# Patient Record
Sex: Female | Born: 1940 | Race: White | Hispanic: No | Marital: Married | Smoking: Former smoker
Health system: Southern US, Community
[De-identification: ages and names within clinical notes are randomized; demographics above are authoritative.]

## PROBLEM LIST (undated history)

## (undated) DIAGNOSIS — B059 Measles without complication: Secondary | ICD-10-CM

## (undated) DIAGNOSIS — E663 Overweight: Secondary | ICD-10-CM

## (undated) DIAGNOSIS — M542 Cervicalgia: Secondary | ICD-10-CM

## (undated) DIAGNOSIS — R55 Syncope and collapse: Secondary | ICD-10-CM

## (undated) DIAGNOSIS — N183 Chronic kidney disease, stage 3 (moderate): Secondary | ICD-10-CM

## (undated) DIAGNOSIS — D126 Benign neoplasm of colon, unspecified: Secondary | ICD-10-CM

## (undated) DIAGNOSIS — Z9889 Other specified postprocedural states: Secondary | ICD-10-CM

## (undated) DIAGNOSIS — M5030 Other cervical disc degeneration, unspecified cervical region: Secondary | ICD-10-CM

## (undated) DIAGNOSIS — R112 Nausea with vomiting, unspecified: Secondary | ICD-10-CM

## (undated) DIAGNOSIS — E785 Hyperlipidemia, unspecified: Secondary | ICD-10-CM

## (undated) DIAGNOSIS — Z0000 Encounter for general adult medical examination without abnormal findings: Secondary | ICD-10-CM

## (undated) DIAGNOSIS — N6019 Diffuse cystic mastopathy of unspecified breast: Secondary | ICD-10-CM

## (undated) DIAGNOSIS — I10 Essential (primary) hypertension: Secondary | ICD-10-CM

## (undated) DIAGNOSIS — C801 Malignant (primary) neoplasm, unspecified: Secondary | ICD-10-CM

## (undated) DIAGNOSIS — K219 Gastro-esophageal reflux disease without esophagitis: Secondary | ICD-10-CM

## (undated) DIAGNOSIS — H269 Unspecified cataract: Secondary | ICD-10-CM

## (undated) DIAGNOSIS — B2790 Infectious mononucleosis, unspecified without complication: Secondary | ICD-10-CM

## (undated) DIAGNOSIS — B019 Varicella without complication: Secondary | ICD-10-CM

## (undated) DIAGNOSIS — B269 Mumps without complication: Secondary | ICD-10-CM

## (undated) HISTORY — DX: Hyperlipidemia, unspecified: E78.5

## (undated) HISTORY — DX: Cervicalgia: M54.2

## (undated) HISTORY — DX: Encounter for general adult medical examination without abnormal findings: Z00.00

## (undated) HISTORY — DX: Syncope and collapse: R55

## (undated) HISTORY — DX: Essential (primary) hypertension: I10

## (undated) HISTORY — DX: Varicella without complication: B01.9

## (undated) HISTORY — PX: POLYPECTOMY: SHX149

## (undated) HISTORY — DX: Infectious mononucleosis, unspecified without complication: B27.90

## (undated) HISTORY — DX: Diffuse cystic mastopathy of unspecified breast: N60.19

## (undated) HISTORY — DX: Measles without complication: B05.9

## (undated) HISTORY — DX: Malignant (primary) neoplasm, unspecified: C80.1

## (undated) HISTORY — PX: ABDOMINAL HYSTERECTOMY: SHX81

## (undated) HISTORY — PX: COLONOSCOPY: SHX174

## (undated) HISTORY — PX: VAGINAL HYSTERECTOMY: SHX2639

## (undated) HISTORY — DX: Overweight: E66.3

## (undated) HISTORY — DX: Mumps without complication: B26.9

## (undated) HISTORY — DX: Benign neoplasm of colon, unspecified: D12.6

## (undated) HISTORY — DX: Gastro-esophageal reflux disease without esophagitis: K21.9

## (undated) HISTORY — DX: Unspecified cataract: H26.9

---

## 2010-07-29 HISTORY — PX: CATARACT EXTRACTION, BILATERAL: SHX1313

## 2010-11-27 HISTORY — PX: EYE SURGERY: SHX253

## 2011-07-30 LAB — HM COLONOSCOPY

## 2011-07-30 LAB — HM PAP SMEAR

## 2012-03-29 DIAGNOSIS — D126 Benign neoplasm of colon, unspecified: Secondary | ICD-10-CM

## 2012-03-29 HISTORY — DX: Benign neoplasm of colon, unspecified: D12.6

## 2012-10-27 LAB — HM MAMMOGRAPHY

## 2013-07-07 DIAGNOSIS — G245 Blepharospasm: Secondary | ICD-10-CM | POA: Diagnosis not present

## 2013-07-07 DIAGNOSIS — I10 Essential (primary) hypertension: Secondary | ICD-10-CM | POA: Diagnosis not present

## 2013-10-11 ENCOUNTER — Other Ambulatory Visit: Payer: Self-pay | Admitting: Family Medicine

## 2013-10-11 ENCOUNTER — Ambulatory Visit: Payer: Self-pay | Admitting: Family Medicine

## 2013-10-11 ENCOUNTER — Encounter: Payer: Self-pay | Admitting: Family Medicine

## 2013-10-11 ENCOUNTER — Ambulatory Visit (INDEPENDENT_AMBULATORY_CARE_PROVIDER_SITE_OTHER): Payer: Medicare Other | Admitting: Family Medicine

## 2013-10-11 ENCOUNTER — Ambulatory Visit (HOSPITAL_BASED_OUTPATIENT_CLINIC_OR_DEPARTMENT_OTHER)
Admission: RE | Admit: 2013-10-11 | Discharge: 2013-10-11 | Disposition: A | Discharge status: Home / Self Care | Payer: Medicare Other | Source: Ambulatory Visit | Attending: Family Medicine | Admitting: Family Medicine

## 2013-10-11 ENCOUNTER — Ambulatory Visit: Payer: Self-pay | Admitting: Podiatry

## 2013-10-11 VITALS — BP 140/62 | HR 87 | Temp 98.3°F | Ht 64.0 in

## 2013-10-11 DIAGNOSIS — H269 Unspecified cataract: Secondary | ICD-10-CM | POA: Insufficient documentation

## 2013-10-11 DIAGNOSIS — M25579 Pain in unspecified ankle and joints of unspecified foot: Secondary | ICD-10-CM

## 2013-10-11 DIAGNOSIS — K219 Gastro-esophageal reflux disease without esophagitis: Secondary | ICD-10-CM

## 2013-10-11 DIAGNOSIS — Z85828 Personal history of other malignant neoplasm of skin: Secondary | ICD-10-CM | POA: Insufficient documentation

## 2013-10-11 DIAGNOSIS — R209 Unspecified disturbances of skin sensation: Secondary | ICD-10-CM | POA: Diagnosis not present

## 2013-10-11 DIAGNOSIS — R609 Edema, unspecified: Secondary | ICD-10-CM | POA: Diagnosis not present

## 2013-10-11 DIAGNOSIS — I10 Essential (primary) hypertension: Secondary | ICD-10-CM

## 2013-10-11 DIAGNOSIS — C4491 Basal cell carcinoma of skin, unspecified: Secondary | ICD-10-CM

## 2013-10-11 DIAGNOSIS — E663 Overweight: Secondary | ICD-10-CM | POA: Insufficient documentation

## 2013-10-11 DIAGNOSIS — E559 Vitamin D deficiency, unspecified: Secondary | ICD-10-CM | POA: Diagnosis not present

## 2013-10-11 DIAGNOSIS — E785 Hyperlipidemia, unspecified: Secondary | ICD-10-CM

## 2013-10-11 DIAGNOSIS — Z1231 Encounter for screening mammogram for malignant neoplasm of breast: Secondary | ICD-10-CM

## 2013-10-11 DIAGNOSIS — C801 Malignant (primary) neoplasm, unspecified: Secondary | ICD-10-CM | POA: Insufficient documentation

## 2013-10-11 DIAGNOSIS — B2790 Infectious mononucleosis, unspecified without complication: Secondary | ICD-10-CM

## 2013-10-11 LAB — LIPID PANEL
Cholesterol: 222 mg/dL — ABNORMAL HIGH (ref 0–200)
HDL: 52 mg/dL (ref >39)
LDL Cholesterol: 128 mg/dL — ABNORMAL HIGH (ref 0–99)
Total CHOL/HDL Ratio: 4.3 Ratio
Triglycerides: 209 mg/dL — ABNORMAL HIGH (ref <150)
VLDL: 42 mg/dL — ABNORMAL HIGH (ref 0–40)

## 2013-10-11 LAB — RENAL FUNCTION PANEL
Albumin: 4 g/dL (ref 3.5–5.2)
BUN: 23 mg/dL (ref 6–23)
CO2: 32 mEq/L (ref 19–32)
Calcium: 9.9 mg/dL (ref 8.4–10.5)
Chloride: 104 mEq/L (ref 96–112)
Creat: 0.91 mg/dL (ref 0.50–1.10)
Glucose, Bld: 85 mg/dL (ref 70–99)
Phosphorus: 3.6 mg/dL (ref 2.3–4.6)
Potassium: 4.1 mEq/L (ref 3.5–5.3)
Sodium: 141 mEq/L (ref 135–145)

## 2013-10-11 LAB — CBC
HCT: 39.9 % (ref 36.0–46.0)
Hemoglobin: 13.6 g/dL (ref 12.0–15.0)
MCH: 29 pg (ref 26.0–34.0)
MCHC: 34.1 g/dL (ref 30.0–36.0)
MCV: 85.1 fL (ref 78.0–100.0)
Platelets: 273 10*3/uL (ref 150–400)
RBC: 4.69 MIL/uL (ref 3.87–5.11)
RDW: 13.8 % (ref 11.5–15.5)
WBC: 7.4 10*3/uL (ref 4.0–10.5)

## 2013-10-11 LAB — HEPATIC FUNCTION PANEL
ALT: 16 U/L (ref 0–35)
AST: 18 U/L (ref 0–37)
Albumin: 4 g/dL (ref 3.5–5.2)
Alkaline Phosphatase: 55 U/L (ref 39–117)
Bilirubin, Direct: 0.1 mg/dL (ref 0.0–0.3)
Indirect Bilirubin: 0.2 mg/dL (ref 0.2–1.2)
Total Bilirubin: 0.3 mg/dL (ref 0.2–1.2)
Total Protein: 6.5 g/dL (ref 6.0–8.3)

## 2013-10-11 NOTE — Progress Notes (Signed)
Pre visit review using our clinic review tool, if applicable. No additional management support is needed unless otherwise documented below in the visit note. 

## 2013-10-11 NOTE — Patient Instructions (Signed)
Probiotics such as Digestive Advantage  DASH Diet The DASH diet stands for "Dietary Approaches to Stop Hypertension." It is a healthy eating plan that has been shown to reduce high blood pressure (hypertension) in as little as 14 days, while also possibly providing other significant health benefits. These other health benefits include reducing the risk of breast cancer after menopause and reducing the risk of type 2 diabetes, heart disease, colon cancer, and stroke. Health benefits also include weight loss and slowing kidney failure in patients with chronic kidney disease.  DIET GUIDELINES  Limit salt (sodium). Your diet should contain less than 1500 mg of sodium daily.  Limit refined or processed carbohydrates. Your diet should include mostly whole grains. Desserts and added sugars should be used sparingly.  Include small amounts of heart-healthy fats. These types of fats include nuts, oils, and tub margarine. Limit saturated and trans fats. These fats have been shown to be harmful in the body. CHOOSING FOODS  The following food groups are based on a 2000 calorie diet. See your Registered Dietitian for individual calorie needs. Grains and Grain Products (6 to 8 servings daily)  Eat More Often: Whole-wheat bread, brown rice, whole-grain or wheat pasta, quinoa, popcorn without added fat or salt (air popped).  Eat Less Often: White bread, white pasta, white rice, cornbread. Vegetables (4 to 5 servings daily)  Eat More Often: Fresh, frozen, and canned vegetables. Vegetables may be raw, steamed, roasted, or grilled with a minimal amount of fat.  Eat Less Often/Avoid: Creamed or fried vegetables. Vegetables in a cheese sauce. Fruit (4 to 5 servings daily)  Eat More Often: All fresh, canned (in natural juice), or frozen fruits. Dried fruits without added sugar. One hundred percent fruit juice ( cup [237 mL] daily).  Eat Less Often: Dried fruits with added sugar. Canned fruit in light or heavy  syrup. [employer], Fish, and Poultry (2 servings or less daily. One serving is 3 to 4 oz [85-114 g]).  Eat More Often: Ninety percent or leaner ground beef, tenderloin, sirloin. Round cuts of beef, chicken breast, [place] breast. All fish. Grill, bake, or broil your meat. Nothing should be fried.  Eat Less Often/Avoid: Fatty cuts of meat, [place], or chicken leg, thigh, or wing. Fried cuts of meat or fish. Dairy (2 to 3 servings)  Eat More Often: Low-fat or fat-free milk, low-fat plain or light yogurt, reduced-fat or part-skim cheese.  Eat Less Often/Avoid: Milk (whole, 2%).Whole milk yogurt. Full-fat cheeses. Nuts, Seeds, and Legumes (4 to 5 servings per week)  Eat More Often: All without added salt.  Eat Less Often/Avoid: Salted nuts and seeds, canned beans with added salt. Fats and Sweets (limited)  Eat More Often: Vegetable oils, tub margarines without trans fats, sugar-free gelatin. Mayonnaise and salad dressings.  Eat Less Often/Avoid: Coconut oils, palm oils, butter, stick margarine, cream, half and half, cookies, candy, pie. FOR MORE INFORMATION The Dash Diet Eating Plan: www.dashdiet.org Document Released: 07/04/2011 Document Revised: 10/07/2011 Document Reviewed: 07/04/2011 [facility] Patient Information 2014 [place], [place].

## 2013-10-12 ENCOUNTER — Telehealth: Payer: Self-pay | Admitting: Family Medicine

## 2013-10-12 LAB — TSH: TSH: 3.802 u[IU]/mL (ref 0.350–4.500)

## 2013-10-12 LAB — VITAMIN D 25 HYDROXY (VIT D DEFICIENCY, FRACTURES): Vit D, 25-Hydroxy: 52 ng/mL (ref 30–89)

## 2013-10-12 NOTE — Telephone Encounter (Signed)
Requesting lab work and Naval architect

## 2013-10-12 NOTE — Telephone Encounter (Signed)
Patient informed of xray results and informed that as soon as Dr Charlett Blake looks at her labs we will let her know

## 2013-10-13 ENCOUNTER — Encounter: Payer: Self-pay | Admitting: Family Medicine

## 2013-10-13 DIAGNOSIS — E559 Vitamin D deficiency, unspecified: Secondary | ICD-10-CM | POA: Insufficient documentation

## 2013-10-13 DIAGNOSIS — M25579 Pain in unspecified ankle and joints of unspecified foot: Secondary | ICD-10-CM | POA: Insufficient documentation

## 2013-10-13 DIAGNOSIS — C4491 Basal cell carcinoma of skin, unspecified: Secondary | ICD-10-CM | POA: Insufficient documentation

## 2013-10-13 DIAGNOSIS — E785 Hyperlipidemia, unspecified: Secondary | ICD-10-CM

## 2013-10-13 HISTORY — DX: Hyperlipidemia, unspecified: E78.5

## 2013-10-13 NOTE — Assessment & Plan Note (Signed)
Long history of foot pain s/p injury in her 32s several weeks ago she hit her foot on a chair and has worsening foot pain since then. Xray does not show an acute fracture but with her pain and deformity she is referred to ortho for further consideration. Try ice and topical treatments prn

## 2013-10-13 NOTE — Progress Notes (Signed)
Patient ID: Laura [name], female   DOB: 1940-11-21, 73 y.o.   MRN: [id number] Laura [name] [id number] 1941/05/02 10/13/2013      Progress Note-Follow Up  Subjective  Chief Complaint  Chief Complaint  Patient presents with  . Establish Care    new patient  . Foot Injury    hurt right foot on chair- last night on top of foot    HPI  Patient is a 73 year old female in today for routine medical care. The patient who is here today complaining of increased right foot pain. She has chronic pain in right foot secondary to an injury and she was a young woman but she hit it on a chair recently and is much worse than usual. She had planned to have surgical correction in the past but then moved here to [place]. No other acute complaints. No chest pain, palpitations or shortness of breath. No GI or GU concerns at this time.  Past Medical History  Diagnosis Date  . Chicken pox as a child  . Measles as a child  . Mumps as a child  . Hypertension 20 yrs ago  . 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 Cataract   . 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 Hyperlipidemia   . Overweight   . GERD (gastroesophageal reflux disease)   . Other and unspecified hyperlipidemia 10/13/2013    Past Surgical History  Procedure Laterality Date  . Vaginal hysterectomy  73 yrs old    uterus removed  . Eye surgery  11-27-2010    cataract surgery in both eyes  . 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 Abdominal hysterectomy  29 yrs ago    heavy bleeding, ovaries left in place    Family History  Problem Relation Age of Onset  . Heart disease Father   . Heart attack Father 89  . Hypertension Father   . Dementia Maternal Grandmother   . Heart disease Maternal Grandfather   . Hyperlipidemia Paternal Grandmother   . Cancer Paternal Grandfather     stomach  . Heart attack Paternal Grandfather   . Dementia Mother     History   Social History  . Marital  Status: Married    Spouse Name: N/A    Number of Children: N/A  . Years of Education: N/A   Occupational History  . Not on file.   Social History Main Topics  . Smoking status: Former Smoker -- 1.00 packs/day for 16 years    Types: Cigarettes    Start date: 07/29/1972  . Smokeless tobacco: Never Used  . Alcohol Use: Yes     Comment: special occasions gllass of wine  . Drug Use: No  . Sexual Activity: Yes   Other Topics Concern  . Not on file   Social History Narrative  . No narrative on file    No current outpatient prescriptions on file prior to visit.   No current facility-administered medications on file prior to visit.    Allergies  Allergen Reactions  . Lisinopril     dizzy  . Statins     Muscle soreness   . Tetracyclines & Related     GI upset    Review of Systems  Review of Systems  Constitutional: Negative for fever and malaise/fatigue.  HENT: Negative for congestion.   Eyes: Negative for discharge.  Respiratory: Negative for shortness of breath.  Cardiovascular: Negative for chest pain, palpitations and leg swelling.  Gastrointestinal: Negative for nausea, abdominal pain and diarrhea.  Genitourinary: Negative for dysuria.  Musculoskeletal: Positive for joint pain. Negative for falls.       Right foot pain  Skin: Negative for rash.  Neurological: Negative for loss of consciousness and headaches.  Endo/Heme/Allergies: Negative for polydipsia.  Psychiatric/Behavioral: Negative for depression and suicidal ideas. The patient is not nervous/anxious and does not have insomnia.     Objective  BP 140/62  Pulse 87  Temp(Src) 98.3 F (36.8 C) (Oral)  Ht 5\' 4"  (1.626 m)  SpO2 94%  Physical Exam Physical Exam  Constitutional: She is oriented to person, place, and time and well-developed, well-nourished, and in no distress. No distress.  HENT:  Head: Normocephalic and atraumatic.  Eyes: Conjunctivae are normal.  Neck: Neck supple. No thyromegaly  present.  Cardiovascular: Normal rate, regular rhythm and normal heart sounds.   No murmur heard. Pulmonary/Chest: Effort normal and breath sounds normal. She has no wheezes.  Abdominal: Soft. Bowel sounds are normal. She exhibits no distension and no mass.  Musculoskeletal: She exhibits no edema.  2nd toe hyperflexed with lesion on top, scar at base of 4th toe on right  Lymphadenopathy:    She has no cervical adenopathy.  Neurological: She is alert and oriented to person, place, and time.  Skin: Skin is warm and dry. No rash noted. She is not diaphoretic.  Psychiatric: Memory, affect and judgment normal.    Lab Results  Component Value Date   TSH 3.802 10/11/2013   Lab Results  Component Value Date   WBC 7.4 10/11/2013   HGB 13.6 10/11/2013   HCT 39.9 10/11/2013   MCV 85.1 10/11/2013   PLT 273 10/11/2013   Lab Results  Component Value Date   CREATININE 0.91 10/11/2013   BUN 23 10/11/2013   NA 141 10/11/2013   K 4.1 10/11/2013   CL 104 10/11/2013   CO2 32 10/11/2013   Lab Results  Component Value Date   ALT 16 10/11/2013   AST 18 10/11/2013   ALKPHOS 55 10/11/2013   BILITOT 0.3 10/11/2013   Lab Results  Component Value Date   CHOL 222* 10/11/2013   Lab Results  Component Value Date   HDL 52 10/11/2013   Lab Results  Component Value Date   LDLCALC 128* 10/11/2013   Lab Results  Component Value Date   TRIG 209* 10/11/2013   Lab Results  Component Value Date   CHOLHDL 4.3 10/11/2013     Assessment & Plan  Pain in joint, ankle and foot Long history of foot pain s/p injury in her 20s several weeks ago she hit her foot on a chair and has worsening foot pain since then. Xray does not show an acute fracture but with her pain and deformity she is referred to ortho for further consideration. Try ice and topical treatments prn  Hypertension Well controlled, no changes to meds. Encouraged heart healthy diet such as the DASH diet and exercise as tolerated.   Recurrent BCC (basal  cell carcinoma) New to the area. Referred to dermatology for surveillance  GERD (gastroesophageal reflux disease) Encouraged daily probiotics and PPIs prn  Other and unspecified hyperlipidemia Avoid trans fats, patient intolerant to statins, will request old records and repeat testing.

## 2013-10-13 NOTE — Assessment & Plan Note (Signed)
New to the area. Referred to dermatology for surveillance

## 2013-10-13 NOTE — Assessment & Plan Note (Signed)
Well controlled, no changes to meds. Encouraged heart healthy diet such as the DASH diet and exercise as tolerated.  °

## 2013-10-13 NOTE — Assessment & Plan Note (Signed)
Encouraged daily probiotics and PPIs prn

## 2013-10-13 NOTE — Assessment & Plan Note (Signed)
Avoid trans fats, patient intolerant to statins, will request old records and repeat testing.

## 2013-10-14 ENCOUNTER — Ambulatory Visit: Payer: Self-pay | Admitting: Family Medicine

## 2013-10-26 DIAGNOSIS — G5760 Lesion of plantar nerve, unspecified lower limb: Secondary | ICD-10-CM | POA: Diagnosis not present

## 2013-11-29 ENCOUNTER — Ambulatory Visit (HOSPITAL_BASED_OUTPATIENT_CLINIC_OR_DEPARTMENT_OTHER)
Admission: RE | Admit: 2013-11-29 | Discharge: 2013-11-29 | Disposition: A | Discharge status: Home / Self Care | Payer: Medicare Other | Source: Ambulatory Visit | Attending: Family Medicine | Admitting: Family Medicine

## 2013-11-29 DIAGNOSIS — Z1231 Encounter for screening mammogram for malignant neoplasm of breast: Secondary | ICD-10-CM | POA: Diagnosis not present

## 2014-01-13 ENCOUNTER — Encounter: Payer: Self-pay | Admitting: Family Medicine

## 2014-01-13 ENCOUNTER — Ambulatory Visit (INDEPENDENT_AMBULATORY_CARE_PROVIDER_SITE_OTHER): Payer: Medicare Other | Admitting: Family Medicine

## 2014-01-13 VITALS — BP 132/68 | HR 74 | Temp 98.2°F | Ht 64.0 in

## 2014-01-13 DIAGNOSIS — H547 Unspecified visual loss: Secondary | ICD-10-CM

## 2014-01-13 DIAGNOSIS — E785 Hyperlipidemia, unspecified: Secondary | ICD-10-CM

## 2014-01-13 DIAGNOSIS — N951 Menopausal and female climacteric states: Secondary | ICD-10-CM

## 2014-01-13 DIAGNOSIS — E663 Overweight: Secondary | ICD-10-CM

## 2014-01-13 DIAGNOSIS — I10 Essential (primary) hypertension: Secondary | ICD-10-CM | POA: Diagnosis not present

## 2014-01-13 DIAGNOSIS — G245 Blepharospasm: Secondary | ICD-10-CM

## 2014-01-13 DIAGNOSIS — L578 Other skin changes due to chronic exposure to nonionizing radiation: Secondary | ICD-10-CM

## 2014-01-13 DIAGNOSIS — K219 Gastro-esophageal reflux disease without esophagitis: Secondary | ICD-10-CM

## 2014-01-13 DIAGNOSIS — R232 Flushing: Secondary | ICD-10-CM

## 2014-01-13 LAB — CBC
HCT: 42.6 % (ref 36.0–46.0)
Hemoglobin: 14.6 g/dL (ref 12.0–15.0)
MCH: 29.4 pg (ref 26.0–34.0)
MCHC: 34.3 g/dL (ref 30.0–36.0)
MCV: 85.9 fL (ref 78.0–100.0)
Platelets: 266 10*3/uL (ref 150–400)
RBC: 4.96 MIL/uL (ref 3.87–5.11)
RDW: 14 % (ref 11.5–15.5)
WBC: 6 10*3/uL (ref 4.0–10.5)

## 2014-01-13 NOTE — Progress Notes (Signed)
Pre visit review using our clinic review tool, if applicable. No additional management support is needed unless otherwise documented below in the visit note. 

## 2014-01-13 NOTE — Patient Instructions (Signed)
Seborrheic Keratosis Seborrheic keratosis is a common, noncancerous (benign) skin growth that can occur anywhere on the skin.It looks like "stuck-on," waxy, rough, tan, brown, or black spots on the skin. These skin growths can be flat or raised.They are often called "barnacles" because of their pasted-on appearance.Usually, these skin growths appear in adulthood, around age 72, and increase in number as you age. They may also develop during pregnancy or following estrogen therapy. Many people may only have one growth appear in their lifetime, while some people may develop many growths. CAUSES It is unknown what causes these skin growths, but they appear to run in families. SYMPTOMS Seborrheic keratosis is often located on the face, chest, shoulders, back, or other areas. These growths are:  Usually painless, but may become irritated and itchy.  Yellow, brown, black, or other colors.  Slightly raised or have a flat surface.  Sometimes rough or wart-like in texture.  Often waxy on the surface.  Round or oval-shaped.  Sometimes "stuck-on" in appearance.  Sometimes single, but there are usually many growths. Any growth that bleeds, itches on a regular basis, becomes inflamed, or becomes irritated needs to be evaluated by a skin specialist (dermatologist). DIAGNOSIS Diagnosis is mainly based on the way the growths appear. In some cases, it can be difficult to tell this type of skin growth from skin cancer. A skin growth tissue sample (biopsy) may be used to confirm the diagnosis. TREATMENT Most often, treatment is not needed because the skin growths are benign.If the skin growth is irritated easily by clothing or jewelry, causing it to scab or bleed, treatment may be recommended. Patients may also choose to have the growths removed because they do not like their appearance. Most commonly, these growths are treated with cryosurgery. In cryosurgery, liquid nitrogen is applied to "freeze" the  growth. The growth usually falls off within a matter of days. A blister may form and dry into a scab that will also fall off. After the growth or scab falls off, it may leave a dark or light spot on the skin. This color may fade over time, or it may remain permanent on the skin. HOME CARE INSTRUCTIONS If the skin growths are treated with cryosurgery, the treated area needs to be kept clean with water and soap. SEEK MEDICAL CARE IF:  You have questions about these growths or other skin problems.  You develop new symptoms, including:  A change in the appearance of the skin growth.  New growths.  Any bleeding, itching, or pain in the growths.  A skin growth that looks similar to seborrheic keratosis. Document Released: 08/17/2010 Document Revised: 10/07/2011 Document Reviewed: 08/17/2010 [employer] Patient Information 2015 [employer]. This information is not intended to replace advice given to you by your health care provider. Make sure you discuss any questions you have with your health care provider.  

## 2014-01-14 ENCOUNTER — Telehealth: Payer: Self-pay | Admitting: Family Medicine

## 2014-01-14 LAB — HEPATIC FUNCTION PANEL
ALT: 12 U/L (ref 0–35)
AST: 15 U/L (ref 0–37)
Albumin: 4.3 g/dL (ref 3.5–5.2)
Alkaline Phosphatase: 61 U/L (ref 39–117)
Bilirubin, Direct: 0.1 mg/dL (ref 0.0–0.3)
Indirect Bilirubin: 0.3 mg/dL (ref 0.2–1.2)
Total Bilirubin: 0.4 mg/dL (ref 0.2–1.2)
Total Protein: 6.8 g/dL (ref 6.0–8.3)

## 2014-01-14 LAB — TSH: TSH: 3.038 u[IU]/mL (ref 0.350–4.500)

## 2014-01-14 LAB — LIPID PANEL
Cholesterol: 208 mg/dL — ABNORMAL HIGH (ref 0–200)
HDL: 53 mg/dL (ref >39)
LDL Cholesterol: 128 mg/dL — ABNORMAL HIGH (ref 0–99)
Total CHOL/HDL Ratio: 3.9 Ratio
Triglycerides: 134 mg/dL (ref <150)
VLDL: 27 mg/dL (ref 0–40)

## 2014-01-14 LAB — RENAL FUNCTION PANEL
Albumin: 4.3 g/dL (ref 3.5–5.2)
BUN: 15 mg/dL (ref 6–23)
CO2: 28 mEq/L (ref 19–32)
Calcium: 9.5 mg/dL (ref 8.4–10.5)
Chloride: 102 mEq/L (ref 96–112)
Creat: 0.78 mg/dL (ref 0.50–1.10)
Glucose, Bld: 84 mg/dL (ref 70–99)
Phosphorus: 3.8 mg/dL (ref 2.3–4.6)
Potassium: 4.1 mEq/L (ref 3.5–5.3)
Sodium: 140 mEq/L (ref 135–145)

## 2014-01-14 NOTE — Telephone Encounter (Signed)
Relevant patient education assigned to patient using Emmi. ° °

## 2014-01-17 DIAGNOSIS — H35379 Puckering of macula, unspecified eye: Secondary | ICD-10-CM | POA: Diagnosis not present

## 2014-01-17 LAB — ESTROGENS, TOTAL: Estrogen: 119 pg/mL

## 2014-01-23 ENCOUNTER — Encounter: Payer: Self-pay | Admitting: Family Medicine

## 2014-01-23 DIAGNOSIS — I10 Essential (primary) hypertension: Secondary | ICD-10-CM | POA: Insufficient documentation

## 2014-01-23 DIAGNOSIS — H547 Unspecified visual loss: Secondary | ICD-10-CM | POA: Insufficient documentation

## 2014-01-23 DIAGNOSIS — G245 Blepharospasm: Secondary | ICD-10-CM | POA: Insufficient documentation

## 2014-01-23 DIAGNOSIS — R232 Flushing: Secondary | ICD-10-CM | POA: Insufficient documentation

## 2014-01-23 DIAGNOSIS — L578 Other skin changes due to chronic exposure to nonionizing radiation: Secondary | ICD-10-CM | POA: Insufficient documentation

## 2014-01-23 NOTE — Assessment & Plan Note (Signed)
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

## 2014-01-23 NOTE — Assessment & Plan Note (Signed)
Encouraged heart healthy diet, increase exercise, avoid trans fats, consider a krill oil cap daily 

## 2014-01-23 NOTE — Assessment & Plan Note (Signed)
Avoid offending foods, start probiotics. Do not eat large meals in late evening and consider raising head of bed.  

## 2014-01-23 NOTE — Assessment & Plan Note (Signed)
Encouraged good sleep, heart healthy diet with small, frequent meals, lean proteins. Increase hydraiton, minimize caffeine and alcohol consider icool.

## 2014-01-23 NOTE — Assessment & Plan Note (Signed)
Well controlled, no changes to meds. Encouraged heart healthy diet such as the DASH diet and exercise as tolerated.  °

## 2014-01-23 NOTE — Progress Notes (Signed)
Patient ID: Laura [name], female   DOB: 06-Jul-1941, 73 y.o.   MRN: [id number] Celica Kotowski [id number] 19-Aug-1940 01/23/2014      Progress Note-Follow Up  Subjective  Chief Complaint  Chief Complaint  Patient presents with  . Follow-up    3 month    HPI  Patient is a 73 year old female in today for routine medical care. Patient is in today for followup. Her greatest complaint is hot flashes. She does note she took estrogen and chills she was 71 and they controlled her hot flashes well but now she's having 2-3 day period they tend to happen about the same time each evening usually in the afternoon and then again around 8 PM. She has some nausea. She notes a little more frequent lately as she has been dieting and has lost 14 pounds. No other recent illness. He is frustrated with visual loss and blepharospasm. Denies CP/palp/SOB/HA/congestion/fevers/GI or GU c/o. Taking meds as prescribed  Past Medical History  Diagnosis Date  . Chicken pox as a child  . Measles as a child  . Mumps as a child  . Hypertension 20 yrs ago  . 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 Cataract   . 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 Hyperlipidemia   . Overweight(278.02)   . GERD (gastroesophageal reflux disease)   . Other and unspecified hyperlipidemia 10/13/2013    Past Surgical History  Procedure Laterality Date  . Vaginal hysterectomy  74 yrs old    uterus removed  . Eye surgery  11-27-2010    cataract surgery in both eyes  . 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 Abdominal hysterectomy  29 yrs ago    heavy bleeding, ovaries left in place    Family History  Problem Relation Age of Onset  . Heart disease Father   . Heart attack Father 78  . Hypertension Father   . Dementia Maternal Grandmother   . Heart disease Maternal Grandfather   . Hyperlipidemia Paternal Grandmother   . Cancer Paternal Grandfather     stomach  . Heart attack Paternal  Grandfather   . Dementia Mother     History   Social History  . Marital Status: Married    Spouse Name: N/A    Number of Children: N/A  . Years of Education: N/A   Occupational History  . Not on file.   Social History Main Topics  . Smoking status: Former Smoker -- 1.00 packs/day for 16 years    Types: Cigarettes    Start date: 07/29/1972  . Smokeless tobacco: Never Used  . Alcohol Use: Yes     Comment: special occasions gllass of wine  . Drug Use: No  . Sexual Activity: Yes   Other Topics Concern  . Not on file   Social History Narrative  . No narrative on file    Current Outpatient Prescriptions on File Prior to Visit  Medication Sig Dispense Refill  . cholecalciferol (VITAMIN D) 1000 UNITS tablet Take 2,000 Units by mouth daily.      . Fiber, Guar Gum, CHEW Chew by mouth as needed.      Marland Kitchen losartan-hydrochlorothiazide (HYZAAR) 50-12.5 MG per tablet Take 1 tablet by mouth daily.       No current facility-administered medications on file prior to visit.    Allergies  Allergen Reactions  . Lisinopril     dizzy  .  Statins     Muscle soreness   . Tetracyclines & Related     GI upset    Review of Systems  Review of Systems  Constitutional: Negative for fever and malaise/fatigue.  HENT: Negative for congestion.   Eyes: Negative for discharge.  Respiratory: Negative for shortness of breath.   Cardiovascular: Negative for chest pain, palpitations and leg swelling.  Gastrointestinal: Negative for nausea, abdominal pain and diarrhea.  Genitourinary: Negative for dysuria.  Musculoskeletal: Negative for falls.  Skin: Negative for rash.  Neurological: Negative for loss of consciousness and headaches.  Endo/Heme/Allergies: Negative for polydipsia.  Psychiatric/Behavioral: Negative for depression and suicidal ideas. The patient is not nervous/anxious and does not have insomnia.     Objective  BP 132/68  Pulse 74  Temp(Src) 98.2 F (36.8 C) (Oral)  Ht 5\' 4"   (1.626 m)  SpO2 96%  Physical Exam  Physical Exam  Constitutional: She is oriented to person, place, and time and well-developed, well-nourished, and in no distress. No distress.  HENT:  Head: Normocephalic and atraumatic.  Eyes: Conjunctivae are normal.  Neck: Neck supple. No thyromegaly present.  Cardiovascular: Normal rate, regular rhythm and normal heart sounds.   No murmur heard. Pulmonary/Chest: Effort normal and breath sounds normal. She has no wheezes.  Abdominal: She exhibits no distension and no mass.  Musculoskeletal: She exhibits no edema.  Lymphadenopathy:    She has no cervical adenopathy.  Neurological: She is alert and oriented to person, place, and time.  Skin: Skin is warm and dry. No rash noted. She is not diaphoretic.  Psychiatric: Memory, affect and judgment normal.    Lab Results  Component Value Date   TSH 3.038 01/13/2014   Lab Results  Component Value Date   WBC 6.0 01/13/2014   HGB 14.6 01/13/2014   HCT 42.6 01/13/2014   MCV 85.9 01/13/2014   PLT 266 01/13/2014   Lab Results  Component Value Date   CREATININE 0.78 01/13/2014   BUN 15 01/13/2014   NA 140 01/13/2014   K 4.1 01/13/2014   CL 102 01/13/2014   CO2 28 01/13/2014   Lab Results  Component Value Date   ALT 12 01/13/2014   AST 15 01/13/2014   ALKPHOS 61 01/13/2014   BILITOT 0.4 01/13/2014   Lab Results  Component Value Date   CHOL 208* 01/13/2014   Lab Results  Component Value Date   HDL 53 01/13/2014   Lab Results  Component Value Date   LDLCALC 128* 01/13/2014   Lab Results  Component Value Date   TRIG 134 01/13/2014   Lab Results  Component Value Date   CHOLHDL 3.9 01/13/2014     Assessment & Plan  Hypertension Well controlled, no changes to meds. Encouraged heart healthy diet such as the DASH diet and exercise as tolerated.   Overweight Encouraged DASH diet, decrease po intake and increase exercise as tolerated. Needs 7-8 hours of sleep nightly. Avoid trans fats, eat small,  frequent meals every 4-5 hours with lean proteins, complex carbs and healthy fats. 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  GERD (gastroesophageal reflux disease) Avoid offending foods, start probiotics. Do not eat large meals in late evening and consider raising head of bed.   Hot flashes Encouraged good sleep, heart healthy diet  with small, frequent meals, lean proteins. Increase hydraiton, minimize caffeine and alcohol consider icool.

## 2014-01-23 NOTE — Assessment & Plan Note (Signed)
Encouraged DASH diet, decrease po intake and increase exercise as tolerated. Needs 7-8 hours of sleep nightly. Avoid trans fats, eat small, frequent meals every 4-5 hours with lean proteins, complex carbs and healthy fats. Minimize simple carbs 

## 2014-01-23 NOTE — Assessment & Plan Note (Signed)
Referred to dermatology for further consideration.  

## 2014-02-02 DIAGNOSIS — D235 Other benign neoplasm of skin of trunk: Secondary | ICD-10-CM | POA: Diagnosis not present

## 2014-02-23 ENCOUNTER — Telehealth: Payer: Self-pay | Admitting: *Deleted

## 2014-02-23 NOTE — Telephone Encounter (Signed)
Pt left message requesting estrogen results.  Please advise.

## 2014-02-23 NOTE — Telephone Encounter (Signed)
Estrogen level 119, non diagnostic, could be mid cycle or post menopause.

## 2014-02-25 NOTE — Telephone Encounter (Signed)
Left detailed message and to call if further questions.

## 2014-03-14 DIAGNOSIS — H11439 Conjunctival hyperemia, unspecified eye: Secondary | ICD-10-CM | POA: Diagnosis not present

## 2014-03-14 DIAGNOSIS — G245 Blepharospasm: Secondary | ICD-10-CM | POA: Diagnosis not present

## 2014-04-11 DIAGNOSIS — G245 Blepharospasm: Secondary | ICD-10-CM | POA: Diagnosis not present

## 2014-04-11 DIAGNOSIS — H02429 Myogenic ptosis of unspecified eyelid: Secondary | ICD-10-CM | POA: Diagnosis not present

## 2014-04-14 ENCOUNTER — Ambulatory Visit (INDEPENDENT_AMBULATORY_CARE_PROVIDER_SITE_OTHER): Payer: Medicare Other | Admitting: Family Medicine

## 2014-04-14 ENCOUNTER — Encounter: Payer: Self-pay | Admitting: Family Medicine

## 2014-04-14 VITALS — BP 130/79 | HR 85 | Temp 98.3°F | Ht 64.0 in

## 2014-04-14 DIAGNOSIS — N951 Menopausal and female climacteric states: Secondary | ICD-10-CM

## 2014-04-14 DIAGNOSIS — E785 Hyperlipidemia, unspecified: Secondary | ICD-10-CM

## 2014-04-14 DIAGNOSIS — K219 Gastro-esophageal reflux disease without esophagitis: Secondary | ICD-10-CM | POA: Diagnosis not present

## 2014-04-14 DIAGNOSIS — R232 Flushing: Secondary | ICD-10-CM

## 2014-04-14 DIAGNOSIS — E663 Overweight: Secondary | ICD-10-CM | POA: Diagnosis not present

## 2014-04-14 DIAGNOSIS — I10 Essential (primary) hypertension: Secondary | ICD-10-CM | POA: Diagnosis not present

## 2014-04-14 NOTE — Assessment & Plan Note (Signed)
Occuring at 8:45 am. 9:30 pm and midnight, might have another one or two during the day, try more protein based snacks, she not eaten in quite some time when occurs

## 2014-04-14 NOTE — Progress Notes (Signed)
Pre visit review using our clinic review tool, if applicable. No additional management support is needed unless otherwise documented below in the visit note. 

## 2014-04-14 NOTE — Progress Notes (Signed)
Patient ID: Laura [name], female   DOB: 1940-11-11, 73 y.o.   MRN: [id number] Laura [name] [id number] [birth year] 04/14/2014      Progress Note-Follow Up  Subjective  Chief Complaint  Chief Complaint  Patient presents with  . Follow-up    3 month    HPI  Patient is a 73 year old female in today for routine medical care. Patient is followup. Overall feeling well. Has been trying to lose weight for the last 2 months and says she's lost 18 pounds. She will not way here but she notes she weighed elsewhere discovered she had gained weight. She has been struggling with some right foot pain since April but is being care for at [place]. 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 Denies CP/palp/SOB/HA/congestion/fevers/GI or GU c/o. Taking meds as prescribed  Past Medical History  Diagnosis Date  . Chicken pox as a child  . Measles as a child  . Mumps as a child  . Hypertension 20 yrs ago  . 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 Cataract   . 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 Hyperlipidemia   . Overweight(278.02)   . GERD (gastroesophageal reflux disease)   . Other and unspecified hyperlipidemia 10/13/2013    Past Surgical History  Procedure Laterality Date  . Vaginal hysterectomy  73 yrs old    uterus removed  . Eye surgery  11-27-2010    cataract surgery in both eyes  . 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 Abdominal hysterectomy  29 yrs ago    heavy bleeding, ovaries left in place    Family History  Problem Relation Age of Onset  . Heart disease Father   . Heart attack Father 81  . Hypertension Father   . Dementia Maternal Grandmother   . Heart disease Maternal Grandfather   . Hyperlipidemia Paternal Grandmother   . Cancer Paternal Grandfather     stomach  . Heart attack Paternal Grandfather   . Dementia Mother     History   Social History  . Marital  Status: Married    Spouse Name: N/A    Number of Children: N/A  . Years of Education: N/A   Occupational History  . Not on file.   Social History Main Topics  . Smoking status: Former Smoker -- 1.00 packs/day for 16 years    Types: Cigarettes    Start date: 07/29/1972  . Smokeless tobacco: Never Used  . Alcohol Use: Yes     Comment: special occasions gllass of wine  . Drug Use: No  . Sexual Activity: Yes   Other Topics Concern  . Not on file   Social History Narrative  . No narrative on file    Current Outpatient Prescriptions on File Prior to Visit  Medication Sig Dispense Refill  . cholecalciferol (VITAMIN D) 1000 UNITS tablet Take 2,000 Units by mouth daily.      . Fiber, Guar Gum, CHEW Chew by mouth as needed.      Marland Kitchen losartan-hydrochlorothiazide (HYZAAR) 50-12.5 MG per tablet Take 1 tablet by mouth daily.       No current facility-administered medications on file prior to visit.    Allergies  Allergen Reactions  . Lisinopril     dizzy  . Statins     Muscle soreness   . Tetracyclines & Related  GI upset    Review of Systems  Review of Systems  Constitutional: Negative for fever and malaise/fatigue.  HENT: Negative for congestion.   Eyes: Negative for discharge.  Respiratory: Negative for shortness of breath.   Cardiovascular: Negative for chest pain, palpitations and leg swelling.  Gastrointestinal: Negative for nausea, abdominal pain and diarrhea.  Genitourinary: Negative for dysuria.  Musculoskeletal: Positive for joint pain. Negative for falls.       Right foot pain, responded to steroid injection in the spring but is beginning to hurt a gain. Sees [facility] [facility]  Skin: Negative for rash.  Neurological: Negative for loss of consciousness and headaches.  Endo/Heme/Allergies: Negative for polydipsia.  Psychiatric/Behavioral: Negative for depression and suicidal ideas. The patient is not nervous/anxious and does not have insomnia.     Objective  BP  130/79  Pulse 85  Temp(Src) 98.3 F (36.8 C) (Oral)  Ht 5\' 4"  (1.626 m)  SpO2 99%  Physical Exam  Physical Exam  Constitutional: She is oriented to person, place, and time and well-developed, well-nourished, and in no distress. No distress.  HENT:  Head: Normocephalic and atraumatic.  Eyes: Conjunctivae are normal.  Neck: Neck supple. No thyromegaly present.  Cardiovascular: Normal rate, regular rhythm and normal heart sounds.   No murmur heard. Pulmonary/Chest: Effort normal and breath sounds normal. She has no wheezes.  Abdominal: She exhibits no distension and no mass.  Musculoskeletal: She exhibits no edema.  Lymphadenopathy:    She has no cervical adenopathy.  Neurological: She is alert and oriented to person, place, and time.  Skin: Skin is warm and dry. No rash noted. She is not diaphoretic.  Psychiatric: Memory, affect and judgment normal.    Lab Results  Component Value Date   TSH 3.038 01/13/2014   Lab Results  Component Value Date   WBC 6.0 01/13/2014   HGB 14.6 01/13/2014   HCT 42.6 01/13/2014   MCV 85.9 01/13/2014   PLT 266 01/13/2014   Lab Results  Component Value Date   CREATININE 0.78 01/13/2014   BUN 15 01/13/2014   NA 140 01/13/2014   K 4.1 01/13/2014   CL 102 01/13/2014   CO2 28 01/13/2014   Lab Results  Component Value Date   ALT 12 01/13/2014   AST 15 01/13/2014   ALKPHOS 61 01/13/2014   BILITOT 0.4 01/13/2014   Lab Results  Component Value Date   CHOL 208* 01/13/2014   Lab Results  Component Value Date   HDL 53 01/13/2014   Lab Results  Component Value Date   LDLCALC 128* 01/13/2014   Lab Results  Component Value Date   TRIG 134 01/13/2014   Lab Results  Component Value Date   CHOLHDL 3.9 01/13/2014     Assessment & Plan  Hot flashes Occuring at 8:45 am. 9:30 pm and midnight, might have another one or two during the day, try more protein based snacks, she not eaten in quite some time when occurs  Overweight Encouraged DASH diet,  decrease po intake and increase exercise as tolerated. Needs 7-8 hours of sleep nightly. Avoid trans fats, eat small, frequent meals every 4-5 hours with lean proteins, complex carbs and healthy fats. Minimize simple carbs, GMO foods.  Hypertension Well controlled, no changes to meds. Encouraged heart healthy diet such as the DASH diet and exercise as tolerated.   GERD (gastroesophageal reflux disease) Avoid offending foods, start probiotics. Do not eat large meals in late evening and consider raising head of bed.  Other and unspecified hyperlipidemia Encouraged heart healthy diet, increase exercise, avoid trans fats, consider a krill oil cap daily

## 2014-04-14 NOTE — Patient Instructions (Signed)
ICool daily, [employer], [employer]  Add a protein based snack day and night  Cholesterol Cholesterol is a white, waxy, fat-like substance needed by your body in small amounts. The liver makes all the cholesterol you need. Cholesterol is carried from the liver by the blood through the blood vessels. Deposits of cholesterol (plaque) may build up on blood vessel walls. These make the arteries narrower and stiffer. Cholesterol plaques increase the risk for heart attack and stroke.  You cannot feel your cholesterol level even if it is very high. The only way to know it is high is with a blood test. Once you know your cholesterol levels, you should keep a record of the test results. Work with your health care provider to keep your levels in the desired range.  WHAT DO THE RESULTS MEAN?  Total cholesterol is a rough measure of all the cholesterol in your blood.   LDL is the so-called bad cholesterol. This is the type that deposits cholesterol in the walls of the arteries. You want this level to be low.   HDL is the good cholesterol because it cleans the arteries and carries the LDL away. You want this level to be high.  Triglycerides are fat that the body can either burn for energy or store. High levels are closely linked to heart disease.  WHAT ARE THE DESIRED LEVELS OF CHOLESTEROL?  Total cholesterol below 200.   LDL below 100 for people at risk, below 70 for those at very high risk.   HDL above 50 is good, above 60 is best.   Triglycerides below 150.  HOW CAN I LOWER MY CHOLESTEROL?  Diet. Follow your diet programs as directed by your health care provider.   Choose fish or white meat chicken and [place], roasted or baked. Limit fatty cuts of red meat, fried foods, and processed meats, such as sausage and lunch meats.   Eat lots of fresh fruits and vegetables.  Choose whole grains, beans, pasta, potatoes, and cereals.   Use only small amounts of olive, corn, or canola oils.    Avoid butter, mayonnaise, shortening, or palm kernel oils.  Avoid foods with trans fats.   Drink skim or nonfat milk and eat low-fat or nonfat yogurt and cheeses. Avoid whole milk, cream, ice cream, egg yolks, and full-fat cheeses.   Healthy desserts include angel food cake, ginger snaps, animal crackers, hard candy, popsicles, and low-fat or nonfat frozen yogurt. Avoid pastries, cakes, pies, and cookies.   Exercise. Follow your exercise programs as directed by your health care provider.   A regular program helps decrease LDL and raise HDL.   A regular program helps with weight control.   Do things that increase your activity level like gardening, walking, or taking the stairs. Ask your health care provider about how you can be more active in your daily life.   Medicine. Take medicine only as directed by your health care provider.   Medicine may be prescribed by your health care provider to help lower cholesterol and decrease the risk for heart disease.   If you have several risk factors, you may need medicine even if your levels are normal. Document Released: 04/09/2001 Document Revised: 11/29/2013 Document Reviewed: 04/28/2013 [facility] Patient Information 2015 [place], [place]. This information is not intended to replace advice given to you by your health care provider. Make sure you discuss any questions you have with your health care provider.

## 2014-04-17 NOTE — Assessment & Plan Note (Signed)
Encouraged DASH diet, decrease po intake and increase exercise as tolerated. Needs 7-8 hours of sleep nightly. Avoid trans fats, eat small, frequent meals every 4-5 hours with lean proteins, complex carbs and healthy fats. Minimize simple carbs, GMO foods. 

## 2014-04-17 NOTE — Assessment & Plan Note (Signed)
Avoid offending foods, start probiotics. Do not eat large meals in late evening and consider raising head of bed.  

## 2014-04-17 NOTE — Assessment & Plan Note (Signed)
Encouraged heart healthy diet, increase exercise, avoid trans fats, consider a krill oil cap daily 

## 2014-04-17 NOTE — Assessment & Plan Note (Signed)
Well controlled, no changes to meds. Encouraged heart healthy diet such as the DASH diet and exercise as tolerated.  °

## 2014-04-18 ENCOUNTER — Encounter: Payer: Self-pay | Admitting: Family Medicine

## 2014-04-18 ENCOUNTER — Ambulatory Visit (INDEPENDENT_AMBULATORY_CARE_PROVIDER_SITE_OTHER): Payer: Medicare Other | Admitting: Family Medicine

## 2014-04-18 VITALS — BP 147/81 | HR 71 | Temp 97.6°F

## 2014-04-18 DIAGNOSIS — J069 Acute upper respiratory infection, unspecified: Secondary | ICD-10-CM | POA: Diagnosis not present

## 2014-04-18 NOTE — Patient Instructions (Signed)
Buy OTC generic allegra 60 mg tab twice a day as needed. Also either robitussin DM or mucinex DM can be taken as needed as well. Continue ibuprofen and acetaminophen as needed for headache.

## 2014-04-18 NOTE — Progress Notes (Signed)
Pre visit review using our clinic review tool, if applicable. No additional management support is needed unless otherwise documented below in the visit note. 

## 2014-04-18 NOTE — Progress Notes (Signed)
OFFICE NOTE  04/18/2014  CC: URI sx's.  HPI: Patient is a 73 y.o. Caucasian female who is here for HA, facial/nasal drainage, some PND cough. No fevers.  HA is peri-orbital/symmetric.  Had some pain in upper teeth region this morning. Taking lots of ibuprofen.  Uses no nasal sprays, no OTC meds tried.  Pertinent PMH:  Past medical, surgical, social, and family history reviewed and no changes are noted since last office visit.  MEDS:  Outpatient Prescriptions Prior to Visit  Medication Sig Dispense Refill  . cholecalciferol (VITAMIN D) 1000 UNITS tablet Take 2,000 Units by mouth daily.      . Fiber, Guar Gum, CHEW Chew by mouth as needed.      Marland Kitchen losartan-hydrochlorothiazide (HYZAAR) 50-12.5 MG per tablet Take 1 tablet by mouth daily.       No facility-administered medications prior to visit.    PE: Blood pressure 147/81, pulse 71, temperature 97.6 F (36.4 C), temperature source Oral, weight 0 lb (0 kg), SpO2 97.00%. BP recheck w/manual cuff right arm 132/82 VS: noted--normal. Gen: alert, NAD, NONTOXIC APPEARING. HEENT: eyes without injection, drainage, or swelling.  Ears: EACs clear, TMs with normal light reflex and landmarks.  Nose: Clear rhinorrhea, with some dried, crusty exudate adherent to mildly injected mucosa.  No purulent d/c.  No paranasal sinus TTP.  No facial swelling.  Throat and mouth without focal lesion.  No pharyngial swelling, erythema, or exudate.   Neck: supple, no LAD.   LUNGS: CTA bilat, nonlabored resps.   CV: RRR, no m/r/g. EXT: no c/c/e SKIN: no rash  IMPRESSION AND PLAN:  Viral URI, symptomatic care discussed. Avoid decongestants due to HTN. Buy OTC generic allegra 60 mg tab twice a day as needed. Also either robitussin DM or mucinex DM can be taken as needed as well. Continue ibuprofen and acetaminophen as needed for headache.  An After Visit Summary was printed and given to the patient.  FOLLOW UP: prn

## 2014-04-29 ENCOUNTER — Ambulatory Visit (INDEPENDENT_AMBULATORY_CARE_PROVIDER_SITE_OTHER): Payer: Medicare Other | Admitting: Family Medicine

## 2014-04-29 ENCOUNTER — Encounter: Payer: Self-pay | Admitting: Family Medicine

## 2014-04-29 VITALS — BP 137/66 | HR 71 | Temp 98.1°F | Ht 64.0 in

## 2014-04-29 DIAGNOSIS — N951 Menopausal and female climacteric states: Secondary | ICD-10-CM

## 2014-04-29 DIAGNOSIS — R232 Flushing: Secondary | ICD-10-CM

## 2014-04-29 DIAGNOSIS — Z23 Encounter for immunization: Secondary | ICD-10-CM

## 2014-04-29 DIAGNOSIS — N649 Disorder of breast, unspecified: Secondary | ICD-10-CM

## 2014-04-29 DIAGNOSIS — I10 Essential (primary) hypertension: Secondary | ICD-10-CM | POA: Diagnosis not present

## 2014-04-29 NOTE — Progress Notes (Signed)
Pre visit review using our clinic review tool, if applicable. No additional management support is needed unless otherwise documented below in the visit note. 

## 2014-04-29 NOTE — Patient Instructions (Signed)
Fibrocystic Breast Changes Fibrocystic breast changes occur when breast ducts become blocked, causing painful, fluid-filled lumps (cysts) to form in the breast. This is a common condition that is noncancerous (benign). It occurs when women go through hormonal changes during their menstrual cycle. Fibrocystic breast changes can affect one or both breasts. CAUSES  The exact cause of fibrocystic breast changes is not known, but it may be related to the female hormones estrogen and progesterone. Family traits that get passed from parent to child (genetics) may also be a factor in some cases. SIGNS AND SYMPTOMS   Tenderness, mild discomfort, or pain.   Swelling.   Ropelike feeling when touching the breast.   Lumpy breast, one or both sides.   Changes in breast size, especially before (larger) and after (smaller) the menstrual period.   Green or dark brown nipple discharge (not blood).  Symptoms are usually worse before menstrual periods start and get better toward the end of the menstrual period.  DIAGNOSIS  To make a diagnosis, your health care provider will ask you questions and perform a physical exam of your breasts. The health care provider may recommend other tests that can examine inside your breasts, such as:  A breast X-ray (mammogram).   Ultrasonography.  An MRI.  If something more than fibrocystic breast changes is suspected, your health care provider may take a breast tissue sample (breast biopsy) to examine. TREATMENT  Often, treatment is not needed. Your health care provider may recommend over-the-counter pain relievers to help lessen pain or discomfort caused by the fibrocystic breast changes. You may also be asked to change your diet to limit or stop eating foods or drinking beverages that contain caffeine. Foods and beverages that contain caffeine include chocolate, soda, coffee, and tea. Reducing sugar and fat in your diet may also help. Your health care provider may  also recommend:  Fine needle aspiration to remove fluid from a cyst that is causing pain.   Surgery to remove a large, persistent, and tender cyst. HOME CARE INSTRUCTIONS   Examine your breasts after every menstrual period. If you do not have menstrual periods, check your breasts the first day of every month. Feel for changes, such as more tenderness, a new growth, a change in breast size, or a change in a lump that has always been there.   Only take over-the-counter or prescription medicine as directed by your health care provider.   Wear a well-fitted support or sports bra, especially when exercising.   Decrease or avoid caffeine, fat, and sugar in your diet as directed by your health care provider.  SEEK MEDICAL CARE IF:   You have fluid leaking (discharge) from your nipples, especially bloody discharge.   You have new lumps or bumps in the breast.   Your breast or breasts become enlarged, red, and painful.   You have areas of your breast that pucker in.   Your nipples appear flat or indented.  Document Released: 05/01/2006 Document Revised: 07/20/2013 Document Reviewed: 01/03/2013 [employer] Patient Information 2015 [employer]. This information is not intended to replace advice given to you by your health care provider. Make sure you discuss any questions you have with your health care provider.  

## 2014-05-01 ENCOUNTER — Encounter: Payer: Self-pay | Admitting: Family Medicine

## 2014-05-01 DIAGNOSIS — N6019 Diffuse cystic mastopathy of unspecified breast: Secondary | ICD-10-CM | POA: Insufficient documentation

## 2014-05-01 HISTORY — DX: Diffuse cystic mastopathy of unspecified breast: N60.19

## 2014-05-01 NOTE — Assessment & Plan Note (Signed)
stable °

## 2014-05-01 NOTE — Progress Notes (Signed)
Patient ID: Laura [name], female   DOB: 06/28/1941, 73 y.o.   MRN: [id number] Laura [name] [id number] 17-Mar-1941 05/01/2014      Progress Note-Follow Up  Subjective  Chief Complaint  Chief Complaint  Patient presents with  . Breast Mass    right  . Injections    prevnar and flu    HPI  Patient is a 73 year old female in today for routine medical care. Noting a lesion in her right breast for about a week to 2. She notes it was not warm and there was no trauma. It was not tender. There was no discharge. She reports otherwise she feels well. She denies anorexia, chest pain, palpitations or shortness of breath. She is a long history of hot flashes they are not worse than her baseline and were actually somewhat improved while down in [place].  Past Medical History  Diagnosis Date  . Chicken pox as a child  . Measles as a child  . Mumps as a child  . Hypertension 20 yrs ago  . 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 Cataract   . 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 Hyperlipidemia   . Overweight(278.02)   . GERD (gastroesophageal reflux disease)   . Other and unspecified hyperlipidemia 10/13/2013    Past Surgical History  Procedure Laterality Date  . Vaginal hysterectomy  73 yrs old    uterus removed  . Eye surgery  11-27-2010    cataract surgery in both eyes  . 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 Abdominal hysterectomy  29 yrs ago    heavy bleeding, ovaries left in place    Family History  Problem Relation Age of Onset  . Heart disease Father   . Heart attack Father 80  . Hypertension Father   . Dementia Maternal Grandmother   . Heart disease Maternal Grandfather   . Hyperlipidemia Paternal Grandmother   . Cancer Paternal Grandfather     stomach  . Heart attack Paternal Grandfather   . Dementia Mother     History   Social History  . Marital Status: Married    Spouse Name: N/A    Number of Children: N/A  . Years  of Education: N/A   Occupational History  . Not on file.   Social History Main Topics  . Smoking status: Former Smoker -- 1.00 packs/day for 16 years    Types: Cigarettes    Start date: 07/29/1972  . Smokeless tobacco: Never Used  . Alcohol Use: Yes     Comment: special occasions gllass of wine  . Drug Use: No  . Sexual Activity: Yes   Other Topics Concern  . Not on file   Social History Narrative  . No narrative on file    Current Outpatient Prescriptions on File Prior to Visit  Medication Sig Dispense Refill  . cholecalciferol (VITAMIN D) 1000 UNITS tablet Take 2,000 Units by mouth daily.      . Fiber, Guar Gum, CHEW Chew by mouth as needed.      Marland Kitchen losartan-hydrochlorothiazide (HYZAAR) 50-12.5 MG per tablet Take 1 tablet by mouth daily.       No current facility-administered medications on file prior to visit.    Allergies  Allergen Reactions  . Lisinopril     dizzy  . Statins     Muscle soreness   . Tetracyclines & Related  GI upset    Review of Systems  Review of Systems  Constitutional: Negative for fever and malaise/fatigue.  HENT: Negative for congestion.   Eyes: Negative for discharge.  Respiratory: Negative for shortness of breath.   Cardiovascular: Negative for chest pain, palpitations and leg swelling.  Gastrointestinal: Negative for nausea, abdominal pain and diarrhea.  Genitourinary: Negative for dysuria.  Musculoskeletal: Negative for falls.  Skin: Negative for rash.  Neurological: Negative for loss of consciousness and headaches.  Endo/Heme/Allergies: Negative for polydipsia.  Psychiatric/Behavioral: Negative for depression and suicidal ideas. The patient is not nervous/anxious and does not have insomnia.     Objective  BP 137/66  Pulse 71  Temp(Src) 98.1 F (36.7 C) (Oral)  Ht 5\' 4"  (1.626 m)  SpO2 98%  Physical Exam  Physical Exam  Constitutional: She is oriented to person, place, and time and well-developed, well-nourished,  and in no distress. No distress.  HENT:  Head: Normocephalic and atraumatic.  Eyes: Conjunctivae are normal.  Neck: Neck supple. No thyromegaly present.  Cardiovascular: Normal rate, regular rhythm and normal heart sounds.   No murmur heard. Pulmonary/Chest: Effort normal and breath sounds normal. She has no wheezes.  Abdominal: She exhibits no distension and no mass.  Genitourinary:  right breast 1 x 3 cm lesion, mildly tender at  10 oclock in right breast. Dense tissue noted b/l throughout. No skin changes or discharge.   Musculoskeletal: She exhibits no edema.  Lymphadenopathy:    She has no cervical adenopathy.  Neurological: She is alert and oriented to person, place, and time.  Skin: Skin is warm and dry. No rash noted. She is not diaphoretic.  Psychiatric: Memory, affect and judgment normal.    Lab Results  Component Value Date   TSH 3.038 01/13/2014   Lab Results  Component Value Date   WBC 6.0 01/13/2014   HGB 14.6 01/13/2014   HCT 42.6 01/13/2014   MCV 85.9 01/13/2014   PLT 266 01/13/2014   Lab Results  Component Value Date   CREATININE 0.78 01/13/2014   BUN 15 01/13/2014   NA 140 01/13/2014   K 4.1 01/13/2014   CL 102 01/13/2014   CO2 28 01/13/2014   Lab Results  Component Value Date   ALT 12 01/13/2014   AST 15 01/13/2014   ALKPHOS 61 01/13/2014   BILITOT 0.4 01/13/2014   Lab Results  Component Value Date   CHOL 208* 01/13/2014   Lab Results  Component Value Date   HDL 53 01/13/2014   Lab Results  Component Value Date   LDLCALC 128* 01/13/2014   Lab Results  Component Value Date   TRIG 134 01/13/2014   Lab Results  Component Value Date   CHOLHDL 3.9 01/13/2014     Assessment & Plan  HTN (hypertension) Well controlled, no changes to meds. Encouraged heart healthy diet such as the DASH diet and exercise as tolerated.   Hot flashes stable  Breast lesion Right breast 1x3 cm at 10 oclcock mildly tender, will proceed with imaging

## 2014-05-01 NOTE — Assessment & Plan Note (Signed)
Well controlled, no changes to meds. Encouraged heart healthy diet such as the DASH diet and exercise as tolerated.  °

## 2014-05-01 NOTE — Assessment & Plan Note (Signed)
Right breast 1x3 cm at 10 oclcock mildly tender, will proceed with imaging

## 2014-05-02 ENCOUNTER — Other Ambulatory Visit: Payer: Self-pay | Admitting: Family Medicine

## 2014-05-02 DIAGNOSIS — N649 Disorder of breast, unspecified: Secondary | ICD-10-CM

## 2014-05-03 ENCOUNTER — Ambulatory Visit: Payer: Medicare Other

## 2014-05-05 ENCOUNTER — Ambulatory Visit
Admission: RE | Admit: 2014-05-05 | Discharge: 2014-05-05 | Disposition: A | Discharge status: Home / Self Care | Payer: Medicare Other | Source: Ambulatory Visit | Attending: Family Medicine | Admitting: Family Medicine

## 2014-05-05 DIAGNOSIS — N649 Disorder of breast, unspecified: Secondary | ICD-10-CM

## 2014-05-05 DIAGNOSIS — N6489 Other specified disorders of breast: Secondary | ICD-10-CM | POA: Diagnosis not present

## 2014-05-23 DIAGNOSIS — H53483 Generalized contraction of visual field, bilateral: Secondary | ICD-10-CM | POA: Diagnosis not present

## 2014-06-01 ENCOUNTER — Other Ambulatory Visit: Payer: Self-pay | Admitting: Family Medicine

## 2014-07-07 ENCOUNTER — Other Ambulatory Visit: Payer: Medicare Other

## 2014-07-08 ENCOUNTER — Other Ambulatory Visit (INDEPENDENT_AMBULATORY_CARE_PROVIDER_SITE_OTHER): Payer: Medicare Other

## 2014-07-08 DIAGNOSIS — E785 Hyperlipidemia, unspecified: Secondary | ICD-10-CM | POA: Diagnosis not present

## 2014-07-08 DIAGNOSIS — I10 Essential (primary) hypertension: Secondary | ICD-10-CM | POA: Diagnosis not present

## 2014-07-08 LAB — CBC WITH DIFFERENTIAL/PLATELET
Basophils Absolute: 0 10*3/uL (ref 0.0–0.1)
Basophils Relative: 0.5 % (ref 0.0–3.0)
Eosinophils Absolute: 0.2 10*3/uL (ref 0.0–0.7)
Eosinophils Relative: 2.6 % (ref 0.0–5.0)
HCT: 42.7 % (ref 36.0–46.0)
Hemoglobin: 14.1 g/dL (ref 12.0–15.0)
Lymphocytes Relative: 34.2 % (ref 12.0–46.0)
Lymphs Abs: 2 10*3/uL (ref 0.7–4.0)
MCHC: 32.9 g/dL (ref 30.0–36.0)
MCV: 89.1 fl (ref 78.0–100.0)
Monocytes Absolute: 0.4 10*3/uL (ref 0.1–1.0)
Monocytes Relative: 7.4 % (ref 3.0–12.0)
Neutro Abs: 3.2 10*3/uL (ref 1.4–7.7)
Neutrophils Relative %: 55.3 % (ref 43.0–77.0)
Platelets: 265 10*3/uL (ref 150.0–400.0)
RBC: 4.8 Mil/uL (ref 3.87–5.11)
RDW: 13.6 % (ref 11.5–15.5)
WBC: 5.8 10*3/uL (ref 4.0–10.5)

## 2014-07-08 LAB — RENAL FUNCTION PANEL
Albumin: 3.9 g/dL (ref 3.5–5.2)
BUN: 22 mg/dL (ref 6–23)
CO2: 27 mEq/L (ref 19–32)
Calcium: 9.5 mg/dL (ref 8.4–10.5)
Chloride: 105 mEq/L (ref 96–112)
Creatinine, Ser: 0.9 mg/dL (ref 0.4–1.2)
GFR: 69.85 mL/min (ref >60.00)
Glucose, Bld: 86 mg/dL (ref 70–99)
Phosphorus: 3.8 mg/dL (ref 2.3–4.6)
Potassium: 3.8 mEq/L (ref 3.5–5.1)
Sodium: 137 mEq/L (ref 135–145)

## 2014-07-08 LAB — LIPID PANEL
Cholesterol: 250 mg/dL — ABNORMAL HIGH (ref 0–200)
HDL: 50.4 mg/dL (ref >39.00)
LDL Cholesterol: 170 mg/dL — ABNORMAL HIGH (ref 0–99)
NonHDL: 199.6
Total CHOL/HDL Ratio: 5
Triglycerides: 150 mg/dL — ABNORMAL HIGH (ref 0.0–149.0)
VLDL: 30 mg/dL (ref 0.0–40.0)

## 2014-07-14 ENCOUNTER — Ambulatory Visit (INDEPENDENT_AMBULATORY_CARE_PROVIDER_SITE_OTHER): Payer: Medicare Other | Admitting: Family Medicine

## 2014-07-14 ENCOUNTER — Encounter: Payer: Self-pay | Admitting: Family Medicine

## 2014-07-14 VITALS — BP 133/56 | HR 72 | Temp 98.0°F | Wt 157.0 lb

## 2014-07-14 DIAGNOSIS — E785 Hyperlipidemia, unspecified: Secondary | ICD-10-CM

## 2014-07-14 DIAGNOSIS — I10 Essential (primary) hypertension: Secondary | ICD-10-CM | POA: Diagnosis not present

## 2014-07-14 DIAGNOSIS — K219 Gastro-esophageal reflux disease without esophagitis: Secondary | ICD-10-CM

## 2014-07-14 DIAGNOSIS — E663 Overweight: Secondary | ICD-10-CM | POA: Diagnosis not present

## 2014-07-14 NOTE — Patient Instructions (Addendum)
Check with website on [place] for assistance with donut hole. Check with pharmacist regarding possible 30 day free coupons, and/or is there a similar med that is cheaper with your insurance plan  Also check [employer], [facility], [employer]. Compare prices  Cholesterol Cholesterol is a white, waxy, fat-like substance needed by your body in small amounts. The liver makes all the cholesterol you need. Cholesterol is carried from the liver by the blood through the blood vessels. Deposits of cholesterol (plaque) may build up on blood vessel walls. These make the arteries narrower and stiffer. Cholesterol plaques increase the risk for heart attack and stroke.  You cannot feel your cholesterol level even if it is very high. The only way to know it is high is with a blood test. Once you know your cholesterol levels, you should keep a record of the test results. Work with your health care provider to keep your levels in the desired range.  WHAT DO THE RESULTS MEAN?  Total cholesterol is a rough measure of all the cholesterol in your blood.   LDL is the so-called bad cholesterol. This is the type that deposits cholesterol in the walls of the arteries. You want this level to be low.   HDL is the good cholesterol because it cleans the arteries and carries the LDL away. You want this level to be high.  Triglycerides are fat that the body can either burn for energy or store. High levels are closely linked to heart disease.  WHAT ARE THE DESIRED LEVELS OF CHOLESTEROL?  Total cholesterol below 200.   LDL below 100 for people at risk, below 70 for those at very high risk.   HDL above 50 is good, above 60 is best.   Triglycerides below 150.  HOW CAN I LOWER MY CHOLESTEROL?  Diet. Follow your diet programs as directed by your health care provider.   Choose fish or white meat chicken and [place], roasted or baked. Limit fatty cuts of red meat, fried foods, and processed meats, such as sausage and lunch meats.    Eat lots of fresh fruits and vegetables.  Choose whole grains, beans, pasta, potatoes, and cereals.   Use only small amounts of olive, corn, or canola oils.   Avoid butter, mayonnaise, shortening, or palm kernel oils.  Avoid foods with trans fats.   Drink skim or nonfat milk and eat low-fat or nonfat yogurt and cheeses. Avoid whole milk, cream, ice cream, egg yolks, and full-fat cheeses.   Healthy desserts include angel food cake, ginger snaps, animal crackers, hard candy, popsicles, and low-fat or nonfat frozen yogurt. Avoid pastries, cakes, pies, and cookies.   Exercise. Follow your exercise programs as directed by your health care provider.   A regular program helps decrease LDL and raise HDL.   A regular program helps with weight control.   Do things that increase your activity level like gardening, walking, or taking the stairs. Ask your health care provider about how you can be more active in your daily life.   Medicine. Take medicine only as directed by your health care provider.   Medicine may be prescribed by your health care provider to help lower cholesterol and decrease the risk for heart disease.   If you have several risk factors, you may need medicine even if your levels are normal. Document Released: 04/09/2001 Document Revised: 11/29/2013 Document Reviewed: 04/28/2013 [facility] Patient Information 2015 [place], [place]. This information is not intended to replace advice given to you by your health care provider.  Make sure you discuss any questions you have with your health care provider.

## 2014-07-14 NOTE — Progress Notes (Signed)
Pre visit review using our clinic review tool, if applicable. No additional management support is needed unless otherwise documented below in the visit note. 

## 2014-07-14 NOTE — Progress Notes (Signed)
Laura Laura [name]  [id number] 07/02/1941 07/14/2014      Progress Note-Follow Up  Subjective  Chief Complaint  Chief Complaint  Patient presents with  . Follow-up    3 mos-BP check    HPI  Patient is a 73 y.o. female in today for routine medical care. Patient in today for follow-up. Had right breast mammogram and ultrasound in October which was unremarkable. She has been told to repeat her routine mammograms in 2016. She has Laura [name] breast complaints today. Pap is due next year. She's had Laura [name] recent illness. She denies any acute concerns except for some postnasal drip. Denies CP/palp/SOB/HA/congestion/fevers/GI or GU c/o. Taking meds as prescribed  Past Medical History  Diagnosis Date  . Chicken pox as a child  . Measles as a child  . Mumps as a child  . Hypertension 20 yrs ago  . 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 Cataract   . 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 Hyperlipidemia   . Overweight(278.02)   . GERD (gastroesophageal reflux disease)   . Other and unspecified hyperlipidemia 10/13/2013    Past Surgical History  Procedure Laterality Date  . Vaginal hysterectomy  73 yrs old    uterus removed  . Eye surgery  11-27-2010    cataract surgery in both eyes  . 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 Abdominal hysterectomy  30 yrs ago    heavy bleeding, ovaries left in place    Family History  Problem Relation Age of Onset  . Heart disease Father   . Heart attack Father 65  . Hypertension Father   . Dementia Maternal Grandmother   . Heart disease Maternal Grandfather   . Hyperlipidemia Paternal Grandmother   . Cancer Paternal Grandfather     stomach  . Heart attack Paternal Grandfather   . Dementia Mother     History   Social History  . Marital Status: Married    Spouse Name: N/A    Number of Children: N/A  . Years of Education: N/A   Occupational History  . Not on file.   Social History Main Topics  . Smoking  status: Former Smoker -- 1.00 packs/day for 16 years    Types: Cigarettes    Start date: 07/29/1972  . Smokeless tobacco: Never Used  . Alcohol Use: Yes     Comment: special occasions gllass of wine  . Drug Use: Laura [name]  . Sexual Activity: Yes   Other Topics Concern  . Not on file   Social History Narrative    Current Outpatient Prescriptions on File Prior to Visit  Medication Sig Dispense Refill  . cholecalciferol (VITAMIN D) 1000 UNITS tablet Take 2,000 Units by mouth daily.    . Fiber, Guar Gum, CHEW Chew by mouth as needed.    Marland Kitchen losartan-hydrochlorothiazide (HYZAAR) 50-12.5 MG per tablet TAKE 1 TABLET BY MOUTH DAILY 90 tablet 2   Laura [name] current facility-administered medications on file prior to visit.    Allergies  Allergen Reactions  . Lisinopril     dizzy  . Statins     Muscle soreness   . Tetracyclines & Related     GI upset    Review of Systems  Review of Systems  Constitutional: Negative for fever and malaise/fatigue.  HENT: Negative for congestion.   Eyes: Negative for discharge.  Respiratory: Negative for shortness of breath.   Cardiovascular: Negative for chest  pain, palpitations and leg swelling.  Gastrointestinal: Negative for nausea, abdominal pain and diarrhea.  Genitourinary: Negative for dysuria.  Musculoskeletal: Negative for falls.  Skin: Negative for rash.  Neurological: Negative for loss of consciousness and headaches.  Endo/Heme/Allergies: Negative for polydipsia.  Psychiatric/Behavioral: Negative for depression and suicidal ideas. The patient is not nervous/anxious and does not have insomnia.     Objective  BP 133/56 mmHg  Pulse 72  Temp(Src) 98 F (36.7 C) (Oral)  Wt 157 lb (71.215 kg)  SpO2 98%  Physical Exam  Physical Exam  Constitutional: She is oriented to person, place, and time and well-developed, well-nourished, and in Laura [name] distress. Laura [name] distress.  HENT:  Head: Normocephalic and atraumatic.  Eyes: Conjunctivae are normal.  Neck:  Neck supple. Laura [name] thyromegaly present.  Cardiovascular: Normal rate, regular rhythm and normal heart sounds.   Laura [name] murmur heard. Pulmonary/Chest: Effort normal and breath sounds normal. She has Laura [name] wheezes.  Abdominal: She exhibits Laura [name] distension and Laura [name] mass.  Musculoskeletal: She exhibits Laura [name] edema.  Lymphadenopathy:    She has Laura [name] cervical adenopathy.  Neurological: She is alert and oriented to person, place, and time.  Skin: Skin is warm and dry. Laura [name] rash noted. She is not diaphoretic.  Psychiatric: Memory, affect and judgment normal.    Lab Results  Component Value Date   TSH 3.038 01/13/2014   Lab Results  Component Value Date   WBC 5.8 07/08/2014   HGB 14.1 07/08/2014   HCT 42.7 07/08/2014   MCV 89.1 07/08/2014   PLT 265.0 07/08/2014   Lab Results  Component Value Date   CREATININE 0.9 07/08/2014   BUN 22 07/08/2014   NA 137 07/08/2014   K 3.8 07/08/2014   CL 105 07/08/2014   CO2 27 07/08/2014   Lab Results  Component Value Date   ALT 12 01/13/2014   AST 15 01/13/2014   ALKPHOS 61 01/13/2014   BILITOT 0.4 01/13/2014   Lab Results  Component Value Date   CHOL 250* 07/08/2014   Lab Results  Component Value Date   HDL 50.40 07/08/2014   Lab Results  Component Value Date   LDLCALC 170* 07/08/2014   Lab Results  Component Value Date   TRIG 150.0* 07/08/2014   Lab Results  Component Value Date   CHOLHDL 5 07/08/2014     Assessment & Plan  HTN (hypertension) Well controlled, Laura [name] changes to meds. Encouraged heart healthy diet such as the DASH diet and exercise as tolerated.   GERD (gastroesophageal reflux disease) Avoid offending foods, start probiotics. Do not eat large meals in late evening and consider raising head of bed.   Hyperlipidemia Encouraged heart healthy diet, increase exercise, avoid trans fats, consider a krill oil cap daily  Overweight Encouraged DASH diet, decrease po intake and increase exercise as tolerated. Needs 7-8 hours of sleep  nightly. Avoid trans fats, eat small, frequent meals every 4-5 hours with lean proteins, complex carbs and healthy fats. Minimize simple carbs, GMO foods.

## 2014-07-18 DIAGNOSIS — E785 Hyperlipidemia, unspecified: Secondary | ICD-10-CM | POA: Insufficient documentation

## 2014-07-18 NOTE — Assessment & Plan Note (Signed)
Well controlled, no changes to meds. Encouraged heart healthy diet such as the DASH diet and exercise as tolerated.  °

## 2014-07-18 NOTE — Assessment & Plan Note (Signed)
Encouraged DASH diet, decrease po intake and increase exercise as tolerated. Needs 7-8 hours of sleep nightly. Avoid trans fats, eat small, frequent meals every 4-5 hours with lean proteins, complex carbs and healthy fats. Minimize simple carbs, GMO foods. 

## 2014-07-18 NOTE — Assessment & Plan Note (Signed)
Encouraged heart healthy diet, increase exercise, avoid trans fats, consider a krill oil cap daily 

## 2014-07-18 NOTE — Assessment & Plan Note (Signed)
Avoid offending foods, start probiotics. Do not eat large meals in late evening and consider raising head of bed.  

## 2014-08-10 ENCOUNTER — Ambulatory Visit (INDEPENDENT_AMBULATORY_CARE_PROVIDER_SITE_OTHER): Payer: Medicare Other | Admitting: Physician Assistant

## 2014-08-10 ENCOUNTER — Encounter: Payer: Self-pay | Admitting: Physician Assistant

## 2014-08-10 VITALS — BP 112/68 | HR 64 | Temp 97.9°F | Resp 16 | Ht 64.0 in

## 2014-08-10 DIAGNOSIS — T148 Other injury of unspecified body region: Secondary | ICD-10-CM | POA: Diagnosis not present

## 2014-08-10 DIAGNOSIS — T148XXA Other injury of unspecified body region, initial encounter: Secondary | ICD-10-CM

## 2014-08-10 MED ORDER — MELOXICAM 7.5 MG PO TABS: 7.5000 mg | ORAL_TABLET | Freq: Every day | ORAL | Status: DC

## 2014-08-10 NOTE — Patient Instructions (Signed)
Please take Meloxicam daily as directed.  Use tylenol or Ibuprofen later in the day if needed.  Apply topical Icy Hot or Aspercreme to the affected area.  Avoid heavy lifting or overexertion.  Call or return to clinic if symptoms are not improving.

## 2014-08-10 NOTE — Progress Notes (Signed)
Pre visit review using our clinic review tool, if applicable. No additional management support is needed unless otherwise documented below in the visit note/SLS  

## 2014-08-11 DIAGNOSIS — T148XXA Other injury of unspecified body region, initial encounter: Secondary | ICD-10-CM | POA: Insufficient documentation

## 2014-08-11 NOTE — Progress Notes (Signed)
Patient presents to clinic today c/o intermittent upper back pain that is present when bending or twisting.  Symptom onset was 2 weeks ago.  Denies trauma or injury. Denies radiation of pain. Has not taken anything for symptoms.  Past Medical History  Diagnosis Date  . Chicken pox as a child  . Measles as a child  . Mumps as a child  . Hypertension 20 yrs ago  . 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 Cataract   . 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 Hyperlipidemia   . Overweight(278.02)   . GERD (gastroesophageal reflux disease)   . Other and unspecified hyperlipidemia 10/13/2013    Current Outpatient Prescriptions on File Prior to Visit  Medication Sig Dispense Refill  . cholecalciferol (VITAMIN D) 1000 UNITS tablet Take 2,000 Units by mouth daily.    . Fiber, Guar Gum, CHEW Chew by mouth as needed.    Marland Kitchen losartan-hydrochlorothiazide (HYZAAR) 50-12.5 MG per tablet TAKE 1 TABLET BY MOUTH DAILY 90 tablet 2  . Multiple Vitamins-Minerals (MULTI-VITAMIN GUMMIES) CHEW Chew 1 tablet by mouth daily.     No current facility-administered medications on file prior to visit.    Allergies  Allergen Reactions  . Lisinopril     dizzy  . Statins     Muscle soreness   . Tetracyclines & Related     GI upset    Family History  Problem Relation Age of Onset  . Heart disease Father   . Heart attack Father 88  . Hypertension Father   . Dementia Maternal Grandmother   . Heart disease Maternal Grandfather   . Hyperlipidemia Paternal Grandmother   . Cancer Paternal Grandfather     stomach  . Heart attack Paternal Grandfather   . Dementia Mother     History   Social History  . Marital Status: Married    Spouse Name: N/A    Number of Children: N/A  . Years of Education: N/A   Social History Main Topics  . Smoking status: Former Smoker -- 1.00 packs/day for 16 years    Types: Cigarettes    Start date: 07/29/1972  . Smokeless tobacco: Never Used  . Alcohol Use: Yes   Comment: special occasions gllass of wine  . Drug Use: No  . Sexual Activity: Yes   Other Topics Concern  . None   Social History Narrative    Review of Systems - See HPI.  All other ROS are negative.  BP 112/68 mmHg  Pulse 64  Temp(Src) 97.9 F (36.6 C) (Oral)  Resp 16  Ht 5\' 4"  (1.626 m)  Wt   SpO2 100%  Physical Exam  Constitutional: She is oriented to person, place, and time and well-developed, well-nourished, and in no distress.  Cardiovascular: Normal rate, regular rhythm, normal heart sounds and intact distal pulses.   Pulmonary/Chest: Effort normal and breath sounds normal. No respiratory distress. She has no wheezes. She has no rales. She exhibits no tenderness.  Musculoskeletal:       Cervical back: Normal.       Thoracic back: She exhibits pain and spasm. She exhibits no tenderness and no bony tenderness.  Neurological: She is alert and oriented to person, place, and time.  Skin: Skin is warm and dry. No rash noted.  Psychiatric: Affect normal.  Vitals reviewed.   Recent Results (from the past 2160 hour(s))  Lipid Profile     Status: Abnormal  Collection Time: 07/08/14  9:57 AM  Result Value Ref Range   Cholesterol 250 (H) 0 - 200 mg/dL    Comment: ATP III Classification       Desirable:  < 200 mg/dL               Borderline High:  200 - 239 mg/dL          High:  > = 240 mg/dL   Triglycerides 150.0 (H) 0.0 - 149.0 mg/dL    Comment: Normal:  <150 mg/dLBorderline High:  150 - 199 mg/dL   HDL 50.40 >39.00 mg/dL   VLDL 30.0 0.0 - 40.0 mg/dL   LDL Cholesterol 170 (H) 0 - 99 mg/dL   Total CHOL/HDL Ratio 5     Comment:                Men          Women1/2 Average Risk     3.4          3.3Average Risk          5.0          4.42X Average Risk          9.6          7.13X Average Risk          15.0          11.0                       NonHDL 199.60     Comment: NOTE:  Non-HDL goal should be 30 mg/dL higher than patient's LDL goal (i.e. LDL goal of < 70 mg/dL, would  have non-HDL goal of < 100 mg/dL)  Renal Function Panel     Status: None   Collection Time: 07/08/14  9:57 AM  Result Value Ref Range   Sodium 137 135 - 145 mEq/L   Potassium 3.8 3.5 - 5.1 mEq/L   Chloride 105 96 - 112 mEq/L   CO2 27 19 - 32 mEq/L   Calcium 9.5 8.4 - 10.5 mg/dL   Albumin 3.9 3.5 - 5.2 g/dL   BUN 22 6 - 23 mg/dL   Creatinine, Ser 0.9 0.4 - 1.2 mg/dL   Glucose, Bld 86 70 - 99 mg/dL   Phosphorus 3.8 2.3 - 4.6 mg/dL   GFR 69.85 >60.00 mL/min  CBC w/Diff     Status: None   Collection Time: 07/08/14  9:57 AM  Result Value Ref Range   WBC 5.8 4.0 - 10.5 K/uL   RBC 4.80 3.87 - 5.11 Mil/uL   Hemoglobin 14.1 12.0 - 15.0 g/dL   HCT 42.7 36.0 - 46.0 %   MCV 89.1 78.0 - 100.0 fl   MCHC 32.9 30.0 - 36.0 g/dL   RDW 13.6 11.5 - 15.5 %   Platelets 265.0 150.0 - 400.0 K/uL   Neutrophils Relative % 55.3 43.0 - 77.0 %   Lymphocytes Relative 34.2 12.0 - 46.0 %   Monocytes Relative 7.4 3.0 - 12.0 %   Eosinophils Relative 2.6 0.0 - 5.0 %   Basophils Relative 0.5 0.0 - 3.0 %   Neutro Abs 3.2 1.4 - 7.7 K/uL   Lymphs Abs 2.0 0.7 - 4.0 K/uL   Monocytes Absolute 0.4 0.1 - 1.0 K/uL   Eosinophils Absolute 0.2 0.0 - 0.7 K/uL   Basophils Absolute 0.0 0.0 - 0.1 K/uL    Assessment/Plan: Muscle strain Rx Mobic 7.5 daily. Apply topical Aspercreme. Avoid  heavy lifting or overexertion. Follow-up if no improvement over the next week.

## 2014-08-11 NOTE — Assessment & Plan Note (Signed)
Rx Mobic 7.5 daily. Apply topical Aspercreme. Avoid heavy lifting or overexertion. Follow-up if no improvement over the next week.

## 2014-12-28 ENCOUNTER — Telehealth: Payer: Self-pay | Admitting: Family Medicine

## 2014-12-28 NOTE — Telephone Encounter (Signed)
Pre Visit letter sent  °

## 2015-01-10 ENCOUNTER — Encounter: Payer: Self-pay | Admitting: *Deleted

## 2015-01-10 ENCOUNTER — Telehealth: Payer: Self-pay | Admitting: *Deleted

## 2015-01-10 NOTE — Telephone Encounter (Signed)
Pre-Visit Call completed with patient and chart updated.   Pre-Visit Info documented in Specialty Comments under [facility].    

## 2015-01-16 ENCOUNTER — Telehealth: Payer: Self-pay | Admitting: Behavioral Health

## 2015-01-16 ENCOUNTER — Encounter: Payer: Self-pay | Admitting: Behavioral Health

## 2015-01-16 NOTE — Telephone Encounter (Signed)
Spoke with patient and she informed Probation officer that other RN staff has updated the patient's pre-visit information.   Pre-visit info listed in the Specialty Comments under [facility].

## 2015-01-17 ENCOUNTER — Ambulatory Visit (HOSPITAL_BASED_OUTPATIENT_CLINIC_OR_DEPARTMENT_OTHER)
Admission: RE | Admit: 2015-01-17 | Discharge: 2015-01-17 | Disposition: A | Discharge status: Home / Self Care | Payer: Medicare Other | Source: Ambulatory Visit | Attending: Family Medicine | Admitting: Family Medicine

## 2015-01-17 ENCOUNTER — Encounter: Payer: Self-pay | Admitting: Family Medicine

## 2015-01-17 ENCOUNTER — Ambulatory Visit (INDEPENDENT_AMBULATORY_CARE_PROVIDER_SITE_OTHER): Payer: Medicare Other | Admitting: Family Medicine

## 2015-01-17 VITALS — BP 122/72 | HR 87 | Temp 98.0°F | Ht 64.0 in | Wt 167.0 lb

## 2015-01-17 DIAGNOSIS — Z780 Asymptomatic menopausal state: Secondary | ICD-10-CM

## 2015-01-17 DIAGNOSIS — M25552 Pain in left hip: Secondary | ICD-10-CM | POA: Diagnosis not present

## 2015-01-17 DIAGNOSIS — E663 Overweight: Secondary | ICD-10-CM | POA: Diagnosis not present

## 2015-01-17 DIAGNOSIS — Z1211 Encounter for screening for malignant neoplasm of colon: Secondary | ICD-10-CM

## 2015-01-17 DIAGNOSIS — I10 Essential (primary) hypertension: Secondary | ICD-10-CM

## 2015-01-17 DIAGNOSIS — G8929 Other chronic pain: Secondary | ICD-10-CM | POA: Insufficient documentation

## 2015-01-17 DIAGNOSIS — E785 Hyperlipidemia, unspecified: Secondary | ICD-10-CM | POA: Diagnosis not present

## 2015-01-17 DIAGNOSIS — E559 Vitamin D deficiency, unspecified: Secondary | ICD-10-CM | POA: Diagnosis not present

## 2015-01-17 DIAGNOSIS — Z1239 Encounter for other screening for malignant neoplasm of breast: Secondary | ICD-10-CM

## 2015-01-17 DIAGNOSIS — Z0000 Encounter for general adult medical examination without abnormal findings: Secondary | ICD-10-CM

## 2015-01-17 DIAGNOSIS — C4491 Basal cell carcinoma of skin, unspecified: Secondary | ICD-10-CM

## 2015-01-17 LAB — COMPREHENSIVE METABOLIC PANEL
ALT: 12 U/L (ref 0–35)
AST: 16 U/L (ref 0–37)
Albumin: 4.2 g/dL (ref 3.5–5.2)
Alkaline Phosphatase: 54 U/L (ref 39–117)
BUN: 23 mg/dL (ref 6–23)
CO2: 29 mEq/L (ref 19–32)
Calcium: 10.1 mg/dL (ref 8.4–10.5)
Chloride: 104 mEq/L (ref 96–112)
Creatinine, Ser: 0.9 mg/dL (ref 0.40–1.20)
GFR: 65.29 mL/min (ref >60.00)
Glucose, Bld: 83 mg/dL (ref 70–99)
Potassium: 3.8 mEq/L (ref 3.5–5.1)
Sodium: 140 mEq/L (ref 135–145)
Total Bilirubin: 0.4 mg/dL (ref 0.2–1.2)
Total Protein: 7.2 g/dL (ref 6.0–8.3)

## 2015-01-17 LAB — CBC
HCT: 40.7 % (ref 36.0–46.0)
Hemoglobin: 13.8 g/dL (ref 12.0–15.0)
MCHC: 33.9 g/dL (ref 30.0–36.0)
MCV: 86.5 fl (ref 78.0–100.0)
Platelets: 273 10*3/uL (ref 150.0–400.0)
RBC: 4.71 Mil/uL (ref 3.87–5.11)
RDW: 13.6 % (ref 11.5–15.5)
WBC: 6.2 10*3/uL (ref 4.0–10.5)

## 2015-01-17 LAB — LIPID PANEL
Cholesterol: 227 mg/dL — ABNORMAL HIGH (ref 0–200)
HDL: 51.3 mg/dL (ref >39.00)
LDL Cholesterol: 146 mg/dL — ABNORMAL HIGH (ref 0–99)
NonHDL: 175.7
Total CHOL/HDL Ratio: 4
Triglycerides: 150 mg/dL — ABNORMAL HIGH (ref 0.0–149.0)
VLDL: 30 mg/dL (ref 0.0–40.0)

## 2015-01-17 LAB — TSH: TSH: 2.73 u[IU]/mL (ref 0.35–4.50)

## 2015-01-17 LAB — VITAMIN D 25 HYDROXY (VIT D DEFICIENCY, FRACTURES): VITD: 30.03 ng/mL (ref 30.00–100.00)

## 2015-01-17 MED ORDER — LOSARTAN POTASSIUM-HCTZ 50-12.5 MG PO TABS: 1.0000 | ORAL_TABLET | Freq: Every day | ORAL | Status: DC

## 2015-01-17 NOTE — Patient Instructions (Signed)
Preventive Care for Adults A healthy lifestyle and preventive care can promote health and wellness. Preventive health guidelines for women include the following key practices.  A routine yearly physical is a good way to check with your health care provider about your health and preventive screening. It is a chance to share any concerns and updates on your health and to receive a thorough exam.  Visit your dentist for a routine exam and preventive care every 6 months. Brush your teeth twice a day and floss once a day. Good oral hygiene prevents tooth decay and gum disease.  The frequency of eye exams is based on your age, health, family medical history, use of contact lenses, and other factors. Follow your health care provider's recommendations for frequency of eye exams.  Eat a healthy diet. Foods like vegetables, fruits, whole grains, low-fat dairy products, and lean protein foods contain the nutrients you need without too many calories. Decrease your intake of foods high in solid fats, added sugars, and salt. Eat the right amount of calories for you.Get information about a proper diet from your health care provider, if necessary.  Regular physical exercise is one of the most important things you can do for your health. Most adults should get at least 150 minutes of moderate-intensity exercise (any activity that increases your heart rate and causes you to sweat) each week. In addition, most adults need muscle-strengthening exercises on 2 or more days a week.  Maintain a healthy weight. The body mass index (BMI) is a screening tool to identify possible weight problems. It provides an estimate of body fat based on height and weight. Your health care provider can find your BMI and can help you achieve or maintain a healthy weight.For adults 20 years and older:  A BMI below 18.5 is considered underweight.  A BMI of 18.5 to 24.9 is normal.  A BMI of 25 to 29.9 is considered overweight.  A BMI of  30 and above is considered obese.  Maintain normal blood lipids and cholesterol levels by exercising and minimizing your intake of saturated fat. Eat a balanced diet with plenty of fruit and vegetables. Blood tests for lipids and cholesterol should begin at age 76 and be repeated every 5 years. If your lipid or cholesterol levels are high, you are over 50, or you are at high risk for heart disease, you may need your cholesterol levels checked more frequently.Ongoing high lipid and cholesterol levels should be treated with medicines if diet and exercise are not working.  If you smoke, find out from your health care provider how to quit. If you do not use tobacco, do not start.  Lung cancer screening is recommended for adults aged 22-80 years who are at high risk for developing lung cancer because of a history of smoking. A yearly low-dose CT scan of the lungs is recommended for people who have at least a 30-pack-year history of smoking and are a current smoker or have quit within the past 15 years. A pack year of smoking is smoking an average of 1 pack of cigarettes a day for 1 year (for example: 1 pack a day for 30 years or 2 packs a day for 15 years). Yearly screening should continue until the smoker has stopped smoking for at least 15 years. Yearly screening should be stopped for people who develop a health problem that would prevent them from having lung cancer treatment.  If you are pregnant, do not drink alcohol. If you are breastfeeding,  be very cautious about drinking alcohol. If you are not pregnant and choose to drink alcohol, do not have more than 1 drink per day. One drink is considered to be 12 ounces (355 mL) of beer, 5 ounces (148 mL) of wine, or 1.5 ounces (44 mL) of liquor.  Avoid use of street drugs. Do not share needles with anyone. Ask for help if you need support or instructions about stopping the use of drugs.  High blood pressure causes heart disease and increases the risk of  stroke. Your blood pressure should be checked at least every 1 to 2 years. Ongoing high blood pressure should be treated with medicines if weight loss and exercise do not work.  If you are 75-52 years old, ask your health care provider if you should take aspirin to prevent strokes.  Diabetes screening involves taking a blood sample to check your fasting blood sugar level. This should be done once every 3 years, after age 15, if you are within normal weight and without risk factors for diabetes. Testing should be considered at a younger age or be carried out more frequently if you are overweight and have at least 1 risk factor for diabetes.  Breast cancer screening is essential preventive care for women. You should practice "breast self-awareness." This means understanding the normal appearance and feel of your breasts and may include breast self-examination. Any changes detected, no matter how small, should be reported to a health care provider. Women in their 58s and 30s should have a clinical breast exam (CBE) by a health care provider as part of a regular health exam every 1 to 3 years. After age 16, women should have a CBE every year. Starting at age 53, women should consider having a mammogram (breast X-ray test) every year. Women who have a family history of breast cancer should talk to their health care provider about genetic screening. Women at a high risk of breast cancer should talk to their health care providers about having an MRI and a mammogram every year.  Breast cancer gene (BRCA)-related cancer risk assessment is recommended for women who have family members with BRCA-related cancers. BRCA-related cancers include breast, ovarian, tubal, and peritoneal cancers. Having family members with these cancers may be associated with an increased risk for harmful changes (mutations) in the breast cancer genes BRCA1 and BRCA2. Results of the assessment will determine the need for genetic counseling and  BRCA1 and BRCA2 testing.  Routine pelvic exams to screen for cancer are no longer recommended for nonpregnant women who are considered low risk for cancer of the pelvic organs (ovaries, uterus, and vagina) and who do not have symptoms. Ask your health care provider if a screening pelvic exam is right for you.  If you have had past treatment for cervical cancer or a condition that could lead to cancer, you need Pap tests and screening for cancer for at least 20 years after your treatment. If Pap tests have been discontinued, your risk factors (such as having a new sexual partner) need to be reassessed to determine if screening should be resumed. Some women have medical problems that increase the chance of getting cervical cancer. In these cases, your health care provider may recommend more frequent screening and Pap tests.  The HPV test is an additional test that may be used for cervical cancer screening. The HPV test looks for the virus that can cause the cell changes on the cervix. The cells collected during the Pap test can be  tested for HPV. The HPV test could be used to screen women aged 30 years and older, and should be used in women of any age who have unclear Pap test results. After the age of 30, women should have HPV testing at the same frequency as a Pap test.  Colorectal cancer can be detected and often prevented. Most routine colorectal cancer screening begins at the age of 50 years and continues through age 75 years. However, your health care provider may recommend screening at an earlier age if you have risk factors for colon cancer. On a yearly basis, your health care provider may provide home test kits to check for hidden blood in the stool. Use of a small camera at the end of a tube, to directly examine the colon (sigmoidoscopy or colonoscopy), can detect the earliest forms of colorectal cancer. Talk to your health care provider about this at age 50, when routine screening begins. Direct  exam of the colon should be repeated every 5-10 years through age 75 years, unless early forms of pre-cancerous polyps or small growths are found.  People who are at an increased risk for hepatitis B should be screened for this virus. You are considered at high risk for hepatitis B if:  You were born in a country where hepatitis B occurs often. Talk with your health care provider about which countries are considered high risk.  Your parents were born in a high-risk country and you have not received a shot to protect against hepatitis B (hepatitis B vaccine).  You have HIV or AIDS.  You use needles to inject street drugs.  You live with, or have sex with, someone who has hepatitis B.  You get hemodialysis treatment.  You take certain medicines for conditions like cancer, organ transplantation, and autoimmune conditions.  Hepatitis C blood testing is recommended for all people born from 1945 through 1965 and any individual with known risks for hepatitis C.  Practice safe sex. Use condoms and avoid high-risk sexual practices to reduce the spread of sexually transmitted infections (STIs). STIs include gonorrhea, chlamydia, syphilis, trichomonas, herpes, HPV, and human immunodeficiency virus (HIV). Herpes, HIV, and HPV are viral illnesses that have no cure. They can result in disability, cancer, and death.  You should be screened for sexually transmitted illnesses (STIs) including gonorrhea and chlamydia if:  You are sexually active and are younger than 24 years.  You are older than 24 years and your health care provider tells you that you are at risk for this type of infection.  Your sexual activity has changed since you were last screened and you are at an increased risk for chlamydia or gonorrhea. Ask your health care provider if you are at risk.  If you are at risk of being infected with HIV, it is recommended that you take a prescription medicine daily to prevent HIV infection. This is  called preexposure prophylaxis (PrEP). You are considered at risk if:  You are a heterosexual woman, are sexually active, and are at increased risk for HIV infection.  You take drugs by injection.  You are sexually active with a partner who has HIV.  Talk with your health care provider about whether you are at high risk of being infected with HIV. If you choose to begin PrEP, you should first be tested for HIV. You should then be tested every 3 months for as long as you are taking PrEP.  Osteoporosis is a disease in which the bones lose minerals and strength   with aging. This can result in serious bone fractures or breaks. The risk of osteoporosis can be identified using a bone density scan. Women ages 65 years and over and women at risk for fractures or osteoporosis should discuss screening with their health care providers. Ask your health care provider whether you should take a calcium supplement or vitamin D to reduce the rate of osteoporosis.  Menopause can be associated with physical symptoms and risks. Hormone replacement therapy is available to decrease symptoms and risks. You should talk to your health care provider about whether hormone replacement therapy is right for you.  Use sunscreen. Apply sunscreen liberally and repeatedly throughout the day. You should seek shade when your shadow is shorter than you. Protect yourself by wearing long sleeves, pants, a wide-brimmed hat, and sunglasses year round, whenever you are outdoors.  Once a month, do a whole body skin exam, using a mirror to look at the skin on your back. Tell your health care provider of new moles, moles that have irregular borders, moles that are larger than a pencil eraser, or moles that have changed in shape or color.  Stay current with required vaccines (immunizations).  Influenza vaccine. All adults should be immunized every year.  Tetanus, diphtheria, and acellular pertussis (Td, Tdap) vaccine. Pregnant women should  receive 1 dose of Tdap vaccine during each pregnancy. The dose should be obtained regardless of the length of time since the last dose. Immunization is preferred during the 27th-36th week of gestation. An adult who has not previously received Tdap or who does not know her vaccine status should receive 1 dose of Tdap. This initial dose should be followed by tetanus and diphtheria toxoids (Td) booster doses every 10 years. Adults with an unknown or incomplete history of completing a 3-dose immunization series with Td-containing vaccines should begin or complete a primary immunization series including a Tdap dose. Adults should receive a Td booster every 10 years.  Varicella vaccine. An adult without evidence of immunity to varicella should receive 2 doses or a second dose if she has previously received 1 dose. Pregnant females who do not have evidence of immunity should receive the first dose after pregnancy. This first dose should be obtained before leaving the health care facility. The second dose should be obtained 4-8 weeks after the first dose.  Human papillomavirus (HPV) vaccine. Females aged 13-26 years who have not received the vaccine previously should obtain the 3-dose series. The vaccine is not recommended for use in pregnant females. However, pregnancy testing is not needed before receiving a dose. If a female is found to be pregnant after receiving a dose, no treatment is needed. In that case, the remaining doses should be delayed until after the pregnancy. Immunization is recommended for any person with an immunocompromised condition through the age of 26 years if she did not get any or all doses earlier. During the 3-dose series, the second dose should be obtained 4-8 weeks after the first dose. The third dose should be obtained 24 weeks after the first dose and 16 weeks after the second dose.  Zoster vaccine. One dose is recommended for adults aged 60 years or older unless certain conditions are  present.  Measles, mumps, and rubella (MMR) vaccine. Adults born before 1957 generally are considered immune to measles and mumps. Adults born in 1957 or later should have 1 or more doses of MMR vaccine unless there is a contraindication to the vaccine or there is laboratory evidence of immunity to   each of the three diseases. A routine second dose of MMR vaccine should be obtained at least 28 days after the first dose for students attending postsecondary schools, health care workers, or international travelers. People who received inactivated measles vaccine or an unknown type of measles vaccine during 1963-1967 should receive 2 doses of MMR vaccine. People who received inactivated mumps vaccine or an unknown type of mumps vaccine before 1979 and are at high risk for mumps infection should consider immunization with 2 doses of MMR vaccine. For females of childbearing age, rubella immunity should be determined. If there is no evidence of immunity, females who are not pregnant should be vaccinated. If there is no evidence of immunity, females who are pregnant should delay immunization until after pregnancy. Unvaccinated health care workers born before 1957 who lack laboratory evidence of measles, mumps, or rubella immunity or laboratory confirmation of disease should consider measles and mumps immunization with 2 doses of MMR vaccine or rubella immunization with 1 dose of MMR vaccine.  Pneumococcal 13-valent conjugate (PCV13) vaccine. When indicated, a person who is uncertain of her immunization history and has no record of immunization should receive the PCV13 vaccine. An adult aged 19 years or older who has certain medical conditions and has not been previously immunized should receive 1 dose of PCV13 vaccine. This PCV13 should be followed with a dose of pneumococcal polysaccharide (PPSV23) vaccine. The PPSV23 vaccine dose should be obtained at least 8 weeks after the dose of PCV13 vaccine. An adult aged 19  years or older who has certain medical conditions and previously received 1 or more doses of PPSV23 vaccine should receive 1 dose of PCV13. The PCV13 vaccine dose should be obtained 1 or more years after the last PPSV23 vaccine dose.  Pneumococcal polysaccharide (PPSV23) vaccine. When PCV13 is also indicated, PCV13 should be obtained first. All adults aged 65 years and older should be immunized. An adult younger than age 65 years who has certain medical conditions should be immunized. Any person who resides in a nursing home or long-term care facility should be immunized. An adult smoker should be immunized. People with an immunocompromised condition and certain other conditions should receive both PCV13 and PPSV23 vaccines. People with human immunodeficiency virus (HIV) infection should be immunized as soon as possible after diagnosis. Immunization during chemotherapy or radiation therapy should be avoided. Routine use of PPSV23 vaccine is not recommended for American Indians, [place] Natives, or people younger than 65 years unless there are medical conditions that require PPSV23 vaccine. When indicated, people who have unknown immunization and have no record of immunization should receive PPSV23 vaccine. One-time revaccination 5 years after the first dose of PPSV23 is recommended for people aged 19-64 years who have chronic kidney failure, nephrotic syndrome, asplenia, or immunocompromised conditions. People who received 1-2 doses of PPSV23 before age 65 years should receive another dose of PPSV23 vaccine at age 65 years or later if at least 5 years have passed since the previous dose. Doses of PPSV23 are not needed for people immunized with PPSV23 at or after age 65 years.  Meningococcal vaccine. Adults with asplenia or persistent complement component deficiencies should receive 2 doses of quadrivalent meningococcal conjugate (MenACWY-D) vaccine. The doses should be obtained at least 2 months apart.  Microbiologists working with certain meningococcal bacteria, military recruits, people at risk during an outbreak, and people who travel to or live in countries with a high rate of meningitis should be immunized. A first-year college student up through age   21 years who is living in a residence hall should receive a dose if she did not receive a dose on or after her 16th birthday. Adults who have certain high-risk conditions should receive one or more doses of vaccine.  Hepatitis A vaccine. Adults who wish to be protected from this disease, have certain high-risk conditions, work with hepatitis A-infected animals, work in hepatitis A research labs, or travel to or work in countries with a high rate of hepatitis A should be immunized. Adults who were previously unvaccinated and who anticipate close contact with an international adoptee during the first 60 days after arrival in the [place] from a country with a high rate of hepatitis A should be immunized.  Hepatitis B vaccine. Adults who wish to be protected from this disease, have certain high-risk conditions, may be exposed to blood or other infectious body fluids, are household contacts or sex partners of hepatitis B positive people, are clients or workers in certain care facilities, or travel to or work in countries with a high rate of hepatitis B should be immunized.  Haemophilus influenzae type b (Hib) vaccine. A previously unvaccinated person with asplenia or sickle cell disease or having a scheduled splenectomy should receive 1 dose of Hib vaccine. Regardless of previous immunization, a recipient of a hematopoietic stem cell transplant should receive a 3-dose series 6-12 months after her successful transplant. Hib vaccine is not recommended for adults with HIV infection. Preventive Services / Frequency Ages 64 to 68 years  Blood pressure check.** / Every 1 to 2 years.  Lipid and cholesterol check.** / Every 5 years beginning at age  22.  Clinical breast exam.** / Every 3 years for women in their 88s and 53s.  BRCA-related cancer risk assessment.** / For women who have family members with a BRCA-related cancer (breast, ovarian, tubal, or peritoneal cancers).  Pap test.** / Every 2 years from ages 90 through 51. Every 3 years starting at age 21 through age 56 or 3 with a history of 3 consecutive normal Pap tests.  HPV screening.** / Every 3 years from ages 24 through ages 1 to 46 with a history of 3 consecutive normal Pap tests.  Hepatitis C blood test.** / For any individual with known risks for hepatitis C.  Skin self-exam. / Monthly.  Influenza vaccine. / Every year.  Tetanus, diphtheria, and acellular pertussis (Tdap, Td) vaccine.** / Consult your health care provider. Pregnant women should receive 1 dose of Tdap vaccine during each pregnancy. 1 dose of Td every 10 years.  Varicella vaccine.** / Consult your health care provider. Pregnant females who do not have evidence of immunity should receive the first dose after pregnancy.  HPV vaccine. / 3 doses over 6 months, if 72 and younger. The vaccine is not recommended for use in pregnant females. However, pregnancy testing is not needed before receiving a dose.  Measles, mumps, rubella (MMR) vaccine.** / You need at least 1 dose of MMR if you were born in 1957 or later. You may also need a 2nd dose. For females of childbearing age, rubella immunity should be determined. If there is no evidence of immunity, females who are not pregnant should be vaccinated. If there is no evidence of immunity, females who are pregnant should delay immunization until after pregnancy.  Pneumococcal 13-valent conjugate (PCV13) vaccine.** / Consult your health care provider.  Pneumococcal polysaccharide (PPSV23) vaccine.** / 1 to 2 doses if you smoke cigarettes or if you have certain conditions.  Meningococcal vaccine.** /  1 dose if you are age 19 to 21 years and a first-year college  student living in a residence hall, or have one of several medical conditions, you need to get vaccinated against meningococcal disease. You may also need additional booster doses.  Hepatitis A vaccine.** / Consult your health care provider.  Hepatitis B vaccine.** / Consult your health care provider.  Haemophilus influenzae type b (Hib) vaccine.** / Consult your health care provider. Ages 40 to 64 years  Blood pressure check.** / Every 1 to 2 years.  Lipid and cholesterol check.** / Every 5 years beginning at age 20 years.  Lung cancer screening. / Every year if you are aged 55-80 years and have a 30-pack-year history of smoking and currently smoke or have quit within the past 15 years. Yearly screening is stopped once you have quit smoking for at least 15 years or develop a health problem that would prevent you from having lung cancer treatment.  Clinical breast exam.** / Every year after age 40 years.  BRCA-related cancer risk assessment.** / For women who have family members with a BRCA-related cancer (breast, ovarian, tubal, or peritoneal cancers).  Mammogram.** / Every year beginning at age 40 years and continuing for as long as you are in good health. Consult with your health care provider.  Pap test.** / Every 3 years starting at age 30 years through age 65 or 70 years with a history of 3 consecutive normal Pap tests.  HPV screening.** / Every 3 years from ages 30 years through ages 65 to 70 years with a history of 3 consecutive normal Pap tests.  Fecal occult blood test (FOBT) of stool. / Every year beginning at age 50 years and continuing until age 75 years. You may not need to do this test if you get a colonoscopy every 10 years.  Flexible sigmoidoscopy or colonoscopy.** / Every 5 years for a flexible sigmoidoscopy or every 10 years for a colonoscopy beginning at age 50 years and continuing until age 75 years.  Hepatitis C blood test.** / For all people born from 1945 through  1965 and any individual with known risks for hepatitis C.  Skin self-exam. / Monthly.  Influenza vaccine. / Every year.  Tetanus, diphtheria, and acellular pertussis (Tdap/Td) vaccine.** / Consult your health care provider. Pregnant women should receive 1 dose of Tdap vaccine during each pregnancy. 1 dose of Td every 10 years.  Varicella vaccine.** / Consult your health care provider. Pregnant females who do not have evidence of immunity should receive the first dose after pregnancy.  Zoster vaccine.** / 1 dose for adults aged 60 years or older.  Measles, mumps, rubella (MMR) vaccine.** / You need at least 1 dose of MMR if you were born in 1957 or later. You may also need a 2nd dose. For females of childbearing age, rubella immunity should be determined. If there is no evidence of immunity, females who are not pregnant should be vaccinated. If there is no evidence of immunity, females who are pregnant should delay immunization until after pregnancy.  Pneumococcal 13-valent conjugate (PCV13) vaccine.** / Consult your health care provider.  Pneumococcal polysaccharide (PPSV23) vaccine.** / 1 to 2 doses if you smoke cigarettes or if you have certain conditions.  Meningococcal vaccine.** / Consult your health care provider.  Hepatitis A vaccine.** / Consult your health care provider.  Hepatitis B vaccine.** / Consult your health care provider.  Haemophilus influenzae type b (Hib) vaccine.** / Consult your health care provider. Ages 65   years and over  Blood pressure check.** / Every 1 to 2 years.  Lipid and cholesterol check.** / Every 5 years beginning at age 22 years.  Lung cancer screening. / Every year if you are aged 73-80 years and have a 30-pack-year history of smoking and currently smoke or have quit within the past 15 years. Yearly screening is stopped once you have quit smoking for at least 15 years or develop a health problem that would prevent you from having lung cancer  treatment.  Clinical breast exam.** / Every year after age 4 years.  BRCA-related cancer risk assessment.** / For women who have family members with a BRCA-related cancer (breast, ovarian, tubal, or peritoneal cancers).  Mammogram.** / Every year beginning at age 40 years and continuing for as long as you are in good health. Consult with your health care provider.  Pap test.** / Every 3 years starting at age 9 years through age 34 or 91 years with 3 consecutive normal Pap tests. Testing can be stopped between 65 and 70 years with 3 consecutive normal Pap tests and no abnormal Pap or HPV tests in the past 10 years.  HPV screening.** / Every 3 years from ages 57 years through ages 64 or 45 years with a history of 3 consecutive normal Pap tests. Testing can be stopped between 65 and 70 years with 3 consecutive normal Pap tests and no abnormal Pap or HPV tests in the past 10 years.  Fecal occult blood test (FOBT) of stool. / Every year beginning at age 15 years and continuing until age 17 years. You may not need to do this test if you get a colonoscopy every 10 years.  Flexible sigmoidoscopy or colonoscopy.** / Every 5 years for a flexible sigmoidoscopy or every 10 years for a colonoscopy beginning at age 86 years and continuing until age 71 years.  Hepatitis C blood test.** / For all people born from [birth year] through 1964 and any individual with known risks for hepatitis C.  Osteoporosis screening.** / A one-time screening for women ages 83 years and over and women at risk for fractures or osteoporosis.  Skin self-exam. / Monthly.  Influenza vaccine. / Every year.  Tetanus, diphtheria, and acellular pertussis (Tdap/Td) vaccine.** / 1 dose of Td every 10 years.  Varicella vaccine.** / Consult your health care provider.  Zoster vaccine.** / 1 dose for adults aged 61 years or older.  Pneumococcal 13-valent conjugate (PCV13) vaccine.** / Consult your health care provider.  Pneumococcal  polysaccharide (PPSV23) vaccine.** / 1 dose for all adults aged 28 years and older.  Meningococcal vaccine.** / Consult your health care provider.  Hepatitis A vaccine.** / Consult your health care provider.  Hepatitis B vaccine.** / Consult your health care provider.  Haemophilus influenzae type b (Hib) vaccine.** / Consult your health care provider. ** Family history and personal history of risk and conditions may change your health care provider's recommendations. Document Released: 09/10/2001 Document Revised: 11/29/2013 Document Reviewed: 12/10/2010 [facility] Patient Information 2015 [place], [place]. This information is not intended to replace advice given to you by your health care provider. Make sure you discuss any questions you have with your health care provider.

## 2015-01-17 NOTE — Assessment & Plan Note (Signed)
Encouraged heart healthy diet, increase exercise, avoid trans fats, consider a krill oil cap daily 

## 2015-01-17 NOTE — Progress Notes (Signed)
Pre visit review using our clinic review tool, if applicable. No additional management support is needed unless otherwise documented below in the visit note. 

## 2015-01-17 NOTE — Progress Notes (Signed)
Laura [name]  [id number] April 08, 1941 01/17/2015      Progress Note-Follow Up  Subjective  Chief Complaint  Chief Complaint  Patient presents with  . Annual Exam    HPI  Patient is a 74 y.o. female in today for routine medical care. Patient is in today for annual exam. She is complaining of some left hip pain. Tennis Must Denies CP/palp/SOB/HA/congestion/fevers/GI or GU c/o. Taking meds as prescribednies any falls or injury but the left hip pain is worsening. She is following closely with her eye doctor and has appointment with [facility] in July. She has been noticing roughly and nightly hot flash she knows it is it is usually many hours after she was eating anything. No recent illness. No acute concerns.  Past Medical History  Diagnosis Date  . Chicken pox as a child  . Measles as a child  . Mumps as a child  . Hypertension 20 yrs ago  . 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 Cataract   . 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 Hyperlipidemia   . Overweight(278.02)   . GERD (gastroesophageal reflux disease)   . Other and unspecified hyperlipidemia 10/13/2013    Past Surgical History  Procedure Laterality Date  . Vaginal hysterectomy  74 yrs old    uterus removed  . Eye surgery  11-27-2010    cataract surgery in both eyes  . 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 Abdominal hysterectomy  29 yrs ago    heavy bleeding, ovaries left in place    Family History  Problem Relation Age of Onset  . Heart disease Father   . Heart attack Father 67  . Hypertension Father   . Dementia Maternal Grandmother   . Heart disease Maternal Grandfather   . Hyperlipidemia Paternal Grandmother   . Cancer Paternal Grandfather     stomach  . Heart attack Paternal Grandfather   . Dementia Mother   . Pulmonary embolism Son     History   Social History  . Marital Status: Married    Spouse Name: N/A  . Number of Children: N/A  . Years of Education: N/A     Occupational History  . Not on file.   Social History Main Topics  . Smoking status: Former Smoker -- 1.00 packs/day for 16 years    Types: Cigarettes    Start date: 07/29/1972  . Smokeless tobacco: Never Used  . Alcohol Use: Yes     Comment: special occasions gllass of wine  . Drug Use: No  . Sexual Activity: Yes   Other Topics Concern  . Not on file   Social History Narrative    Current Outpatient Prescriptions on File Prior to Visit  Medication Sig Dispense Refill  . Fiber, Guar Gum, CHEW Chew by mouth as needed.    . Multiple Vitamins-Minerals (MULTI-VITAMIN GUMMIES) CHEW Chew 1 tablet by mouth daily.     No current facility-administered medications on file prior to visit.    Allergies  Allergen Reactions  . Lisinopril     dizzy  . Statins     Muscle soreness   . Sulfa Antibiotics Other (See Comments)    "feels feverish"  . Tetracyclines & Related     GI upset   Immunization Status: Flu vaccine-- 04/29/14 Tdap-- 2012 patient reported  PNA-- 04/28/13 (23) , 04/29/14 (13)  Shingles-- 04/28/13  A/P:  Changes to FH,  [place] or Personal Hx: son had PE this past year  Pap-- 2013 per patient normal  MMG-- 05/05/14 with Everlean Alstrom, MD at [place] [facility]: Neg Bone Density--  CCS-- 04/15/12 at [facility] - polyp removed (no f/u stated)   Care Teams Updated: No other providers per patient  ED/Hospital/Urgent Care Visits: None recent  Review of Systems  Review of Systems  Constitutional: Negative for fever, chills and malaise/fatigue.  HENT: Negative for congestion, hearing loss and nosebleeds.   Eyes: Negative for discharge.  Respiratory: Negative for cough, sputum production, shortness of breath and wheezing.   Cardiovascular: Negative for chest pain, palpitations and leg swelling.  Gastrointestinal: Negative for heartburn, nausea, vomiting, abdominal pain, diarrhea, constipation and blood in stool.  Genitourinary: Negative for dysuria,  urgency, frequency and hematuria.  Musculoskeletal: Positive for back pain and joint pain. Negative for myalgias and falls.  Skin: Negative for rash.  Neurological: Negative for dizziness, tremors, sensory change, focal weakness, loss of consciousness, weakness and headaches.  Endo/Heme/Allergies: Negative for polydipsia. Does not bruise/bleed easily.  Psychiatric/Behavioral: Positive for suicidal ideas. Negative for depression and memory loss. The patient is not nervous/anxious and does not have insomnia.     Objective  BP 122/72 mmHg  Pulse 87  Temp(Src) 98 F (36.7 C) (Oral)  Ht 5\' 4"  (1.626 m)  Wt 167 lb (75.751 kg)  BMI 28.65 kg/m2  SpO2 97%  Physical Exam  Physical Exam  Constitutional: She is oriented to person, place, and time and well-developed, well-nourished, and in no distress. No distress.  HENT:  Head: Normocephalic and atraumatic.  Right Ear: External ear normal.  Left Ear: External ear normal.  Nose: Nose normal.  Mouth/Throat: Oropharynx is clear and moist. No oropharyngeal exudate.  Eyes: Conjunctivae are normal. Pupils are equal, round, and reactive to light. Right eye exhibits no discharge. Left eye exhibits no discharge. No scleral icterus.  Neck: Normal range of motion. Neck supple. No thyromegaly present.  Cardiovascular: Normal rate, regular rhythm, normal heart sounds and intact distal pulses.   No murmur heard. Pulmonary/Chest: Effort normal and breath sounds normal. No respiratory distress. She has no wheezes. She has no rales.  Abdominal: Soft. Bowel sounds are normal. She exhibits no distension and no mass. There is no tenderness.  Musculoskeletal: Normal range of motion. She exhibits no edema or tenderness.  Lymphadenopathy:    She has no cervical adenopathy.  Neurological: She is alert and oriented to person, place, and time. She has normal reflexes. No cranial nerve deficit. Coordination normal.  Skin: Skin is warm and dry. No rash noted. She is  not diaphoretic.  Psychiatric: Mood, memory and affect normal.    Lab Results  Component Value Date   TSH 3.038 01/13/2014   Lab Results  Component Value Date   WBC 5.8 07/08/2014   HGB 14.1 07/08/2014   HCT 42.7 07/08/2014   MCV 89.1 07/08/2014   PLT 265.0 07/08/2014   Lab Results  Component Value Date   CREATININE 0.9 07/08/2014   BUN 22 07/08/2014   NA 137 07/08/2014   K 3.8 07/08/2014   CL 105 07/08/2014   CO2 27 07/08/2014   Lab Results  Component Value Date   ALT 12 01/13/2014   AST 15 01/13/2014   ALKPHOS 61 01/13/2014   BILITOT 0.4 01/13/2014   Lab Results  Component Value Date   CHOL 250* 07/08/2014   Lab Results  Component Value Date   HDL 50.40 07/08/2014   Lab  Results  Component Value Date   LDLCALC 170* 07/08/2014   Lab Results  Component Value Date   TRIG 150.0* 07/08/2014   Lab Results  Component Value Date   CHOLHDL 5 07/08/2014     Assessment & Plan  HTN (hypertension) Well controlled, no changes to meds. Encouraged heart healthy diet such as the DASH diet and exercise as tolerated.   Hyperlipidemia Encouraged heart healthy diet, increase exercise, avoid trans fats, consider a krill oil cap daily  Overweight Encouraged DASH diet, decrease po intake and increase exercise as tolerated. Needs 7-8 hours of sleep nightly. Avoid trans fats, eat small, frequent meals every 4-5 hours with lean proteins, complex carbs and healthy fats. Minimize simple carbs  Vitamin D deficiency Normal but low normal.encouraged daily OTC Vitamin D  Recurrent BCC (basal cell carcinoma) Follows with dermatology  Medicare annual wellness visit, subsequent Patient denies any difficulties at home. No trouble with ADLs, depression or falls. No recent changes to vision or hearing. Is UTD with immunizations. Is UTD with screening. Discussed Advanced Directives, patient agrees to bring [place] copies of documents if can. Encouraged heart healthy diet, exercise as  tolerated and adequate sleep.  See problem list for risk factors See AVS for preventative care recommendations. Allergies verified: UTD   Immunization Status: Flu vaccine-- 04/29/14 Tdap-- 2012 patient reported  PNA-- 04/28/13 (23) , 04/29/14 (13)  Shingles-- 04/28/13  A/P:  Changes to [place], [place] or Personal Hx: son had PE this past year  Pap-- 2013 per patient normal  MMG-- 05/05/14 with Everlean Alstrom, MD at [place] 1: Neg Bone Density--  CCS-- 04/15/12 at [facility] - polyp removed (no f/u stated)   Care Teams Updated: No other providers per patient  ED/Hospital/Urgent Care Visits: None recent

## 2015-01-17 NOTE — Assessment & Plan Note (Signed)
Well controlled, no changes to meds. Encouraged heart healthy diet such as the DASH diet and exercise as tolerated.  °

## 2015-01-20 ENCOUNTER — Telehealth: Payer: Self-pay | Admitting: Family Medicine

## 2015-01-20 NOTE — Telephone Encounter (Signed)
Relation to pt: self  Call back number: [phone]   Reason for call:  Pt inquiring about last results 01/17/2015

## 2015-01-20 NOTE — Telephone Encounter (Signed)
Patient informed of results and did mail a copy to her home.

## 2015-01-23 ENCOUNTER — Other Ambulatory Visit: Payer: Self-pay | Admitting: Family Medicine

## 2015-01-23 DIAGNOSIS — Z1231 Encounter for screening mammogram for malignant neoplasm of breast: Secondary | ICD-10-CM

## 2015-02-01 ENCOUNTER — Encounter: Payer: Self-pay | Admitting: Family Medicine

## 2015-02-01 DIAGNOSIS — Z0000 Encounter for general adult medical examination without abnormal findings: Secondary | ICD-10-CM

## 2015-02-01 DIAGNOSIS — H524 Presbyopia: Secondary | ICD-10-CM | POA: Diagnosis not present

## 2015-02-01 DIAGNOSIS — H35372 Puckering of macula, left eye: Secondary | ICD-10-CM | POA: Diagnosis not present

## 2015-02-01 HISTORY — DX: Encounter for general adult medical examination without abnormal findings: Z00.00

## 2015-02-01 NOTE — Assessment & Plan Note (Signed)
Encouraged DASH diet, decrease po intake and increase exercise as tolerated. Needs 7-8 hours of sleep nightly. Avoid trans fats, eat small, frequent meals every 4-5 hours with lean proteins, complex carbs and healthy fats. Minimize simple carbs 

## 2015-02-01 NOTE — Assessment & Plan Note (Signed)
Patient denies any difficulties at home. No trouble with ADLs, depression or falls. No recent changes to vision or hearing. Is UTD with immunizations. Is UTD with screening. Discussed Advanced Directives, patient agrees to bring [place] copies of documents if can. Encouraged heart healthy diet, exercise as tolerated and adequate sleep.  See problem list for risk factors See AVS for preventative care recommendations. Allergies verified: UTD   Immunization Status: Flu vaccine-- 04/29/14 Tdap-- 2012 patient reported  PNA-- 04/28/13 (23) , 04/29/14 (13)  Shingles-- 04/28/13  A/P:  Changes to [place], [place] or Personal Hx: son had PE this past year  Pap-- 2013 per patient normal  MMG-- 05/05/14 with Everlean Alstrom, MD at [place] 1: Neg Bone Density--  CCS-- 04/15/12 at [facility] - polyp removed (no f/u stated)   Care Teams Updated: No other providers per patient  ED/Hospital/Urgent Care Visits: None recent

## 2015-02-01 NOTE — Assessment & Plan Note (Signed)
Follows with dermatology 

## 2015-02-01 NOTE — Assessment & Plan Note (Signed)
Normal but low normal.encouraged daily OTC Vitamin D

## 2015-02-13 DIAGNOSIS — Z08 Encounter for follow-up examination after completed treatment for malignant neoplasm: Secondary | ICD-10-CM | POA: Diagnosis not present

## 2015-02-13 DIAGNOSIS — L820 Inflamed seborrheic keratosis: Secondary | ICD-10-CM | POA: Diagnosis not present

## 2015-02-13 DIAGNOSIS — Z85828 Personal history of other malignant neoplasm of skin: Secondary | ICD-10-CM | POA: Diagnosis not present

## 2015-02-13 DIAGNOSIS — L720 Epidermal cyst: Secondary | ICD-10-CM | POA: Diagnosis not present

## 2015-02-22 ENCOUNTER — Telehealth: Payer: Self-pay | Admitting: Family Medicine

## 2015-02-22 NOTE — Telephone Encounter (Signed)
Pt called stating left leg is not getting better and previously discussed referral to sports med. She would like referral to Dr. Concha Norway at [facility] where her husband is going.

## 2015-02-23 ENCOUNTER — Other Ambulatory Visit: Payer: Self-pay | Admitting: Family Medicine

## 2015-02-23 DIAGNOSIS — M25552 Pain in left hip: Principal | ICD-10-CM

## 2015-02-23 DIAGNOSIS — G8929 Other chronic pain: Secondary | ICD-10-CM

## 2015-03-08 ENCOUNTER — Ambulatory Visit: Payer: Medicare Other | Attending: Orthopedic Surgery | Admitting: Physical Therapy

## 2015-03-08 DIAGNOSIS — R29898 Other symptoms and signs involving the musculoskeletal system: Secondary | ICD-10-CM | POA: Diagnosis not present

## 2015-03-08 DIAGNOSIS — M25652 Stiffness of left hip, not elsewhere classified: Secondary | ICD-10-CM | POA: Diagnosis not present

## 2015-03-08 DIAGNOSIS — M25552 Pain in left hip: Secondary | ICD-10-CM | POA: Insufficient documentation

## 2015-03-08 NOTE — Therapy (Signed)
[facility] [address]  [place], [place], [address] Phone: [phone]   Fax:  [phone]  Physical Therapy Evaluation  Patient Details  Name: Laura [name] MRN: [id number] Date of Birth: Sep 01, 1940 Referring Provider:  Marchia Bond, MD  Encounter Date: 03/08/2015      PT End of Session - 03/08/15 1028    Visit Number 1   Number of Visits 16   Date for PT Re-Evaluation 05/03/15   PT Start Time 0930   PT Stop Time 1017   PT Time Calculation (min) 47 min   Activity Tolerance Patient tolerated treatment well   Behavior During Therapy [facility] for tasks assessed/performed      Past Medical History  Diagnosis Date  . Chicken pox as a child  . Measles as a child  . Mumps as a child  . Hypertension 20 yrs ago  . 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 Cataract   . 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 Hyperlipidemia   . Overweight(278.02)   . GERD (gastroesophageal reflux disease)   . Other and unspecified hyperlipidemia 10/13/2013  . Fibrocystic breast 05/01/2014  . Medicare annual wellness visit, subsequent 02/01/2015    Past Surgical History  Procedure Laterality Date  . Vaginal hysterectomy  74 yrs old    uterus removed  . Eye surgery  11-27-2010    cataract surgery in both eyes  . 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 Abdominal hysterectomy  29 yrs ago    heavy bleeding, ovaries left in place    There were no vitals filed for this visit.  Visit Diagnosis:  Lateral pain of left hip  Stiffness of left hip joint  Weakness of left hip      Subjective Assessment - 03/08/15 0934    Subjective Patient reports ~25-30 yrs ago was treated for trochanteric bursitis with mutiple injections without significant improvement. MD at the time opted to do surgery to remove bursa. Patient started having pain and stiffness again within the last year.   Pertinent History L  Trochanteric bursitis ~25-30 yrs ago treated with repeated injections and surgical removal of the bursa (no therapy)   Limitations Sitting   How long can you sit comfortably? varies   Patient Stated Goals To get rid of the pain and stiffness   Currently in Pain? Yes   Pain Score 2   Least 2/10, Avg 7/10, Worst 10/10   Pain Location Hip   Pain Orientation Left   Pain Descriptors / Indicators Other (Comment);Aching  "pain in the ass"   Pain Type Chronic pain   Pain Radiating Towards down lateral thigh to side of knee   Pain Onset More than a month ago  uncertain, but within last year   Pain Frequency Constant   Aggravating Factors  sitting, climbing stairs   Pain Relieving Factors Ibuprofen   Effect of Pain on Daily Activities "keep going through the pain"            [facility] PT Assessment - 03/08/15 0945    Assessment   Medical Diagnosis Left ITB syndrome   Next MD Visit 03/15/15   Prior Therapy none   Balance Screen   Has the patient fallen in the past 6 months Yes   How many times? 1  tripped over hose while watering plants   Has the patient had a  decrease in activity level because of a fear of falling?  No   Is the patient reluctant to leave their home because of a fear of falling?  No   Home Environment   Living Environment Private residence   Living Arrangements Spouse/significant other   Type of [place] Access Level entry   Home Layout Two level   Alternate Level Stairs-Number of Steps 13   Alternate Level Stairs-Rails Right   Prior Function   Level of Independence Independent   Vocation Retired   Leisure Curator, shopping   Observation/Other Assessments   Focus on Therapeutic Outcomes (FOTO)  49% (51% limitation); predicted 57% (43% limitation)   ROM / Strength   AROM / PROM / Strength AROM;Strength   AROM   Overall AROM Comments LE ROM WNL   Strength   Strength Assessment Site Hip;Knee   Right/Left Hip Right;Left   Right Hip Flexion 4/5    Right Hip Extension 4/5   Right Hip ABduction 4/5   Right Hip ADduction 4/5   Left Hip Flexion 4-/5   Left Hip Extension 3+/5   Left Hip ABduction 3+/5   Left Hip ADduction 4-/5   Right/Left Knee Right;Left   Right Knee Flexion 5/5   Right Knee Extension 5/5   Left Knee Flexion 4-/5   Left Knee Extension 4/5   Flexibility   Soft Tissue Assessment /Muscle Length yes   Hamstrings mildly tight on left   ITB bilateral tighness L > R   Piriformis moderate tightness of left   Special Tests    Special Tests Hip Special Tests   Hip Special Tests  Ober's Test;Patrick (FABER) Test;Piriformis Test;Other   Saralyn Pilar (FABER) Test   Findings Positive   Side Left   Ober's Test   Findings Positive   Side Left;Right   Comments L > R   Piriformis Test   Findings Positive   Side  Left   other   Findings Positive   Side Left   Comments Noble Compression test                   [facility] Adult PT Treatment/Exercise - 03/08/15 0945    Exercises   Exercises Knee/Hip   Lumbar Exercises: Stretches   Single Knee to Chest Stretch 30 seconds;2 reps   Knee/Hip Exercises: Stretches   ITB Stretch Left;20 seconds;2 reps   ITB Stretch Limitations supine & standing   Piriformis Stretch Left;20 seconds;2 reps   Piriformis Stretch Limitations supine                PT Education - 03/08/15 1335    Education provided Yes   Education Details Initial stretching HEP - ITB, piriformis, SKTC   Person(s) Educated Patient   Methods Explanation;Demonstration;Handout   Comprehension Verbalized understanding;Returned demonstration;Need further instruction          PT Short Term Goals - 03/08/15 1351    PT SHORT TERM GOAL #1   Title Independent with initial HEP (04/05/15)   Time 4   Period Weeks   Status New           PT Long Term Goals - 03/08/15 1352    PT LONG TERM GOAL #1   Title Independent with advanced HEP (05/03/15)   Time 8   Period Weeks   Status New   PT LONG TERM GOAL  #2   Title Patient will report hip/lateral thigh pain no greater than 4/10 (05/03/15)   Time 8  Period Weeks   Status New   PT LONG TERM GOAL #3   Title Patient will be able to climb stairs reciprocally without increased pain (05/02/14)   Time 8   Period Weeks   Status New   PT LONG TERM GOAL #4   Title Bilateral hip and knee strength >/= 4+/5 without pain (05/03/15)   Time 8   Period Weeks   Status New               Plan - 13-Mar-2015 1336    Clinical Impression Statement 74 y/o female presents to OP PT with c/o pain and stiffness in left hip and lateral thigh. Patient reports similar episode ~25-30 yrs ago which was identified at the time as trochanteric bursitis and treated with repeated injections followed by surgical removal of the bursa. No therapy prescribed at that time. Patient reports recurrence of similar symptoms within the past year but deferred seeing MD as she was helping husband with recovery after surgery. Patient states pain most noticeable upon first rising from sitting and when climbing stairs and can vary in intnsity from 2 to 10/10. Assessment revealed tightness in hip muscluature left much greater than right, most pronounced in ITB and piriformis. Patient with functional ROM but limited strength in bilateral hips, left > right, and left knee.   Pt will benefit from skilled therapeutic intervention in order to improve on the following deficits Pain;Impaired flexibility;Decreased mobility;Difficulty walking;Decreased activity tolerance;Decreased strength   Rehab Potential Good   PT Frequency 2x / week   PT Duration 8 weeks   PT Treatment/Interventions Manual techniques;Therapeutic exercise;Therapeutic activities;Taping;Dry needling;Electrical Stimulation;Ultrasound;Iontophoresis 4mg /ml Dexamethasone;Cryotherapy;Gait training;Stair training;Balance training;Patient/family education   PT Next Visit Plan Review of HEP stretches, Hip/knee/core strengthening, modalities PRN    PT Home Exercise Plan Stretching HEP initiatied, with progression to include strengthening exercises   Consulted and Agree with Plan of Care Patient          G-Codes - 03-13-2015 1400    Functional Assessment Tool Used FOTO = 49% (51% limitation)   Functional Limitation Mobility: Walking and moving around   Mobility: Walking and Moving Around Current Status (D1497) At least 40 percent but less than 60 percent impaired, limited or restricted   Mobility: Walking and Moving Around Goal Status (W2637) At least 40 percent but less than 60 percent impaired, limited or restricted  Predicted FOTO = 57% (43% limited)       Problem List Patient Active Problem List   Diagnosis Date Noted  . Medicare annual wellness visit, subsequent 02/01/2015  . Muscle strain 08/11/2014  . Hyperlipidemia 07/18/2014  . Fibrocystic breast 05/01/2014  . 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 Decreased visual acuity 01/23/2014  . HTN (hypertension) 01/23/2014  . Sun-damaged skin 01/23/2014  . Hot flashes 01/23/2014  . Recurrent BCC (basal cell carcinoma) 10/13/2013  . Vitamin D deficiency 10/13/2013  . Pain in joint, ankle and foot 10/13/2013  . Cancer   . Cataract   . EBV infection   . Overweight     Percival Spanish, PT, MPT 2015-03-13, 2:02 PM  [facility] [address]  [place], [place], [address] Phone: [phone]   Fax:  [phone]

## 2015-03-10 ENCOUNTER — Ambulatory Visit: Payer: Medicare Other | Admitting: Physical Therapy

## 2015-03-10 DIAGNOSIS — M25652 Stiffness of left hip, not elsewhere classified: Secondary | ICD-10-CM | POA: Diagnosis not present

## 2015-03-10 DIAGNOSIS — M25552 Pain in left hip: Secondary | ICD-10-CM

## 2015-03-10 DIAGNOSIS — R29898 Other symptoms and signs involving the musculoskeletal system: Secondary | ICD-10-CM

## 2015-03-10 NOTE — Therapy (Signed)
[facility] [address]  [place], [place], [address] Phone: [phone]   Fax:  [phone]  Physical Therapy Treatment  Patient Details  Name: Laura [name] MRN: [id number] Date of Birth: 1941-03-28 Referring Provider:  Marchia Bond, MD  Encounter Date: 03/10/2015      PT End of Session - 03/10/15 1154    Visit Number 2   Number of Visits 16   Date for PT Re-Evaluation 05/03/15   PT Start Time 1101   PT Stop Time 1142   PT Time Calculation (min) 41 min   Activity Tolerance Patient tolerated treatment well   Behavior During Therapy [facility] for tasks assessed/performed      Past Medical History  Diagnosis Date  . Chicken pox as a child  . Measles as a child  . Mumps as a child  . Hypertension 20 yrs ago  . 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 Cataract   . 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 Hyperlipidemia   . Overweight(278.02)   . GERD (gastroesophageal reflux disease)   . Other and unspecified hyperlipidemia 10/13/2013  . Fibrocystic breast 05/01/2014  . Medicare annual wellness visit, subsequent 02/01/2015    Past Surgical History  Procedure Laterality Date  . Vaginal hysterectomy  74 yrs old    uterus removed  . Eye surgery  11-27-2010    cataract surgery in both eyes  . 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 Abdominal hysterectomy  29 yrs ago    heavy bleeding, ovaries left in place    There were no vitals filed for this visit.  Visit Diagnosis:  Lateral pain of left hip  Stiffness of left hip joint  Weakness of left hip      Subjective Assessment - 03/10/15 1110    Subjective Reports limited attempts to perform HEP and has questions about the "supine exercise" (supine ITB stretch).   Currently in Pain? No/denies                 [facility] Adult PT Treatment/Exercise - 03/10/15 1101    Exercises   Exercises Knee/Hip   Lumbar  Exercises: Stretches   Single Knee to Chest Stretch 30 seconds;2 reps   Knee/Hip Exercises: Stretches   Hip Flexor Stretch 30 seconds;Left   Hip Flexor Stretch Limitations Modified thomas position   ITB Stretch Left;20 seconds;2 reps   ITB Stretch Limitations supine & standing   Piriformis Stretch Left;20 seconds;2 reps   Piriformis Stretch Limitations supine   Other Knee/Hip Stretches Manual ITB stretch in Rt sidelying 3x30"   Knee/Hip Exercises: Aerobic   Nustep lvl 3 x 3' (LE only)   Knee/Hip Exercises: Supine   Short Arc [employer] Left;10 reps   Short Arc Quad Sets Limitations small ball btw knees, 3" hold   Hip Adduction Isometric Strengthening;10 reps   Hip Adduction Isometric Limitations Hooklying with ball btw knees, 3" hold   Straight Leg Raises Left  only 6 reps secondary to fatigue/"ache"   Straight Leg Raises Limitations 3" hold   Patellar Mobs medial and inferior glides   Other Supine Knee/Hip Exercises Hooklying hip Abduction clams with Green TB 10x3"   Knee/Hip Exercises: Sidelying   Clams Green TB x10 with 3" hold   Manual Therapy   Manual Therapy Soft tissue mobilization   Soft tissue mobilization Rt sidelying STM and strumming to  Lt ITB                  PT Short Term Goals - 03/08/15 1351    PT SHORT TERM GOAL #1   Title Independent with initial HEP (04/05/15)   Time 4   Period Weeks   Status New           PT Long Term Goals - 03/08/15 1352    PT LONG TERM GOAL #1   Title Independent with advanced HEP (05/03/15)   Time 8   Period Weeks   Status New   PT LONG TERM GOAL #2   Title Patient will report hip/lateral thigh pain no greater than 4/10 (05/03/15)   Time 8   Period Weeks   Status New   PT LONG TERM GOAL #3   Title Patient will be able to climb stairs reciprocally without increased pain (05/02/14)   Time 8   Period Weeks   Status New   PT LONG TERM GOAL #4   Title Bilateral hip and knee strength >/= 4+/5 without pain (05/03/15)    Time 8   Period Weeks   Status New               Plan - 03/10/15 1155    Clinical Impression Statement Patient requiring signifcant cueing/repeated instruction for proper performance of supine stretches, especially for gradual release of stretch rather than just dropping the leg. Tolerance for exercises limited mostly by fatigue, altough some c/o "ache" (unable to qualify if pain or muscle fatigue).   PT Next Visit Plan Review of HEP stretches, Hip/knee/core strengthening, modalities PRN   Consulted and Agree with Plan of Care Patient        Problem List Patient Active Problem List   Diagnosis Date Noted  . Medicare annual wellness visit, subsequent 02/01/2015  . Muscle strain 08/11/2014  . Hyperlipidemia 07/18/2014  . Fibrocystic breast 05/01/2014  . 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 Decreased visual acuity 01/23/2014  . HTN (hypertension) 01/23/2014  . Sun-damaged skin 01/23/2014  . Hot flashes 01/23/2014  . Recurrent BCC (basal cell carcinoma) 10/13/2013  . Vitamin D deficiency 10/13/2013  . Pain in joint, ankle and foot 10/13/2013  . Cancer   . Cataract   . EBV infection   . Overweight     Percival Spanish, PT, MPT 03/10/2015, 12:04 PM  [facility] [address]  [place], [place], [address] Phone: [phone]   Fax:  [phone]

## 2015-03-14 ENCOUNTER — Ambulatory Visit: Payer: Medicare Other | Admitting: Rehabilitation

## 2015-03-14 DIAGNOSIS — M25552 Pain in left hip: Secondary | ICD-10-CM | POA: Diagnosis not present

## 2015-03-14 DIAGNOSIS — M25652 Stiffness of left hip, not elsewhere classified: Secondary | ICD-10-CM

## 2015-03-14 DIAGNOSIS — R29898 Other symptoms and signs involving the musculoskeletal system: Secondary | ICD-10-CM | POA: Diagnosis not present

## 2015-03-14 NOTE — Therapy (Signed)
[facility] [address]  [place], [place], [address] Phone: [phone]   Fax:  [phone]  Physical Therapy Treatment  Patient Details  Name: Derotha Fishbaugh MRN: [id number] Date of Birth: Mar 03, 1941 Referring Provider:  Marchia Bond, MD  Encounter Date: 03/14/2015      PT End of Session - 03/14/15 1012    Visit Number 3   Number of Visits 16   Date for PT Re-Evaluation 05/03/15   PT Start Time 0936   PT Stop Time 1015   PT Time Calculation (min) 39 min   Activity Tolerance Patient tolerated treatment well   Behavior During Therapy [facility] for tasks assessed/performed      Past Medical History  Diagnosis Date  . Chicken pox as a child  . Measles as a child  . Mumps as a child  . Hypertension 20 yrs ago  . 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 Cataract   . 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 Hyperlipidemia   . Overweight(278.02)   . GERD (gastroesophageal reflux disease)   . Other and unspecified hyperlipidemia 10/13/2013  . Fibrocystic breast 05/01/2014  . Medicare annual wellness visit, subsequent 02/01/2015    Past Surgical History  Procedure Laterality Date  . Vaginal hysterectomy  74 yrs old    uterus removed  . Eye surgery  11-27-2010    cataract surgery in both eyes  . 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 Abdominal hysterectomy  74 yrs ago    heavy bleeding, ovaries left in place    There were no vitals filed for this visit.  Visit Diagnosis:  Lateral pain of left hip  Stiffness of left hip joint  Weakness of left hip      Subjective Assessment - 03/14/15 0938    Subjective Notes most pain with driving. Does feel like she is improving.    Currently in Pain? No/denies                         [facility] Adult PT Treatment/Exercise - 03/14/15 0945    Lumbar Exercises: Stretches   Single Knee to Chest Stretch 30 seconds;2 reps    Single Knee to Chest Stretch Limitations manually   Knee/Hip Exercises: Stretches   ITB Stretch 20 seconds;2 reps;Both   ITB Stretch Limitations supine manually   Piriformis Stretch 20 seconds;2 reps;Both   Piriformis Stretch Limitations supine manually   Knee/Hip Exercises: Supine   Terminal Knee Extension Both  QS+ hip extension iso into tale   Hip Adduction Isometric Strengthening;10 reps   Hip Adduction Isometric Limitations Hooklying with ball btw knees, 5" hold   Straight Leg Raises Both;10 reps   Straight Leg Raises Limitations 3" hold   Other Supine Knee/Hip Exercises Hooklying hip Abduction clams with Green TB 10x3"   Other Supine Knee/Hip Exercises Hooklying march 10x3"   Knee/Hip Exercises: Sidelying   Clams Green TB x10 with 3" hold   Manual Therapy   Manual Therapy Soft tissue mobilization   Soft tissue mobilization Rt sidelying STM and strumming to Lt ITB                  PT Short Term Goals - 03/08/15 1351    PT SHORT TERM GOAL #1   Title Independent with initial HEP (04/05/15)   Time 4   Period  Weeks   Status New           PT Long Term Goals - 03/08/15 1352    PT LONG TERM GOAL #1   Title Independent with advanced HEP (05/03/15)   Time 8   Period Weeks   Status New   PT LONG TERM GOAL #2   Title Patient will report hip/lateral thigh pain no greater than 4/10 (05/03/15)   Time 8   Period Weeks   Status New   PT LONG TERM GOAL #3   Title Patient will be able to climb stairs reciprocally without increased pain (05/02/14)   Time 8   Period Weeks   Status New   PT LONG TERM GOAL #4   Title Bilateral hip and knee strength >/= 4+/5 without pain (05/03/15)   Time 8   Period Weeks   Status New               Plan - 03/14/15 1013    Clinical Impression Statement Good tolerance to treatment today and performed manual stretches today. Pt reports pain/stiffness along her ITB after manual work and instructed her to put heat on it later if it  is still tender.    PT Next Visit Plan Review of HEP stretches, Hip/knee/core strengthening, modalities PRN   Consulted and Agree with Plan of Care Patient        Problem List Patient Active Problem List   Diagnosis Date Noted  . Medicare annual wellness visit, subsequent 02/01/2015  . Muscle strain 08/11/2014  . Hyperlipidemia 07/18/2014  . Fibrocystic breast 05/01/2014  . 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 Decreased visual acuity 01/23/2014  . HTN (hypertension) 01/23/2014  . Sun-damaged skin 01/23/2014  . Hot flashes 01/23/2014  . Recurrent BCC (basal cell carcinoma) 10/13/2013  . Vitamin D deficiency 10/13/2013  . Pain in joint, ankle and foot 10/13/2013  . Cancer   . Cataract   . EBV infection   . Overweight     Barbette Hair, [place] 03/14/2015, 10:15 AM  [facility] [address]  [place], [place], [address] Phone: [phone]   Fax:  [phone]

## 2015-03-15 DIAGNOSIS — M1712 Unilateral primary osteoarthritis, left knee: Secondary | ICD-10-CM | POA: Diagnosis not present

## 2015-03-17 ENCOUNTER — Ambulatory Visit: Payer: Medicare Other | Admitting: Physical Therapy

## 2015-03-17 DIAGNOSIS — R29898 Other symptoms and signs involving the musculoskeletal system: Secondary | ICD-10-CM | POA: Diagnosis not present

## 2015-03-17 DIAGNOSIS — M25552 Pain in left hip: Secondary | ICD-10-CM | POA: Diagnosis not present

## 2015-03-17 DIAGNOSIS — M25652 Stiffness of left hip, not elsewhere classified: Secondary | ICD-10-CM

## 2015-03-17 NOTE — Therapy (Signed)
[facility] [address]  [place], [place], [address] Phone: [phone]   Fax:  [phone]  Physical Therapy Treatment  Patient Details  Name: Lorelle Macaluso MRN: [id number] Date of Birth: 1940-12-18 Referring Provider:  Marchia Bond, MD  Encounter Date: 03/17/2015      PT End of Session - 03/17/15 1046    Visit Number 4   Number of Visits 16   Date for PT Re-Evaluation 05/03/15   PT Start Time 1017   PT Stop Time 1108   PT Time Calculation (min) 51 min   Activity Tolerance Patient tolerated treatment well   Behavior During Therapy [facility] for tasks assessed/performed      Past Medical History  Diagnosis Date  . Chicken pox as a child  . Measles as a child  . Mumps as a child  . Hypertension 20 yrs ago  . 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 Cataract   . 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 Hyperlipidemia   . Overweight(278.02)   . GERD (gastroesophageal reflux disease)   . Other and unspecified hyperlipidemia 10/13/2013  . Fibrocystic breast 05/01/2014  . Medicare annual wellness visit, subsequent 02/01/2015    Past Surgical History  Procedure Laterality Date  . Vaginal hysterectomy  74 yrs old    uterus removed  . Eye surgery  11-27-2010    cataract surgery in both eyes  . 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 Abdominal hysterectomy  29 yrs ago    heavy bleeding, ovaries left in place    There were no vitals filed for this visit.  Visit Diagnosis:  Lateral pain of left hip  Stiffness of left hip joint  Weakness of left hip      Subjective Assessment - 03/17/15 1021    Subjective Patient reports Shelby (PTA) "killed me last time" - reports soreness from the massage that last through the evening and the next day, but better now. Reports soreness after being on feet for long periods of time.   Currently in Pain? No/denies                 [facility] Adult PT Treatment/Exercise - 03/17/15 0001    Exercises   Exercises Knee/Hip   Lumbar Exercises: Stretches   Single Knee to Chest Stretch 30 seconds;2 reps   Single Knee to Chest Stretch Limitations manually   Knee/Hip Exercises: Stretches   ITB Stretch 20 seconds;2 reps;Both   ITB Stretch Limitations supine manually   Piriformis Stretch 20 seconds;2 reps;Both   Piriformis Stretch Limitations supine manually   Other Knee/Hip Stretches Manual ITB stretch in Rt sidelying 3x30"   Knee/Hip Exercises: Aerobic   Recumbent Bike lvl 1 x 4'   Knee/Hip Exercises: Supine   Terminal Knee Extension Both;10 reps  QS+ hip extension iso into with 4" foam roll under ankles   Hip Adduction Isometric Strengthening;10 reps   Hip Adduction Isometric Limitations Hooklying with ball btw knees, 5" hold   Bridges with [employer];Both  6 reps with 3" hold   Straight Leg Raises Both;10 reps   Straight Leg Raises Limitations 3" hold   Other Supine Knee/Hip Exercises Hooklying DL & SL hip Abduction clams with Green TB 10x3"   Other Supine Knee/Hip Exercises Hooklying march 10x3"   Knee/Hip Exercises: Sidelying   Clams Green TB x10 with 3" hold  Modalities   Modalities Cryotherapy   Cryotherapy   Number Minutes Cryotherapy 8 Minutes   Cryotherapy Location Hip  Left lateral thigh in Rt sidelying   Type of Cryotherapy Ice pack   Manual Therapy   Manual Therapy Soft tissue mobilization   Soft tissue mobilization Rt sidelying STM and strumming to Lt ITB                  PT Short Term Goals - 03/17/15 1208    PT SHORT TERM GOAL #1   Title Independent with initial HEP (04/05/15)   Status On-going           PT Long Term Goals - 03/17/15 1208    PT LONG TERM GOAL #1   Title Independent with advanced HEP (05/03/15)   Status On-going   PT LONG TERM GOAL #2   Title Patient will report hip/lateral thigh pain no greater than 4/10 (05/03/15)   Status On-going   PT LONG  TERM GOAL #3   Title Patient will be able to climb stairs reciprocally without increased pain (05/02/14)   Status On-going   PT LONG TERM GOAL #4   Title Bilateral hip and knee strength >/= 4+/5 without pain (05/03/15)   Status On-going               Plan - 03/17/15 1210    Clinical Impression Statement Improving flexibility with patient reporting decreased pain, especially upon rising and initiating movement after sitting for prolonged period (patient stating no longer limping for first several steps). Decreased intensity of STM/manual therapy after c/o increased pain after last visit and continued tenderness in left ITB. Applied ice pack after STM to reduce pain/inflammation and reminded patient to use heat later if still tender.   PT Next Visit Plan Hip/ITB stretches, Hip/knee/core strengthening, STM/manual therapy, modalities PRN   Consulted and Agree with Plan of Care Patient        Problem List Patient Active Problem List   Diagnosis Date Noted  . Medicare annual wellness visit, subsequent 02/01/2015  . Muscle strain 08/11/2014  . Hyperlipidemia 07/18/2014  . Fibrocystic breast 05/01/2014  . 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 Decreased visual acuity 01/23/2014  . HTN (hypertension) 01/23/2014  . Sun-damaged skin 01/23/2014  . Hot flashes 01/23/2014  . Recurrent BCC (basal cell carcinoma) 10/13/2013  . Vitamin D deficiency 10/13/2013  . Pain in joint, ankle and foot 10/13/2013  . Cancer   . Cataract   . EBV infection   . Overweight     Percival Spanish, PT, MPT 03/17/2015, 12:17 PM  [facility] [address]  [place], [place], [address] Phone: [phone]   Fax:  [phone]

## 2015-03-21 ENCOUNTER — Ambulatory Visit: Payer: Medicare Other | Admitting: Rehabilitation

## 2015-03-21 DIAGNOSIS — M25652 Stiffness of left hip, not elsewhere classified: Secondary | ICD-10-CM

## 2015-03-21 DIAGNOSIS — R29898 Other symptoms and signs involving the musculoskeletal system: Secondary | ICD-10-CM | POA: Diagnosis not present

## 2015-03-21 DIAGNOSIS — M25552 Pain in left hip: Secondary | ICD-10-CM

## 2015-03-21 NOTE — Therapy (Signed)
[facility] [address]  [place], [place], [address] Phone: [phone]   Fax:  [phone]  Physical Therapy Treatment  Patient Details  Name: Laura [name] MRN: [id number] Date of Birth: 1940/12/12 Referring Provider:  Marchia Bond, MD  Encounter Date: 73/23/2016      PT End of Session - 03/21/15 0928    Visit Number 5   Number of Visits 16   Date for PT Re-Evaluation 05/03/15   PT Start Time 0925   PT Stop Time 1015   PT Time Calculation (min) 50 min      Past Medical History  Diagnosis Date  . Chicken pox as a child  . Measles as a child  . Mumps as a child  . Hypertension 20 yrs ago  . 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 Cataract   . 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 Hyperlipidemia   . Overweight(278.02)   . GERD (gastroesophageal reflux disease)   . Other and unspecified hyperlipidemia 10/13/2013  . Fibrocystic breast 05/01/2014  . Medicare annual wellness visit, subsequent 02/01/2015    Past Surgical History  Procedure Laterality Date  . Vaginal hysterectomy  74 yrs old    uterus removed  . Eye surgery  11-27-2010    cataract surgery in both eyes  . 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 Abdominal hysterectomy  74 yrs ago    heavy bleeding, ovaries left in place    There were no vitals filed for this visit.  Visit Diagnosis:  Lateral pain of left hip  Stiffness of left hip joint  Weakness of left hip      Subjective Assessment - 03/21/15 0926    Subjective Felt fine after last time, especially with the ice. Reports her leg is feeling 100% better. States she can now get out of bed or out of a chair without tightness or pulling.    Currently in Pain? No/denies  Reports pain over the weekend on average a 4/10                         [facility] Adult PT Treatment/Exercise - 03/21/15 0928    Exercises   Exercises Knee/Hip    Lumbar Exercises: Stretches   Single Knee to Chest Stretch 30 seconds;2 reps   Single Knee to Chest Stretch Limitations manually   Knee/Hip Exercises: Stretches   ITB Stretch 20 seconds;2 reps;Both   ITB Stretch Limitations supine manually   Piriformis Stretch 20 seconds;2 reps;Both   Piriformis Stretch Limitations supine manually   Other Knee/Hip Stretches Manual ITB stretch in Rt sidelying 3x30"   Knee/Hip Exercises: Aerobic   Recumbent Bike lvl 1 x 4'   Knee/Hip Exercises: Supine   Terminal Knee Extension Both;10 reps  QS+ Hip ext iso into 6" FR under ankles   Hip Adduction Isometric Strengthening;10 reps   Hip Adduction Isometric Limitations Hooklying with Green TB   Bridges with [employer];Both  5 x3"   Straight Leg Raises Both;10 reps   Straight Leg Raises Limitations 3" hold   Other Supine Knee/Hip Exercises Hooklying SL hip Abduction clams with Green TB 10x3"   Other Supine Knee/Hip Exercises Hooklying march 10x3"   Knee/Hip Exercises: Sidelying   Hip ADduction AROM;10 reps   Clams Green TB x10 with 3" hold   Cryotherapy   Number  Minutes Cryotherapy 8 Minutes   Cryotherapy Location Hip  Lt lateral thigh in Rt side-lying   Type of Cryotherapy Ice pack   Manual Therapy   Manual Therapy Soft tissue mobilization   Soft tissue mobilization Rt sidelying STM and strumming to Lt ITB                  PT Short Term Goals - 03/17/15 1208    PT SHORT TERM GOAL #1   Title Independent with initial HEP (04/05/15)   Status On-going           PT Long Term Goals - 03/17/15 1208    PT LONG TERM GOAL #1   Title Independent with advanced HEP (05/03/15)   Status On-going   PT LONG TERM GOAL #2   Title Patient will report hip/lateral thigh pain no greater than 4/10 (05/03/15)   Status On-going   PT LONG TERM GOAL #3   Title Patient will be able to climb stairs reciprocally without increased pain (05/02/14)   Status On-going   PT LONG TERM GOAL #4    Title Bilateral hip and knee strength >/= 4+/5 without pain (05/03/15)   Status On-going               Plan - 03/21/15 1009    Clinical Impression Statement Pt was questioning how much therapy she needs before she is done and explained to her it depends on her progress and if she wants to contine. Less tenderness noted today with ITB STM and pt reports all round improvement.    PT Next Visit Plan Hip/ITB stretches, Hip/knee/core strengthening, STM/manual therapy, modalities PRN   Consulted and Agree with Plan of Care Patient        Problem List Patient Active Problem List   Diagnosis Date Noted  . Medicare annual wellness visit, subsequent 02/01/2015  . Muscle strain 08/11/2014  . Hyperlipidemia 07/18/2014  . Fibrocystic breast 05/01/2014  . 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 Decreased visual acuity 01/23/2014  . HTN (hypertension) 01/23/2014  . Sun-damaged skin 01/23/2014  . Hot flashes 01/23/2014  . Recurrent BCC (basal cell carcinoma) 10/13/2013  . Vitamin D deficiency 10/13/2013  . Pain in joint, ankle and foot 10/13/2013  . Cancer   . Cataract   . EBV infection   . Overweight     Barbette Hair, [place] 03/21/2015, 10:12 AM  [facility] [address]  [place], [place], [address] Phone: [phone]   Fax:  [phone]

## 2015-03-24 ENCOUNTER — Ambulatory Visit: Payer: Medicare Other | Admitting: Physical Therapy

## 2015-03-24 DIAGNOSIS — R29898 Other symptoms and signs involving the musculoskeletal system: Secondary | ICD-10-CM

## 2015-03-24 DIAGNOSIS — M25652 Stiffness of left hip, not elsewhere classified: Secondary | ICD-10-CM

## 2015-03-24 DIAGNOSIS — M25552 Pain in left hip: Secondary | ICD-10-CM

## 2015-03-24 NOTE — Therapy (Addendum)
[facility] [address]  [place], [place], [address] Phone: [phone]   Fax:  [phone]  Physical Therapy Treatment  Patient Details  Name: Laura [name] MRN: [id number] Date of Birth: [birth year] Referring Provider:  Marchia Bond, MD  Encounter Date: 03/24/2015      PT End of Session - 03/24/15 1045    Visit Number 6   Number of Visits 16   Date for PT Re-Evaluation 05/03/15   PT Start Time 4401   PT Stop Time 1052   PT Time Calculation (min) 38 min   Activity Tolerance Patient tolerated treatment well   Behavior During Therapy [facility] for tasks assessed/performed      Past Medical History  Diagnosis Date  . Chicken pox as a child  . Measles as a child  . Mumps as a child  . Hypertension 20 yrs ago  . 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 Cataract   . 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 Hyperlipidemia   . Overweight(278.02)   . GERD (gastroesophageal reflux disease)   . Other and unspecified hyperlipidemia 10/13/2013  . Fibrocystic breast 05/01/2014  . Medicare annual wellness visit, subsequent 02/01/2015    Past Surgical History  Procedure Laterality Date  . Vaginal hysterectomy  74 yrs old    uterus removed  . Eye surgery  11-27-2010    cataract surgery in both eyes  . 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 Abdominal hysterectomy  29 yrs ago    heavy bleeding, ovaries left in place    There were no vitals filed for this visit.  Visit Diagnosis:  Lateral pain of left hip  Stiffness of left hip joint  Weakness of left hip      Subjective Assessment - 03/24/15 1018    Subjective Reports able to get up from sitting and start walking without pain. States overall she feels like she is 85% better.   Currently in Pain? No/denies            [facility] PT Assessment - 03/24/15 0001    Observation/Other Assessments   Observations 73% (27% limitation)    ROM / Strength   AROM / PROM / Strength Strength   Strength   Strength Assessment Site Hip;Knee   Right/Left Hip Left   Left Hip Flexion 4/5   Left Hip Extension 4-/5   Left Hip ABduction 4/5   Left Hip ADduction 4/5   Right/Left Knee Left   Left Knee Flexion 4/5   Left Knee Extension 4/5                 [facility] Adult PT Treatment/Exercise - 03/24/15 1015    Exercises   Exercises Knee/Hip   Knee/Hip Exercises: Aerobic   Recumbent Bike lvl 1 x 5'   Knee/Hip Exercises: Supine   Hip Adduction Isometric Strengthening;10 reps   Hip Adduction Isometric Limitations Hooklying with Green TB   Bridges with [employer] Strengthening;Both;10 reps  3" hold   Straight Leg Raises Both;10 reps   Straight Leg Raises Limitations 3" hold   Other Supine Knee/Hip Exercises Hooklying DL & SL hip Abduction clams with Green TB 10x3"                PT Education - 03/24/15 1047    Education Details Strengthening HEP (refer to Pt Instructions)   Person(s) Educated Patient  Methods Explanation;Demonstration   Comprehension Verbalized understanding;Returned demonstration          PT Short Term Goals - 04-04-15 1048    PT SHORT TERM GOAL #1   Title Independent with initial HEP (04/05/15)   Status Achieved           PT Long Term Goals - 04-04-2015 1048    PT LONG TERM GOAL #1   Title Independent with advanced HEP (05/03/15)   Status On-going   PT LONG TERM GOAL #2   Title Patient will report hip/lateral thigh pain no greater than 4/10 (05/03/15)   Status Achieved   PT LONG TERM GOAL #3   Title Patient will be able to climb stairs reciprocally without increased pain (05/02/14)   Status Achieved   PT LONG TERM GOAL #4   Title Bilateral hip and knee strength >/= 4+/5 without pain (05/03/15)   Status On-going               Plan - Apr 04, 2015 1153    Clinical Impression Statement Patient reporting 85% improved with pain only on rare occasions at present and not  significantly impacting her daily functioning. Able to stand and walk from sitting and climb stairs without pain interference. Patient continues to express desire to be finished with therapy. Assessed progress toward goals and discussed status with patient. Goals met related to compliance with initial HEP, pain reports, and impact of pain on functional mobility. Strength improved but goal not yet met. Educated patient on importance of strengthening to prevent reccurance of pain and provided updated HEP focused on strengthening exercises. Patient encouraged to continue to complete stretching as needed to prevent muscles from becoming tight again. Patient and PT agreed to have patient to continue on own with HEP and will keep chart open for 30 days, with patient to call for an appt to return if problems or questions arise. If no further therapy need after 30 days, will proceed with discharge.   PT Next Visit Plan Continue with HEP; Patient to call for appt within 30 days if needs to return   PT Home Exercise Plan HEP updated to include home strengthening exercises   Consulted and Agree with Plan of Care Patient          G-Codes - 04-04-2015 10-08-1205    Functional Assessment Tool Used FOTO = 73% (27% limitation)   Functional Limitation Mobility: Walking and moving around   Mobility: Walking and Moving Around Current Status [phone]) At least 20 percent but less than 40 percent impaired, limited or restricted   Mobility: Walking and Moving Around Goal Status [phone]) At least 40 percent but less than 60 percent impaired, limited or restricted   Mobility: Walking and Moving Around Discharge Status [phone]) At least 20 percent but less than 40 percent impaired, limited or restricted      Problem List Patient Active Problem List   Diagnosis Date Noted  . Medicare annual wellness visit, subsequent 02/01/2015  . Muscle strain 08/11/2014  . Hyperlipidemia 07/18/2014  . Fibrocystic breast 05/01/2014  .  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 Decreased visual acuity 01/23/2014  . HTN (hypertension) 01/23/2014  . Sun-damaged skin 01/23/2014  . Hot flashes 01/23/2014  . Recurrent BCC (basal cell carcinoma) 10/13/2013  . Vitamin D deficiency 10/13/2013  . Pain in joint, ankle and foot 10/13/2013  . Cancer   . Cataract   . EBV infection   . Overweight     Percival Spanish, PT, MPT April 04, 2015, 12:12 PM  [facility] [address]  [place], [place], [address] Phone: [phone]   Fax:  [phone]     PHYSICAL THERAPY DISCHARGE SUMMARY  Visits from Start of Care: 6  Current functional level related to goals / functional outcomes:  As of last PT visit on 03/24/15, patient reporting 85% improved with pain only on rare occasions at present and not significantly impacting her daily functioning. Able to stand and walk from sitting and climb stairs without pain interference. Goals met related to compliance with initial HEP, pain reports, and impact of pain on functional mobility. Strength improved but goal not yet met. At the time, patient expressing desire to be finished with therapy, therefore patient and PT agreed to have patient to continue on own with HEP and kept chart open for 30 days, with patient to call for an appt to return if problems or questions arise. Educated patient on importance of of continuing strengthening to prevent reccurance of pain and provided updated HEP focused on strengthening exercises. Patient encouraged to continue to complete stretching as needed to prevent muscles from becoming tight again. Patient now >30 days out without need to return to therapy, therefore will proceed with D/C.   Remaining deficits:  None known   Education / Equipment:  HEP Plan: Patient agrees to discharge.  Patient goals were partially met. Patient is being discharged due to being pleased with the current functional level.  ?????        Percival Spanish, PT, MPT 04/26/2015, 10:52 AM  [facility] [place], [place], [address] Phone: [phone]   Fax:  [phone]

## 2015-03-27 DIAGNOSIS — L820 Inflamed seborrheic keratosis: Secondary | ICD-10-CM | POA: Diagnosis not present

## 2015-03-27 DIAGNOSIS — L239 Allergic contact dermatitis, unspecified cause: Secondary | ICD-10-CM | POA: Diagnosis not present

## 2015-03-28 ENCOUNTER — Ambulatory Visit: Payer: Medicare Other | Admitting: Physical Therapy

## 2015-03-30 ENCOUNTER — Ambulatory Visit: Payer: Medicare Other | Admitting: Physical Therapy

## 2015-03-31 ENCOUNTER — Ambulatory Visit: Payer: Medicare Other | Admitting: Rehabilitation

## 2015-04-04 ENCOUNTER — Ambulatory Visit: Payer: Medicare Other | Admitting: Rehabilitation

## 2015-04-07 ENCOUNTER — Ambulatory Visit: Payer: Medicare Other | Admitting: Physical Therapy

## 2015-04-11 ENCOUNTER — Ambulatory Visit: Payer: Medicare Other | Admitting: Physical Therapy

## 2015-04-12 DIAGNOSIS — M1712 Unilateral primary osteoarthritis, left knee: Secondary | ICD-10-CM | POA: Diagnosis not present

## 2015-04-14 ENCOUNTER — Ambulatory Visit: Payer: Medicare Other | Admitting: Rehabilitation

## 2015-05-01 ENCOUNTER — Other Ambulatory Visit: Payer: Medicare Other

## 2015-05-01 ENCOUNTER — Ambulatory Visit: Payer: Medicare Other

## 2015-05-15 ENCOUNTER — Ambulatory Visit
Admission: RE | Admit: 2015-05-15 | Discharge: 2015-05-15 | Disposition: A | Discharge status: Home / Self Care | Payer: Medicare Other | Source: Ambulatory Visit | Attending: Family Medicine | Admitting: Family Medicine

## 2015-05-15 DIAGNOSIS — M8588 Other specified disorders of bone density and structure, other site: Secondary | ICD-10-CM | POA: Diagnosis not present

## 2015-05-15 DIAGNOSIS — Z780 Asymptomatic menopausal state: Secondary | ICD-10-CM

## 2015-05-15 DIAGNOSIS — Z1231 Encounter for screening mammogram for malignant neoplasm of breast: Secondary | ICD-10-CM

## 2015-05-15 DIAGNOSIS — M8580 Other specified disorders of bone density and structure, unspecified site: Secondary | ICD-10-CM | POA: Diagnosis not present

## 2015-07-10 ENCOUNTER — Telehealth: Payer: Self-pay | Admitting: Gastroenterology

## 2015-07-10 ENCOUNTER — Encounter: Payer: Self-pay | Admitting: Family Medicine

## 2015-07-10 ENCOUNTER — Encounter: Payer: Self-pay | Admitting: Gastroenterology

## 2015-07-10 ENCOUNTER — Ambulatory Visit (INDEPENDENT_AMBULATORY_CARE_PROVIDER_SITE_OTHER): Payer: Medicare Other | Admitting: Family Medicine

## 2015-07-10 VITALS — BP 138/78 | HR 88 | Temp 98.3°F | Ht 64.0 in | Wt 168.0 lb

## 2015-07-10 DIAGNOSIS — I10 Essential (primary) hypertension: Secondary | ICD-10-CM | POA: Diagnosis not present

## 2015-07-10 DIAGNOSIS — E663 Overweight: Secondary | ICD-10-CM

## 2015-07-10 DIAGNOSIS — J029 Acute pharyngitis, unspecified: Secondary | ICD-10-CM

## 2015-07-10 DIAGNOSIS — E559 Vitamin D deficiency, unspecified: Secondary | ICD-10-CM

## 2015-07-10 DIAGNOSIS — Z86010 Personal history of colonic polyps: Secondary | ICD-10-CM

## 2015-07-10 DIAGNOSIS — Z23 Encounter for immunization: Secondary | ICD-10-CM

## 2015-07-10 DIAGNOSIS — R55 Syncope and collapse: Secondary | ICD-10-CM

## 2015-07-10 MED ORDER — AMOXICILLIN 500 MG PO CAPS: 500.0000 mg | ORAL_CAPSULE | Freq: Three times a day (TID) | ORAL | Status: DC

## 2015-07-10 NOTE — Progress Notes (Signed)
Pre visit review using our clinic review tool, if applicable. No additional management support is needed unless otherwise documented below in the visit note. 

## 2015-07-10 NOTE — Telephone Encounter (Signed)
Dr. Fuller Plan reviewed records and has accepted patient. Ok to schedule direct colon. Colonoscopy scheduled with Dr. Fuller Plan.

## 2015-07-10 NOTE — Progress Notes (Signed)
Laura [name] [id number] June 29, 1941 07/10/2015      Patient Progress Note   Subjective  Chief Complaint  Chief Complaint  Patient presents with  . Follow-up    HPI  Patient is a 74 y.o. female in today for routine medical care. Patient is in today for annual exam. Has had a sore throat for the last week. Right ear is also bothering her. It was painful one night. Denies rhinorrhea, congestion, fevers, chills, productive cough. Hot flashes have subsided. 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 Chicken pox as a child  . Measles as a child  . Mumps as a child  . Hypertension 20 yrs ago  . Cancer ([place])     basal on back and nose  . Cataract   . 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 Hyperlipidemia   . Overweight(278.02)   . GERD (gastroesophageal reflux disease)   . Other and unspecified hyperlipidemia 10/13/2013  . Fibrocystic breast 05/01/2014  . Medicare annual wellness visit, subsequent 02/01/2015    Past Surgical History  Procedure Laterality Date  . Vaginal hysterectomy  74 yrs old    uterus removed  . Eye surgery  11-27-2010    cataract surgery in both eyes  . 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 Abdominal hysterectomy  29 yrs ago    heavy bleeding, ovaries left in place    Family History  Problem Relation Age of Onset  . Heart disease Father   . Heart attack Father 41  . Hypertension Father   . Dementia Maternal Grandmother   . Heart disease Maternal Grandfather   . Hyperlipidemia Paternal Grandmother   . Cancer Paternal Grandfather     stomach  . Heart attack Paternal Grandfather   . Dementia Mother   . Pulmonary embolism Son     Social History   Social History  . Marital Status: Married    Spouse Name: N/A  .  Number of Children: N/A  . Years of Education: N/A   Occupational History  . Not on file.   Social History Main Topics  . Smoking status: Former Smoker -- 1.00 packs/day for 16 years    Types: Cigarettes    Start date: 07/29/1972  . Smokeless tobacco: Never Used  . Alcohol Use: Yes     Comment: special occasions gllass of wine  . Drug Use: No  . Sexual Activity: Yes     Comment: lives with husband no major dietary restrictions   Other Topics Concern  . Not on file   Social History Narrative    Current Outpatient Prescriptions on File Prior to Visit  Medication Sig Dispense Refill  . Fiber, Guar Gum, CHEW Chew by mouth as needed.    Marland Kitchen losartan-hydrochlorothiazide (HYZAAR) 50-12.5 MG per tablet Take 1 tablet by mouth daily. 90 tablet 2  . Multiple Vitamins-Minerals (MULTI-VITAMIN GUMMIES) CHEW Chew 1 tablet by mouth daily.     No current facility-administered medications on file prior to visit.    Allergies  Allergen Reactions  . Lisinopril     dizzy  . Statins     Muscle soreness   . Sulfa Antibiotics Other (See Comments)    "feels feverish"  . Tetracyclines & Related     GI  upset    Review of Systems   Constitutional: Negative for fever and malaise/fatigue.  HENT: Negative for congestion. Positive for right ear and throat pain Eyes: Negative for discharge.  Respiratory: Negative for shortness of breath.  Cardiovascular: Negative for chest pain, palpitations and leg swelling.  Gastrointestinal: Negative for nausea, abdominal pain and diarrhea.  Genitourinary: Negative for dysuria and urgency, hematuria and flank pain.  Musculoskeletal: Negative for myalgias and falls.  Skin: Negative for rash.  Neurological: Positivie for loss of consciousness and headaches.  Endo/Heme/Allergies: Negative for polydipsia.  Psychiatric/Behavioral: Negative for depression and suicidal ideas. The patient is not nervous/anxious and does not have insomnia.   Objective  BP  142/82 mmHg  Pulse 88  Temp(Src) 98.3 F (36.8 C) (Oral)  Ht 5\' 4"  (1.626 m)  Wt 168 lb (76.204 kg)  BMI 28.82 kg/m2  SpO2 96%  Physical Exam   Constitutional: Oriented to person, place, and time. Appears well-nourished. No distress.  Eyes: EOM are normal. Pupils are equal, round, and reactive to light.  Cardiovascular: Normal rate and regular rhythm.  Pulmonary/Chest: Breath sounds normal.  Abdominal: Soft. Bowel sounds are normal.  Lymphadenopathy:   No cervical adenopathy.  Neurological: Alert and oriented to person, place, and time. Normal reflexes. No cranial nerve deficit.    Assessment & Plan  Physical Exam -Patient encouraged to maintain heart healthy diet, regular exercise, adequate sleep. -Consider daily probiotics. Take medications as prescribed. Labs ordered and reviewed  Essential hypertension -Well controlled, no changes to meds. -Encouraged heart healthy diet and exercise as tolerated.   Hyperlipidemia -Tolerating statin -Encouraged heart healthy diet, avoid trans fats, minimize simple carbs and saturated fats.  -Increase exercise as tolerated  GERD -Avoid offending foods, start probiotics.  -Do not eat large meals in late evening and consider raising head of bed.  -Continue current medications  Preventative -Flu Vaccine  Ear Pain -Normal physical exam -Recommend mucinex and return if symptoms worsen  Near Syncope -Most likely vasovagal in nature -Monitor and change positions slowly   Patient seen with and examined with student.  Agree with documentation See separate note for further documentation

## 2015-07-10 NOTE — Patient Instructions (Signed)
Encouraged increased rest and hydration, add probiotics, zinc such as Coldeze or Xicam. Treat fevers as needed, Vitamin C 500 to 1000 mg, Mucinex twice daily x 7 days. Elderberry liquid  Syncope, commonly known as fainting, is a temporary loss of consciousness. It occurs when the blood flow to the brain is reduced. Vasovagal syncope (also called neurocardiogenic syncope) is a fainting spell in which the blood flow to the brain is reduced because of a sudden drop in heart rate and blood pressure. Vasovagal syncope occurs when the brain and the cardiovascular system (blood vessels) do not adequately communicate and respond to each other. This is the most common cause of fainting. It often occurs in response to fear or some other type of emotional or physical stress. The body has a reaction in which the heart starts beating too slowly or the blood vessels expand, reducing blood pressure. This type of fainting spell is generally considered harmless. However, injuries can occur if a person takes a sudden fall during a fainting spell.  CAUSES  Vasovagal syncope occurs when a person's blood pressure and heart rate decrease suddenly, usually in response to a trigger. Many things and situations can trigger an episode. Some of these include:   Pain.   Fear.   The sight of blood or medical procedures, such as blood being drawn from a vein.   Common activities, such as coughing, swallowing, stretching, or going to the bathroom.   Emotional stress.   Prolonged standing, especially in a warm environment.   Lack of sleep or rest.   Prolonged lack of food.   Prolonged lack of fluids.   Recent illness.  The use of certain drugs that affect blood pressure, such as cocaine, alcohol, marijuana, inhalants, and opiates.  SYMPTOMS  Before the fainting episode, you may:   Feel dizzy or light headed.   Become pale.  Sense that you are going to faint.   Feel like the room is spinning.    Have tunnel vision, only seeing directly in front of you.   Feel sick to your stomach (nauseous).   See spots or slowly lose vision.   Hear ringing in your ears.   Have a headache.   Feel warm and sweaty.   Feel a sensation of pins and needles. During the fainting spell, you will generally be unconscious for no longer than a couple minutes before waking up and returning to normal. If you get up too quickly before your body can recover, you may faint again. Some twitching or jerky movements may occur during the fainting spell.  DIAGNOSIS  Your health care provider will ask about your symptoms, take a medical history, and perform a physical exam. Various tests may be done to rule out other causes of fainting. These may include blood tests and tests to check the heart, such as electrocardiography, echocardiography, and possibly an electrophysiology study. When other causes have been ruled out, a test may be done to check the body's response to changes in position (tilt table test). TREATMENT  Most cases of vasovagal syncope do not require treatment. Your health care provider may recommend ways to avoid fainting triggers and may provide home strategies for preventing fainting. If you must be exposed to a possible trigger, you can drink additional fluids to help reduce your chances of having an episode of vasovagal syncope. If you have warning signs of an oncoming episode, you can respond by positioning yourself favorably (lying down). If your fainting spells continue, you may be  given medicines to prevent fainting. Some medicines may help make you more resistant to repeated episodes of vasovagal syncope. Special exercises or compression stockings may be recommended. In rare cases, the surgical placement of a pacemaker is considered. HOME CARE INSTRUCTIONS   Learn to identify the warning signs of vasovagal syncope.   Sit or lie down at the first warning sign of a fainting spell. If  sitting, put your head down between your legs. If you lie down, swing your legs up in the air to increase blood flow to the brain.   Avoid hot tubs and saunas.  Avoid prolonged standing.  Drink enough fluids to keep your urine clear or pale yellow. Avoid caffeine.  Increase salt in your diet as directed by your health care provider.   If you have to stand for a long time, perform movements such as:   Crossing your legs.   Flexing and stretching your leg muscles.   Squatting.   Moving your legs.   Bending over.   Only take over-the-counter or prescription medicines as directed by your health care provider. Do not suddenly stop any medicines without asking your health care provider first. [place] IF:   Your fainting spells continue or happen more frequently in spite of treatment.   You lose consciousness for more than a couple minutes.  You have fainting spells during or after exercising or after being startled.   You have new symptoms that occur with the fainting spells, such as:   Shortness of breath.  Chest pain.   Irregular heartbeat.   You have episodes of twitching or jerky movements that last longer than a few seconds.  You have episodes of twitching or jerky movements without obvious fainting. SEEK IMMEDIATE MEDICAL CARE IF:   You have injuries or bleeding after a fainting spell.   You have episodes of twitching or jerky movements that last longer than 5 minutes.   You have more than one spell of twitching or jerky movements before returning to consciousness after fainting.   This information is not intended to replace advice given to you by your health care provider. Make sure you discuss any questions you have with your health care provider.   Document Released: 07/01/2012 Document Revised: 11/29/2014 Document Reviewed: 07/01/2012 Elsevier Interactive Patient Education [employer].

## 2015-07-15 ENCOUNTER — Encounter: Payer: Self-pay | Admitting: Family Medicine

## 2015-07-15 DIAGNOSIS — J029 Acute pharyngitis, unspecified: Secondary | ICD-10-CM | POA: Insufficient documentation

## 2015-07-15 DIAGNOSIS — R55 Syncope and collapse: Secondary | ICD-10-CM

## 2015-07-15 DIAGNOSIS — Z86010 Personal history of colonic polyps: Secondary | ICD-10-CM | POA: Insufficient documentation

## 2015-07-15 HISTORY — DX: Syncope and collapse: R55

## 2015-07-15 NOTE — Assessment & Plan Note (Signed)
Encouraged DASH diet, decrease po intake and increase exercise as tolerated. Needs 7-8 hours of sleep nightly. Avoid trans fats, eat small, frequent meals every 4-5 hours with lean proteins, complex carbs and healthy fats. Minimize simple carbs 

## 2015-07-15 NOTE — Assessment & Plan Note (Addendum)
Well controlled, no changes to meds. Encouraged heart healthy diet such as the DASH diet and exercise as tolerated. Given flu shot 

## 2015-07-15 NOTE — Progress Notes (Signed)
Subjective:    Patient ID: Laura [name], female    DOB: [birth year], 74 y.o.   MRN: [id number]  Chief Complaint  Patient presents with  . Follow-up    HPI Patient is in today for follow-up and unfortunately not feeling great today. She has not been feeling well for about a week. She has noted a sore throat most notably on the right-hand side. She is also noted right ear pain. Denies tinnitus or hearing loss. Denies fevers or chills. Has a chronic runny nose but it is not changed or increased. She does note some postnasal drip productive of cough. She's had some low-grade constipation which is responded nicely to stool softeners twice a day. No other recent illness or acute concerns. Denies CP/palp/SOB/fevers/GI or GU c/o. Taking meds as prescribed  Past Medical History  Diagnosis Date  . Chicken pox as a child  . Measles as a child  . Mumps as a child  . Hypertension 20 yrs ago  . 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 Cataract   . 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 Hyperlipidemia   . Overweight(278.02)   . GERD (gastroesophageal reflux disease)   . Other and unspecified hyperlipidemia 10/13/2013  . Fibrocystic breast 05/01/2014  . Medicare annual wellness visit, subsequent 02/01/2015  . Vasovagal reaction 07/15/2015    Past Surgical History  Procedure Laterality Date  . Vaginal hysterectomy  74 yrs old    uterus removed  . Eye surgery  11-27-2010    cataract surgery in both eyes  . 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 Abdominal hysterectomy  30 yrs ago    heavy bleeding, ovaries left in place    Family History  Problem Relation Age of Onset  . Heart disease Father   . Heart attack Father 31  . Hypertension Father   . Dementia Maternal Grandmother   . Heart disease Maternal Grandfather   . Hyperlipidemia Paternal Grandmother   . Cancer Paternal Grandfather     stomach  . Heart attack Paternal Grandfather   .  Dementia Mother   . Pulmonary embolism Son     Social History   Social History  . Marital Status: Married    Spouse Name: N/A  . Number of Children: N/A  . Years of Education: N/A   Occupational History  . Not on file.   Social History Main Topics  . Smoking status: Former Smoker -- 1.00 packs/day for 16 years    Types: Cigarettes    Start date: 07/29/1972  . Smokeless tobacco: Never Used  . Alcohol Use: Yes     Comment: special occasions gllass of wine  . Drug Use: No  . Sexual Activity: Yes     Comment: lives with husband no major dietary restrictions   Other Topics Concern  . Not on file   Social History Narrative    Outpatient Prescriptions Prior to Visit  Medication Sig Dispense Refill  . Fiber, Guar Gum, CHEW Chew by mouth as needed.    Marland Kitchen losartan-hydrochlorothiazide (HYZAAR) 50-12.5 MG per tablet Take 1 tablet by mouth daily. 90 tablet 2  . Multiple Vitamins-Minerals (MULTI-VITAMIN GUMMIES) CHEW Chew 1 tablet by mouth daily.     No facility-administered medications prior to visit.    Allergies  Allergen Reactions  . Lisinopril     dizzy  . Statins     Muscle soreness   .  Sulfa Antibiotics Other (See Comments)    "feels feverish"  . Tetracyclines & Related     GI upset    Review of Systems  Constitutional: Positive for malaise/fatigue. Negative for fever.  HENT: Positive for congestion, ear pain and sore throat. Negative for ear discharge and tinnitus.   Eyes: Negative for discharge.  Respiratory: Negative for shortness of breath.   Cardiovascular: Negative for chest pain, palpitations and leg swelling.  Gastrointestinal: Negative for nausea and abdominal pain.  Genitourinary: Negative for dysuria.  Musculoskeletal: Negative for falls.  Skin: Negative for rash.  Neurological: Positive for headaches. Negative for loss of consciousness.  Endo/Heme/Allergies: Negative for environmental allergies.  Psychiatric/Behavioral: Negative for depression.  The patient is not nervous/anxious.        Objective:    Physical Exam  Constitutional: She is oriented to person, place, and time. She appears well-developed and well-nourished. No distress.  HENT:  Head: Normocephalic and atraumatic.  Nose: Nose normal.  Throat and TMs midly erythematous  Eyes: Right eye exhibits no discharge. Left eye exhibits no discharge.  Neck: Normal range of motion. Neck supple.  Cardiovascular: Normal rate and regular rhythm.   No murmur heard. Pulmonary/Chest: Effort normal and breath sounds normal.  Abdominal: Soft. Bowel sounds are normal. There is no tenderness.  Musculoskeletal: She exhibits no edema.  Neurological: She is alert and oriented to person, place, and time.  Skin: Skin is warm and dry.  Psychiatric: She has a normal mood and affect.  Nursing note and vitals reviewed.   BP 138/78 mmHg  Pulse 88  Temp(Src) 98.3 F (36.8 C) (Oral)  Ht 5\' 4"  (1.626 m)  Wt 168 lb (76.204 kg)  BMI 28.82 kg/m2  SpO2 96% Wt Readings from Last 3 Encounters:  07/10/15 168 lb (76.204 kg)  01/17/15 167 lb (75.751 kg)  07/14/14 157 lb (71.215 kg)     Lab Results  Component Value Date   WBC 6.2 01/17/2015   HGB 13.8 01/17/2015   HCT 40.7 01/17/2015   PLT 273.0 01/17/2015   GLUCOSE 83 01/17/2015   CHOL 227* 01/17/2015   TRIG 150.0* 01/17/2015   HDL 51.30 01/17/2015   LDLCALC 146* 01/17/2015   ALT 12 01/17/2015   AST 16 01/17/2015   NA 140 01/17/2015   K 3.8 01/17/2015   CL 104 01/17/2015   CREATININE 0.90 01/17/2015   BUN 23 01/17/2015   CO2 29 01/17/2015   TSH 2.73 01/17/2015    Lab Results  Component Value Date   TSH 2.73 01/17/2015   Lab Results  Component Value Date   WBC 6.2 01/17/2015   HGB 13.8 01/17/2015   HCT 40.7 01/17/2015   MCV 86.5 01/17/2015   PLT 273.0 01/17/2015   Lab Results  Component Value Date   NA 140 01/17/2015   K 3.8 01/17/2015   CO2 29 01/17/2015   GLUCOSE 83 01/17/2015   BUN 23 01/17/2015    CREATININE 0.90 01/17/2015   BILITOT 0.4 01/17/2015   ALKPHOS 54 01/17/2015   AST 16 01/17/2015   ALT 12 01/17/2015   PROT 7.2 01/17/2015   ALBUMIN 4.2 01/17/2015   CALCIUM 10.1 01/17/2015   GFR 65.29 01/17/2015   Lab Results  Component Value Date   CHOL 227* 01/17/2015   Lab Results  Component Value Date   HDL 51.30 01/17/2015   Lab Results  Component Value Date   LDLCALC 146* 01/17/2015   Lab Results  Component Value Date   TRIG 150.0* 01/17/2015  Lab Results  Component Value Date   CHOLHDL 4 01/17/2015   No results found for: HGBA1C     Assessment & Plan:   Problem List Items Addressed This Visit    Acute pharyngitis - Primary   Relevant Medications   amoxicillin (AMOXIL) 500 MG capsule   History of colonic polyps   Relevant Orders   Ambulatory referral to Gastroenterology   HTN (hypertension)    Well controlled, no changes to meds. Encouraged heart healthy diet such as the DASH diet and exercise as tolerated. Given flu shot.      Overweight    Encouraged DASH diet, decrease po intake and increase exercise as tolerated. Needs 7-8 hours of sleep nightly. Avoid trans fats, eat small, frequent meals every 4-5 hours with lean proteins, complex carbs and healthy fats. Minimize simple carbs      Vasovagal reaction    No syncope but very lightheaded after a bowel movement. Has not recurred. Encouraged to report if symptoms worsen and to maintain adequate hydration      Vitamin D deficiency    Encouraged daily supplements.        Other Visit Diagnoses    Encounter for immunization        Given flu shot today       I am having Ms. Frentz start on amoxicillin. I am also having her maintain her Fiber (Guar Gum), MULTI-VITAMIN GUMMIES, losartan-hydrochlorothiazide, and clindamycin.  Meds ordered this encounter  Medications  . clindamycin (CLEOCIN T) 1 % lotion    Sig: Apply 30 application topically as needed.    Refill:  0  . amoxicillin (AMOXIL) 500  MG capsule    Sig: Take 1 capsule (500 mg total) by mouth 3 (three) times daily.    Dispense:  30 capsule    Refill:  0     Penni Homans, MD

## 2015-07-15 NOTE — Assessment & Plan Note (Signed)
No syncope but very lightheaded after a bowel movement. Has not recurred. Encouraged to report if symptoms worsen and to maintain adequate hydration

## 2015-07-15 NOTE — Assessment & Plan Note (Signed)
Encouraged daily supplements 

## 2015-09-05 ENCOUNTER — Ambulatory Visit (AMBULATORY_SURGERY_CENTER): Payer: Self-pay | Admitting: *Deleted

## 2015-09-05 VITALS — Ht 64.0 in | Wt 173.0 lb

## 2015-09-05 DIAGNOSIS — Z86010 Personal history of colonic polyps: Secondary | ICD-10-CM

## 2015-09-05 MED ORDER — NA SULFATE-K SULFATE-MG SULF 17.5-3.13-1.6 GM/177ML PO SOLN: 1.0000 | Freq: Once | ORAL | Status: DC

## 2015-09-05 NOTE — Progress Notes (Signed)
No egg or soy allergy known to patient  No issues with past sedation with any surgeries  or procedures, no intubation problems  No diet pills per patient No home 02 use per patient  No blood thinners per patient    

## 2015-09-19 ENCOUNTER — Encounter: Payer: Self-pay | Admitting: Gastroenterology

## 2015-09-19 ENCOUNTER — Ambulatory Visit (AMBULATORY_SURGERY_CENTER): Payer: Medicare Other | Admitting: Gastroenterology

## 2015-09-19 VITALS — BP 137/74 | HR 59 | Temp 97.1°F | Resp 14 | Ht 64.0 in | Wt 173.0 lb

## 2015-09-19 DIAGNOSIS — Z86010 Personal history of colonic polyps: Secondary | ICD-10-CM | POA: Diagnosis not present

## 2015-09-19 DIAGNOSIS — E663 Overweight: Secondary | ICD-10-CM | POA: Diagnosis not present

## 2015-09-19 DIAGNOSIS — I10 Essential (primary) hypertension: Secondary | ICD-10-CM | POA: Diagnosis not present

## 2015-09-19 MED ORDER — SODIUM CHLORIDE 0.9 % IV SOLN: 500.0000 mL | INTRAVENOUS | Status: DC

## 2015-09-19 NOTE — Op Note (Signed)
[facility] [place] [facility]  [address]. [place], [id number]   COLONOSCOPY PROCEDURE REPORT  PATIENT: Laura [name], Laura [name]  MR#: [id number] BIRTHDATE: 1941/05/29 , 74  yrs. old GENDER: female ENDOSCOPIST: Ladene Artist, MD, Mar[facility] Regal REFERRED BY:  Penni Homans, M.D. PROCEDURE DATE:  09/19/2015 PROCEDURE:   Colonoscopy, surveillance First Screening Colonoscopy - Avg.  risk and is 50 yrs.  old or older - No.  Prior Negative Screening - Now for repeat screening. N/A  History of Adenoma - Now for follow-up colonoscopy & has been > or = to 3 yrs.  Yes hx of adenoma.  Has been 3 or more years since last colonoscopy.  Polyps removed today? No Recommend repeat exam, <10 yrs? 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 MEDICATIONS: Monitored anesthesia care, Propofol 250 mg IV, and lidocaine 40 mg IV  DESCRIPTION OF PROCEDURE:   After the risks benefits and alternatives of the procedure were thoroughly explained, informed consent was obtained.  The digital rectal exam revealed no abnormalities of the rectum.   The LB [id number]  endoscope was introduced through the anus and advanced to the cecum, which was identified by both the appendix and ileocecal valve. No adverse events experienced.   The quality of the prep was excellent. (Suprep was used)  The instrument was then slowly withdrawn as the colon was fully examined. Estimated blood loss is zero unless otherwise noted in this procedure report.   COLON FINDINGS: A normal appearing cecum, ileocecal valve, and appendiceal orifice were identified.  The ascending, transverse, descending, sigmoid colon, and rectum appeared unremarkable. Retroflexed views revealed internal Grade I hemorrhoids. The time to cecum = 4.1 Withdrawal time = 8.3   The scope was withdrawn and the procedure completed. COMPLICATIONS: There were  no immediate complications.  ENDOSCOPIC IMPRESSION: 1.  Normal colonoscopy 2.  Grade I internal hemorrhoids  RECOMMENDATIONS: 1.  Repeat Colonoscopy in 5 years.  eSigned:  Ladene Artist, MD, [facility] 09/19/2015 9:35 AM

## 2015-09-19 NOTE — Progress Notes (Addendum)
No egg or soy allergy known to patient  No issues with past sedation with any surgeries  or procedures, no intubation problems  No diet pills per patient No home 02 use per patient  No blood thinners per patient  Pt states she took suprep yesterday 1st bottle at 3 pm and second  bottle about 2030. She decided to take the second bottle yesterday instead of this am because she was going  so much she thought she would complete and be able to sleep, although she has not slept from going so much   Pt states her stool is clear yellow like urine, denies cloudy brown or solid stool

## 2015-09-19 NOTE — Progress Notes (Signed)
Pt extremely nervous pre- procedure  To RR stable

## 2015-09-19 NOTE — Patient Instructions (Addendum)
YOU HAD AN ENDOSCOPIC PROCEDURE TODAY AT [facility]:   Refer to the procedure report that was given to you for any specific questions about what was found during the examination.  If the procedure report does not answer your questions, please call your gastroenterologist to clarify.  If you requested that your care partner not be given the details of your procedure findings, then the procedure report has been included in a sealed envelope for you to review at your convenience later.  YOU SHOULD EXPECT: Some feelings of bloating in the abdomen. Passage of more gas than usual.  Walking can help get rid of the air that was put into your GI tract during the procedure and reduce the bloating. If you had a lower endoscopy (such as a colonoscopy or flexible sigmoidoscopy) you may notice spotting of blood in your stool or on the toilet paper. If you underwent a bowel prep for your procedure, you may not have a normal bowel movement for a few days.  Please Note:  You might notice some irritation and congestion in your nose or some drainage.  This is from the oxygen used during your procedure.  There is no need for concern and it should clear up in a day or so.  SYMPTOMS TO REPORT IMMEDIATELY:   Following lower endoscopy (colonoscopy or flexible sigmoidoscopy):  Excessive amounts of blood in the stool  Significant tenderness or worsening of abdominal pains  Swelling of the abdomen that is new, acute  Fever of 100F or higher   For urgent or emergent issues, a gastroenterologist can be reached at any hour by calling [phone].   DIET: Your first meal following the procedure should be a small meal and then it is ok to progress to your normal diet. Heavy or fried foods are harder to digest and may make you feel nauseous or bloated.  Likewise, meals heavy in dairy and vegetables can increase bloating.  Drink plenty of fluids but you should avoid alcoholic beverages for 24  hours.  ACTIVITY:  You should plan to take it easy for the rest of today and you should NOT DRIVE or use heavy machinery until tomorrow (because of the sedation medicines used during the test).    FOLLOW UP: Our staff will call the number listed on your records the next business day following your procedure to check on you and address any questions or concerns that you may have regarding the information given to you following your procedure. If we do not reach you, we will leave a message.  However, if you are feeling well and you are not experiencing any problems, there is no need to return our call.  We will assume that you have returned to your regular daily activities without incident.  If any biopsies were taken you will be contacted by phone or by letter within the next 1-3 weeks.  Please call us at [phone] if you have not heard about the biopsies in 3 weeks.    SIGNATURES/CONFIDENTIALITY: You and/or your care partner have signed paperwork which will be entered into your electronic medical record.  These signatures attest to the fact that that the information above on your After Visit Summary has been reviewed and is understood.  Full responsibility of the confidentiality of this discharge information lies with you and/or your care-partner.  Normal screening Repeat Colonoscopy in 5 years

## 2015-09-20 ENCOUNTER — Telehealth: Payer: Self-pay | Admitting: *Deleted

## 2015-09-20 NOTE — Telephone Encounter (Signed)
  Follow up Call-  Call back number 09/19/2015  Post procedure Call Back phone  # [phone]  Permission to leave phone message Yes     Patient questions:  Do you have a fever, pain , or abdominal swelling? No. Pain Score  0 *  Have you tolerated food without any problems? Yes.    Have you been able to return to your normal activities? Yes.    Do you have any questions about your discharge instructions: Diet   No. Medications  No. Follow up visit  No.  Do you have questions or concerns about your Care? No.  Actions: * If pain score is 4 or above: No action needed, pain <4.  Stated staff did excellent care.

## 2015-09-29 ENCOUNTER — Encounter: Payer: Self-pay | Admitting: Family Medicine

## 2015-09-29 ENCOUNTER — Ambulatory Visit (INDEPENDENT_AMBULATORY_CARE_PROVIDER_SITE_OTHER): Payer: Medicare Other | Admitting: Family Medicine

## 2015-09-29 ENCOUNTER — Ambulatory Visit (HOSPITAL_BASED_OUTPATIENT_CLINIC_OR_DEPARTMENT_OTHER)
Admission: RE | Admit: 2015-09-29 | Discharge: 2015-09-29 | Disposition: A | Discharge status: Home / Self Care | Payer: Medicare Other | Source: Ambulatory Visit | Attending: Family Medicine | Admitting: Family Medicine

## 2015-09-29 VITALS — BP 122/78 | HR 76 | Temp 97.7°F

## 2015-09-29 DIAGNOSIS — M4312 Spondylolisthesis, cervical region: Secondary | ICD-10-CM | POA: Diagnosis not present

## 2015-09-29 DIAGNOSIS — I10 Essential (primary) hypertension: Secondary | ICD-10-CM

## 2015-09-29 DIAGNOSIS — M4802 Spinal stenosis, cervical region: Secondary | ICD-10-CM | POA: Insufficient documentation

## 2015-09-29 DIAGNOSIS — M542 Cervicalgia: Secondary | ICD-10-CM | POA: Insufficient documentation

## 2015-09-29 DIAGNOSIS — T148 Other injury of unspecified body region: Secondary | ICD-10-CM

## 2015-09-29 DIAGNOSIS — M47892 Other spondylosis, cervical region: Secondary | ICD-10-CM | POA: Diagnosis not present

## 2015-09-29 DIAGNOSIS — T148XXA Other injury of unspecified body region, initial encounter: Secondary | ICD-10-CM

## 2015-09-29 DIAGNOSIS — M47812 Spondylosis without myelopathy or radiculopathy, cervical region: Secondary | ICD-10-CM | POA: Diagnosis not present

## 2015-09-29 MED ORDER — MELOXICAM 7.5 MG PO TABS: 7.5000 mg | ORAL_TABLET | Freq: Every day | ORAL | Status: DC | PRN

## 2015-09-29 MED ORDER — METHOCARBAMOL 500 MG PO TABS: 500.0000 mg | ORAL_TABLET | Freq: Every evening | ORAL | Status: DC | PRN

## 2015-09-29 NOTE — Patient Instructions (Signed)
Neck pain  Moist and gentle stretching Aspercreme or Salon Pas Lidocaine patches    Back pain is very common in adults.The cause of back pain is rarely dangerous and the pain often gets better over time.The cause of your back pain may not be known. Some common causes of back pain include:  Strain of the muscles or ligaments supporting the spine.  Wear and tear (degeneration) of the spinal disks.  Arthritis.  Direct injury to the back. For many people, back pain may return. Since back pain is rarely dangerous, most people can learn to manage this condition on their own. HOME CARE INSTRUCTIONS Watch your back pain for any changes. The following actions may help to lessen any discomfort you are feeling:  Remain active. It is stressful on your back to sit or stand in one place for long periods of time. Do not sit, drive, or stand in one place for more than 30 minutes at a time. Take short walks on even surfaces as soon as you are able.Try to increase the length of time you walk each day.  Exercise regularly as directed by your health care provider. Exercise helps your back heal faster. It also helps avoid future injury by keeping your muscles strong and flexible.  Do not stay in bed.Resting more than 1-2 days can delay your recovery.  Pay attention to your body when you bend and lift. The most comfortable positions are those that put less stress on your recovering back. Always use proper lifting techniques, including:  Bending your knees.  Keeping the load close to your body.  Avoiding twisting.  Find a comfortable position to sleep. Use a firm mattress and lie on your side with your knees slightly bent. If you lie on your back, put a pillow under your knees.  Avoid feeling anxious or stressed.Stress increases muscle tension and can worsen back pain.It is important to recognize when you are anxious or stressed and learn ways to manage it, such as with exercise.  Take medicines  only as directed by your health care provider. Over-the-counter medicines to reduce pain and inflammation are often the most helpful.Your health care provider may prescribe muscle relaxant drugs.These medicines help dull your pain so you can more quickly return to your normal activities and healthy exercise.  Apply ice to the injured area:  Put ice in a plastic bag.  Place a towel between your skin and the bag.  Leave the ice on for 20 minutes, 2-3 times a day for the first 2-3 days. After that, ice and heat may be alternated to reduce pain and spasms.  Maintain a healthy weight. Excess weight puts extra stress on your back and makes it difficult to maintain good posture. SEEK MEDICAL CARE IF:  You have pain that is not relieved with rest or medicine.  You have increasing pain going down into the legs or buttocks.  You have pain that does not improve in one week.  You have night pain.  You lose weight.  You have a fever or chills. SEEK IMMEDIATE MEDICAL CARE IF:   You develop new bowel or bladder control problems.  You have unusual weakness or numbness in your arms or legs.  You develop nausea or vomiting.  You develop abdominal pain.  You feel faint.   This information is not intended to replace advice given to you by your health care provider. Make sure you discuss any questions you have with your health care provider.   Document Released:  07/15/2005 Document Revised: 08/05/2014 Document Reviewed: 11/16/2013 Elsevier Interactive Patient Education [employer].

## 2015-09-29 NOTE — Progress Notes (Signed)
Pre visit review using our clinic review tool, if applicable. No additional management support is needed unless otherwise documented below in the visit note. 

## 2015-10-01 ENCOUNTER — Other Ambulatory Visit: Payer: Self-pay | Admitting: Family Medicine

## 2015-10-01 DIAGNOSIS — M542 Cervicalgia: Secondary | ICD-10-CM

## 2015-10-03 ENCOUNTER — Telehealth: Payer: Self-pay | Admitting: Family Medicine

## 2015-10-03 MED ORDER — DIAZEPAM 10 MG PO TABS: ORAL_TABLET | ORAL | Status: DC

## 2015-10-03 NOTE — Telephone Encounter (Signed)
Patient is having an MRI on Saturday and needs something sent in for being anxious. Advise please.

## 2015-10-03 NOTE — Telephone Encounter (Signed)
Faxed to [facility] on [address]. Patient aware.

## 2015-10-03 NOTE — Telephone Encounter (Signed)
Ok to give Diazepam 10mg  tab, 1/2 to 1 tab daily prn anxiety, disp #5

## 2015-10-03 NOTE — Telephone Encounter (Signed)
Pt called in. Spoke with CMA. Informed pt of the below.

## 2015-10-07 ENCOUNTER — Ambulatory Visit (HOSPITAL_BASED_OUTPATIENT_CLINIC_OR_DEPARTMENT_OTHER)
Admission: RE | Admit: 2015-10-07 | Discharge: 2015-10-07 | Disposition: A | Discharge status: Home / Self Care | Payer: Medicare Other | Source: Ambulatory Visit | Attending: Family Medicine | Admitting: Family Medicine

## 2015-10-07 DIAGNOSIS — M542 Cervicalgia: Secondary | ICD-10-CM

## 2015-10-07 DIAGNOSIS — M5030 Other cervical disc degeneration, unspecified cervical region: Secondary | ICD-10-CM | POA: Diagnosis not present

## 2015-10-07 DIAGNOSIS — M5021 Other cervical disc displacement,  high cervical region: Secondary | ICD-10-CM | POA: Diagnosis not present

## 2015-10-07 DIAGNOSIS — M5023 Other cervical disc displacement, cervicothoracic region: Secondary | ICD-10-CM | POA: Diagnosis not present

## 2015-10-07 DIAGNOSIS — M50221 Other cervical disc displacement at C4-C5 level: Secondary | ICD-10-CM | POA: Diagnosis not present

## 2015-10-07 DIAGNOSIS — M4802 Spinal stenosis, cervical region: Secondary | ICD-10-CM | POA: Insufficient documentation

## 2015-10-07 DIAGNOSIS — M47892 Other spondylosis, cervical region: Secondary | ICD-10-CM | POA: Insufficient documentation

## 2015-10-08 NOTE — Assessment & Plan Note (Signed)
Neck pain, no injury, Encouraged moist heat and gentle stretching as tolerated. May try NSAIDs and prescription meds as directed and report if symptoms worsen or seek immediate care. Methocarbimol

## 2015-10-08 NOTE — Progress Notes (Signed)
Subjective:    Patient ID: Laura [name], female    DOB: 26-Mar-1941, 75 y.o.   MRN: [id number]  Chief Complaint  Patient presents with  . Neck Pain    HPI Patient is in today for evaluation of neck pain, no injury but her neck has been hurting for last 3 weeks. No radicular symptoms. No fevers, chills, malaise or other concerns. Denies CP/palp/SOB/HA/congestion/fevers/GI or GU c/o. Taking meds as prescribed  Past Medical History  Diagnosis Date  . Chicken pox as a child  . Measles as a child  . Mumps as a child  . Hypertension 20 yrs ago  . Cancer ([place])     basal on back and nose  . Cataract   . 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 Hyperlipidemia   . Overweight(278.02)   . Other and unspecified hyperlipidemia 10/13/2013  . Fibrocystic breast 05/01/2014  . Medicare annual wellness visit, subsequent 02/01/2015  . Vasovagal reaction 07/15/2015    no syncope but close   . GERD (gastroesophageal reflux disease)     pt denies    Past Surgical History  Procedure Laterality Date  . Vaginal hysterectomy  75 yrs old    uterus removed  . Eye surgery  11-27-2010    cataract surgery in both eyes  . 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 Abdominal hysterectomy  29 yrs ago    heavy bleeding, ovaries left in place  . Colonoscopy    . Polypectomy      Family History  Problem Relation Age of Onset  . Heart disease Father   . Heart attack Father 11  . Hypertension Father   . Dementia Maternal Grandmother   . Heart disease Maternal Grandfather   . Hyperlipidemia Paternal Grandmother   . Cancer Paternal Grandfather     stomach  . Heart attack Paternal Grandfather   . Stomach cancer Paternal Grandfather   . Dementia Mother   . Pulmonary embolism Son   . Colon cancer Neg Hx   . Colon polyps Neg Hx   . Rectal cancer Neg Hx     Social History   Social History  . Marital Status: Married    Spouse Name: N/A  . Number of Children: N/A   . Years of Education: N/A   Occupational History  . Not on file.   Social History Main Topics  . Smoking status: Former Smoker -- 1.00 packs/day for 16 years    Types: Cigarettes    Start date: 07/29/1972  . Smokeless tobacco: Never Used  . Alcohol Use: 0.0 oz/week    0 Standard drinks or equivalent per week     Comment: special occasions gllass of wine  . Drug Use: No  . Sexual Activity: Yes     Comment: lives with husband no major dietary restrictions   Other Topics Concern  . Not on file   Social History Narrative    Outpatient Prescriptions Prior to Visit  Medication Sig Dispense Refill  . cholecalciferol (VITAMIN D) 1000 units tablet Take 1,000 Units by mouth daily.    . clindamycin (CLEOCIN T) 1 % lotion Apply 30 application topically as needed. Reported on 09/05/2015  0  . Fiber, Guar Gum, CHEW Chew by mouth as needed.    Marland Kitchen losartan-hydrochlorothiazide (HYZAAR) 50-12.5 MG per tablet Take 1 tablet by mouth daily. 90 tablet 2  . Multiple Vitamins-Minerals (MULTI-VITAMIN GUMMIES) CHEW Chew 1 tablet by mouth  daily.     No facility-administered medications prior to visit.    Allergies  Allergen Reactions  . Lisinopril     dizzy  . Statins     Muscle soreness   . Sulfa Antibiotics Other (See Comments)    "feels feverish"  . Tetracyclines & Related     GI upset    Review of Systems  Constitutional: Negative for fever and malaise/fatigue.  HENT: Negative for congestion.   Eyes: Negative for blurred vision.  Respiratory: Negative for shortness of breath.   Cardiovascular: Negative for chest pain, palpitations and leg swelling.  Gastrointestinal: Negative for nausea, abdominal pain and blood in stool.  Genitourinary: Negative for dysuria and frequency.  Musculoskeletal: Positive for back pain. Negative for falls.  Skin: Negative for rash.  Neurological: Negative for dizziness, loss of consciousness and headaches.  Endo/Heme/Allergies: Negative for environmental  allergies.  Psychiatric/Behavioral: Negative for depression. The patient is not nervous/anxious.        Objective:    Physical Exam  Constitutional: She is oriented to person, place, and time. She appears well-developed and well-nourished. No distress.  HENT:  Head: Normocephalic and atraumatic.  Nose: Nose normal.  Eyes: Right eye exhibits no discharge. Left eye exhibits no discharge.  Neck: Normal range of motion. Neck supple.  Cardiovascular: Normal rate and regular rhythm.   No murmur heard. Pulmonary/Chest: Effort normal and breath sounds normal.  Abdominal: Soft. Bowel sounds are normal. There is no tenderness.  Musculoskeletal: She exhibits no edema.  Neurological: She is alert and oriented to person, place, and time.  Skin: Skin is warm and dry.  Psychiatric: She has a normal mood and affect.  Nursing note and vitals reviewed.   BP 122/78 mmHg  Pulse 76  Temp(Src) 97.7 F (36.5 C) (Oral)  SpO2 98% Wt Readings from Last 3 Encounters:  09/19/15 173 lb (78.472 kg)  09/05/15 173 lb (78.472 kg)  07/10/15 168 lb (76.204 kg)     Lab Results  Component Value Date   WBC 6.2 01/17/2015   HGB 13.8 01/17/2015   HCT 40.7 01/17/2015   PLT 273.0 01/17/2015   GLUCOSE 83 01/17/2015   CHOL 227* 01/17/2015   TRIG 150.0* 01/17/2015   HDL 51.30 01/17/2015   LDLCALC 146* 01/17/2015   ALT 12 01/17/2015   AST 16 01/17/2015   NA 140 01/17/2015   K 3.8 01/17/2015   CL 104 01/17/2015   CREATININE 0.90 01/17/2015   BUN 23 01/17/2015   CO2 29 01/17/2015   TSH 2.73 01/17/2015    Lab Results  Component Value Date   TSH 2.73 01/17/2015   Lab Results  Component Value Date   WBC 6.2 01/17/2015   HGB 13.8 01/17/2015   HCT 40.7 01/17/2015   MCV 86.5 01/17/2015   PLT 273.0 01/17/2015   Lab Results  Component Value Date   NA 140 01/17/2015   K 3.8 01/17/2015   CO2 29 01/17/2015   GLUCOSE 83 01/17/2015   BUN 23 01/17/2015   CREATININE 0.90 01/17/2015   BILITOT 0.4  01/17/2015   ALKPHOS 54 01/17/2015   AST 16 01/17/2015   ALT 12 01/17/2015   PROT 7.2 01/17/2015   ALBUMIN 4.2 01/17/2015   CALCIUM 10.1 01/17/2015   GFR 65.29 01/17/2015   Lab Results  Component Value Date   CHOL 227* 01/17/2015   Lab Results  Component Value Date   HDL 51.30 01/17/2015   Lab Results  Component Value Date   LDLCALC 146* 01/17/2015  Lab Results  Component Value Date   TRIG 150.0* 01/17/2015   Lab Results  Component Value Date   CHOLHDL 4 01/17/2015   No results found for: HGBA1C     Assessment & Plan:   Problem List Items Addressed This Visit    HTN (hypertension)    Well controlled, no changes to meds. Encouraged heart healthy diet such as the DASH diet and exercise as tolerated.       Muscle strain    Neck pain, no injury, Encouraged moist heat and gentle stretching as tolerated. May try NSAIDs and prescription meds as directed and report if symptoms worsen or seek immediate care. Methocarbimol       Other Visit Diagnoses    Bilateral neck pain    -  Primary    Relevant Orders    DG Cervical Spine Complete (Completed)       I am having Ms. Gaw start on meloxicam and methocarbamol. I am also having her maintain her Fiber (Guar Gum), MULTI-VITAMIN GUMMIES, losartan-hydrochlorothiazide, clindamycin, and cholecalciferol.  Meds ordered this encounter  Medications  . meloxicam (MOBIC) 7.5 MG tablet    Sig: Take 1 tablet (7.5 mg total) by mouth daily as needed for pain (with food).    Dispense:  30 tablet    Refill:  0  . methocarbamol (ROBAXIN) 500 MG tablet    Sig: Take 1 tablet (500 mg total) by mouth at bedtime as needed for muscle spasms.    Dispense:  30 tablet    Refill:  0     Penni Homans, MD

## 2015-10-08 NOTE — Assessment & Plan Note (Signed)
Well controlled, no changes to meds. Encouraged heart healthy diet such as the DASH diet and exercise as tolerated.  °

## 2015-10-09 ENCOUNTER — Other Ambulatory Visit: Payer: Self-pay | Admitting: Family Medicine

## 2015-10-09 DIAGNOSIS — M4802 Spinal stenosis, cervical region: Secondary | ICD-10-CM

## 2015-10-10 DIAGNOSIS — Z6827 Body mass index (BMI) 27.0-27.9, adult: Secondary | ICD-10-CM | POA: Diagnosis not present

## 2015-10-10 DIAGNOSIS — M542 Cervicalgia: Secondary | ICD-10-CM | POA: Diagnosis not present

## 2015-10-28 ENCOUNTER — Other Ambulatory Visit: Payer: Self-pay | Admitting: Family Medicine

## 2015-11-01 ENCOUNTER — Encounter: Payer: Self-pay | Admitting: Physical Therapy

## 2015-11-01 ENCOUNTER — Ambulatory Visit: Payer: Medicare Other | Attending: Neurosurgery | Admitting: Physical Therapy

## 2015-11-01 DIAGNOSIS — M436 Torticollis: Secondary | ICD-10-CM | POA: Insufficient documentation

## 2015-11-01 DIAGNOSIS — M542 Cervicalgia: Secondary | ICD-10-CM | POA: Insufficient documentation

## 2015-11-01 NOTE — Therapy (Signed)
[facility] [address]  [place], [place], [address] Phone: [phone]   Fax:  [phone]  Physical Therapy Evaluation  Patient Details  Name: Laura [name] MRN: [id number] Date of Birth: 25-Feb-1941 Referring Provider: Ashok Pall, MD  Encounter Date: 11/01/2015      PT End of Session - 11/01/15 0945    Visit Number 1   Number of Visits 12   Date for PT Re-Evaluation 12/13/15   PT Start Time 0932   PT Stop Time [id number]   PT Time Calculation (min) 57 min      Past Medical History  Diagnosis Date  . Chicken pox as a child  . Measles as a child  . Mumps as a child  . Hypertension 20 yrs ago  . Cancer ([place])     basal on back and nose  . Cataract   . 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 Hyperlipidemia   . Overweight(278.02)   . Other and unspecified hyperlipidemia 10/13/2013  . Fibrocystic breast 05/01/2014  . Medicare annual wellness visit, subsequent 02/01/2015  . Vasovagal reaction 07/15/2015    no syncope but close   . GERD (gastroesophageal reflux disease)     pt denies    Past Surgical History  Procedure Laterality Date  . Vaginal hysterectomy  75 yrs old    uterus removed  . Eye surgery  11-27-2010    cataract surgery in both eyes  . 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 Abdominal hysterectomy  75 yrs ago    heavy bleeding, ovaries left in place  . Colonoscopy    . Polypectomy      There were no vitals filed for this visit.  Visit Diagnosis:  Neck pain  Neck stiffness      Subjective Assessment - 11/01/15 0943    Subjective pt with c/o neck pain over the psat 2 months.  States pain onset was noted as she attempted to get out of back seat of car.  Currently, difficulty with neck rotation and with return from flexed posture. denies n/t.   Currently in Pain? Yes   Pain Score 6    Pain Location Neck   Pain Orientation Left   Pain Radiating  Towards L upper scapular muscles, L neck, and into L head   Aggravating Factors  neck AROM; increases throughout the day; weather changes   Pain Relieving Factors rest            [facility] PT Assessment - 11/01/15 0001    Assessment   Medical Diagnosis neck pain   Referring Provider Ashok Pall, MD   Onset Date/Surgical Date 09/13/15   Next MD Visit 11/08/15?   Balance Screen   Has the patient fallen in the past 6 months No   Has the patient had a decrease in activity level because of a fear of falling?  No   Is the patient reluctant to leave their home because of a fear of falling?  No   Prior Function   Leisure studies for bible education; denies regular exercise   Observation/Other Assessments   Focus on Therapeutic Outcomes (FOTO)  30% limitation   ROM / Strength   AROM / PROM / Strength AROM   AROM   AROM Assessment Site Cervical   Cervical Extension 45  L neck pain   Cervical - Right Side Bend 14   Cervical - Left  Side Bend 10  L neck pain   Cervical - Right Rotation 47  L neck pain   Cervical - Left Rotation 60         TODAY'S TREATMENT HEP instruct Mechanical Traction - static, c-sping, 20dg pull, 8#, 8' with IFC (80-150Hz ) x 10' to B C-spine                  PT Education - 11/11/2015 1022    Education provided Yes   Education Details UT stretch   Person(s) Educated Patient   Methods Explanation;Demonstration;Handout   Comprehension Verbalized understanding             PT Long Term Goals - 11/11/2015 1040    PT LONG TERM GOAL #1   Title Independent with advanced HEP sa necessary by 12/13/15   Status New   PT LONG TERM GOAL #2   Title Neck AROM symmetric and WFL by 12/13/15   Status New   PT LONG TERM GOAL #3   Title pt able to perform ADLs, chores, and drive without restriction by neck pain or LOM by 12/13/15   Status New               Plan - 11-11-15 0953    Clinical Impression Statement Pt with 2 month history of neck pain.   She states pain is in L neck extending from upper scapular muscles to top of head. Very TTP L upper t-spine paraspinals and 1st rib area and she notes pain in this area with c-spine AROM.  Mild restriction noted with c-spine extension, moderate to severe restrictions with B side-bending, and moderate restriction with R rotation (L rotation fairly good at 60 degrees).  B Shoulder AROM and MMT is WNL without c/o pain.  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  Pt seems to have triggered a form of pain-spasm-pain cycle in left neck and upper scapular muscles.  Likely due to inflammatory response related to degenerative issues in c-spine which were exacerbated by difficult car transfer 2 months ago.  She should respond well with PT and hopefully return to baseline within 4-6 wks.   Pt will benefit from skilled therapeutic intervention in order to improve on the following deficits Pain;Postural dysfunction;Decreased range of motion   Rehab Potential Good   PT Frequency 2x / week   PT Duration 6 weeks  4-6wks   PT Treatment/Interventions Manual techniques;Therapeutic exercise;Therapeutic activities;Moist Heat;Electrical Stimulation;Cryotherapy;Traction;Ultrasound;Taping;Dry needling   PT Next Visit Plan STM and TPR as tolerated to L neck and scpapular muscles; modalities to this area PRN (100% [place], e-stim, combo, dry needling, taping, etc); progress traction slowly as pt is apprehensive about this.  Try static 10# next treatment for 10' then maybe move to intermittent   Consulted and Agree with Plan of Care Patient          G-Codes - 11-11-15 0945    Functional Assessment Tool Used foto 30% limitation   Functional Limitation Carrying, moving and handling objects   Carrying, Moving and Handling Objects Current Status [phone]) At least 20 percent but less than 40 percent impaired, limited or restricted   Carrying, Moving and Handling Objects Goal Status [id number]) At least 20 percent but  less than 40 percent impaired, limited or restricted       Problem List Patient Active Problem List   Diagnosis Date Noted  . Vasovagal reaction 07/15/2015  . Acute pharyngitis 07/15/2015  . History of colonic polyps 07/15/2015  . Medicare  annual wellness visit, subsequent 02/01/2015  . Muscle strain 08/11/2014  . Hyperlipidemia 07/18/2014  . Fibrocystic breast 05/01/2014  . 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 Decreased visual acuity 01/23/2014  . HTN (hypertension) 01/23/2014  . Sun-damaged skin 01/23/2014  . Hot flashes 01/23/2014  . Recurrent BCC (basal cell carcinoma) 10/13/2013  . Vitamin D deficiency 10/13/2013  . Pain in joint, ankle and foot 10/13/2013  . Cancer ([place])   . Cataract   . EBV infection   . Overweight     Nijee Heatwole PT, OCS 11/01/2015, 10:43 AM  [facility] [address]  [place], [place], [address] Phone: [phone]   Fax:  [phone]  Name: Raelei Bousman MRN: [id number] Date of Birth: [birth year]

## 2015-11-03 ENCOUNTER — Ambulatory Visit: Payer: Medicare Other | Admitting: Physical Therapy

## 2015-11-03 DIAGNOSIS — M542 Cervicalgia: Secondary | ICD-10-CM | POA: Diagnosis not present

## 2015-11-03 DIAGNOSIS — M436 Torticollis: Secondary | ICD-10-CM | POA: Diagnosis not present

## 2015-11-03 NOTE — Therapy (Signed)
[facility] [address]  [place], [place], [address] Phone: [phone]   Fax:  [phone]  Physical Therapy Treatment  Patient Details  Name: Laura [name] MRN: [id number] Date of Birth: 17-Jan-1941 Referring Provider: Ashok Pall, MD  Encounter Date: 11/03/2015      PT End of Session - 11/03/15 1051    Visit Number 2   Number of Visits 12   Date for PT Re-Evaluation 12/13/15   PT Start Time 1020   PT Stop Time 1111   PT Time Calculation (min) 51 min   Activity Tolerance Patient tolerated treatment well   Behavior During Therapy [facility] for tasks assessed/performed      Past Medical History  Diagnosis Date  . Chicken pox as a child  . Measles as a child  . Mumps as a child  . Hypertension 20 yrs ago  . Cancer ([place])     basal on back and nose  . Cataract   . 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 Hyperlipidemia   . Overweight(278.02)   . Other and unspecified hyperlipidemia 10/13/2013  . Fibrocystic breast 05/01/2014  . Medicare annual wellness visit, subsequent 02/01/2015  . Vasovagal reaction 07/15/2015    no syncope but close   . GERD (gastroesophageal reflux disease)     pt denies    Past Surgical History  Procedure Laterality Date  . Vaginal hysterectomy  75 yrs old    uterus removed  . Eye surgery  11-27-2010    cataract surgery in both eyes  . 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 Abdominal hysterectomy  29 yrs ago    heavy bleeding, ovaries left in place  . Colonoscopy    . Polypectomy      There were no vitals filed for this visit.      Subjective Assessment - 11/03/15 1025    Subjective Pt noting continued pain in certain positions especially forward head and UT stretch positions but denies pain when head held in neutral position. Pt denies pain upon waking from sleep.   Currently in Pain? No/denies   Pain Score --  up to 3-4/10 when present    Pain Location Neck           TODAY'S TREATMENT  Manual STM to B UT & levator Multiple TPR to L UT & levator Grade 1-2 posterior L 1st & 2nd rib mobs Gentle PROM to cervical spine in all planes  Mechanical Traction - static, c-spine, 20dg pull, 10#, 10' with IFC (80-150Hz ) x 10' to B C-spine           PT Long Term Goals - 11/03/15 1225    PT LONG TERM GOAL #1   Title Independent with advanced HEP sa necessary by 12/13/15   Status On-going   PT LONG TERM GOAL #2   Title Neck AROM symmetric and WFL by 12/13/15   Status On-going   PT LONG TERM GOAL #3   Title pt able to perform ADLs, chores, and drive without restriction by neck pain or LOM by 12/13/15   Status On-going               Plan - 11/03/15 1225    Clinical Impression Statement Pt reporting decreased pain since initial eval, currently [id number] and only in certain positions such as UT stretch position and forward head movements. L UT and levator responded positively to  STM and TPR with decreased muscle tension and improved flexibility noted by end of session, although pt still reporting crepitus in left side of neck with movement.   Rehab Potential Good   PT Treatment/Interventions Manual techniques;Therapeutic exercise;Therapeutic activities;Moist Heat;Electrical Stimulation;Cryotherapy;Traction;Ultrasound;Taping;Dry needling   PT Next Visit Plan STM and TPR as tolerated to L neck and scpapular muscles; modalities to this area PRN (100% [place], e-stim, combo, dry needling, taping, etc); progress traction slowly as pt is apprehensive about this.  Try static 10# next treatment for 10' then maybe move to intermittent      Patient will benefit from skilled therapeutic intervention in order to improve the following deficits and impairments:  Pain, Postural dysfunction, Decreased range of motion  Visit Diagnosis: Neck pain  Neck stiffness     Problem List Patient Active Problem List   Diagnosis Date Noted  .  Vasovagal reaction 07/15/2015  . Acute pharyngitis 07/15/2015  . History of colonic polyps 07/15/2015  . Medicare annual wellness visit, subsequent 02/01/2015  . Muscle strain 08/11/2014  . Hyperlipidemia 07/18/2014  . Fibrocystic breast 05/01/2014  . 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 Decreased visual acuity 01/23/2014  . HTN (hypertension) 01/23/2014  . Sun-damaged skin 01/23/2014  . Hot flashes 01/23/2014  . Recurrent BCC (basal cell carcinoma) 10/13/2013  . Vitamin D deficiency 10/13/2013  . Pain in joint, ankle and foot 10/13/2013  . Cancer ([place])   . Cataract   . EBV infection   . Overweight     Percival Spanish, PT, MPT 11/03/2015, 12:30 PM  [facility] [address]  [place], [place], [address] Phone: [phone]   Fax:  [phone]  Name: Laura [name] MRN: [id number] Date of Birth: 06-Jul-1941

## 2015-11-07 DIAGNOSIS — Z6828 Body mass index (BMI) 28.0-28.9, adult: Secondary | ICD-10-CM | POA: Diagnosis not present

## 2015-11-07 DIAGNOSIS — M47892 Other spondylosis, cervical region: Secondary | ICD-10-CM | POA: Diagnosis not present

## 2015-11-08 ENCOUNTER — Ambulatory Visit: Payer: Medicare Other | Admitting: Physical Therapy

## 2015-11-08 ENCOUNTER — Telehealth: Payer: Self-pay | Admitting: Family Medicine

## 2015-11-08 ENCOUNTER — Telehealth: Payer: Self-pay | Admitting: Medical

## 2015-11-08 DIAGNOSIS — M542 Cervicalgia: Secondary | ICD-10-CM | POA: Diagnosis not present

## 2015-11-08 DIAGNOSIS — M436 Torticollis: Secondary | ICD-10-CM | POA: Diagnosis not present

## 2015-11-08 NOTE — Telephone Encounter (Signed)
Clarify with pt. Take celebrex day before and day of offiice visit. Then check bp. Also day before she starts celebrex check bp if she has cuff at home. That way we can get idea of how it effects her bp.

## 2015-11-08 NOTE — Telephone Encounter (Signed)
Spoke with pt and advised her to take the medication on Sunday and Monday prior to the visit and to come in for a BP check on Wednesday. Pt voices understanding//hm

## 2015-11-08 NOTE — Telephone Encounter (Signed)
°  Relation to [id number] Call back number:[phone] Pharmacy:  Reason for call: pt wants to make Dr. Charlett Blake aware that her neurologist put her on celebrex on yesterday 100 mg 1 pill a day, pt states she has high blood pressure and want to know if it is ok for her to take the meds.

## 2015-11-08 NOTE — Telephone Encounter (Signed)
[place] for BP check on Tuesday? Will put patient on Nurse visit schedule.

## 2015-11-08 NOTE — Telephone Encounter (Signed)
Spoke with pt and she states that she does not have a BP cuff at home and was advised to the medication before the visit and the day of the visit.

## 2015-11-08 NOTE — Therapy (Signed)
[facility] [address]  [place], [place], [address] Phone: [phone]   Fax:  [phone]  Physical Therapy Treatment  Patient Details  Name: Laura [name] MRN: [id number] Date of Birth: [birth year] Referring Provider: Ashok Pall, MD  Encounter Date: 11/08/2015      PT End of Session - 11/08/15 0941    Visit Number 3   Number of Visits 12   Date for PT Re-Evaluation 12/13/15   PT Start Time 0939   PT Stop Time 1015   PT Time Calculation (min) 36 min      Past Medical History  Diagnosis Date  . Chicken pox as a child  . Measles as a child  . Mumps as a child  . Hypertension 20 yrs ago  . Cancer ([place])     basal on back and nose  . Cataract   . 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 Hyperlipidemia   . Overweight(278.02)   . Other and unspecified hyperlipidemia 10/13/2013  . Fibrocystic breast 05/01/2014  . Medicare annual wellness visit, subsequent 02/01/2015  . Vasovagal reaction 07/15/2015    no syncope but close   . GERD (gastroesophageal reflux disease)     pt denies    Past Surgical History  Procedure Laterality Date  . Vaginal hysterectomy  75 yrs old    uterus removed  . Eye surgery  11-27-2010    cataract surgery in both eyes  . 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 Abdominal hysterectomy  29 yrs ago    heavy bleeding, ovaries left in place  . Colonoscopy    . Polypectomy      There were no vitals filed for this visit.      Subjective Assessment - 11/08/15 0941    Subjective States noted increased pain for 2 days following last treatment. She states she met with MD yesterday and she states she was advised that she's unlikely to improve.  She states if her symptoms worsen or if she begins to note radicular symptoms in UE's she may need a c-spine fusion. Pt states she therefore no longer wants to particiapte in PT. However, at end of treatment  today she decides she'd rather continue with PT after all and is scheduled to return next week.   Currently in Pain? Yes   Pain Score --  2-3/10   Pain Location Neck   Pain Orientation Left          TODAY'S TREATMENT Manual - gentle STM throughout L UT and upper scap mms with pt seated and grade 1 caudal glides L 1st rib  IFC (80-_0 ) with MHP to c-spine with pt seated 18'             PT Long Term Goals - 11/03/15 1225    PT LONG TERM GOAL #1   Title Independent with advanced HEP sa necessary by 12/13/15   Status On-going   PT LONG TERM GOAL #2   Title Neck AROM symmetric and WFL by 12/13/15   Status On-going   PT LONG TERM GOAL #3   Title pt able to perform ADLs, chores, and drive without restriction by neck pain or LOM by 12/13/15   Status On-going               Plan - 11/08/15 1153    Clinical Impression Statement upon arrival today, pt initially states she no  longer wants to participate in PT due to feeling she will not benefit.  She then asked for manual work to neck and e-stim so this was performed.  Following treatment, she decided to continue with PT and is scheduled to return next week.  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   PT Next Visit Plan STM and TPR as tolerated to L neck and scpapular muscles; modalities to this area PRN (100% [place], e-stim, combo, dry needling, taping, etc); progress traction if desired to try again   Consulted and Agree with Plan of Care Patient      Patient will benefit from skilled therapeutic intervention in order to improve the following deficits and impairments:  Pain, Postural dysfunction, Decreased range of motion  Visit Diagnosis: Cervicalgia     Problem List Patient Active Problem List   Diagnosis Date Noted  .  Vasovagal reaction 07/15/2015  . Acute pharyngitis 07/15/2015  . History of colonic polyps 07/15/2015  . Medicare annual wellness visit, subsequent 02/01/2015  . Muscle strain 08/11/2014  . Hyperlipidemia 07/18/2014  . Fibrocystic breast 05/01/2014  . 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 Decreased visual acuity 01/23/2014  . HTN (hypertension) 01/23/2014  . Sun-damaged skin 01/23/2014  . Hot flashes 01/23/2014  . Recurrent BCC (basal cell carcinoma) 10/13/2013  . Vitamin D deficiency 10/13/2013  . Pain in joint, ankle and foot 10/13/2013  . Cancer ([place])   . Cataract   . EBV infection   . Overweight     Annaliese Saez PT, OCS 11/08/2015, 11:56 AM  [facility] [address]  [place], [place], [address] Phone: [phone]   Fax:  [phone]  Name: Laura [name] MRN: [id number] Date of Birth: 12-06-1940

## 2015-11-08 NOTE — Telephone Encounter (Signed)
Please advise 

## 2015-11-08 NOTE — Telephone Encounter (Signed)
nsaids can increase blood pressure to some degree. She might wait to start this on Sunday. Then come in on Monday for bp check. So can assess her bp and effect on her.

## 2015-11-15 ENCOUNTER — Ambulatory Visit: Payer: Medicare Other | Admitting: Physical Therapy

## 2015-11-15 DIAGNOSIS — M542 Cervicalgia: Secondary | ICD-10-CM

## 2015-11-15 DIAGNOSIS — M436 Torticollis: Secondary | ICD-10-CM | POA: Diagnosis not present

## 2015-11-15 NOTE — Therapy (Signed)
[facility] [address]  [place], [place], [address] Phone: [phone]   Fax:  [phone]  Physical Therapy Treatment  Patient Details  Name: Laura [name] MRN: [id number] Date of Birth: 27-Sep-1940 Referring Provider: Ashok Pall, MD  Encounter Date: 11/15/2015      PT End of Session - 11/15/15 1015    Visit Number 4   Number of Visits 12   Date for PT Re-Evaluation 12/13/15   PT Start Time 1013   PT Stop Time 1110   PT Time Calculation (min) 57 min      Past Medical History  Diagnosis Date  . Chicken pox as a child  . Measles as a child  . Mumps as a child  . Hypertension 20 yrs ago  . Cancer ([place])     basal on back and nose  . Cataract   . 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 Hyperlipidemia   . Overweight(278.02)   . Other and unspecified hyperlipidemia 10/13/2013  . Fibrocystic breast 05/01/2014  . Medicare annual wellness visit, subsequent 02/01/2015  . Vasovagal reaction 07/15/2015    no syncope but close   . GERD (gastroesophageal reflux disease)     pt denies    Past Surgical History  Procedure Laterality Date  . Vaginal hysterectomy  75 yrs old    uterus removed  . Eye surgery  11-27-2010    cataract surgery in both eyes  . 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 Abdominal hysterectomy  29 yrs ago    heavy bleeding, ovaries left in place  . Colonoscopy    . Polypectomy      There were no vitals filed for this visit.      Subjective Assessment - 11/15/15 1015    Subjective states had 3 good days since last treatment but states neck pain increased once weather changed a couple days ago.  States neck pain has been 6-7/10 past couple days.   Currently in Pain? Yes   Pain Score --  6-7/10 past couple days   Pain Location Neck   Pain Orientation Left   Pain Radiating Towards extends into L upper scap mms, neck and L scalp       TODAY'S  TREATMENT Manual -  Seated gentle STM throughout L UT and upper scap mms d and grade 2 caudal glides L 1st rib Supine- grade 2 and 3 MFR B UT, grade 2 c-spine B side-bending mobs  IFC (80-150Hz ) with MHP to c-spine with pt seated 16'             PT Long Term Goals - 11/03/15 1225    PT LONG TERM GOAL #1   Title Independent with advanced HEP sa necessary by 12/13/15   Status On-going   PT LONG TERM GOAL #2   Title Neck AROM symmetric and WFL by 12/13/15   Status On-going   PT LONG TERM GOAL #3   Title pt able to perform ADLs, chores, and drive without restriction by neck pain or LOM by 12/13/15   Status On-going               Plan - 11/15/15 1056    Clinical Impression Statement pt noted 3 days of benefit following last treatmen so similar treatment performed today with more time spent with manual to include supine treatment.  Pt reported feeling "relaxed" after manual.  PT Next Visit Plan STM and TPR as tolerated to L neck and scpapular muscles; modalities to this area PRN (100% [place], e-stim, combo, dry needling, taping, etc); progress traction if desired to try again   Consulted and Agree with Plan of Care Patient      Patient will benefit from skilled therapeutic intervention in order to improve the following deficits and impairments:  Pain, Postural dysfunction, Decreased range of motion  Visit Diagnosis: Cervicalgia     Problem List Patient Active Problem List   Diagnosis Date Noted  . Vasovagal reaction 07/15/2015  . Acute pharyngitis 07/15/2015  . History of colonic polyps 07/15/2015  . Medicare annual wellness visit, subsequent 02/01/2015  . Muscle strain 08/11/2014  . Hyperlipidemia 07/18/2014  . Fibrocystic breast 05/01/2014  . 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 Decreased visual acuity 01/23/2014  . HTN (hypertension) 01/23/2014  . Sun-damaged skin 01/23/2014  . Hot flashes 01/23/2014  . Recurrent BCC (basal cell carcinoma) 10/13/2013  . Vitamin D  deficiency 10/13/2013  . Pain in joint, ankle and foot 10/13/2013  . Cancer ([place])   . Cataract   . EBV infection   . Overweight     Laura [name] PT, OCS 11/15/2015, 11:37 AM  [facility] [address]  [place], [place], [address] Phone: [phone]   Fax:  [phone]  Name: Laura [name] MRN: [id number] Date of Birth: 08/31/1940

## 2015-11-17 ENCOUNTER — Ambulatory Visit: Payer: Medicare Other | Admitting: Physical Therapy

## 2015-11-17 DIAGNOSIS — M436 Torticollis: Secondary | ICD-10-CM | POA: Diagnosis not present

## 2015-11-17 DIAGNOSIS — M542 Cervicalgia: Secondary | ICD-10-CM

## 2015-11-17 NOTE — Therapy (Signed)
[facility] [address]  [place], [place], [address] Phone: [phone]   Fax:  [phone]  Physical Therapy Treatment  Patient Details  Name: Laura [name] MRN: [id number] Date of Birth: Oct 13, 1940 Referring Provider: Ashok Pall, MD  Encounter Date: 11/17/2015      PT End of Session - 11/17/15 0844    Visit Number 5   Number of Visits 12   Date for PT Re-Evaluation 12/13/15   PT Start Time [id number]   PT Stop Time 0937   PT Time Calculation (min) 53 min      Past Medical History  Diagnosis Date  . Chicken pox as a child  . Measles as a child  . Mumps as a child  . Hypertension 20 yrs ago  . Cancer ([place])     basal on back and nose  . Cataract   . 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 Hyperlipidemia   . Overweight(278.02)   . Other and unspecified hyperlipidemia 10/13/2013  . Fibrocystic breast 05/01/2014  . Medicare annual wellness visit, subsequent 02/01/2015  . Vasovagal reaction 07/15/2015    no syncope but close   . GERD (gastroesophageal reflux disease)     pt denies    Past Surgical History  Procedure Laterality Date  . Vaginal hysterectomy  75 yrs old    uterus removed  . Eye surgery  11-27-2010    cataract surgery in both eyes  . 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 Abdominal hysterectomy  29 yrs ago    heavy bleeding, ovaries left in place  . Colonoscopy    . Polypectomy      There were no vitals filed for this visit.      Subjective Assessment - 11/17/15 0845    Subjective States felt felt "okay" yesterday but more sore today with ache pain in neck and head today rating 3-4/10.   Currently in Pain? Yes   Pain Score --  3-4/10   Pain Location Neck   Pain Orientation Left           TODAY'S TREATMENT IFC (80-150Hz ) with MHP to c-spine with pt seated 15'  Manual - Supine - grade 3 MFR B UT, grade 2 L 1st rib caudal glides; grade  2 c-spine B side-bending and lower c-spine side-glide mobs; STM and stretch B UT and scalenes         PT Long Term Goals - 11/03/15 1225    PT LONG TERM GOAL #1   Title Independent with advanced HEP sa necessary by 12/13/15   Status On-going   PT LONG TERM GOAL #2   Title Neck AROM symmetric and WFL by 12/13/15   Status On-going   PT LONG TERM GOAL #3   Title pt able to perform ADLs, chores, and drive without restriction by neck pain or LOM by 12/13/15   Status On-going               Plan - 11/17/15 [id number]    Clinical Impression Statement Pt seen here 2 days ago at which time c/o 6-7/10 pain.  States she felt "okay" yesterday but that pain returned this AM.  Rates pain in L neck and head 3-4/10 and describes as an ache this AM so overall is improving with regard to pain intensity.  Today's treatment well tolerated - performed MHP and e-stim first to see if helped improve  mobility with manual.  No sig difference noted vs last treatment with this.  Pt out of town next week and we will progress to some scapular exercises/HEP upon return.   PT Next Visit Plan scapular retraction exercises; STM and TPR as tolerated to L neck and scpapular muscles; modalities to this area PRN (100% [place], e-stim, combo, dry needling, taping, etc); progress traction if desired to try again   Consulted and Agree with Plan of Care Patient      Patient will benefit from skilled therapeutic intervention in order to improve the following deficits and impairments:  Pain, Postural dysfunction, Decreased range of motion  Visit Diagnosis: Cervicalgia     Problem List Patient Active Problem List   Diagnosis Date Noted  . Vasovagal reaction 07/15/2015  . Acute pharyngitis 07/15/2015  . History of colonic polyps 07/15/2015  . Medicare annual wellness visit, subsequent 02/01/2015  . Muscle strain 08/11/2014  . Hyperlipidemia 07/18/2014  . Fibrocystic breast 05/01/2014  . 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 Decreased  visual acuity 01/23/2014  . HTN (hypertension) 01/23/2014  . Sun-damaged skin 01/23/2014  . Hot flashes 01/23/2014  . Recurrent BCC (basal cell carcinoma) 10/13/2013  . Vitamin D deficiency 10/13/2013  . Pain in joint, ankle and foot 10/13/2013  . Cancer ([place])   . Cataract   . EBV infection   . Overweight     Laura [name] PT, OCS 11/17/2015, 12:05 PM  [facility] [address]  [name], [place], [address] Phone: [phone]   Fax:  [phone]  Name: Laura [name] MRN: [id number] Date of Birth: Aug 16, 1940

## 2015-11-29 ENCOUNTER — Ambulatory Visit: Payer: Medicare Other | Attending: Neurosurgery | Admitting: Physical Therapy

## 2015-11-29 DIAGNOSIS — M542 Cervicalgia: Secondary | ICD-10-CM | POA: Diagnosis not present

## 2015-11-29 NOTE — Therapy (Signed)
[facility] [address]  [place], [place], [address] Phone: [phone]   Fax:  [phone]  Physical Therapy Treatment  Patient Details  Name: Laura [name] MRN: [id number] Date of Birth: 1941-06-26 Referring Provider: Ashok Pall, MD  Encounter Date: 11/29/2015      PT End of Session - 11/29/15 0926    Visit Number 6   Number of Visits 12   Date for PT Re-Evaluation 12/13/15   PT Start Time 0925   PT Stop Time 1010   PT Time Calculation (min) 45 min      Past Medical History  Diagnosis Date  . Chicken pox as a child  . Measles as a child  . Mumps as a child  . Hypertension 20 yrs ago  . Cancer ([place])     basal on back and nose  . Cataract   . 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 Hyperlipidemia   . Overweight(278.02)   . Other and unspecified hyperlipidemia 10/13/2013  . Fibrocystic breast 05/01/2014  . Medicare annual wellness visit, subsequent 02/01/2015  . Vasovagal reaction 07/15/2015    no syncope but close   . GERD (gastroesophageal reflux disease)     pt denies    Past Surgical History  Procedure Laterality Date  . Vaginal hysterectomy  75 yrs old    uterus removed  . Eye surgery  11-27-2010    cataract surgery in both eyes  . 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 Abdominal hysterectomy  75 yrs ago    heavy bleeding, ovaries left in place  . Colonoscopy    . Polypectomy      There were no vitals filed for this visit.      Subjective Assessment - 11/29/15 0926    Subjective States was out of town for one week since last treatment. States weather was rainy most of the time and her neck seemed to hurt more on the rainy days. States pain was 6-7/10 with the rain. States pain has improved since returning home and today is "very slight"   Currently in Pain? Yes   Pain Score --  1-2/10 currently, was 6-7/10 while out of town.   Pain Location Neck    Pain Orientation Left          TODAY'S TREATMENT Manual - Supine - grade 3 MFR B UT, grade 2 L 1st rib caudal glides; grade 2 c-spine B side-bending and lower c-spine side-glide mobs; STM and stretch B UT; B Rotation mobs grade 2 and 3  TherEx - Hooklying B Horiz ABD Red TB 10x Hooklying B ER Red TB 10x Seated 45 dg Low Row Red TB 10x             PT Education - 11/29/15 1012    Education provided Yes   Education Details HEP - scapular retraction   Person(s) Educated Patient   Methods Explanation;Demonstration;Handout   Comprehension Verbalized understanding;Returned demonstration             PT Long Term Goals - 11/03/15 1225    PT LONG TERM GOAL #1   Title Independent with advanced HEP sa necessary by 12/13/15   Status On-going   PT LONG TERM GOAL #2   Title Neck AROM symmetric and WFL by 12/13/15   Status On-going   PT LONG TERM GOAL #3   Title pt able to perform ADLs, chores,  and drive without restriction by neck pain or LOM by 12/13/15   Status On-going               Plan - 11/29/15 1013    Clinical Impression Statement pt out of town since last treatment which involved 2 12 hour car rides. She reports noting increased neck pain while away on rainy days which she rated 6-7/10 but states felt good on sunny days. Today she reports feeling good stating only slight pain. Manual tolerance improved today and able to move through greater ROM.  Instructed and performed HEP for scapular retraction to assist with improved c-spine alignment and pain.   PT Next Visit Plan progress scapular retraction exercises as able; STM and TPR as tolerated to L neck and scpapular muscles; modalities to this area PRN (100% [place], e-stim, combo, dry needling, taping, etc)   Consulted and Agree with Plan of Care Patient      Patient will benefit from skilled therapeutic intervention in order to improve the following deficits and impairments:  Pain, Postural dysfunction,  Decreased range of motion  Visit Diagnosis: Cervicalgia     Problem List Patient Active Problem List   Diagnosis Date Noted  . Vasovagal reaction 07/15/2015  . Acute pharyngitis 07/15/2015  . History of colonic polyps 07/15/2015  . Medicare annual wellness visit, subsequent 02/01/2015  . Muscle strain 08/11/2014  . Hyperlipidemia 07/18/2014  . Fibrocystic breast 05/01/2014  . 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 Decreased visual acuity 01/23/2014  . HTN (hypertension) 01/23/2014  . Sun-damaged skin 01/23/2014  . Hot flashes 01/23/2014  . Recurrent BCC (basal cell carcinoma) 10/13/2013  . Vitamin D deficiency 10/13/2013  . Pain in joint, ankle and foot 10/13/2013  . Cancer ([place])   . Cataract   . EBV infection   . Overweight     Kaylib Furness PT, OCS 11/29/2015, 10:17 AM  [facility] [address]  [place], [place], [address] Phone: [phone]   Fax:  [phone]  Name: Laura [name] MRN: [id number] Date of Birth: [birth year]

## 2015-12-06 ENCOUNTER — Ambulatory Visit: Payer: Medicare Other | Admitting: Physical Therapy

## 2015-12-06 DIAGNOSIS — M542 Cervicalgia: Secondary | ICD-10-CM | POA: Diagnosis not present

## 2015-12-06 NOTE — Therapy (Signed)
[facility] [address]  [place], [place], [address] Phone: [phone]   Fax:  [phone]  Physical Therapy Treatment  Patient Details  Name: Laura [name] MRN: [id number] Date of Birth: 1941/06/01 Referring Provider: Ashok Pall, MD  Encounter Date: 12/06/2015      PT End of Session - 12/06/15 1106    Visit Number 7   Number of Visits 12   Date for PT Re-Evaluation 12/13/15   PT Start Time 1105   PT Stop Time 1144   PT Time Calculation (min) 39 min      Past Medical History  Diagnosis Date  . Chicken pox as a child  . Measles as a child  . Mumps as a child  . Hypertension 20 yrs ago  . Cancer ([place])     basal on back and nose  . Cataract   . 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 Hyperlipidemia   . Overweight(278.02)   . Other and unspecified hyperlipidemia 10/13/2013  . Fibrocystic breast 05/01/2014  . Medicare annual wellness visit, subsequent 02/01/2015  . Vasovagal reaction 07/15/2015    no syncope but close   . GERD (gastroesophageal reflux disease)     pt denies    Past Surgical History  Procedure Laterality Date  . Vaginal hysterectomy  75 yrs old    uterus removed  . Eye surgery  11-27-2010    cataract surgery in both eyes  . 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 Abdominal hysterectomy  29 yrs ago    heavy bleeding, ovaries left in place  . Colonoscopy    . Polypectomy      There were no vitals filed for this visit.      Subjective Assessment - 12/06/15 1154    Subjective pt states feels tight today due to stress (related to husband's heart issue)   Currently in Pain? Yes   Pain Score 2    Pain Location Neck   Pain Orientation Left             TODAY'S TREATMENT TherEx - UBE lvl 1.0 60"/60" Low Row 20# 10x Hooklying B ER Red TB 15x Hooklying B Horiz ABD Red TB 15x Hooklying UE Flexion with Contra Ext Green TB 10x  each  Manual - Supine - grade 3 MFR B UT, grade 2 L 1st rib caudal glides; TPR L medial UT; grade 2 c-spine B side-bending and lower c-spine side-glide mobs; B Rotation mobs grade 2 and 3                        PT Long Term Goals - 11/03/15 1225    PT LONG TERM GOAL #1   Title Independent with advanced HEP sa necessary by 12/13/15   Status On-going   PT LONG TERM GOAL #2   Title Neck AROM symmetric and WFL by 12/13/15   Status On-going   PT LONG TERM GOAL #3   Title pt able to perform ADLs, chores, and drive without restriction by neck pain or LOM by 12/13/15   Status On-going               Plan - 12/06/15 1155    Clinical Impression Statement pt with c/o increased stress and tightness related to this due to health issues with her husband.  Some progression with scapular exercises well tolerated today.  PT Next Visit Plan progress scapular retraction exercises as able; STM and TPR as tolerated to L neck and scpapular muscles; modalities to this area PRN (100% [place], e-stim, combo, dry needling, taping, etc)   Consulted and Agree with Plan of Care Patient      Patient will benefit from skilled therapeutic intervention in order to improve the following deficits and impairments:     Visit Diagnosis: Cervicalgia     Problem List Patient Active Problem List   Diagnosis Date Noted  . Vasovagal reaction 07/15/2015  . Acute pharyngitis 07/15/2015  . History of colonic polyps 07/15/2015  . Medicare annual wellness visit, subsequent 02/01/2015  . Muscle strain 08/11/2014  . Hyperlipidemia 07/18/2014  . Fibrocystic breast 05/01/2014  . 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 Decreased visual acuity 01/23/2014  . HTN (hypertension) 01/23/2014  . Sun-damaged skin 01/23/2014  . Hot flashes 01/23/2014  . Recurrent BCC (basal cell carcinoma) 10/13/2013  . Vitamin D deficiency 10/13/2013  . Pain in joint, ankle and foot 10/13/2013  . Cancer ([place])   . Cataract   . EBV  infection   . Overweight     Tauni Sanks PT, OCS 12/06/2015, 11:56 AM  [facility] [address]  [place], [place], [address] Phone: [phone]   Fax:  [phone]  Name: Laura [name] MRN: [id number] Date of Birth: 08-Aug-1940

## 2015-12-08 ENCOUNTER — Ambulatory Visit: Payer: Medicare Other | Admitting: Physical Therapy

## 2015-12-08 DIAGNOSIS — M542 Cervicalgia: Secondary | ICD-10-CM

## 2015-12-08 NOTE — Therapy (Signed)
[facility] [address]  [place], [place], [address] Phone: [phone]   Fax:  [phone]  Physical Therapy Treatment  Patient Details  Name: Laura [name] MRN: [id number] Date of Birth: 1941-06-28 Referring Provider: Ashok Pall, MD  Encounter Date: 12/08/2015      PT End of Session - 12/08/15 0803    Visit Number 8   Number of Visits 12   PT Start Time 0802      Past Medical History  Diagnosis Date  . Chicken pox as a child  . Measles as a child  . Mumps as a child  . Hypertension 20 yrs ago  . Cancer ([place])     basal on back and nose  . Cataract   . 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 Hyperlipidemia   . Overweight(278.02)   . Other and unspecified hyperlipidemia 10/13/2013  . Fibrocystic breast 05/01/2014  . Medicare annual wellness visit, subsequent 02/01/2015  . Vasovagal reaction 07/15/2015    no syncope but close   . GERD (gastroesophageal reflux disease)     pt denies    Past Surgical History  Procedure Laterality Date  . Vaginal hysterectomy  75 yrs old    uterus removed  . Eye surgery  11-27-2010    cataract surgery in both eyes  . 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 Abdominal hysterectomy  29 yrs ago    heavy bleeding, ovaries left in place  . Colonoscopy    . Polypectomy      There were no vitals filed for this visit.      Subjective Assessment - 12/08/15 0803    Subjective States noted increased pain beginning yesterday due to changing weather.     Currently in Pain? Yes   Pain Score 5    Pain Location Neck   Pain Orientation Left           TODAY'S TREATMENT IFC (80-150Hz ) with MHP to c-spine with pt seated 15'  Manual - Supine - grade 3 MFR B UT, grade 2 L 1st rib caudal glides, TPR and STM to L UT (very tender with referred pain present)               PT Long Term Goals - 11/03/15 1225    PT LONG TERM  GOAL #1   Title Independent with advanced HEP sa necessary by 12/13/15   Status On-going   PT LONG TERM GOAL #2   Title Neck AROM symmetric and WFL by 12/13/15   Status On-going   PT LONG TERM GOAL #3   Title pt able to perform ADLs, chores, and drive without restriction by neck pain or LOM by 12/13/15   Status On-going               Plan - 12/08/15 0818    Clinical Impression Statement Increased soreness in neck past couple days which pt believes is due to rainy weather.  Today's treatment therefore focused on reduced pain and improved mobility for upcomming weekend.  Large and very tender TP noted in L UT which produced pain extending into neck and scalp with palpation.  Some benefit noted with manual to this area, will address more going forward.   PT Next Visit Plan progress scapular retraction exercises as able; STM and TPR as tolerated to L neck and scpapular muscles; modalities to this area PRN (100% [place],  e-stim, combo, dry needling, taping, etc)   Consulted and Agree with Plan of Care Patient      Patient will benefit from skilled therapeutic intervention in order to improve the following deficits and impairments:  Pain, Postural dysfunction, Decreased range of motion  Visit Diagnosis: Cervicalgia     Problem List Patient Active Problem List   Diagnosis Date Noted  . Vasovagal reaction 07/15/2015  . Acute pharyngitis 07/15/2015  . History of colonic polyps 07/15/2015  . Medicare annual wellness visit, subsequent 02/01/2015  . Muscle strain 08/11/2014  . Hyperlipidemia 07/18/2014  . Fibrocystic breast 05/01/2014  . 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 Decreased visual acuity 01/23/2014  . HTN (hypertension) 01/23/2014  . Sun-damaged skin 01/23/2014  . Hot flashes 01/23/2014  . Recurrent BCC (basal cell carcinoma) 10/13/2013  . Vitamin D deficiency 10/13/2013  . Pain in joint, ankle and foot 10/13/2013  . Cancer ([place])   . Cataract   . EBV infection   . Overweight      Laura [name] PT, OCS 12/08/2015, 9:08 AM  [facility] [address]  [place], [place], [address] Phone: [phone]   Fax:  [phone]  Name: Laura [name] MRN: [id number] Date of Birth: 07-22-1941

## 2015-12-12 ENCOUNTER — Ambulatory Visit: Payer: Medicare Other | Admitting: Physical Therapy

## 2015-12-14 ENCOUNTER — Ambulatory Visit: Payer: Medicare Other | Admitting: Physical Therapy

## 2015-12-14 DIAGNOSIS — M542 Cervicalgia: Secondary | ICD-10-CM | POA: Diagnosis not present

## 2015-12-14 NOTE — Therapy (Signed)
[facility] [address]  [place], [place], [address] Phone: [phone]   Fax:  [phone]  Physical Therapy Treatment  Patient Details  Name: Laura [name] MRN: [id number] Date of Birth: 03/17/1941 Referring Provider: Ashok Pall, MD  Encounter Date: 12/14/2015      PT End of Session - 12/14/15 1322    Visit Number 9   Number of Visits 12   Date for PT Re-Evaluation 12/28/15   PT Start Time [id number]   PT Stop Time 1418   PT Time Calculation (min) 57 min      Past Medical History  Diagnosis Date  . Chicken pox as a child  . Measles as a child  . Mumps as a child  . Hypertension 20 yrs ago  . Cancer ([place])     basal on back and nose  . Cataract   . 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 Hyperlipidemia   . Overweight(278.02)   . Other and unspecified hyperlipidemia 10/13/2013  . Fibrocystic breast 05/01/2014  . Medicare annual wellness visit, subsequent 02/01/2015  . Vasovagal reaction 07/15/2015    no syncope but close   . GERD (gastroesophageal reflux disease)     pt denies    Past Surgical History  Procedure Laterality Date  . Vaginal hysterectomy  75 yrs old    uterus removed  . Eye surgery  11-27-2010    cataract surgery in both eyes  . 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 Abdominal hysterectomy  29 yrs ago    heavy bleeding, ovaries left in place  . Colonoscopy    . Polypectomy      There were no vitals filed for this visit.      Subjective Assessment - 12/14/15 1323    Subjective States under a great deal of stress due to finding out husband is to undergo quadruple by-pass tomorrow and due to this she is noting more neck pain and stiffness and having difficulty sleeping.   Currently in Pain? Yes   Pain Score --  5-6/10   Pain Location Neck   Pain Orientation Left            [facility] PT Assessment - 12/14/15 0001    AROM   Cervical  Extension 57   Cervical - Right Side Bend 20   Cervical - Left Side Bend 10  L neck pain   Cervical - Right Rotation 53   Cervical - Left Rotation 60         TODAY'S TREATMENT Manual - Supine - grade 3 MFR B UT, grade 2 and 3 L 1st rib caudal glides, TPR and STM to L UT (very tender with referred pain present)  IFC (80-150Hz ) with MHP to c-spine with pt supine 15'          PT Long Term Goals - 12/14/15 1412    PT LONG TERM GOAL #1   Title Independent with advanced HEP as necessary by 12/28/15  instructed and is performing current HEP, may progress further   Status On-going   PT LONG TERM GOAL #2   Title Neck AROM symmetric and WFL by 12/28/15  is improving into R rotation and R side-bending; L rotation and extension are WFL; L side-bending is still restricted to 10dg.   Status On-going   PT LONG TERM GOAL #3   Title pt able to perform ADLs, chores, and  drive without restriction by neck pain or LOM by 12/28/15   Status On-going               Plan - 12/14/15 1408    Clinical Impression Statement Increased stress with husband finding out requires CABG has contributed to increased neck pain and tightness lately.  Rates pain 5-6/10 (was down to 1-2/10 last week).  Treatment therefore focused on manual and modalities today for pain and relaxation.  Neck AROM re-assessment finds improvement in all planes other than her most restricted motion of Left side-bending which is still limited to 10 degrees with c/o L neck pain. She has 3 visits remaining on initial POC which was for 12 visits in a 6 week period.  The 6 weeks have elapsed and she wasn't able to attend all 12 visits.  I would therefore like to extend her POC up to 2 more weeks to allow completion of the 12 visits.   PT Frequency --  1-2x/wk   PT Duration 2 weeks   PT Treatment/Interventions Manual techniques;Therapeutic exercise;Moist Heat;Electrical Stimulation;Ultrasound   PT Next Visit Plan progress scapular  retraction exercises as able; STM and TPR as tolerated to L neck and scpapular muscles; continue with IFC PRN   Consulted and Agree with Plan of Care Patient      Patient will benefit from skilled therapeutic intervention in order to improve the following deficits and impairments:  Pain, Postural dysfunction, Decreased range of motion  Visit Diagnosis: Cervicalgia     Problem List Patient Active Problem List   Diagnosis Date Noted  . Vasovagal reaction 07/15/2015  . Acute pharyngitis 07/15/2015  . History of colonic polyps 07/15/2015  . Medicare annual wellness visit, subsequent 02/01/2015  . Muscle strain 08/11/2014  . Hyperlipidemia 07/18/2014  . Fibrocystic breast 05/01/2014  . 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 Decreased visual acuity 01/23/2014  . HTN (hypertension) 01/23/2014  . Sun-damaged skin 01/23/2014  . Hot flashes 01/23/2014  . Recurrent BCC (basal cell carcinoma) 10/13/2013  . Vitamin D deficiency 10/13/2013  . Pain in joint, ankle and foot 10/13/2013  . Cancer ([place])   . Cataract   . EBV infection   . Overweight     Laura [name] PT, OCS 12/14/2015, 6:30 PM  [facility] [address]  [place], [place], [address] Phone: [phone]   Fax:  [phone]  Name: Laura [name] MRN: [id number] Date of Birth: 1940-07-30

## 2015-12-19 ENCOUNTER — Ambulatory Visit: Payer: Medicare Other | Admitting: Physical Therapy

## 2015-12-22 ENCOUNTER — Ambulatory Visit: Payer: Medicare Other | Admitting: Physical Therapy

## 2015-12-22 DIAGNOSIS — M542 Cervicalgia: Secondary | ICD-10-CM | POA: Diagnosis not present

## 2015-12-22 NOTE — Therapy (Signed)
[facility] [address]  [place], [place], [address] Phone: [phone]   Fax:  [phone]  Physical Therapy Treatment  Patient Details  Name: Narely Nobles MRN: [id number] Date of Birth: 06/24/1941 Referring Provider: Ashok Pall, MD  Encounter Date: 12/22/2015      PT End of Session - 12/22/15 0845    Visit Number 10   Number of Visits 12   Date for PT Re-Evaluation 12/28/15   PT Start Time 0845   PT Stop Time 0928   PT Time Calculation (min) 43 min      Past Medical History  Diagnosis Date  . Chicken pox as a child  . Measles as a child  . Mumps as a child  . Hypertension 20 yrs ago  . Cancer ([place])     basal on back and nose  . Cataract   . 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 Hyperlipidemia   . Overweight(278.02)   . Other and unspecified hyperlipidemia 10/13/2013  . Fibrocystic breast 05/01/2014  . Medicare annual wellness visit, subsequent 02/01/2015  . Vasovagal reaction 07/15/2015    no syncope but close   . GERD (gastroesophageal reflux disease)     pt denies    Past Surgical History  Procedure Laterality Date  . Vaginal hysterectomy  75 yrs old    uterus removed  . Eye surgery  11-27-2010    cataract surgery in both eyes  . 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 Abdominal hysterectomy  29 yrs ago    heavy bleeding, ovaries left in place  . Colonoscopy    . Polypectomy      There were no vitals filed for this visit.      Subjective Assessment - 12/22/15 0851    Subjective States neck is doing okay, states understands it's something she'll have to live with.  Her husband underwent CABG x6 last week and she has been under great deal of stress and is tired / fatigued feeling lately.   Currently in Pain? Yes   Pain Score --  only noted with movement which she rates 4-5/10 lately   Pain Location Neck   Pain Orientation Left   Aggravating  Factors  Neck AROM            [facility] PT Assessment - 12/22/15 0001    AROM   Cervical Extension 57   Cervical - Right Side Bend 20   Cervical - Left Side Bend 10  L neck pain   Cervical - Right Rotation 53   Cervical - Left Rotation 60          TODAY'S TREATMENT Manual - Supine - grade 3 MFR B UT, grade 2 and 3 L 1st rib caudal glides, TPR and STM to L UT  TherEx - Low Row 20# 10x Standing FR along spine B ER Red 12x Standing FR along spine B Hooklying B Horiz ABD Red TB 12x Standing FR along spine Shoulder Flex with Contra Ext Red TB 5x each              PT Long Term Goals - 12/22/15 1147    PT LONG TERM GOAL #1   Title Independent with advanced HEP as necessary by 12/28/15   Status Achieved   PT LONG TERM GOAL #2   Title Neck AROM symmetric and WFL by 12/28/15  WFL most planes but still  restricted L side-bending and R rotation   Status Partially Met   PT LONG TERM GOAL #3   Title pt able to perform ADLs, chores, and drive without restriction by neck pain or LOM by 12/28/15  intermittent limitations   Status Partially Met               Plan - 01/19/16 1141    Clinical Impression Statement Mrs. Shadd reports noting benefit with regard to neck pain with OPPT intervention but also acknowledges that some pain is going to persist.  She states she has been feeling rather good lately despite being under great deal of stress stating really only notes pain with neck rotation AROM which she rates up to 4-5/10.  Her Neck AROM has improved and she is independent with HEP which will hopefully help limit symptoms from worsening.  She is being dishcarged from PT today due to being pleased with current level of function.    PT Next Visit Plan discharged from [facility] today   Consulted and Agree with Plan of Care Patient      Visit Diagnosis: Cervicalgia       G-Codes - 2016/01/19 0931    Functional Assessment Tool Used foto 19% limitation (improved from 30% at  initial eval)   Functional Limitation Carrying, moving and handling objects   Carrying, Moving and Handling Objects Current Status (I3254) At least 1 percent but less than 20 percent impaired, limited or restricted   Carrying, Moving and Handling Objects Goal Status (D8264) At least 20 percent but less than 40 percent impaired, limited or restricted   Carrying, Moving and Handling Objects Discharge Status [id number]) At least 1 percent but less than 20 percent impaired, limited or restricted       [facility] PT, OCS 01/19/2016, 11:50 AM  [facility] [place], [place], [address] Phone: [phone]   Fax:  [phone]  Name: Tyreisha Ungar MRN: [id number] Date of Birth: [birth year]   [place]  Visits from Start of Care: 10  Current functional level related to goals / functional outcomes: Good progress overall but still with some restrictions into Left Side-bending and Right Rotation.  She is pleased with progress and requests discharge at this time.   Remaining deficits: Intermittent neck pain and mild stiffness/LOM   Education / Equipment: HEP, posture education Plan: Patient agrees to discharge.  Patient goals were partially met. Patient is being discharged due to being pleased with the current functional level.  ?????        Leonette Most PT, OCS 2016/01/19 11:52 AM

## 2015-12-28 ENCOUNTER — Telehealth: Payer: Self-pay | Admitting: *Deleted

## 2015-12-28 ENCOUNTER — Other Ambulatory Visit: Payer: Self-pay | Admitting: Family Medicine

## 2015-12-28 DIAGNOSIS — E559 Vitamin D deficiency, unspecified: Secondary | ICD-10-CM

## 2015-12-28 DIAGNOSIS — I10 Essential (primary) hypertension: Secondary | ICD-10-CM

## 2015-12-28 DIAGNOSIS — E785 Hyperlipidemia, unspecified: Secondary | ICD-10-CM

## 2015-12-28 NOTE — Telephone Encounter (Signed)
Lab ordered/appt. Scheduled for 6/14.  Patient aware

## 2015-12-28 NOTE — Telephone Encounter (Signed)
Sure order labs lipid, cmp, cbc, tsh, vit d def for HTN, hyperlipidemia and vitamin d def.

## 2015-12-28 NOTE — Telephone Encounter (Signed)
Spoke w/ pt about her husband. During call, she mentioned that she has CPE 01/19/16 and thought she was supposed to come in ahead of time for labs. She has no lab appt scheduled. Please advise pt if she needs to schedule lab appt prior to CPE.

## 2016-01-09 ENCOUNTER — Ambulatory Visit: Payer: Medicare Other | Admitting: Family Medicine

## 2016-01-10 ENCOUNTER — Other Ambulatory Visit (INDEPENDENT_AMBULATORY_CARE_PROVIDER_SITE_OTHER): Payer: Medicare Other

## 2016-01-10 DIAGNOSIS — E785 Hyperlipidemia, unspecified: Secondary | ICD-10-CM

## 2016-01-10 DIAGNOSIS — E559 Vitamin D deficiency, unspecified: Secondary | ICD-10-CM

## 2016-01-10 DIAGNOSIS — I10 Essential (primary) hypertension: Secondary | ICD-10-CM

## 2016-01-10 LAB — COMPREHENSIVE METABOLIC PANEL
ALT: 12 U/L (ref 0–35)
AST: 15 U/L (ref 0–37)
Albumin: 4 g/dL (ref 3.5–5.2)
Alkaline Phosphatase: 45 U/L (ref 39–117)
BUN: 24 mg/dL — ABNORMAL HIGH (ref 6–23)
CO2: 28 mEq/L (ref 19–32)
Calcium: 9.4 mg/dL (ref 8.4–10.5)
Chloride: 104 mEq/L (ref 96–112)
Creatinine, Ser: 0.9 mg/dL (ref 0.40–1.20)
GFR: 65.12 mL/min (ref >60.00)
Glucose, Bld: 88 mg/dL (ref 70–99)
Potassium: 4 mEq/L (ref 3.5–5.1)
Sodium: 140 mEq/L (ref 135–145)
Total Bilirubin: 0.4 mg/dL (ref 0.2–1.2)
Total Protein: 6.5 g/dL (ref 6.0–8.3)

## 2016-01-10 LAB — CBC WITH DIFFERENTIAL/PLATELET
Basophils Absolute: 0 10*3/uL (ref 0.0–0.1)
Basophils Relative: 0.7 % (ref 0.0–3.0)
Eosinophils Absolute: 0.2 10*3/uL (ref 0.0–0.7)
Eosinophils Relative: 3.4 % (ref 0.0–5.0)
HCT: 39.3 % (ref 36.0–46.0)
Hemoglobin: 13.4 g/dL (ref 12.0–15.0)
Lymphocytes Relative: 31.1 % (ref 12.0–46.0)
Lymphs Abs: 1.8 10*3/uL (ref 0.7–4.0)
MCHC: 34 g/dL (ref 30.0–36.0)
MCV: 85.9 fl (ref 78.0–100.0)
Monocytes Absolute: 0.5 10*3/uL (ref 0.1–1.0)
Monocytes Relative: 8 % (ref 3.0–12.0)
Neutro Abs: 3.3 10*3/uL (ref 1.4–7.7)
Neutrophils Relative %: 56.8 % (ref 43.0–77.0)
Platelets: 262 10*3/uL (ref 150.0–400.0)
RBC: 4.58 Mil/uL (ref 3.87–5.11)
RDW: 13.9 % (ref 11.5–15.5)
WBC: 5.8 10*3/uL (ref 4.0–10.5)

## 2016-01-10 LAB — LIPID PANEL
Cholesterol: 235 mg/dL — ABNORMAL HIGH (ref 0–200)
HDL: 50.6 mg/dL (ref >39.00)
LDL Cholesterol: 146 mg/dL — ABNORMAL HIGH (ref 0–99)
NonHDL: 184.62
Total CHOL/HDL Ratio: 5
Triglycerides: 194 mg/dL — ABNORMAL HIGH (ref 0.0–149.0)
VLDL: 38.8 mg/dL (ref 0.0–40.0)

## 2016-01-10 LAB — VITAMIN D 25 HYDROXY (VIT D DEFICIENCY, FRACTURES): VITD: 29.55 ng/mL — ABNORMAL LOW (ref 30.00–100.00)

## 2016-01-10 LAB — TSH: TSH: 2.87 u[IU]/mL (ref 0.35–4.50)

## 2016-01-22 ENCOUNTER — Ambulatory Visit (INDEPENDENT_AMBULATORY_CARE_PROVIDER_SITE_OTHER): Payer: Medicare Other | Admitting: Family Medicine

## 2016-01-22 ENCOUNTER — Encounter: Payer: Self-pay | Admitting: Family Medicine

## 2016-01-22 VITALS — BP 124/88 | HR 83 | Temp 98.3°F | Ht 64.0 in

## 2016-01-22 DIAGNOSIS — E663 Overweight: Secondary | ICD-10-CM

## 2016-01-22 DIAGNOSIS — I10 Essential (primary) hypertension: Secondary | ICD-10-CM

## 2016-01-22 DIAGNOSIS — E559 Vitamin D deficiency, unspecified: Secondary | ICD-10-CM | POA: Diagnosis not present

## 2016-01-22 DIAGNOSIS — M542 Cervicalgia: Secondary | ICD-10-CM

## 2016-01-22 DIAGNOSIS — E785 Hyperlipidemia, unspecified: Secondary | ICD-10-CM | POA: Diagnosis not present

## 2016-01-22 HISTORY — DX: Cervicalgia: M54.2

## 2016-01-22 MED ORDER — LOSARTAN POTASSIUM-HCTZ 50-12.5 MG PO TABS: 1.0000 | ORAL_TABLET | Freq: Every day | ORAL | Status: DC

## 2016-01-22 NOTE — Assessment & Plan Note (Signed)
Well controlled, no changes to meds. Encouraged heart healthy diet such as the DASH diet and exercise as tolerated.  °

## 2016-01-22 NOTE — Progress Notes (Signed)
Pre visit review using our clinic review tool, if applicable. No additional management support is needed unless otherwise documented below in the visit note. 

## 2016-01-22 NOTE — Assessment & Plan Note (Signed)
Continue supplements and monitor 

## 2016-01-22 NOTE — Assessment & Plan Note (Addendum)
Does not tolerate statins. Consider Fenofibrates in the future

## 2016-01-22 NOTE — Assessment & Plan Note (Signed)
Encouraged DASH diet, decrease po intake and increase exercise as tolerated. Needs 7-8 hours of sleep nightly. Avoid trans fats, eat small, frequent meals every 4-5 hours with lean proteins, complex carbs and healthy fats. Minimize simple carbs 

## 2016-01-22 NOTE — Patient Instructions (Addendum)
Salon pas gel as needed. Patches antiinflammatory or lidocaine patches  DASH Eating Plan DASH stands for "Dietary Approaches to Stop Hypertension." The DASH eating plan is a healthy eating plan that has been shown to reduce high blood pressure (hypertension). Additional health benefits may include reducing the risk of type 2 diabetes mellitus, heart disease, and stroke. The DASH eating plan may also help with weight loss. WHAT DO I NEED TO KNOW ABOUT THE DASH EATING PLAN? For the DASH eating plan, you will follow these general guidelines:  Choose foods with a percent daily value for sodium of less than 5% (as listed on the food label).  Use salt-free seasonings or herbs instead of table salt or sea salt.  Check with your health care provider or pharmacist before using salt substitutes.  Eat lower-sodium products, often labeled as "lower sodium" or "no salt added."  Eat fresh foods.  Eat more vegetables, fruits, and low-fat dairy products.  Choose whole grains. Look for the word "whole" as the first word in the ingredient list.  Choose fish and skinless chicken or [place] more often than red meat. Limit fish, poultry, and meat to 6 oz (170 g) each day.  Limit sweets, desserts, sugars, and sugary drinks.  Choose heart-healthy fats.  Limit cheese to 1 oz (28 g) per day.  Eat more home-cooked food and less restaurant, buffet, and fast food.  Limit fried foods.  Cook foods using methods other than frying.  Limit canned vegetables. If you do use them, rinse them well to decrease the sodium.  When eating at a restaurant, ask that your food be prepared with less salt, or no salt if possible. WHAT FOODS CAN I EAT? Seek help from a dietitian for individual calorie needs. Grains Whole grain or whole wheat bread. Brown rice. Whole grain or whole wheat pasta. Quinoa, bulgur, and whole grain cereals. Low-sodium cereals. Corn or whole wheat flour tortillas. Whole grain cornbread. Whole grain  crackers. Low-sodium crackers. Vegetables Fresh or frozen vegetables (raw, steamed, roasted, or grilled). Low-sodium or reduced-sodium tomato and vegetable juices. Low-sodium or reduced-sodium tomato sauce and paste. Low-sodium or reduced-sodium canned vegetables.  Fruits All fresh, canned (in natural juice), or frozen fruits. Meat and Other Protein Products Ground beef (85% or leaner), grass-fed beef, or beef trimmed of fat. Skinless chicken or [place]. Ground chicken or [place]. Pork trimmed of fat. All fish and seafood. Eggs. Dried beans, peas, or lentils. Unsalted nuts and seeds. Unsalted canned beans. Dairy Low-fat dairy products, such as skim or 1% milk, 2% or reduced-fat cheeses, low-fat ricotta or cottage cheese, or plain low-fat yogurt. Low-sodium or reduced-sodium cheeses. Fats and Oils Tub margarines without trans fats. Light or reduced-fat mayonnaise and salad dressings (reduced sodium). Avocado. Safflower, olive, or canola oils. Natural peanut or almond butter. Other Unsalted popcorn and pretzels. The items listed above may not be a complete list of recommended foods or beverages. Contact your dietitian for more options. WHAT FOODS ARE NOT RECOMMENDED? Grains White bread. White pasta. White rice. Refined cornbread. Bagels and croissants. Crackers that contain trans fat. Vegetables Creamed or fried vegetables. Vegetables in a cheese sauce. Regular canned vegetables. Regular canned tomato sauce and paste. Regular tomato and vegetable juices. Fruits Dried fruits. Canned fruit in light or heavy syrup. Fruit juice. Meat and Other Protein Products Fatty cuts of meat. Ribs, chicken wings, bacon, sausage, bologna, salami, chitterlings, fatback, hot dogs, bratwurst, and packaged luncheon meats. Salted nuts and seeds. Canned beans with salt. Dairy Whole or 2%  milk, cream, half-and-half, and cream cheese. Whole-fat or sweetened yogurt. Full-fat cheeses or blue cheese. Nondairy creamers and  whipped toppings. Processed cheese, cheese spreads, or cheese curds. Condiments Onion and garlic salt, seasoned salt, table salt, and sea salt. Canned and packaged gravies. Worcestershire sauce. Tartar sauce. Barbecue sauce. Teriyaki sauce. Soy sauce, including reduced sodium. Steak sauce. Fish sauce. Oyster sauce. Cocktail sauce. Horseradish. Ketchup and mustard. Meat flavorings and tenderizers. Bouillon cubes. Hot sauce. Tabasco sauce. Marinades. Taco seasonings. Relishes. Fats and Oils Butter, stick margarine, lard, shortening, ghee, and bacon fat. Coconut, palm kernel, or palm oils. Regular salad dressings. Other Pickles and olives. Salted popcorn and pretzels. The items listed above may not be a complete list of foods and beverages to avoid. Contact your dietitian for more information. WHERE CAN I FIND MORE INFORMATION? National Heart, Lung, and Blood Institute: travelstabloid.com   This information is not intended to replace advice given to you by your health care provider. Make sure you discuss any questions you have with your health care provider.   Document Released: 07/04/2011 Document Revised: 08/05/2014 Document Reviewed: 05/19/2013 Elsevier Interactive Patient Education [employer].

## 2016-01-22 NOTE — Assessment & Plan Note (Signed)
Reviewed MRI, Xray and patient visit with neurosurgery. She understands that in her current state she is not a surgical candidate. She prefers to avoid excessive meds. Encouraged moist heat, gentle stretching and topical treatments. May try Acupuncture.

## 2016-01-22 NOTE — Progress Notes (Signed)
Patient ID: Laura [name], female   DOB: 15-Feb-1941, 75 y.o.   MRN: [id number]   Subjective:    Patient ID: Laura [name], female    DOB: 1941-02-28, 75 y.o.   MRN: [id number]  Chief Complaint  Patient presents with  . Follow-up    HPI Patient is in today for follow up. Her ongoing concern is her neck pain. She has met with neurosurgery and accepts that she is not a surgical candidate but she is struggling with daily pain. No recent illness or other acute concerns. Denies CP/palp/SOB/HA/congestion/fevers/GI or GU c/o. Taking meds as prescribed  Past Medical History  Diagnosis Date  . Chicken pox as a child  . Measles as a child  . Mumps as a child  . Hypertension 20 yrs ago  . Cancer ([place])     basal on back and nose  . Cataract   . 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 Hyperlipidemia   . Overweight(278.02)   . Other and unspecified hyperlipidemia 10/13/2013  . Fibrocystic breast 05/01/2014  . Medicare annual wellness visit, subsequent 02/01/2015  . Vasovagal reaction 07/15/2015    no syncope but close   . GERD (gastroesophageal reflux disease)     pt denies  . Neck pain 01/22/2016    Past Surgical History  Procedure Laterality Date  . Vaginal hysterectomy  75 yrs old    uterus removed  . Eye surgery  11-27-2010    cataract surgery in both eyes  . 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 Abdominal hysterectomy  30 yrs ago    heavy bleeding, ovaries left in place  . Colonoscopy    . Polypectomy      Family History  Problem Relation Age of Onset  . Heart disease Father   . Heart attack Father 67  . Hypertension Father   . Dementia Maternal Grandmother   . Heart disease Maternal Grandfather   . Hyperlipidemia Paternal Grandmother   . Cancer Paternal Grandfather     stomach  . Heart attack Paternal Grandfather   . Stomach cancer Paternal Grandfather   . Dementia Mother   . Pulmonary embolism Son   . Colon cancer Neg Hx   .  Colon polyps Neg Hx   . Rectal cancer Neg Hx     Social History   Social History  . Marital Status: Married    Spouse Name: N/A  . Number of Children: N/A  . Years of Education: N/A   Occupational History  . Not on file.   Social History Main Topics  . Smoking status: Former Smoker -- 1.00 packs/day for 16 years    Types: Cigarettes    Start date: 07/29/1972  . Smokeless tobacco: Never Used  . Alcohol Use: 0.0 oz/week    0 Standard drinks or equivalent per week     Comment: special occasions gllass of wine  . Drug Use: No  . Sexual Activity: Yes     Comment: lives with husband no major dietary restrictions   Other Topics Concern  . Not on file   Social History Narrative    Outpatient Prescriptions Prior to Visit  Medication Sig Dispense Refill  . clindamycin (CLEOCIN T) 1 % lotion Apply 30 application topically as needed. Reported on 09/05/2015  0  . diazepam (VALIUM) 10 MG tablet Take 1/2 to 1 tablet before MRI for anxiety. 5 tablet 0  . Fiber, Guar Gum, CHEW Chew by  mouth as needed.    . Multiple Vitamins-Minerals (MULTI-VITAMIN GUMMIES) CHEW Chew 1 tablet by mouth daily.    . cholecalciferol (VITAMIN D) 1000 units tablet Take 1,000 Units by mouth daily.    Marland Kitchen losartan-hydrochlorothiazide (HYZAAR) 50-12.5 MG per tablet Take 1 tablet by mouth daily. 90 tablet 2  . meloxicam (MOBIC) 7.5 MG tablet TAKE 1 TABLET (7.5 MG TOTAL) BY MOUTH DAILY AS NEEDED FOR PAIN (WITH FOOD). 30 tablet 0  . methocarbamol (ROBAXIN) 500 MG tablet Take 1 tablet (500 mg total) by mouth at bedtime as needed for muscle spasms. 30 tablet 0   No facility-administered medications prior to visit.    Allergies  Allergen Reactions  . Lisinopril     dizzy  . Statins     Muscle soreness   . Sulfa Antibiotics Other (See Comments)    "feels feverish"  . Tetracyclines & Related     GI upset    Review of Systems  Constitutional: Negative for fever and malaise/fatigue.  HENT: Negative for  congestion.   Eyes: Negative for blurred vision.  Respiratory: Negative for shortness of breath.   Cardiovascular: Negative for chest pain, palpitations and leg swelling.  Gastrointestinal: Negative for nausea, abdominal pain and blood in stool.  Genitourinary: Negative for dysuria and frequency.  Musculoskeletal: Positive for neck pain. Negative for falls.  Skin: Negative for rash.  Neurological: Negative for dizziness, loss of consciousness and headaches.  Endo/Heme/Allergies: Negative for environmental allergies.  Psychiatric/Behavioral: Negative for depression. The patient is not nervous/anxious.        Objective:    Physical Exam  Constitutional: She is oriented to person, place, and time. She appears well-developed and well-nourished. No distress.  HENT:  Head: Normocephalic and atraumatic.  Nose: Nose normal.  Eyes: Right eye exhibits no discharge. Left eye exhibits no discharge.  Neck: Normal range of motion. Neck supple.  Cardiovascular: Normal rate and regular rhythm.   No murmur heard. Pulmonary/Chest: Effort normal and breath sounds normal.  Abdominal: Soft. Bowel sounds are normal. There is no tenderness.  Musculoskeletal: She exhibits no edema.  Neurological: She is alert and oriented to person, place, and time.  Skin: Skin is warm and dry.  Psychiatric: She has a normal mood and affect.  Nursing note and vitals reviewed.   BP 124/88 mmHg  Pulse 83  Temp(Src) 98.3 F (36.8 C) (Oral)  Ht _0  (1.626 m)  Wt   SpO2 94% Wt Readings from Last 3 Encounters:  09/19/15 173 lb (78.472 kg)  09/05/15 173 lb (78.472 kg)  07/10/15 168 lb (76.204 kg)     Lab Results  Component Value Date   WBC 5.8 01/10/2016   HGB 13.4 01/10/2016   HCT 39.3 01/10/2016   PLT 262.0 01/10/2016   GLUCOSE 88 01/10/2016   CHOL 235* 01/10/2016   TRIG 194.0* 01/10/2016   HDL 50.60 01/10/2016   LDLCALC 146* 01/10/2016   ALT 12 01/10/2016   AST 15 01/10/2016   NA 140 01/10/2016    K 4.0 01/10/2016   CL 104 01/10/2016   CREATININE 0.90 01/10/2016   BUN 24* 01/10/2016   CO2 28 01/10/2016   TSH 2.87 01/10/2016    Lab Results  Component Value Date   TSH 2.87 01/10/2016   Lab Results  Component Value Date   WBC 5.8 01/10/2016   HGB 13.4 01/10/2016   HCT 39.3 01/10/2016   MCV 85.9 01/10/2016   PLT 262.0 01/10/2016   Lab Results  Component Value Date  NA 140 01/10/2016   K 4.0 01/10/2016   CO2 28 01/10/2016   GLUCOSE 88 01/10/2016   BUN 24* 01/10/2016   CREATININE 0.90 01/10/2016   BILITOT 0.4 01/10/2016   ALKPHOS 45 01/10/2016   AST 15 01/10/2016   ALT 12 01/10/2016   PROT 6.5 01/10/2016   ALBUMIN 4.0 01/10/2016   CALCIUM 9.4 01/10/2016   GFR 65.12 01/10/2016   Lab Results  Component Value Date   CHOL 235* 01/10/2016   Lab Results  Component Value Date   HDL 50.60 01/10/2016   Lab Results  Component Value Date   LDLCALC 146* 01/10/2016   Lab Results  Component Value Date   TRIG 194.0* 01/10/2016   Lab Results  Component Value Date   CHOLHDL 5 01/10/2016   No results found for: HGBA1C     Assessment & Plan:   Problem List Items Addressed This Visit    Vitamin D deficiency    Continue supplements and monitor      Overweight    Encouraged DASH diet, decrease po intake and increase exercise as tolerated. Needs 7-8 hours of sleep nightly. Avoid trans fats, eat small, frequent meals every 4-5 hours with lean proteins, complex carbs and healthy fats. Minimize simple carbs      Neck pain    Reviewed MRI, Xray and patient visit with neurosurgery. She understands that in her current state she is not a surgical candidate. She prefers to avoid excessive meds. Encouraged moist heat, gentle stretching and topical treatments. May try Acupuncture.       Hyperlipidemia    Does not tolerate statins. Consider Fenofibrates in the future      Relevant Medications   losartan-hydrochlorothiazide (HYZAAR) 50-12.5 MG tablet   Other Relevant  Orders   Lipid panel   Comprehensive metabolic panel   CBC with Differential/Platelet   TSH   HTN (hypertension) - Primary    Well controlled, no changes to meds. Encouraged heart healthy diet such as the DASH diet and exercise as tolerated.       Relevant Medications   losartan-hydrochlorothiazide (HYZAAR) 50-12.5 MG tablet   Other Relevant Orders   Lipid panel   Comprehensive metabolic panel   CBC with Differential/Platelet   TSH      I have discontinued Ms. Maranan's cholecalciferol, methocarbamol, and meloxicam. I have also changed her losartan-hydrochlorothiazide. Additionally, I am having her maintain her Fiber (Guar Gum), MULTI-VITAMIN GUMMIES, clindamycin, and diazepam.  Meds ordered this encounter  Medications  . losartan-hydrochlorothiazide (HYZAAR) 50-12.5 MG tablet    Sig: Take 1 tablet by mouth daily.    Dispense:  90 tablet    Refill:  2    D/C PREVIOUS SCRIPTS FOR THIS MEDICATION     Penni Homans, MD

## 2016-02-13 DIAGNOSIS — M47892 Other spondylosis, cervical region: Secondary | ICD-10-CM | POA: Diagnosis not present

## 2016-02-13 DIAGNOSIS — R030 Elevated blood-pressure reading, without diagnosis of hypertension: Secondary | ICD-10-CM | POA: Diagnosis not present

## 2016-02-13 DIAGNOSIS — Z6826 Body mass index (BMI) 26.0-26.9, adult: Secondary | ICD-10-CM | POA: Diagnosis not present

## 2016-02-14 DIAGNOSIS — L820 Inflamed seborrheic keratosis: Secondary | ICD-10-CM | POA: Diagnosis not present

## 2016-02-14 DIAGNOSIS — L814 Other melanin hyperpigmentation: Secondary | ICD-10-CM | POA: Diagnosis not present

## 2016-02-24 ENCOUNTER — Other Ambulatory Visit: Payer: Self-pay | Admitting: Family Medicine

## 2016-03-26 DIAGNOSIS — M47892 Other spondylosis, cervical region: Secondary | ICD-10-CM | POA: Diagnosis not present

## 2016-03-26 DIAGNOSIS — M542 Cervicalgia: Secondary | ICD-10-CM | POA: Diagnosis not present

## 2016-03-26 DIAGNOSIS — Z6828 Body mass index (BMI) 28.0-28.9, adult: Secondary | ICD-10-CM | POA: Diagnosis not present

## 2016-03-27 ENCOUNTER — Other Ambulatory Visit: Payer: Self-pay | Admitting: Neurosurgery

## 2016-03-27 DIAGNOSIS — M47892 Other spondylosis, cervical region: Secondary | ICD-10-CM

## 2016-04-04 ENCOUNTER — Other Ambulatory Visit: Payer: Medicare Other

## 2016-04-10 DIAGNOSIS — L300 Nummular dermatitis: Secondary | ICD-10-CM | POA: Diagnosis not present

## 2016-04-11 DIAGNOSIS — H524 Presbyopia: Secondary | ICD-10-CM | POA: Diagnosis not present

## 2016-04-11 DIAGNOSIS — H5213 Myopia, bilateral: Secondary | ICD-10-CM | POA: Diagnosis not present

## 2016-04-11 DIAGNOSIS — G245 Blepharospasm: Secondary | ICD-10-CM | POA: Diagnosis not present

## 2016-04-11 DIAGNOSIS — H26493 Other secondary cataract, bilateral: Secondary | ICD-10-CM | POA: Diagnosis not present

## 2016-05-06 ENCOUNTER — Ambulatory Visit: Payer: Medicare Other | Admitting: Internal Medicine

## 2016-05-07 ENCOUNTER — Encounter: Payer: Self-pay | Admitting: Physician Assistant

## 2016-05-07 ENCOUNTER — Telehealth: Payer: Self-pay | Admitting: Family Medicine

## 2016-05-07 ENCOUNTER — Ambulatory Visit (INDEPENDENT_AMBULATORY_CARE_PROVIDER_SITE_OTHER): Payer: Medicare Other | Admitting: Physician Assistant

## 2016-05-07 VITALS — BP 125/84 | HR 89 | Temp 98.4°F | Resp 16 | Ht 64.0 in

## 2016-05-07 DIAGNOSIS — Z23 Encounter for immunization: Secondary | ICD-10-CM

## 2016-05-07 DIAGNOSIS — R300 Dysuria: Secondary | ICD-10-CM | POA: Diagnosis not present

## 2016-05-07 LAB — POCT URINALYSIS DIPSTICK
Bilirubin, UA: NEGATIVE
Glucose, UA: NEGATIVE
Ketones, UA: NEGATIVE
Nitrite, UA: NEGATIVE
Protein, UA: 0.15
Spec Grav, UA: 1.02
Urobilinogen, UA: NEGATIVE
pH, UA: 6

## 2016-05-07 MED ORDER — CEPHALEXIN 500 MG PO CAPS: 500.0000 mg | ORAL_CAPSULE | Freq: Four times a day (QID) | ORAL | 0 refills | Status: DC

## 2016-05-07 NOTE — Patient Instructions (Signed)
Your symptoms are consistent with a bladder infection, also called acute cystitis. Please take your antibiotic (Keflex) as directed until all pills are gone.  Stay very well hydrated.  Consider a daily probiotic (Align, Culturelle, or Activia) to help prevent stomach upset caused by the antibiotic.  Taking a probiotic daily may also help prevent recurrent UTIs.  Also consider taking AZO (Phenazopyridine) tablets to help decrease pain with urination.  I will call you with your urine testing results.  We will change antibiotics if indicated.  Call or return to clinic if symptoms are not resolved by completion of antibiotic.   Urinary Tract Infection A urinary tract infection (UTI) can occur any place along the urinary tract. The tract includes the kidneys, ureters, bladder, and urethra. A type of germ called bacteria often causes a UTI. UTIs are often helped with antibiotic medicine.  HOME CARE   If given, take antibiotics as told by your doctor. Finish them even if you start to feel better.  Drink enough fluids to keep your pee (urine) clear or pale yellow.  Avoid tea, drinks with caffeine, and bubbly (carbonated) drinks.  Pee often. Avoid holding your pee in for a long time.  Pee before and after having sex (intercourse).  Wipe from front to back after you poop (bowel movement) if you are a woman. Use each tissue only once. GET HELP RIGHT AWAY IF:   You have back pain.  You have lower belly (abdominal) pain.  You have chills.  You feel sick to your stomach (nauseous).  You throw up (vomit).  Your burning or discomfort with peeing does not go away.  You have a fever.  Your symptoms are not better in 3 days. MAKE SURE YOU:   Understand these instructions.  Will watch your condition.  Will get help right away if you are not doing well or get worse. Document Released: 01/01/2008 Document Revised: 04/08/2012 Document Reviewed: 02/13/2012 Exit[employer] Patient Information 2015  [employer]. This information is not intended to replace advice given to you by your health care provider. Make sure you discuss any questions you have with your health care provider.   

## 2016-05-07 NOTE — Telephone Encounter (Signed)
LMOM with contact name and number [for return call, if needed] RE: prescription error informed pt that Rx defaults to [4] xdaily and was an error on the ordering part but that she is to take only [1] twice daily for [7] days. Infored patient that if she did not pick up the medication to please call me back so that I can get a new Rx to pharmacy/SLS 10/10

## 2016-05-07 NOTE — Telephone Encounter (Signed)
Patient has questions regarding her cephALEXin (KEFLEX) 500 MG capsule. She states that she was told to take 2 per day and the prescription says 4 per day. Please advise.   Patient Relation: self Patient phone: [phone]

## 2016-05-07 NOTE — Addendum Note (Signed)
Addended by: Marjory Lies on: 05/07/2016 11:02 AM   Modules accepted: Orders

## 2016-05-07 NOTE — Progress Notes (Signed)
Pre visit review using our clinic review tool, if applicable. No additional management support is needed unless otherwise documented below in the visit note. 

## 2016-05-07 NOTE — Progress Notes (Signed)
Patient presents to clinic today c/o 2.5 days of suprapubic pressure with urinary frequency. Denies dark urine or foul-smelling urine. Denies flank pain, nausea, vomiting, fever or chills. Some mild dysuria noted. Notes pink tinge to urine yesterday but no gross hematuria noted.   Past Medical History:  Diagnosis Date  . Cancer ([place])    basal on back and nose  . Cataract   . Chicken pox as a child  . EBV infection    mono as child  . Fibrocystic breast 05/01/2014  . GERD (gastroesophageal reflux disease)    pt denies  . Hyperlipidemia   . Hypertension 20 yrs ago  . Measles as a child  . Medicare annual wellness visit, subsequent 02/01/2015  . Mumps as a child  . Neck pain 01/22/2016  . Other and unspecified hyperlipidemia 10/13/2013  . Overweight(278.02)   . Vasovagal reaction 07/15/2015   no syncope but close     Current Outpatient Prescriptions on File Prior to Visit  Medication Sig Dispense Refill  . clindamycin (CLEOCIN T) 1 % lotion Apply 30 application topically as needed. Reported on 09/05/2015  0  . Fiber, Guar Gum, CHEW Chew by mouth as needed.    Marland Kitchen losartan-hydrochlorothiazide (HYZAAR) 50-12.5 MG tablet TAKE 1 TABLET BY MOUTH EVERY DAY 90 tablet 2  . Multiple Vitamins-Minerals (MULTI-VITAMIN GUMMIES) CHEW Chew 1 tablet by mouth daily.    . diazepam (VALIUM) 10 MG tablet Take 1/2 to 1 tablet before MRI for anxiety. (Patient not taking: Reported on 05/07/2016) 5 tablet 0   No current facility-administered medications on file prior to visit.     Allergies  Allergen Reactions  . Lisinopril     dizzy  . Statins     Muscle soreness   . Sulfa Antibiotics Other (See Comments)    "feels feverish"  . Tetracyclines & Related     GI upset    Family History  Problem Relation Age of Onset  . Heart disease Father   . Heart attack Father 27  . Hypertension Father   . Dementia Maternal Grandmother   . Heart disease Maternal Grandfather   . Hyperlipidemia Paternal  Grandmother   . Cancer Paternal Grandfather     stomach  . Heart attack Paternal Grandfather   . Stomach cancer Paternal Grandfather   . Dementia Mother   . Pulmonary embolism Son   . Colon cancer Neg Hx   . Colon polyps Neg Hx   . Rectal cancer Neg Hx     Social History   Social History  . Marital status: Married    Spouse name: N/A  . Number of children: N/A  . Years of education: N/A   Social History Main Topics  . Smoking status: Former Smoker    Packs/day: 1.00    Years: 16.00    Types: Cigarettes    Start date: 07/29/1972  . Smokeless tobacco: Never Used  . Alcohol use 0.0 oz/week     Comment: special occasions gllass of wine  . Drug use: No  . Sexual activity: Yes     Comment: lives with husband no major dietary restrictions   Other Topics Concern  . Not on file   Social History Narrative  . No narrative on file   Review of Systems - See HPI.  All other ROS are negative.  BP 125/84 (BP Location: Right Arm, Patient Position: Sitting, Cuff Size: Normal)   Pulse 89   Temp 98.4 F (36.9 C) (Oral)   Resp  16   Ht 5\' 4"  (1.626 m)   SpO2 99%   Physical Exam  Constitutional: She is oriented to person, place, and time and well-developed, well-nourished, and in no distress.  HENT:  Head: Normocephalic and atraumatic.  Cardiovascular: Normal rate, regular rhythm, normal heart sounds and intact distal pulses.   Pulmonary/Chest: Effort normal and breath sounds normal. No respiratory distress. She has no wheezes. She has no rales. She exhibits no tenderness.  Abdominal: Soft. Bowel sounds are normal. She exhibits no distension. There is no tenderness.  Neurological: She is alert and oriented to person, place, and time.  Skin: Skin is warm and dry. No rash noted.  Psychiatric: Affect normal.  Vitals reviewed.  Assessment/Plan: 1. Dysuria Urine dip with + blood and LE. Will send for culture. Will start treatment with Keflex. Supportive measures reviewed. Will alter  regimen based on results. - POCT Urinalysis Dipstick    Leeanne Rio, PA-C

## 2016-05-07 NOTE — Telephone Encounter (Signed)
She is to take twice daily only. That is an error on the electronic prescription -- Take 1 tablet by mouth twice daily for 7 days.

## 2016-05-07 NOTE — Telephone Encounter (Signed)
Can you clarify please, as I haven't spoken with this patient/SLS

## 2016-05-09 LAB — URINE CULTURE: Colony Count: >=100000

## 2016-05-13 ENCOUNTER — Other Ambulatory Visit (INDEPENDENT_AMBULATORY_CARE_PROVIDER_SITE_OTHER): Payer: Medicare Other

## 2016-05-13 DIAGNOSIS — I10 Essential (primary) hypertension: Secondary | ICD-10-CM

## 2016-05-13 DIAGNOSIS — E785 Hyperlipidemia, unspecified: Secondary | ICD-10-CM

## 2016-05-13 LAB — COMPREHENSIVE METABOLIC PANEL
ALT: 10 U/L (ref 0–35)
AST: 13 U/L (ref 0–37)
Albumin: 4.3 g/dL (ref 3.5–5.2)
Alkaline Phosphatase: 42 U/L (ref 39–117)
BUN: 21 mg/dL (ref 6–23)
CO2: 27 mEq/L (ref 19–32)
Calcium: 9.5 mg/dL (ref 8.4–10.5)
Chloride: 104 mEq/L (ref 96–112)
Creatinine, Ser: 0.96 mg/dL (ref 0.40–1.20)
GFR: 60.39 mL/min (ref >60.00)
Glucose, Bld: 97 mg/dL (ref 70–99)
Potassium: 4 mEq/L (ref 3.5–5.1)
Sodium: 140 mEq/L (ref 135–145)
Total Bilirubin: 0.3 mg/dL (ref 0.2–1.2)
Total Protein: 6.8 g/dL (ref 6.0–8.3)

## 2016-05-13 LAB — LIPID PANEL
Cholesterol: 219 mg/dL — ABNORMAL HIGH (ref 0–200)
HDL: 43.7 mg/dL (ref >39.00)
LDL Cholesterol: 145 mg/dL — ABNORMAL HIGH (ref 0–99)
NonHDL: 175.77
Total CHOL/HDL Ratio: 5
Triglycerides: 154 mg/dL — ABNORMAL HIGH (ref 0.0–149.0)
VLDL: 30.8 mg/dL (ref 0.0–40.0)

## 2016-05-13 LAB — CBC WITH DIFFERENTIAL/PLATELET
Basophils Absolute: 0 10*3/uL (ref 0.0–0.1)
Basophils Relative: 0.6 % (ref 0.0–3.0)
Eosinophils Absolute: 0.1 10*3/uL (ref 0.0–0.7)
Eosinophils Relative: 2.1 % (ref 0.0–5.0)
HCT: 38.4 % (ref 36.0–46.0)
Hemoglobin: 13.1 g/dL (ref 12.0–15.0)
Lymphocytes Relative: 30.1 % (ref 12.0–46.0)
Lymphs Abs: 1.8 10*3/uL (ref 0.7–4.0)
MCHC: 34.2 g/dL (ref 30.0–36.0)
MCV: 87.4 fl (ref 78.0–100.0)
Monocytes Absolute: 0.5 10*3/uL (ref 0.1–1.0)
Monocytes Relative: 7.9 % (ref 3.0–12.0)
Neutro Abs: 3.6 10*3/uL (ref 1.4–7.7)
Neutrophils Relative %: 59.3 % (ref 43.0–77.0)
Platelets: 270 10*3/uL (ref 150.0–400.0)
RBC: 4.39 Mil/uL (ref 3.87–5.11)
RDW: 13.4 % (ref 11.5–15.5)
WBC: 6.1 10*3/uL (ref 4.0–10.5)

## 2016-05-13 LAB — TSH: TSH: 3.41 u[IU]/mL (ref 0.35–4.50)

## 2016-05-14 ENCOUNTER — Other Ambulatory Visit: Payer: Medicare Other

## 2016-05-20 ENCOUNTER — Telehealth: Payer: Self-pay | Admitting: Family Medicine

## 2016-05-20 NOTE — Telephone Encounter (Signed)
Noted  

## 2016-05-20 NOTE — Telephone Encounter (Signed)
Relation to [id number] Call back [phone]   Reason for call:  In response to appointment note (? AWV w/ Hoyle Sauer after PCP appt if pt agrees.) patient denied medicare wellness appointment.

## 2016-05-21 ENCOUNTER — Encounter: Payer: Self-pay | Admitting: Family Medicine

## 2016-05-21 ENCOUNTER — Ambulatory Visit (INDEPENDENT_AMBULATORY_CARE_PROVIDER_SITE_OTHER): Payer: Medicare Other | Admitting: Family Medicine

## 2016-05-21 VITALS — BP 120/80 | HR 71 | Temp 98.1°F | Ht 64.0 in | Wt 169.0 lb

## 2016-05-21 DIAGNOSIS — E559 Vitamin D deficiency, unspecified: Secondary | ICD-10-CM

## 2016-05-21 DIAGNOSIS — M542 Cervicalgia: Secondary | ICD-10-CM

## 2016-05-21 DIAGNOSIS — Z1239 Encounter for other screening for malignant neoplasm of breast: Secondary | ICD-10-CM

## 2016-05-21 DIAGNOSIS — E782 Mixed hyperlipidemia: Secondary | ICD-10-CM

## 2016-05-21 DIAGNOSIS — Z1231 Encounter for screening mammogram for malignant neoplasm of breast: Secondary | ICD-10-CM

## 2016-05-21 DIAGNOSIS — I10 Essential (primary) hypertension: Secondary | ICD-10-CM | POA: Diagnosis not present

## 2016-05-21 NOTE — Assessment & Plan Note (Signed)
Well controlled, no changes to meds. Encouraged heart healthy diet such as the DASH diet and exercise as tolerated.  °

## 2016-05-21 NOTE — Patient Instructions (Addendum)
Try adding Tylenol midday: Tylenol/Acetaminophen/APAP, max in 24 hours is 3000 mg.  Try Lidocaine gels or patches, made by Aspercreme, Icy Hot or Salon Pas  Probiotics for antibiotic use or illness. [place], 10 strain cap daily  Constipation, Adult Constipation is when a person has fewer than three bowel movements a week, has difficulty having a bowel movement, or has stools that are dry, hard, or larger than normal. As people grow older, constipation is more common. A low-fiber diet, not taking in enough fluids, and taking certain medicines may make constipation worse.  CAUSES   Certain medicines, such as antidepressants, pain medicine, iron supplements, antacids, and water pills.   Certain diseases, such as diabetes, irritable bowel syndrome (IBS), thyroid disease, or depression.   Not drinking enough water.   Not eating enough fiber-rich foods.   Stress or travel.   Lack of physical activity or exercise.   Ignoring the urge to have a bowel movement.   Using laxatives too much.  SIGNS AND SYMPTOMS   Having fewer than three bowel movements a week.   Straining to have a bowel movement.   Having stools that are hard, dry, or larger than normal.   Feeling full or bloated.   Pain in the lower abdomen.   Not feeling relief after having a bowel movement.  DIAGNOSIS  Your health care provider will take a medical history and perform a physical exam. Further testing may be done for severe constipation. Some tests may include:  A barium enema X-ray to examine your rectum, colon, and, sometimes, your small intestine.   A sigmoidoscopy to examine your lower colon.   A colonoscopy to examine your entire colon. TREATMENT  Treatment will depend on the severity of your constipation and what is causing it. Some dietary treatments include drinking more fluids and eating more fiber-rich foods. Lifestyle treatments may include regular exercise. If these diet and  lifestyle recommendations do not help, your health care provider may recommend taking over-the-counter laxative medicines to help you have bowel movements. Prescription medicines may be prescribed if over-the-counter medicines do not work.  HOME CARE INSTRUCTIONS   Eat foods that have a lot of fiber, such as fruits, vegetables, whole grains, and beans.  Limit foods high in fat and processed sugars, such as french fries, hamburgers, cookies, candies, and soda.   A fiber supplement may be added to your diet if you cannot get enough fiber from foods.   Drink enough fluids to keep your urine clear or pale yellow.   Exercise regularly or as directed by your health care provider.   Go to the restroom when you have the urge to go. Do not hold it.   Only take over-the-counter or prescription medicines as directed by your health care provider. Do not take other medicines for constipation without talking to your health care provider first.  [place] IF:   You have bright red blood in your stool.   Your constipation lasts for more than 4 days or gets worse.   You have abdominal or rectal pain.   You have thin, pencil-like stools.   You have unexplained weight loss. MAKE SURE YOU:   Understand these instructions.  Will watch your condition.  Will get help right away if you are not doing well or get worse.   This information is not intended to replace advice given to you by your health care provider. Make sure you discuss any questions you have with your health care  provider.   Document Released: 04/12/2004 Document Revised: 08/05/2014 Document Reviewed: 04/26/2013 Elsevier Interactive Patient Education [employer].

## 2016-05-21 NOTE — Assessment & Plan Note (Signed)
Numbers improved. Encouraged heart healthy diet, increase exercise, avoid trans fats, consider a krill oil cap daily

## 2016-05-21 NOTE — Progress Notes (Signed)
Pre visit review using our clinic review tool, if applicable. No additional management support is needed unless otherwise documented below in the visit note. 

## 2016-05-23 ENCOUNTER — Ambulatory Visit (HOSPITAL_BASED_OUTPATIENT_CLINIC_OR_DEPARTMENT_OTHER)
Admission: RE | Admit: 2016-05-23 | Discharge: 2016-05-23 | Disposition: A | Discharge status: Home / Self Care | Payer: Medicare Other | Source: Ambulatory Visit | Attending: Family Medicine | Admitting: Family Medicine

## 2016-05-23 DIAGNOSIS — Z1231 Encounter for screening mammogram for malignant neoplasm of breast: Secondary | ICD-10-CM | POA: Diagnosis not present

## 2016-05-23 DIAGNOSIS — Z1239 Encounter for other screening for malignant neoplasm of breast: Secondary | ICD-10-CM

## 2016-05-26 NOTE — Progress Notes (Signed)
Patient ID: Laura [name], female   DOB: 1940-08-01, 75 y.o.   MRN: [id number]   Subjective:    Patient ID: Laura [name], female    DOB: [birth year], 75 y.o.   MRN: [id number]  Chief Complaint  Patient presents with  . Follow-up    HPI Patient is in today for follow up. She needs a mammogram ordered but offers no breast complaints. She notes ongoing neck pain. Has followed iwht neurosurgery for quite some time. Pain is present dialy but not severe enough that she wants to pursue aggressive treatment. Denies CP/palp/SOB/HA/congestion/fevers/GI or GU c/o. Taking meds as prescribed  Past Medical History:  Diagnosis Date  . 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 Cataract   . Chicken pox as a child  . EBV infection    mono as child  . Fibrocystic breast 05/01/2014  . GERD (gastroesophageal reflux disease)    pt denies  . Hyperlipidemia   . Hypertension 20 yrs ago  . Measles as a child  . Medicare annual wellness visit, subsequent 02/01/2015  . Mumps as a child  . Neck pain 01/22/2016  . Other and unspecified hyperlipidemia 10/13/2013  . Overweight(278.02)   . Vasovagal reaction 07/15/2015   no syncope but close     Past Surgical History:  Procedure Laterality Date  . ABDOMINAL HYSTERECTOMY  29 yrs ago   heavy bleeding, ovaries left in place  . 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 COLONOSCOPY    . EYE SURGERY  11-27-2010   cataract surgery in both eyes  . POLYPECTOMY    . VAGINAL HYSTERECTOMY  75 yrs old   uterus removed    Family History  Problem Relation Age of Onset  . Heart disease Father   . Heart attack Father 67  . Hypertension Father   . Dementia Maternal Grandmother   . Heart disease Maternal Grandfather   . Hyperlipidemia Paternal Grandmother   . Cancer Paternal Grandfather     stomach  . Heart attack Paternal Grandfather   . Stomach cancer Paternal Grandfather   . Dementia Mother   . Pulmonary embolism Son     . Colon cancer Neg Hx   . Colon polyps Neg Hx   . Rectal cancer Neg Hx     Social History   Social History  . Marital status: Married    Spouse name: N/A  . Number of children: N/A  . Years of education: N/A   Occupational History  . Not on file.   Social History Main Topics  . Smoking status: Former Smoker    Packs/day: 1.00    Years: 16.00    Types: Cigarettes    Start date: 07/29/1972  . Smokeless tobacco: Never Used  . Alcohol use 0.0 oz/week     Comment: special occasions gllass of wine  . Drug use: No  . Sexual activity: Yes     Comment: lives with husband no major dietary restrictions   Other Topics Concern  . Not on file   Social History Narrative  . No narrative on file    Outpatient Medications Prior to Visit  Medication Sig Dispense Refill  . Fiber, Guar Gum, CHEW Chew by mouth as needed.    Marland Kitchen losartan-hydrochlorothiazide (HYZAAR) 50-12.5 MG tablet TAKE 1 TABLET BY MOUTH EVERY DAY 90 tablet 2  . Multiple Vitamins-Minerals (MULTI-VITAMIN GUMMIES) CHEW Chew 1 tablet by mouth daily.    . cephALEXin (KEFLEX) 500  MG capsule Take 1 capsule (500 mg total) by mouth 4 (four) times daily. 14 capsule 0  . clindamycin (CLEOCIN T) 1 % lotion Apply 30 application topically as needed. Reported on 09/05/2015  0  . diazepam (VALIUM) 10 MG tablet Take 1/2 to 1 tablet before MRI for anxiety. (Patient not taking: Reported on 05/07/2016) 5 tablet 0   No facility-administered medications prior to visit.     Allergies  Allergen Reactions  . Lisinopril     dizzy  . Statins     Muscle soreness   . Sulfa Antibiotics Other (See Comments)    "feels feverish"  . Tetracyclines & Related     GI upset    Review of Systems  Constitutional: Negative for fever and malaise/fatigue.  HENT: Negative for congestion.   Eyes: Negative for blurred vision.  Respiratory: Negative for shortness of breath.   Cardiovascular: Negative for chest pain, palpitations and leg swelling.   Gastrointestinal: Negative for abdominal pain, blood in stool and nausea.  Genitourinary: Negative for dysuria and frequency.  Musculoskeletal: Positive for neck pain. Negative for falls.  Skin: Negative for rash.  Neurological: Negative for dizziness, loss of consciousness and headaches.  Endo/Heme/Allergies: Negative for environmental allergies.  Psychiatric/Behavioral: Negative for depression. The patient is not nervous/anxious.        Objective:    Physical Exam  Constitutional: She is oriented to person, place, and time. She appears well-developed and well-nourished. No distress.  HENT:  Head: Normocephalic and atraumatic.  Nose: Nose normal.  Eyes: Right eye exhibits no discharge. Left eye exhibits no discharge.  Neck: Normal range of motion. Neck supple.  Cardiovascular: Normal rate and regular rhythm.   No murmur heard. Pulmonary/Chest: Effort normal and breath sounds normal.  Abdominal: Soft. Bowel sounds are normal. There is no tenderness.  Musculoskeletal: She exhibits no edema.  Neurological: She is alert and oriented to person, place, and time.  Skin: Skin is warm and dry.  Psychiatric: She has a normal mood and affect.  Nursing note and vitals reviewed.   BP 120/80 (BP Location: Left Arm, Patient Position: Sitting, Cuff Size: Normal)   Pulse 71   Temp 98.1 F (36.7 C) (Oral)   Ht 5\' 4"  (1.626 m)   Wt 169 lb (76.7 kg)   SpO2 97%   BMI 29.01 kg/m  Wt Readings from Last 3 Encounters:  05/21/16 169 lb (76.7 kg)  09/19/15 173 lb (78.5 kg)  09/05/15 173 lb (78.5 kg)     Lab Results  Component Value Date   WBC 6.1 05/13/2016   HGB 13.1 05/13/2016   HCT 38.4 05/13/2016   PLT 270.0 05/13/2016   GLUCOSE 97 05/13/2016   CHOL 219 (H) 05/13/2016   TRIG 154.0 (H) 05/13/2016   HDL 43.70 05/13/2016   LDLCALC 145 (H) 05/13/2016   ALT 10 05/13/2016   AST 13 05/13/2016   NA 140 05/13/2016   K 4.0 05/13/2016   CL 104 05/13/2016   CREATININE 0.96 05/13/2016    BUN 21 05/13/2016   CO2 27 05/13/2016   TSH 3.41 05/13/2016    Lab Results  Component Value Date   TSH 3.41 05/13/2016   Lab Results  Component Value Date   WBC 6.1 05/13/2016   HGB 13.1 05/13/2016   HCT 38.4 05/13/2016   MCV 87.4 05/13/2016   PLT 270.0 05/13/2016   Lab Results  Component Value Date   NA 140 05/13/2016   K 4.0 05/13/2016   CO2 27 05/13/2016  GLUCOSE 97 05/13/2016   BUN 21 05/13/2016   CREATININE 0.96 05/13/2016   BILITOT 0.3 05/13/2016   ALKPHOS 42 05/13/2016   AST 13 05/13/2016   ALT 10 05/13/2016   PROT 6.8 05/13/2016   ALBUMIN 4.3 05/13/2016   CALCIUM 9.5 05/13/2016   GFR 60.39 05/13/2016   Lab Results  Component Value Date   CHOL 219 (H) 05/13/2016   Lab Results  Component Value Date   HDL 43.70 05/13/2016   Lab Results  Component Value Date   LDLCALC 145 (H) 05/13/2016   Lab Results  Component Value Date   TRIG 154.0 (H) 05/13/2016   Lab Results  Component Value Date   CHOLHDL 5 05/13/2016   No results found for: HGBA1C     Assessment & Plan:   Problem List Items Addressed This Visit    Vitamin D deficiency    Continue daily supplements.       Relevant Orders   VITAMIN D 25 Hydroxy (Vit-D Deficiency, Fractures)   HTN (hypertension)    Well controlled, no changes to meds. Encouraged heart healthy diet such as the DASH diet and exercise as tolerated.       Relevant Orders   TSH   CBC   Comprehensive metabolic panel   Hyperlipidemia    Numbers improved. 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 She has followed by Dr Patrice Paradise of neurosurgery. Steroids injections in past have been helpful but pain is currently present daily. Encouraged to use topical treatments and stay active as tolerated.        Other Visit Diagnoses    Breast cancer screening    -  Primary   Relevant Orders   MM  SCREENING BREAST TOMO BILATERAL (Completed)      I have discontinued Ms. Pavlovic's clindamycin, diazepam, and cephALEXin. I am also having her maintain her Fiber (Guar Gum), MULTI-VITAMIN GUMMIES, and losartan-hydrochlorothiazide.  No orders of the defined types were placed in this encounter.    Penni Homans, MD

## 2016-05-26 NOTE — Assessment & Plan Note (Signed)
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 She has followed by Dr Patrice Paradise of neurosurgery. Steroids injections in past have been helpful but pain is currently present daily. Encouraged to use topical treatments and stay active as tolerated.

## 2016-05-26 NOTE — Assessment & Plan Note (Signed)
Continue daily supplements 

## 2016-06-10 DIAGNOSIS — Z6828 Body mass index (BMI) 28.0-28.9, adult: Secondary | ICD-10-CM | POA: Diagnosis not present

## 2016-06-10 DIAGNOSIS — M47892 Other spondylosis, cervical region: Secondary | ICD-10-CM | POA: Diagnosis not present

## 2016-06-10 DIAGNOSIS — M542 Cervicalgia: Secondary | ICD-10-CM | POA: Diagnosis not present

## 2016-06-10 DIAGNOSIS — I10 Essential (primary) hypertension: Secondary | ICD-10-CM | POA: Diagnosis not present

## 2016-06-12 DIAGNOSIS — M47812 Spondylosis without myelopathy or radiculopathy, cervical region: Secondary | ICD-10-CM | POA: Diagnosis not present

## 2016-06-12 DIAGNOSIS — M47892 Other spondylosis, cervical region: Secondary | ICD-10-CM | POA: Diagnosis not present

## 2016-07-15 ENCOUNTER — Emergency Department (HOSPITAL_BASED_OUTPATIENT_CLINIC_OR_DEPARTMENT_OTHER)
Admission: EM | Admit: 2016-07-15 | Discharge: 2016-07-15 | Disposition: A | Discharge status: Home / Self Care | Payer: Medicare Other | Attending: Emergency Medicine | Admitting: Emergency Medicine

## 2016-07-15 ENCOUNTER — Encounter (HOSPITAL_BASED_OUTPATIENT_CLINIC_OR_DEPARTMENT_OTHER): Payer: Self-pay | Admitting: *Deleted

## 2016-07-15 DIAGNOSIS — I10 Essential (primary) hypertension: Secondary | ICD-10-CM | POA: Insufficient documentation

## 2016-07-15 DIAGNOSIS — Z79899 Other long term (current) drug therapy: Secondary | ICD-10-CM | POA: Insufficient documentation

## 2016-07-15 DIAGNOSIS — Z87891 Personal history of nicotine dependence: Secondary | ICD-10-CM | POA: Insufficient documentation

## 2016-07-15 DIAGNOSIS — W260XXA Contact with knife, initial encounter: Secondary | ICD-10-CM | POA: Diagnosis not present

## 2016-07-15 DIAGNOSIS — Y9389 Activity, other specified: Secondary | ICD-10-CM | POA: Insufficient documentation

## 2016-07-15 DIAGNOSIS — Y929 Unspecified place or not applicable: Secondary | ICD-10-CM | POA: Insufficient documentation

## 2016-07-15 DIAGNOSIS — Y999 Unspecified external cause status: Secondary | ICD-10-CM | POA: Diagnosis not present

## 2016-07-15 DIAGNOSIS — S61211A Laceration without foreign body of left index finger without damage to nail, initial encounter: Secondary | ICD-10-CM | POA: Insufficient documentation

## 2016-07-15 MED ORDER — LIDOCAINE HCL 2 % IJ SOLN
5.0000 mL | Freq: Once | INTRAMUSCULAR | Status: DC
  Filled 2016-07-15: qty 20

## 2016-07-15 NOTE — ED Triage Notes (Signed)
Laceration to her left index finger with a knife.

## 2016-07-15 NOTE — Discharge Instructions (Signed)
Keep your laceration clean and dry. Avoid soaking. Follow-up with primary care doctor as needed.

## 2016-07-15 NOTE — ED Provider Notes (Signed)
[facility] Provider Note   CSN: [id number] Arrival date & time: 07/15/16  2039   By signing my name below, I, Evelene Croon and Charolotte Eke, attest that this documentation has been prepared under the direction and in the presence of Laddie Math, PA-C. Electronically Signed: Evelene Croon, Scribe. 07/15/2016. 11:21 PM.   History   Chief Complaint Chief Complaint  Patient presents with  . Laceration    HPI HPI Comments:  Laura [name] is a 75 y.o. female who presents to the Emergency Department complaining of a laceration to left index finger when cutting vegetables at 1700 today with a clean kitchen knife.  Pt does not complain of pain, but note difficulty controlling bleeding at the laceration site. Last Tetanus within 5 years. Pt denies hx of taking blood thinners. Pt states that she is taking ibuprofen for referred joint pain, and no other medication are being taken for pain.    The history is provided by the patient. No language interpreter was used.    Past Medical History:  Diagnosis Date  . 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 Cataract   . Chicken pox as a child  . EBV infection    mono as child  . Fibrocystic breast 05/01/2014  . GERD (gastroesophageal reflux disease)    pt denies  . Hyperlipidemia   . Hypertension 20 yrs ago  . Measles as a child  . Medicare annual wellness visit, subsequent 02/01/2015  . Mumps as a child  . Neck pain 01/22/2016  . Other and unspecified hyperlipidemia 10/13/2013  . Overweight(278.02)   . Vasovagal reaction 07/15/2015   no syncope but close     Patient Active Problem List   Diagnosis Date Noted  . Neck pain 01/22/2016  . Vasovagal reaction 07/15/2015  . History of colonic polyps 07/15/2015  . Medicare annual wellness visit, subsequent 02/01/2015  . Muscle strain 08/11/2014  . Hyperlipidemia 07/18/2014  . Fibrocystic breast 05/01/2014  . 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 Decreased visual acuity  01/23/2014  . HTN (hypertension) 01/23/2014  . Sun-damaged skin 01/23/2014  . Hot flashes 01/23/2014  . Recurrent BCC (basal cell carcinoma) 10/13/2013  . Vitamin D deficiency 10/13/2013  . Pain in joint, ankle and foot 10/13/2013  . Cancer ([place])   . Cataract   . EBV infection   . Overweight     Past Surgical History:  Procedure Laterality Date  . ABDOMINAL HYSTERECTOMY  29 yrs ago   heavy bleeding, ovaries left in place  . 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 COLONOSCOPY    . EYE SURGERY  11-27-2010   cataract surgery in both eyes  . POLYPECTOMY    . VAGINAL HYSTERECTOMY  75 yrs old   uterus removed    OB History    No data available       Home Medications    Prior to Admission medications   Medication Sig Start Date End Date Taking? Authorizing Provider  Fiber, Guar Gum, CHEW Chew by mouth as needed.    Historical Provider, MD  losartan-hydrochlorothiazide (HYZAAR) 50-12.5 MG tablet TAKE 1 TABLET BY MOUTH EVERY DAY 02/25/16   [name], MD  Multiple Vitamins-Minerals (MULTI-VITAMIN GUMMIES) CHEW Chew 1 tablet by mouth daily.    Historical Provider, MD    Family History Family History  Problem Relation Age of Onset  . Heart disease Father   . Heart attack Father 57  . Hypertension  Father   . Dementia Maternal Grandmother   . Heart disease Maternal Grandfather   . Hyperlipidemia Paternal Grandmother   . Cancer Paternal Grandfather     stomach  . Heart attack Paternal Grandfather   . Stomach cancer Paternal Grandfather   . Dementia Mother   . Pulmonary embolism Son   . Colon cancer Neg Hx   . Colon polyps Neg Hx   . Rectal cancer Neg Hx     Social History Social History  Substance Use Topics  . Smoking status: Former Smoker    Packs/day: 1.00    Years: 16.00    Types: Cigarettes    Start date: 07/29/1972  . Smokeless tobacco: Never Used  . Alcohol use 0.0 oz/week     Comment: special occasions gllass of  wine     Allergies   Lisinopril; Statins; Sulfa antibiotics; and Tetracyclines & related   Review of Systems Review of Systems  Skin: Positive for wound.  Neurological: Negative for weakness.     Physical Exam Updated Vital Signs BP 150/86   Pulse 73   Temp 98.2 F (36.8 C) (Oral)   Resp 20   Ht 5\' 4"  (1.626 m)   Wt 165 lb (74.8 kg)   SpO2 95%   BMI 28.32 kg/m   Physical Exam  Constitutional: She is oriented to person, place, and time. She appears well-developed and well-nourished. No distress.  HENT:  Head: Normocephalic and atraumatic.  Eyes: Conjunctivae are normal.  Cardiovascular: Normal rate.   Pulmonary/Chest: Effort normal.  Abdominal: She exhibits no distension.  Musculoskeletal:  1 cm flap laceration to the distal index finger of the left hand. Hemostatic at this time. A full range of motion of the finger at each joint.  Neurological: She is alert and oriented to person, place, and time.  Skin: Skin is warm and dry.  Psychiatric: She has a normal mood and affect.  Nursing note and vitals reviewed.    ED Treatments / Results   DIAGNOSTIC STUDIES:  Oxygen Saturation is 95% on room air, adequate by my interpretation.    COORDINATION OF CARE:  11:14 PM Discussed treatment plan with pt at bedside and pt agreed to plan.   Labs (all labs ordered are listed, but only abnormal results are displayed) Labs Reviewed - No data to display  EKG  EKG Interpretation None       Radiology No results found.  Procedures .Marland KitchenLaceration Repair Date/Time: 07/15/2016 11:27 PM Performed by: Jeannett Senior Authorized by: Jeannett Senior   Consent:    Consent obtained:  Verbal   Consent given by:  Patient Anesthesia (see MAR for exact dosages):    Anesthesia method:  Local infiltration   Local anesthetic:  Lidocaine 2% w/o epi Laceration details:    Location:  Finger   Finger location:  L index finger   Length (cm):  1 Repair type:    Repair  type:  Simple Pre-procedure details:    Preparation:  Patient was prepped and draped in usual sterile fashion Exploration:    Wound extent: no nerve damage noted, no tendon damage noted and no vascular damage noted     Contaminated: no   Treatment:    Area cleansed with:  Betadine and saline   Amount of cleaning:  Standard Skin repair:    Repair method:  Sutures   Suture size:  5-0   Suture material:  Fast-absorbing gut   Suture technique:  Simple interrupted   Number of sutures:  2  Approximation:    Approximation:  Close   Vermilion border: well-aligned   Post-procedure details:    Dressing:  Antibiotic ointment   Patient tolerance of procedure:  Tolerated well, no immediate complications    (including critical care time)  Medications Ordered in ED Medications - No data to display   Initial Impression / Assessment and Plan / ED Course  I have reviewed the triage vital signs and the nursing notes.  Pertinent labs & imaging results that were available during my care of the patient were reviewed by me and considered in my medical decision making (see chart for details).  Clinical Course      Tetanus UTD. Laceration occurred < 12 hours prior to repair. Discussed laceration care with pt and answered questions. Pt to f-u for suture removal in 10  days and wound check sooner should there be signs of dehiscence or infection. Pt is hemodynamically stable with no complaints prior to dc.    Vitals:   07/15/16 2045 07/15/16 2046 07/15/16 2344  BP:  150/86   Pulse:  73 88  Resp:  20 18  Temp:  98.2 F (36.8 C)   TempSrc:  Oral   SpO2:  95% 100%  Weight: 74.8 kg    Height: 5\' 4"  (1.626 m)      Final Clinical Impressions(s) / ED Diagnoses   Final diagnoses:  Laceration of left index finger without foreign body without damage to nail, initial encounter    New Prescriptions New Prescriptions   No medications on file   I personally performed the services described in  this documentation, which was scribed in my presence. The recorded information has been reviewed and is accurate.    Jeannett Senior, PA-C 07/16/16 [place], DO 07/16/16 1625

## 2016-07-30 DIAGNOSIS — M47892 Other spondylosis, cervical region: Secondary | ICD-10-CM | POA: Diagnosis not present

## 2016-07-30 DIAGNOSIS — M542 Cervicalgia: Secondary | ICD-10-CM | POA: Diagnosis not present

## 2016-07-30 DIAGNOSIS — I10 Essential (primary) hypertension: Secondary | ICD-10-CM | POA: Diagnosis not present

## 2016-07-30 DIAGNOSIS — Z6828 Body mass index (BMI) 28.0-28.9, adult: Secondary | ICD-10-CM | POA: Diagnosis not present

## 2016-08-09 ENCOUNTER — Ambulatory Visit: Payer: Medicare Other | Attending: Neurosurgery | Admitting: Physical Therapy

## 2016-08-09 DIAGNOSIS — M542 Cervicalgia: Secondary | ICD-10-CM | POA: Diagnosis not present

## 2016-08-09 DIAGNOSIS — R293 Abnormal posture: Secondary | ICD-10-CM | POA: Insufficient documentation

## 2016-08-09 NOTE — Patient Instructions (Signed)
Axial Extension (Chin Tuck)    Pull chin in and lengthen back of neck. Hold __5-10__ seconds while counting out loud. Repeat __15__ times. Do __2__ sessions per day.    Scapular Retraction (Standing)    With arms at sides, pinch shoulder blades together. Repeat __15__ times per set. Do ___2_ sets per session. Hold 3-5 seconds    NECK TENSION: Assisted Stretch    Reach right arm around head and hold slightly above ear. Gently bring right ear toward right shoulder. Hold position for _30__ breaths. Repeat with other arm. Repeat _3_ times, alternating arms. Do _2__ times per day.

## 2016-08-09 NOTE — Therapy (Signed)
[facility] [address]  [place], [place], [address] Phone: [phone]   Fax:  [phone]  Physical Therapy Evaluation  Patient Details  Name: Tamee Leisher MRN: [id number] Date of Birth: [birth year] Referring Provider: Dr. Marylyn Ishihara L. Cabbell  Encounter Date: 08/09/2016      PT End of Session - 08/09/16 1031    Visit Number 1   Number of Visits 12   Date for PT Re-Evaluation 09/20/16   Authorization Type Medicare   PT Start Time 0933   PT Stop Time 1026   PT Time Calculation (min) 53 min   Activity Tolerance Patient tolerated treatment well   Behavior During Therapy WFL for tasks assessed/performed      Past Medical History:  Diagnosis Date  . 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 Cataract   . Chicken pox as a child  . EBV infection    mono as child  . Fibrocystic breast 05/01/2014  . GERD (gastroesophageal reflux disease)    pt denies  . Hyperlipidemia   . Hypertension 20 yrs ago  . Measles as a child  . Medicare annual wellness visit, subsequent 02/01/2015  . Mumps as a child  . Neck pain 01/22/2016  . Other and unspecified hyperlipidemia 10/13/2013  . Overweight(278.02)   . Vasovagal reaction 07/15/2015   no syncope but close     Past Surgical History:  Procedure Laterality Date  . ABDOMINAL HYSTERECTOMY  29 yrs ago   heavy bleeding, ovaries left in place  . 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 COLONOSCOPY    . EYE SURGERY  11-27-2010   cataract surgery in both eyes  . POLYPECTOMY    . VAGINAL HYSTERECTOMY  76 yrs old   uterus removed    There were no vitals filed for this visit.       Subjective Assessment - 08/09/16 0934    Subjective Patient reporting degenerative changes in cervical spine. Has difficulty getting out of car - had to strain to get out of backseat - feels like muscles are strained. Has had MRI, injections - with little  relief. Reports she has been doing some self-research - feels like it is a muscular problem. Has had prior therapy for same issue. Takes ibuprofen 2x/day, tylenol 1x/day. Denies N&T into arms and hands.    Pertinent History HTN   Diagnostic tests MRI - degenerative changes   Patient Stated Goals reduce pain   Currently in Pain? 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 Marylyn Ishihara L. Cabbell   Onset Date/Surgical Date --  1 year ago   Hand Dominance Right;Left   Next MD Visit 2 months   Prior Therapy yes     Precautions   Precautions None     Balance Screen   Has the patient fallen in the past 6 months No   Has the patient had a decrease in activity level because of a fear  of falling?  No   Is the patient reluctant to leave their home because of a fear of falling?  No     Home Environment   Living Environment Private residence   Living Arrangements Spouse/significant other   Type of [place]     Prior Function   Level of [place]   Overall Cognitive Status Within Functional Limits for tasks assessed     Observation/Other Assessments   Focus on Therapeutic Outcomes (FOTO)  Neck: 46 (54% limited, predicted 43% limited)     Posture/Postural Control   Posture/Postural Control Postural limitations   Postural Limitations Rounded Shoulders;Forward head     ROM / Strength   AROM / PROM / Strength AROM;PROM     AROM   AROM Assessment Site Cervical   Cervical Flexion 52   Cervical Extension 47   Cervical - Right Side Bend 21   Cervical - Left Side Bend 9   Cervical - Right Rotation  52   Cervical - Left Rotation 34     PROM   Overall PROM Comments Patient heavily guarded with all passive movements                            PT Education - 08/09/16 1030    Education provided Yes   Education Details exam findings, POC, HEP   Person(s) Educated Patient   Methods Explanation;Demonstration;Handout   Comprehension Verbalized understanding;Returned demonstration;Need further instruction             PT Long Term Goals - 08/09/16 1039      PT LONG TERM GOAL #1   Title Patient to be independent with advanced HEP (09/20/16)   Status New     PT LONG TERM GOAL #2   Title Patient to improve AROM cervical lateral flexion and rotation symmetrical and WFL for improve fucntion (09/20/16)   Status New     PT LONG TERM GOAL #3   Title Patient to report ability to return to household tasks without increase in neck pain (09/20/16)     PT LONG TERM GOAL #4   Title Patient to improve posture to neutral alignment with reduced forward head/rounded shoulders to reduce stress placed on neck/upper back musculature (09/20/16)   Status New               Plan - 08/09/16 1036    Clinical Impression Statement Patient is a 76 y/o female presenting to [place] today for low complexiy evaluation regarding neck pain limiting her overall function and mobility. Patient today with reduced cervical AROM, with primary limitations in cervical lateral flexion and rotation, poor posture with rounded shoulders and forward head, as well as reduced tissue quality with palpable tissue tightness noted throughout B UT, B levator scap, and B cervical paraspinals. Patient heavily guarded with all passive movements. Patient to benefit from PT to improve the above listed deficits to allow for improved function and overall improved quality of life.    Rehab Potential Good   PT Frequency 2x / week   PT Duration 6 weeks   PT Treatment/Interventions ADLs/Self Care Home  Management;Cryotherapy;Electrical Stimulation;Moist Heat;Traction;Ultrasound;Therapeutic exercise;Therapeutic activities;Patient/family education;Manual techniques;Passive range of motion;Taping;Dry needling   PT Next Visit Plan manual work, periscapular strength, modalities prn for pain   Consulted and Agree with Plan of Care Patient      Patient will benefit from skilled therapeutic intervention in order to improve the  following deficits and impairments:  Decreased activity tolerance, Decreased range of motion, Increased muscle spasms, Increased fascial restricitons, Pain  Visit Diagnosis: Cervicalgia - Plan: PT plan of care cert/re-cert  Abnormal posture - Plan: PT plan of care cert/re-cert      G-Codes - [id number]    Functional Assessment Tool Used FOTO: 54% limited   Functional Limitation Changing and maintaining body position   Changing and Maintaining Body Position Current Status [id number]) At least 40 percent but less than 60 percent impaired, limited or restricted   Changing and Maintaining Body Position Goal Status [id number]) At least 40 percent but less than 60 percent impaired, limited or restricted       Problem List Patient Active Problem List   Diagnosis Date Noted  . Neck pain 01/22/2016  . Vasovagal reaction 07/15/2015  . History of colonic polyps 07/15/2015  . Medicare annual wellness visit, subsequent 02/01/2015  . Muscle strain 08/11/2014  . Hyperlipidemia 07/18/2014  . Fibrocystic breast 05/01/2014  . 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 Decreased visual acuity 01/23/2014  . HTN (hypertension) 01/23/2014  . Sun-damaged skin 01/23/2014  . Hot flashes 01/23/2014  . Recurrent BCC (basal cell carcinoma) 10/13/2013  . Vitamin D deficiency 10/13/2013  . Pain in joint, ankle and foot 10/13/2013  . Cancer ([place])   . Cataract   . EBV infection   . Overweight      Lanney Gins, PT, DPT 08/09/16 12:11 PM    [facility] [address]  [place], [place], [address] Phone: [phone]   Fax:  [phone]  Name: Sonyia Poeschel MRN: [id number] Date of Birth: 08/15/1940

## 2016-08-13 ENCOUNTER — Ambulatory Visit: Payer: Medicare Other | Admitting: Physical Therapy

## 2016-08-13 DIAGNOSIS — R293 Abnormal posture: Secondary | ICD-10-CM | POA: Diagnosis not present

## 2016-08-13 DIAGNOSIS — M542 Cervicalgia: Secondary | ICD-10-CM

## 2016-08-13 NOTE — Therapy (Signed)
[facility] [address]  [place], [place], [address] Phone: [phone]   Fax:  [phone]  Physical Therapy Treatment  Patient Details  Name: Laura [name] MRN: [id number] Date of Birth: 02/06/1941 Referring Provider: Dr. Marylyn Ishihara L. Cabbell  Encounter Date: 08/13/2016      PT End of Session - 08/13/16 1344    Visit Number 2   Number of Visits 12   Date for PT Re-Evaluation 09/20/16   Authorization Type Medicare   PT Start Time 1014   PT Stop Time 1113   PT Time Calculation (min) 59 min   Activity Tolerance Patient tolerated treatment well   Behavior During Therapy WFL for tasks assessed/performed      Past Medical History:  Diagnosis Date  . Cancer ([place])    basal on back and nose  . Cataract   . Chicken pox as a child  . EBV infection    mono as child  . Fibrocystic breast 05/01/2014  . GERD (gastroesophageal reflux disease)    pt denies  . Hyperlipidemia   . Hypertension 20 yrs ago  . Measles as a child  . Medicare annual wellness visit, subsequent 02/01/2015  . Mumps as a child  . Neck pain 01/22/2016  . Other and unspecified hyperlipidemia 10/13/2013  . Overweight(278.02)   . Vasovagal reaction 07/15/2015   no syncope but close     Past Surgical History:  Procedure Laterality Date  . ABDOMINAL HYSTERECTOMY  29 yrs ago   heavy bleeding, ovaries left in place  . 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 COLONOSCOPY    . EYE SURGERY  11-27-2010   cataract surgery in both eyes  . POLYPECTOMY    . VAGINAL HYSTERECTOMY  76 yrs old   uterus removed    There were no vitals filed for this visit.      Subjective Assessment - 08/13/16 1339    Subjective Had a lot of pain after eval - questioning moist heat use at home   Pertinent History HTN   Diagnostic tests MRI - degenerative changes   Patient Stated Goals reduce pain   Currently in Pain? Yes   Pain  Score 7    Pain Location Neck   Pain Orientation Left   Pain Descriptors / Indicators Aching;Tightness   Pain Type Chronic pain   Pain Frequency Constant   Aggravating Factors  turning, housework   Pain Relieving Factors pain meds, rest                         [facility] Adult PT Treatment/Exercise - 08/13/16 0001      Exercises   Exercises Neck     Neck Exercises: Theraband   Scapula Retraction 10 reps     Neck Exercises: Standing   Neck Retraction 10 reps     Modalities   Modalities Moist Heat;Electrical Stimulation     Moist Heat Therapy   Number Minutes Moist Heat 15 Minutes   Moist Heat Location Cervical     Electrical Stimulation   Electrical Stimulation Location B cervical paraspinals to B UT   Electrical Stimulation Action IFC    Electrical Stimulation Parameters to tolerance   Electrical Stimulation Goals Pain;Tone     Manual Therapy   Manual Therapy Joint mobilization;Soft tissue mobilization;Myofascial release;Passive ROM   Joint Mobilization Grade I-II CPA with patient supine from approx C3 to C6  Soft tissue mobilization B UT, B levator scap, B cervical paraspinals   Myofascial Release Manual trigger point release of B UT and B levator scap   Passive ROM B cervical lateral flexion with slight overpressure for UT stretch 2 x 1 minute each; B cervical rotation for ROM 1 x 1 min each                     PT Long Term Goals - 08/09/16 1039      PT LONG TERM GOAL #1   Title Patient to be independent with advanced HEP (09/20/16)   Status New     PT LONG TERM GOAL #2   Title Patient to improve AROM cervical lateral flexion and rotation symmetrical and WFL for improve fucntion (09/20/16)   Status New     PT LONG TERM GOAL #3   Title Patient to report ability to return to household tasks without increase in neck pain (09/20/16)     PT LONG TERM GOAL #4   Title Patient to improve posture to neutral alignment with reduced forward  head/rounded shoulders to reduce stress placed on neck/upper back musculature (09/20/16)   Status New               Plan - 08/13/16 1345    Clinical Impression Statement Patient today with subjective reports of increased neck pain following initial eval. Patient today continues to be heavily guarded with all passive movements and with soft tissue work. Patient questioning if PT will help her after only initial assessment. Discussion regarding need for time to allow PT interventions to work as well as for good compliance with HEP.    PT Treatment/Interventions ADLs/Self Care Home Management;Cryotherapy;Electrical Stimulation;Moist Heat;Traction;Ultrasound;Therapeutic exercise;Therapeutic activities;Patient/family education;Manual techniques;Passive range of motion;Taping;Dry needling   PT Next Visit Plan manual work, periscapular strength, modalities prn for pain   Consulted and Agree with Plan of Care Patient      Patient will benefit from skilled therapeutic intervention in order to improve the following deficits and impairments:  Decreased activity tolerance, Decreased range of motion, Increased muscle spasms, Increased fascial restricitons, Pain  Visit Diagnosis: Cervicalgia  Abnormal posture     Problem List Patient Active Problem List   Diagnosis Date Noted  . Neck pain 01/22/2016  . Vasovagal reaction 07/15/2015  . History of colonic polyps 07/15/2015  . Medicare annual wellness visit, subsequent 02/01/2015  . Muscle strain 08/11/2014  . Hyperlipidemia 07/18/2014  . Fibrocystic breast 05/01/2014  . 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 Decreased visual acuity 01/23/2014  . HTN (hypertension) 01/23/2014  . Sun-damaged skin 01/23/2014  . Hot flashes 01/23/2014  . Recurrent BCC (basal cell carcinoma) 10/13/2013  . Vitamin D deficiency 10/13/2013  . Pain in joint, ankle and foot 10/13/2013  . Cancer ([place])   . Cataract   . EBV infection   . Overweight      Lanney Gins, PT, DPT 08/13/16 1:48 PM   [facility] [address]  [place], [place], [address] Phone: [phone]   Fax:  [phone]  Name: Armita Waag MRN: [id number] Date of Birth: 08-Apr-1941

## 2016-08-16 ENCOUNTER — Ambulatory Visit: Payer: Medicare Other | Admitting: Physical Therapy

## 2016-08-16 ENCOUNTER — Encounter: Payer: Self-pay | Admitting: Family Medicine

## 2016-08-19 ENCOUNTER — Ambulatory Visit: Payer: Medicare Other | Admitting: Physical Therapy

## 2016-08-19 ENCOUNTER — Telehealth: Payer: Self-pay | Admitting: Family Medicine

## 2016-08-19 DIAGNOSIS — M542 Cervicalgia: Secondary | ICD-10-CM

## 2016-08-19 DIAGNOSIS — R293 Abnormal posture: Secondary | ICD-10-CM

## 2016-08-19 MED ORDER — TIZANIDINE HCL 2 MG PO TABS: 2.0000 mg | ORAL_TABLET | Freq: Two times a day (BID) | ORAL | 0 refills | Status: DC | PRN

## 2016-08-19 NOTE — Patient Instructions (Signed)
Levator Scapula Stretch, Sitting    Sit, one hand tucked under hip on side to be stretched, other hand over top of head. Turn head toward other side and look down. Use hand on head to gently stretch neck in that position. Hold _30__ seconds. Repeat _3__ times per session. Do _2-3__ sessions per day.  Copyright  VHI. All rights reserved.

## 2016-08-19 NOTE — Telephone Encounter (Signed)
Sent in tizanidine to [facility] Patient informed of PCP instructions and verbally understood.

## 2016-08-19 NOTE — Telephone Encounter (Signed)
So she can have an rx for Tizanidine 2 mg tab, 1 tab po bid prn muscle spasm. Disp #30 and see if that helps. Most people due not have fatigue or confusion with this med but everyone is different so will need to let her know we will not know how she tolerates this med til she tries it.

## 2016-08-19 NOTE — Telephone Encounter (Signed)
Caller name: Shabana Relation to pt: self Call back number: [phone] [id number] [place], [place] - [address]OSS [address]  Reason for call: Pt came in office wanting to ask her PCP since the pt is having physical therapy across the hall and states that [place] from physical therapy mentioned that pt has knots on her shoulder and area of neck. Pt would like to know if she can get some muscle relaxer but that would not affect her blood brain system. (pt mentioned that). Please advise.

## 2016-08-19 NOTE — Therapy (Signed)
[facility] [address]  [place], [place], [address] Phone: [phone]   Fax:  [phone]  Physical Therapy Treatment  Patient Details  Name: Laura [name] MRN: [id number] Date of Birth: [birth year] Referring Provider: Dr. Marylyn Ishihara L. Cabbell  Encounter Date: 08/19/2016      PT End of Session - 08/19/16 1301    Visit Number 3   Number of Visits 12   Date for PT Re-Evaluation 09/20/16   Authorization Type Medicare   PT Start Time [id number]   PT Stop Time 1112   PT Time Calculation (min) 57 min   Activity Tolerance Patient tolerated treatment well   Behavior During Therapy WFL for tasks assessed/performed      Past Medical History:  Diagnosis Date  . Cancer ([place])    basal on back and nose  . Cataract   . Chicken pox as a child  . EBV infection    mono as child  . Fibrocystic breast 05/01/2014  . GERD (gastroesophageal reflux disease)    pt denies  . Hyperlipidemia   . Hypertension 20 yrs ago  . Measles as a child  . Medicare annual wellness visit, subsequent 02/01/2015  . Mumps as a child  . Neck pain 01/22/2016  . Other and unspecified hyperlipidemia 10/13/2013  . Overweight(278.02)   . Vasovagal reaction 07/15/2015   no syncope but close     Past Surgical History:  Procedure Laterality Date  . ABDOMINAL HYSTERECTOMY  29 yrs ago   heavy bleeding, ovaries left in place  . 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 COLONOSCOPY    . EYE SURGERY  11-27-2010   cataract surgery in both eyes  . POLYPECTOMY    . VAGINAL HYSTERECTOMY  76 yrs old   uterus removed    There were no vitals filed for this visit.      Subjective Assessment - 08/19/16 1306    Subjective having an increase in pain - feels like weather affects her. Went and got moist heat packs for home use; feels like this is a chronic issue   Pertinent History HTN   Diagnostic tests MRI - degenerative  changes   Patient Stated Goals reduce pain   Currently in Pain? Yes   Pain Score 8    Pain Location Neck   Pain Orientation Left   Pain Descriptors / Indicators Aching;Tightness   Pain Type Chronic pain                         [facility] Adult PT Treatment/Exercise - 08/19/16 0001      Neck Exercises: Theraband   Scapula Retraction 10 reps     Neck Exercises: Standing   Neck Retraction 10 reps   Neck Retraction Limitations HEP review     Modalities   Modalities Moist Heat;Electrical Stimulation     Moist Heat Therapy   Number Minutes Moist Heat 15 Minutes   Moist Heat Location Cervical     Electrical Stimulation   Electrical Stimulation Location B cervical paraspinals to B UT   Electrical Stimulation Action IFC   Electrical Stimulation Parameters to tolerance   Electrical Stimulation Goals Pain;Tone     Manual Therapy   Manual Therapy Joint mobilization;Soft tissue mobilization;Myofascial release;Passive ROM   Joint Mobilization Grade I-II CPA with patient supine from approx C3 to C6   Soft tissue mobilization B UT, B levator scap, B  cervical paraspinals, B suboccipital mm   Myofascial Release Manual trigger point release of B UT and B levator scap; suboccipital release 1 x 1 minute   Passive ROM B cervical lateral flexion with slight overpressure for UT stretch 2 x 1 minute each; B cervical rotation for ROM 1 x 1 min each                     PT Long Term Goals - 08/19/16 1301      PT LONG TERM GOAL #1   Title Patient to be independent with advanced HEP (09/20/16)   Status On-going     PT LONG TERM GOAL #2   Title Patient to improve AROM cervical lateral flexion and rotation symmetrical and WFL for improve fucntion (09/20/16)   Status On-going     PT LONG TERM GOAL #3   Title Patient to report ability to return to household tasks without increase in neck pain (09/20/16)   Status On-going     PT LONG TERM GOAL #4   Title Patient to improve  posture to neutral alignment with reduced forward head/rounded shoulders to reduce stress placed on neck/upper back musculature (09/20/16)   Status On-going               Plan - 08/19/16 1302    Clinical Impression Statement Patient today with an increase in pain - subjective reports of feeling like weather affects her along with self-realization that this may be a chronic issue due to bone spurs in cervical spine reported by patient. PT session continuing to heavily focus on manual therpay to reduce tissue tightness in neck and upper back musculature for improve ROM and overall pain levels. Patient instructed on levator scap stretch today with good carryoer. Patient to continue to benefit from PT to maximize function.    PT Treatment/Interventions ADLs/Self Care Home Management;Cryotherapy;Electrical Stimulation;Moist Heat;Traction;Ultrasound;Therapeutic exercise;Therapeutic activities;Patient/family education;Manual techniques;Passive range of motion;Taping;Dry needling   PT Next Visit Plan manual work, periscapular strength, modalities prn for pain   Consulted and Agree with Plan of Care Patient      Patient will benefit from skilled therapeutic intervention in order to improve the following deficits and impairments:  Decreased activity tolerance, Decreased range of motion, Increased muscle spasms, Increased fascial restricitons, Pain  Visit Diagnosis: Cervicalgia  Abnormal posture     Problem List Patient Active Problem List   Diagnosis Date Noted  . Neck pain 01/22/2016  . Vasovagal reaction 07/15/2015  . History of colonic polyps 07/15/2015  . Medicare annual wellness visit, subsequent 02/01/2015  . Muscle strain 08/11/2014  . Hyperlipidemia 07/18/2014  . Fibrocystic breast 05/01/2014  . 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 Decreased visual acuity 01/23/2014  . HTN (hypertension) 01/23/2014  . Sun-damaged skin 01/23/2014  . Hot flashes 01/23/2014  . Recurrent BCC (basal cell  carcinoma) 10/13/2013  . Vitamin D deficiency 10/13/2013  . Pain in joint, ankle and foot 10/13/2013  . Cancer ([place])   . Cataract   . EBV infection   . Overweight     Lanney Gins, PT, DPT 08/19/16 1:09 PM   [facility] [address]  [place], [place], [address] Phone: [phone]   Fax:  [phone]  Name: Laura [name] MRN: [id number] Date of Birth: 11-06-1940

## 2016-08-22 ENCOUNTER — Ambulatory Visit: Payer: Medicare Other | Admitting: Physical Therapy

## 2016-08-23 ENCOUNTER — Ambulatory Visit: Payer: Medicare Other | Admitting: Physical Therapy

## 2016-08-23 DIAGNOSIS — R293 Abnormal posture: Secondary | ICD-10-CM | POA: Diagnosis not present

## 2016-08-23 DIAGNOSIS — M542 Cervicalgia: Secondary | ICD-10-CM | POA: Diagnosis not present

## 2016-08-23 NOTE — Therapy (Addendum)
[facility] [address]  [place], [place], [address] Phone: [phone]   Fax:  [phone]  Physical Therapy Treatment  Patient Details  Name: [name] MRN: [id number] Date of Birth: 02/18/1941 Referring Provider: Dr. Marylyn Ishihara L. Cabbell  Encounter Date: 08/23/2016      PT End of Session - 08/23/16 1112    Visit Number 4   Number of Visits 12   Date for PT Re-Evaluation 09/20/16   Authorization Type Medicare   PT Start Time 1005   PT Stop Time 1106   PT Time Calculation (min) 61 min   Activity Tolerance Patient tolerated treatment well   Behavior During Therapy WFL for tasks assessed/performed      Past Medical History:  Diagnosis Date  . Cancer ([place])    basal on back and nose  . Cataract   . Chicken pox as a child  . EBV infection    mono as child  . Fibrocystic breast 05/01/2014  . GERD (gastroesophageal reflux disease)    pt denies  . Hyperlipidemia   . Hypertension 20 yrs ago  . Measles as a child  . Medicare annual wellness visit, subsequent 02/01/2015  . Mumps as a child  . Neck pain 01/22/2016  . Other and unspecified hyperlipidemia 10/13/2013  . Overweight(278.02)   . Vasovagal reaction 07/15/2015   no syncope but close     Past Surgical History:  Procedure Laterality Date  . ABDOMINAL HYSTERECTOMY  29 yrs ago   heavy bleeding, ovaries left in place  . 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 COLONOSCOPY    . EYE SURGERY  11-27-2010   cataract surgery in both eyes  . POLYPECTOMY    . VAGINAL HYSTERECTOMY  76 yrs old   uterus removed    There were no vitals filed for this visit.      Subjective Assessment - 08/23/16 1009    Subjective Got muscle relaxers from MD - not sure if shes going to take due to side effects   Diagnostic tests MRI - degenerative changes   Patient Stated Goals reduce pain   Currently in Pain? Yes   Pain Score 7    Pain Location Neck   Pain Orientation Left   Pain Descriptors / Indicators Aching;Sore   Pain Type Chronic pain   Pain Frequency Constant   Aggravating Factors  turning, housework   Pain Relieving Factors pain meds, rest                         [facility] Adult PT Treatment/Exercise - 08/23/16 0001      Neck Exercises: Standing   Neck Retraction 10 reps   Neck Retraction Limitations HEP review     Neck Exercises: Seated   Other Seated Exercise scap retraction with 3 second hold     Modalities   Modalities Moist Heat;Electrical Stimulation     Moist Heat Therapy   Number Minutes Moist Heat 15 Minutes   Moist Heat Location Cervical     Electrical Stimulation   Electrical Stimulation Location B cervical paraspinals to B UT   Electrical Stimulation Action IFC   Electrical Stimulation Parameters tol toelrance   Electrical Stimulation Goals Pain;Tone     Manual Therapy   Manual Therapy Joint mobilization;Soft tissue mobilization;Passive ROM   Joint Mobilization Grade I-II CPA with patient supine from approx C3 to T1   Soft  tissue mobilization B UT, B levator scap, B cervical paraspinals, B suboccipital mm, B suboccipital mm   Passive ROM B cervical lateral flexion with slight overpressure for UT stretch 3 x 1 minute each; B cervical flexion rotation 1 x 1 min each for levator scap stretch                     PT Long Term Goals - 08/19/16 1301      PT LONG TERM GOAL #1   Title Patient to be independent with advanced HEP (09/20/16)   Status On-going     PT LONG TERM GOAL #2   Title Patient to improve AROM cervical lateral flexion and rotation symmetrical and WFL for improve fucntion (09/20/16)   Status On-going     PT LONG TERM GOAL #3   Title Patient to report ability to return to household tasks without increase in neck pain (09/20/16)   Status On-going     PT LONG TERM GOAL #4   Title Patient to improve posture to neutral alignment with reduced  forward head/rounded shoulders to reduce stress placed on neck/upper back musculature (09/20/16)   Status On-going               Plan - 08/23/16 1102    Clinical Impression Statement Patient today with continued pain and tightness of neck and upper back musculature. Patient recently seeing MD for muscle relaxer prescription with patient obtaining, however, patient has yet to take due to fear of possible side effects. Patient tolerating all manual work with passive stretching, however PT able to feel crepitus in cervical spine with all passive and active movements. Patient to continue to benefit from PT to maximize function.    PT Treatment/Interventions ADLs/Self Care Home Management;Cryotherapy;Electrical Stimulation;Moist Heat;Traction;Ultrasound;Therapeutic exercise;Therapeutic activities;Patient/family education;Manual techniques;Passive range of motion;Taping;Dry needling   PT Next Visit Plan manual work, periscapular strength, modalities prn for pain   Consulted and Agree with Plan of Care Patient      Patient will benefit from skilled therapeutic intervention in order to improve the following deficits and impairments:  Decreased activity tolerance, Decreased range of motion, Increased muscle spasms, Increased fascial restricitons, Pain  Visit Diagnosis: Cervicalgia  Abnormal posture     Problem List Patient Active Problem List   Diagnosis Date Noted  . Neck pain 01/22/2016  . Vasovagal reaction 07/15/2015  . History of colonic polyps 07/15/2015  . Medicare annual wellness visit, subsequent 02/01/2015  . Muscle strain 08/11/2014  . Hyperlipidemia 07/18/2014  . Fibrocystic breast 05/01/2014  . 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 Decreased visual acuity 01/23/2014  . HTN (hypertension) 01/23/2014  . Sun-damaged skin 01/23/2014  . Hot flashes 01/23/2014  . Recurrent BCC (basal cell carcinoma) 10/13/2013  . Vitamin D deficiency 10/13/2013  . Pain in joint, ankle and foot  10/13/2013  . Cancer ([place])   . Cataract   . EBV infection   . Overweight      Lanney Gins, PT, DPT 08/23/16 11:12 AM   [facility] [address]  [place], [place], [address] Phone: [phone]   Fax:  [phone]  Name: Laura [name] MRN: [id number] Date of Birth: [birth year]

## 2016-08-26 ENCOUNTER — Ambulatory Visit: Payer: Medicare Other | Admitting: Physical Therapy

## 2016-08-26 DIAGNOSIS — R293 Abnormal posture: Secondary | ICD-10-CM

## 2016-08-26 DIAGNOSIS — M542 Cervicalgia: Secondary | ICD-10-CM

## 2016-08-26 NOTE — Therapy (Signed)
[facility] [address]  [place], [place], [address] Phone: [phone]   Fax:  [phone]  Physical Therapy Treatment  Patient Details  Name: Laura [name] MRN: [id number] Date of Birth: 20-Nov-1940 Referring Provider: Dr. Marylyn Ishihara L. Cabbell  Encounter Date: 08/26/2016      PT End of Session - 08/26/16 1528    Visit Number 5   Number of Visits 12   Date for PT Re-Evaluation 09/20/16   Authorization Type Medicare   PT Start Time [id number]   PT Stop Time 1112   PT Time Calculation (min) 57 min   Activity Tolerance Patient tolerated treatment well   Behavior During Therapy WFL for tasks assessed/performed      Past Medical History:  Diagnosis Date  . Cancer ([place])    basal on back and nose  . Cataract   . Chicken pox as a child  . EBV infection    mono as child  . Fibrocystic breast 05/01/2014  . GERD (gastroesophageal reflux disease)    pt denies  . Hyperlipidemia   . Hypertension 20 yrs ago  . Measles as a child  . Medicare annual wellness visit, subsequent 02/01/2015  . Mumps as a child  . Neck pain 01/22/2016  . Other and unspecified hyperlipidemia 10/13/2013  . Overweight(278.02)   . Vasovagal reaction 07/15/2015   no syncope but close     Past Surgical History:  Procedure Laterality Date  . ABDOMINAL HYSTERECTOMY  29 yrs ago   heavy bleeding, ovaries left in place  . 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 COLONOSCOPY    . EYE SURGERY  11-27-2010   cataract surgery in both eyes  . POLYPECTOMY    . VAGINAL HYSTERECTOMY  76 yrs old   uterus removed    There were no vitals filed for this visit.      Subjective Assessment - 08/26/16 1334    Subjective Still has not taken muscle relaxers. Had minimal pain yesterday, but attributes this to "not doing anything" - seeing MD tomorrow for follow-up regarding pain   Patient Stated Goals reduce pain   Currently  in Pain? Yes   Pain Score 6    Pain Location Neck   Pain Orientation Left   Pain Descriptors / Indicators Aching;Tightness;Sore   Pain Type Chronic pain   Pain Frequency Constant   Aggravating Factors  turning, housework   Pain Relieving Factors pain meds, rest                         [facility] Adult PT Treatment/Exercise - 08/26/16 0001      Neck Exercises: Theraband   Scapula Retraction 10 reps     Neck Exercises: Standing   Neck Retraction 10 reps   Neck Retraction Limitations HEP review     Modalities   Modalities Moist Heat;Electrical Stimulation     Moist Heat Therapy   Number Minutes Moist Heat 15 Minutes   Moist Heat Location Cervical     Electrical Stimulation   Electrical Stimulation Location B cervical paraspinals to B UT   Electrical Stimulation Action IFC   Electrical Stimulation Parameters to tolerance   Electrical Stimulation Goals Pain;Tone     Manual Therapy   Manual Therapy Soft tissue mobilization;Myofascial release;Passive ROM   Soft tissue mobilization B UT, B levator scap, B cervical paraspinals, B suboccipital mm, B suboccipital mm   Myofascial  Release manual trigger point release to B UT and B levator scap   Passive ROM B cervical lateral flexion with slight overpressure for UT stretch 2 x 1 minute each; B cervical flexion rotation 1 x 1 min each for levator scap stretch - pain when returning to midline; PROM into cervical rotation to promote improved ROM - pain with R rotation with reduced ability to fully relax for passive movement                     PT Long Term Goals - 08/19/16 1301      PT LONG TERM GOAL #1   Title Patient to be independent with advanced HEP (09/20/16)   Status On-going     PT LONG TERM GOAL #2   Title Patient to improve AROM cervical lateral flexion and rotation symmetrical and WFL for improve fucntion (09/20/16)   Status On-going     PT LONG TERM GOAL #3   Title Patient to report ability to  return to household tasks without increase in neck pain (09/20/16)   Status On-going     PT LONG TERM GOAL #4   Title Patient to improve posture to neutral alignment with reduced forward head/rounded shoulders to reduce stress placed on neck/upper back musculature (09/20/16)   Status On-going               Plan - 08/26/16 1531    Clinical Impression Statement Patient today stating that she had minimal pain yesterday due to reduced activity in her day. Patient also stating that she is going to MD tomorrow for follow-up regarding overall pain levels. Patient with continued muscle tightness with palpable bands noted throughout neck and upper back musculature with pain during manual work by PT; pain noted near lateral L C3-4 area with light palpation over musculture rather than deep to transverse process. Patient with conitnued reduction in cervical ROM limiting overall function; with crepitus during small movements as well as pain "from shoulder up into head" with passive rotation. Patient requesting PT to review last years MRI with her, with PT discussing "impression" section as best as possible. Patient to continue to benefit from PT to maximize function and improve cervical mobility.    PT Treatment/Interventions ADLs/Self Care Home Management;Cryotherapy;Electrical Stimulation;Moist Heat;Traction;Ultrasound;Therapeutic exercise;Therapeutic activities;Patient/family education;Manual techniques;Passive range of motion;Taping;Dry needling   PT Next Visit Plan manual work, periscapular strength, modalities prn for pain   Consulted and Agree with Plan of Care Patient      Patient will benefit from skilled therapeutic intervention in order to improve the following deficits and impairments:  Decreased activity tolerance, Decreased range of motion, Increased muscle spasms, Increased fascial restricitons, Pain  Visit Diagnosis: Cervicalgia  Abnormal posture     Problem List Patient Active  Problem List   Diagnosis Date Noted  . Neck pain 01/22/2016  . Vasovagal reaction 07/15/2015  . History of colonic polyps 07/15/2015  . Medicare annual wellness visit, subsequent 02/01/2015  . Muscle strain 08/11/2014  . Hyperlipidemia 07/18/2014  . Fibrocystic breast 05/01/2014  . 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 Decreased visual acuity 01/23/2014  . HTN (hypertension) 01/23/2014  . Sun-damaged skin 01/23/2014  . Hot flashes 01/23/2014  . Recurrent BCC (basal cell carcinoma) 10/13/2013  . Vitamin D deficiency 10/13/2013  . Pain in joint, ankle and foot 10/13/2013  . Cancer ([place])   . Cataract   . EBV infection   . Overweight     Lanney Gins, PT, DPT 08/26/16 3:43 PM  [facility] [address]  [place], [place], [address] Phone: [phone]   Fax:  [phone]  Name: Laura [name] MRN: [id number] Date of Birth: 02/06/1941

## 2016-08-27 DIAGNOSIS — M47892 Other spondylosis, cervical region: Secondary | ICD-10-CM | POA: Diagnosis not present

## 2016-08-27 DIAGNOSIS — M542 Cervicalgia: Secondary | ICD-10-CM | POA: Diagnosis not present

## 2016-08-29 ENCOUNTER — Ambulatory Visit: Payer: Medicare Other | Admitting: Physical Therapy

## 2016-08-30 ENCOUNTER — Ambulatory Visit: Payer: Medicare Other | Attending: Neurosurgery | Admitting: Physical Therapy

## 2016-08-30 DIAGNOSIS — R293 Abnormal posture: Secondary | ICD-10-CM | POA: Insufficient documentation

## 2016-08-30 DIAGNOSIS — M542 Cervicalgia: Secondary | ICD-10-CM

## 2016-08-30 NOTE — Therapy (Addendum)
[facility] [address]  [place], [place], [address] Phone: [phone]   Fax:  [phone]  Physical Therapy Treatment  Patient Details  Name: Laura [name] MRN: [id number] Date of Birth: 03-Mar-1941 Referring Provider: Dr. Marylyn Ishihara L. Cabbell  Encounter Date: 08/30/2016      PT End of Session - 08/30/16 1105    Visit Number 6   Number of Visits 12   Date for PT Re-Evaluation 09/20/16   Authorization Type Medicare   PT Start Time 8338   PT Stop Time 1055   PT Time Calculation (min) 40 min   Activity Tolerance Patient tolerated treatment well   Behavior During Therapy WFL for tasks assessed/performed      Past Medical History:  Diagnosis Date  . Cancer ([place])    basal on back and nose  . Cataract   . Chicken pox as a child  . EBV infection    mono as child  . Fibrocystic breast 05/01/2014  . GERD (gastroesophageal reflux disease)    pt denies  . Hyperlipidemia   . Hypertension 20 yrs ago  . Measles as a child  . Medicare annual wellness visit, subsequent 02/01/2015  . Mumps as a child  . Neck pain 01/22/2016  . Other and unspecified hyperlipidemia 10/13/2013  . Overweight(278.02)   . Vasovagal reaction 07/15/2015   no syncope but close     Past Surgical History:  Procedure Laterality Date  . ABDOMINAL HYSTERECTOMY  29 yrs ago   heavy bleeding, ovaries left in place  . 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 COLONOSCOPY    . EYE SURGERY  11-27-2010   cataract surgery in both eyes  . POLYPECTOMY    . VAGINAL HYSTERECTOMY  76 yrs old   uterus removed    There were no vitals filed for this visit.      Subjective Assessment - 08/30/16 1104    Subjective Went to MD - feels like she is not getting much better with PT - MD wanting to try Botox injections   Diagnostic tests MRI - degenerative changes   Patient Stated Goals reduce pain   Currently in Pain? Yes    Pain Score 7    Pain Location Neck   Pain Orientation Left   Pain Descriptors / Indicators Aching;Tightness;Sore   Pain Type Chronic pain   Pain Frequency Constant   Aggravating Factors  turning, housework   Pain Relieving Factors pain meds, rest            [facility] PT Assessment - 08/30/16 0001      AROM   Cervical - Right Side Bend 26   Cervical - Left Side Bend 11   Cervical - Right Rotation 56   Cervical - Left Rotation 35                     [facility] Adult PT Treatment/Exercise - 08/30/16 0001      Modalities   Modalities Moist Heat;Electrical Stimulation     Moist Heat Therapy   Number Minutes Moist Heat 15 Minutes   Moist Heat Location Cervical     Electrical Stimulation   Electrical Stimulation Location B cervical paraspinals to B UT   Electrical Stimulation Action IFC   Electrical Stimulation Parameters to tolerance   Electrical Stimulation Goals Pain;Tone                PT Education -  08/30/16 1201    Education provided Yes   Education Details current status, home management of pain, dry needling and botox effects    Person(s) Educated Patient   Methods Explanation   Comprehension Verbalized understanding;Returned demonstration             PT Long Term Goals - 08/19/16 1301      PT LONG TERM GOAL #1   Title Patient to be independent with advanced HEP (09/20/16)   Status On-going     PT LONG TERM GOAL #2   Title Patient to improve AROM cervical lateral flexion and rotation symmetrical and WFL for improve fucntion (09/20/16)   Status On-going     PT LONG TERM GOAL #3   Title Patient to report ability to return to household tasks without increase in neck pain (09/20/16)   Status On-going     PT LONG TERM GOAL #4   Title Patient to improve posture to neutral alignment with reduced forward head/rounded shoulders to reduce stress placed on neck/upper back musculature (09/20/16)   Status On-going               Plan -  08/30/16 1154    Clinical Impression Statement Patient presenting to [facility] today stating she does not feel like PT has been helpful this far, other than with electrical stimulation and moist heat. Much time spent with patient regarding current status and other interventions to hopefully alleviate some symptoms, with patient declining dry needling and home TENS unit. Patient recently seeing Dr. Christella Noa this week, with patient stating MD feels as if she would benefit from Botox injection to painful site at this point with patient unsure if she will have this treatment or not. PT and patient discussing 30 day hold from PT with both parties in agreement.    PT Treatment/Interventions ADLs/Self Care Home Management;Cryotherapy;Electrical Stimulation;Moist Heat;Traction;Ultrasound;Therapeutic exercise;Therapeutic activities;Patient/family education;Manual techniques;Passive range of motion;Taping;Dry needling   PT Next Visit Plan 30 day hold   Consulted and Agree with Plan of Care Patient      Patient will benefit from skilled therapeutic intervention in order to improve the following deficits and impairments:  Decreased activity tolerance, Decreased range of motion, Increased muscle spasms, Increased fascial restricitons, Pain  Visit Diagnosis: Cervicalgia  Abnormal posture     G-Codes   Functional Assessment Tool Used Clinical judgement   Functional Limitation Changing and maintaining body position   Changing and Maintaining Body Position Discharge Status (J1914) At least 40 percent but less than 60 percent impaired, limited or restricted   Changing and Maintaining Body Position Goal Status (N8295) At least 40 percent but less than 60 percent impaired, limited or restricted     Problem List Patient Active Problem List   Diagnosis Date Noted  . Neck pain 01/22/2016  . Vasovagal reaction 07/15/2015  . History of colonic polyps 07/15/2015  . Medicare annual wellness visit, subsequent  02/01/2015  . Muscle strain 08/11/2014  . Hyperlipidemia 07/18/2014  . Fibrocystic breast 05/01/2014  . 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 Decreased visual acuity 01/23/2014  . HTN (hypertension) 01/23/2014  . Sun-damaged skin 01/23/2014  . Hot flashes 01/23/2014  . Recurrent BCC (basal cell carcinoma) 10/13/2013  . Vitamin D deficiency 10/13/2013  . Pain in joint, ankle and foot 10/13/2013  . Cancer ([place])   . Cataract   . EBV infection   . Overweight      Lanney Gins, PT, DPT 08/30/16 12:13 PM    PHYSICAL THERAPY DISCHARGE SUMMARY  Visits  from Start of Care: 6  Current functional level related to goals / functional outcomes: See above   Remaining deficits: See above - continued pain and tightness limiting functional movements   Education / Equipment: HEP  Plan: Patient agrees to discharge.  Patient goals were not met. Patient is being discharged due to lack of progress.  ?????     Lanney Gins, PT, DPT 11/01/16 8:34 AM  [facility] [address]  [name], [place], [address] Phone: [phone]   Fax:  [phone]  Name: Laura [name] MRN: [id number] Date of Birth: 1940/08/05

## 2016-09-02 ENCOUNTER — Ambulatory Visit: Payer: Medicare Other | Admitting: Physical Therapy

## 2016-09-05 ENCOUNTER — Ambulatory Visit: Payer: Medicare Other | Admitting: Physical Therapy

## 2016-09-09 ENCOUNTER — Ambulatory Visit: Payer: Medicare Other | Admitting: Physical Therapy

## 2016-09-12 ENCOUNTER — Ambulatory Visit: Payer: Medicare Other | Admitting: Physical Therapy

## 2016-09-16 ENCOUNTER — Ambulatory Visit: Payer: Medicare Other | Admitting: Physical Therapy

## 2016-09-19 ENCOUNTER — Ambulatory Visit: Payer: Medicare Other | Admitting: Physical Therapy

## 2016-09-19 DIAGNOSIS — M542 Cervicalgia: Secondary | ICD-10-CM | POA: Diagnosis not present

## 2016-09-25 ENCOUNTER — Telehealth: Payer: Self-pay | Admitting: Family Medicine

## 2016-09-25 NOTE — Telephone Encounter (Addendum)
.  Patient scheduled AWV with Hoyle Sauer 11/21/16 at 9:3am and follow up with PCP at 10am

## 2016-10-02 DIAGNOSIS — M542 Cervicalgia: Secondary | ICD-10-CM | POA: Diagnosis not present

## 2016-10-02 DIAGNOSIS — M47812 Spondylosis without myelopathy or radiculopathy, cervical region: Secondary | ICD-10-CM | POA: Diagnosis not present

## 2016-10-11 DIAGNOSIS — M47812 Spondylosis without myelopathy or radiculopathy, cervical region: Secondary | ICD-10-CM | POA: Diagnosis not present

## 2016-10-11 DIAGNOSIS — M542 Cervicalgia: Secondary | ICD-10-CM | POA: Diagnosis not present

## 2016-10-29 DIAGNOSIS — M5481 Occipital neuralgia: Secondary | ICD-10-CM | POA: Diagnosis not present

## 2016-10-29 DIAGNOSIS — M47812 Spondylosis without myelopathy or radiculopathy, cervical region: Secondary | ICD-10-CM | POA: Diagnosis not present

## 2016-10-29 DIAGNOSIS — M542 Cervicalgia: Secondary | ICD-10-CM | POA: Diagnosis not present

## 2016-11-11 DIAGNOSIS — M5481 Occipital neuralgia: Secondary | ICD-10-CM | POA: Diagnosis not present

## 2016-11-11 DIAGNOSIS — M47812 Spondylosis without myelopathy or radiculopathy, cervical region: Secondary | ICD-10-CM | POA: Diagnosis not present

## 2016-11-11 DIAGNOSIS — M542 Cervicalgia: Secondary | ICD-10-CM | POA: Diagnosis not present

## 2016-11-16 ENCOUNTER — Other Ambulatory Visit: Payer: Self-pay | Admitting: Family Medicine

## 2016-11-19 ENCOUNTER — Ambulatory Visit: Payer: Medicare Other | Attending: Physical Medicine and Rehabilitation | Admitting: Physical Therapy

## 2016-11-19 DIAGNOSIS — M542 Cervicalgia: Secondary | ICD-10-CM | POA: Diagnosis not present

## 2016-11-19 DIAGNOSIS — R293 Abnormal posture: Secondary | ICD-10-CM | POA: Insufficient documentation

## 2016-11-19 NOTE — Patient Instructions (Signed)

## 2016-11-19 NOTE — Therapy (Signed)
[facility] [address]  [place], [place], [address] Phone: [phone]   Fax:  [phone]  Physical Therapy Evaluation  Patient Details  Name: Laura [name] MRN: [id number] Date of Birth: 07-07-1941 Referring Provider: Dr. Laroy Apple  Encounter Date: 11/19/2016      PT End of Session - 11/19/16 1516    Visit Number 1   Number of Visits 12   Date for PT Re-Evaluation 12/31/16   Authorization Type Medicare   PT Start Time 1017   PT Stop Time 1055   PT Time Calculation (min) 38 min   Activity Tolerance Patient tolerated treatment well   Behavior During Therapy [facility] for tasks assessed/performed      Past Medical History:  Diagnosis Date  . 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 Cataract   . Chicken pox as a child  . EBV infection    mono as child  . Fibrocystic breast 05/01/2014  . GERD (gastroesophageal reflux disease)    pt denies  . Hyperlipidemia   . Hypertension 20 yrs ago  . Measles as a child  . Medicare annual wellness visit, subsequent 02/01/2015  . Mumps as a child  . Neck pain 01/22/2016  . Other and unspecified hyperlipidemia 10/13/2013  . Overweight(278.02)   . Vasovagal reaction 07/15/2015   no syncope but close     Past Surgical History:  Procedure Laterality Date  . ABDOMINAL HYSTERECTOMY  30 yrs ago   heavy bleeding, ovaries left in place  . 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 COLONOSCOPY    . EYE SURGERY  11-27-2010   cataract surgery in both eyes  . POLYPECTOMY    . VAGINAL HYSTERECTOMY  76 yrs old   uterus removed    There were no vitals filed for this visit.       Subjective Assessment - 11/19/16 1020    Subjective Patient reporting she had 2 shots in neck since last seen in PT - to block occipital nerve - feels like it lasted 2 days - and now pain is off and on. 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 Does have  difficulty moving head with rotation and lateral flexion. Daily pain - nothing helping other than lying flat as well as moist heat. No N&T into arms/hands.    Diagnostic tests MRI - 2017 - see chart   Patient Stated Goals "I'd like to not have any pain."   Currently in Pain? Yes   Pain Score 5    Pain Location Neck   Pain Orientation Right;Left;Posterior   Pain Descriptors / Indicators Constant;Discomfort;Aching   Pain Type Chronic pain   Pain Onset More than a month ago   Pain Frequency Intermittent   Aggravating Factors  movement   Pain Relieving Factors lying down            [facility] PT Assessment - 11/19/16 1024      Assessment   Medical Diagnosis Cervical Spondylosis; arthritis   Referring Provider Dr. Laroy Apple   Next MD Visit prn   Prior Therapy yes     Precautions   Precautions None     Restrictions   Weight Bearing Restrictions No     Balance Screen   Has the patient fallen in the past 6 months No   Has the patient had a decrease in activity level because of a fear of falling?  No  Is the patient reluctant to leave their home because of a fear of falling?  No     Home Ecologist residence   Living Arrangements Spouse/significant other   Type of [place]     Prior Function   Level of [place] Retired   Leisure cooking, family     Cognition   Overall Cognitive Status Within Functional Limits for tasks assessed     Observation/Other Assessments   Focus on Therapeutic Outcomes (FOTO)  Neck: 56 (44% limited, predicted 38% limited)     Sensation   Light Touch Appears Intact     Coordination   Gross Motor Movements are Fluid and Coordinated Yes     ROM / Strength   AROM / PROM / Strength AROM     AROM   AROM Assessment Site Cervical   Cervical Flexion 54   Cervical Extension 48   Cervical - Right Side Bend 10   Cervical - Left Side Bend 8   Cervical - Right Rotation 40   Cervical -  Left Rotation 60     Flexibility   Soft Tissue Assessment /Muscle Length yes     Palpation   Palpation comment tenderness to palpation along with pain in B UT (R>L), B LS, B cervical paraspinals (L>R), B subbocipital mm                   [facility] Adult PT Treatment/Exercise - 11/19/16 0001      Exercises   Exercises Neck     Modalities   Modalities Iontophoresis     Iontophoresis   Type of Iontophoresis Dexamethasone   Location lateral cervical region   Dose 1.0 mL   Time 80 mA; 4-6 hour patch     Manual Therapy   Manual Therapy Soft tissue mobilization;Myofascial release   Soft tissue mobilization gentle STM to B upper back and neck musculature - painful   Myofascial Release manual trigger point release to R UT - painful     Neck Exercises: Stretches   Upper Trapezius Stretch 2 reps;30 seconds   Upper Trapezius Stretch Limitations bilateral; manual   Other Neck Stretches cervical rotation; 2 reps; 30 seconds; bilateral - manual                PT Education - 11/19/16 1515    Education provided Yes   Education Details exam findings, POC, HEP, Ionto precautions   Person(s) Educated Patient   Methods Explanation;Demonstration;Handout   Comprehension Verbalized understanding;Returned demonstration             PT Long Term Goals - 11/19/16 1532      PT LONG TERM GOAL #1   Title Patient to be independent with HEP for cervical ROM/stretching and postural re-ed (12/31/16)   Status New     PT LONG TERM GOAL #2   Title Patient to improve AROM of cervical rotation to [facility] and symmetrical, as well as lateral flexion by 10 degrees and symmetrical (12/31/16)   Status New     PT LONG TERM GOAL #3   Title Patient to demonstrate good postural awareness with ability to self-correct (12/31/16)   Status New     PT LONG TERM GOAL #4   Title Patient to report ability to perform household tasks and ADLs without neck pain limiting function (12/31/16)   Status New                Plan -  11/19/16 1516    Clinical Impression Statement Patient is a 76 y/o female presenting to [place] today with primary complaints of L sided neck pain, that is chronic in nature and she has currently received injections for possible nerve block (occipital)- per patient. Patient today with continued limited AROM into lateral flexion and rotation with lateral flexion being most limited and painful. Patient also with continued pain with palpation and soft tissue work. MD writing orders to include ionto, therefore intiaited today for hopeful pain relief, as patient states recent injections have not been providing much relief. Patient to beneift form skilled PT intervention to address ROM and general tightness/pain of cervical region for hopeful improved quality of life.    Rehab Potential Good   PT Frequency 2x / week   PT Duration 6 weeks   PT Treatment/Interventions ADLs/Self Care Home Management;Cryotherapy;Electrical Stimulation;Iontophoresis 4mg /ml Dexamethasone;Moist Heat;Traction;Ultrasound;Neuromuscular re-education;Therapeutic exercise;Therapeutic activities;Patient/family education;Manual techniques;Passive range of motion;Vasopneumatic Device;Taping;Dry needling   PT Next Visit Plan gentle stretching, ionto and other modailites as indicated   Consulted and Agree with Plan of Care Patient      Patient will benefit from skilled therapeutic intervention in order to improve the following deficits and impairments:  Pain, Increased fascial restricitons, Impaired UE functional use, Decreased activity tolerance, Decreased range of motion  Visit Diagnosis: Cervicalgia - Plan: PT plan of care cert/re-cert  Abnormal posture - Plan: PT plan of care cert/re-cert      G-Codes - 74/82/70 1535    Functional Assessment Tool Used (Outpatient Only) FOTO: 56 (44% limited)    Functional Limitation Carrying, moving and handling objects   Carrying, Moving and Handling Objects Current  Status (B8675) At least 40 percent but less than 60 percent impaired, limited or restricted   Carrying, Moving and Handling Objects Goal Status (Q4920) At least 20 percent but less than 40 percent impaired, limited or restricted       Problem List Patient Active Problem List   Diagnosis Date Noted  . Neck pain 01/22/2016  . Vasovagal reaction 07/15/2015  . History of colonic polyps 07/15/2015  . Medicare annual wellness visit, subsequent 02/01/2015  . Muscle strain 08/11/2014  . Hyperlipidemia 07/18/2014  . Fibrocystic breast 05/01/2014  . 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 Decreased visual acuity 01/23/2014  . HTN (hypertension) 01/23/2014  . Sun-damaged skin 01/23/2014  . Hot flashes 01/23/2014  . Recurrent BCC (basal cell carcinoma) 10/13/2013  . Vitamin D deficiency 10/13/2013  . Pain in joint, ankle and foot 10/13/2013  . Cancer ([place])   . Cataract   . EBV infection   . Overweight      Lanney Gins, PT, DPT 11/19/16 3:40 PM   [facility] [address]  [place], [place], [address] Phone: [phone]   Fax:  [phone]  Name: Laura [name] MRN: [id number] Date of Birth: [birth year]

## 2016-11-21 ENCOUNTER — Ambulatory Visit (INDEPENDENT_AMBULATORY_CARE_PROVIDER_SITE_OTHER): Payer: Medicare Other | Admitting: Family Medicine

## 2016-11-21 ENCOUNTER — Ambulatory Visit: Payer: Medicare Other | Admitting: *Deleted

## 2016-11-21 ENCOUNTER — Ambulatory Visit: Payer: Medicare Other | Admitting: Physical Therapy

## 2016-11-21 ENCOUNTER — Encounter: Payer: Self-pay | Admitting: Family Medicine

## 2016-11-21 VITALS — BP 138/68 | HR 84 | Temp 97.9°F | Resp 18 | Ht 64.0 in

## 2016-11-21 DIAGNOSIS — R232 Flushing: Secondary | ICD-10-CM | POA: Diagnosis not present

## 2016-11-21 DIAGNOSIS — E782 Mixed hyperlipidemia: Secondary | ICD-10-CM | POA: Diagnosis not present

## 2016-11-21 DIAGNOSIS — E559 Vitamin D deficiency, unspecified: Secondary | ICD-10-CM

## 2016-11-21 DIAGNOSIS — E663 Overweight: Secondary | ICD-10-CM | POA: Diagnosis not present

## 2016-11-21 DIAGNOSIS — I10 Essential (primary) hypertension: Secondary | ICD-10-CM | POA: Diagnosis not present

## 2016-11-21 DIAGNOSIS — Z0000 Encounter for general adult medical examination without abnormal findings: Secondary | ICD-10-CM | POA: Diagnosis not present

## 2016-11-21 DIAGNOSIS — M542 Cervicalgia: Secondary | ICD-10-CM | POA: Diagnosis not present

## 2016-11-21 LAB — CBC
HCT: 41.4 % (ref 36.0–46.0)
Hemoglobin: 13.9 g/dL (ref 12.0–15.0)
MCHC: 33.7 g/dL (ref 30.0–36.0)
MCV: 88 fl (ref 78.0–100.0)
Platelets: 299 10*3/uL (ref 150.0–400.0)
RBC: 4.71 Mil/uL (ref 3.87–5.11)
RDW: 13.3 % (ref 11.5–15.5)
WBC: 6.7 10*3/uL (ref 4.0–10.5)

## 2016-11-21 LAB — COMPREHENSIVE METABOLIC PANEL
ALT: 12 U/L (ref 0–35)
AST: 15 U/L (ref 0–37)
Albumin: 4.2 g/dL (ref 3.5–5.2)
Alkaline Phosphatase: 48 U/L (ref 39–117)
BUN: 24 mg/dL — ABNORMAL HIGH (ref 6–23)
CO2: 27 mEq/L (ref 19–32)
Calcium: 9.8 mg/dL (ref 8.4–10.5)
Chloride: 102 mEq/L (ref 96–112)
Creatinine, Ser: 0.87 mg/dL (ref 0.40–1.20)
GFR: 67.55 mL/min (ref >60.00)
Glucose, Bld: 88 mg/dL (ref 70–99)
Potassium: 4.2 mEq/L (ref 3.5–5.1)
Sodium: 135 mEq/L (ref 135–145)
Total Bilirubin: 0.3 mg/dL (ref 0.2–1.2)
Total Protein: 7.2 g/dL (ref 6.0–8.3)

## 2016-11-21 LAB — LIPID PANEL
Cholesterol: 258 mg/dL — ABNORMAL HIGH (ref 0–200)
HDL: 48.5 mg/dL (ref >39.00)
LDL Cholesterol: 175 mg/dL — ABNORMAL HIGH (ref 0–99)
NonHDL: 209.84
Total CHOL/HDL Ratio: 5
Triglycerides: 172 mg/dL — ABNORMAL HIGH (ref 0.0–149.0)
VLDL: 34.4 mg/dL (ref 0.0–40.0)

## 2016-11-21 LAB — VITAMIN D 25 HYDROXY (VIT D DEFICIENCY, FRACTURES): VITD: 29.59 ng/mL — ABNORMAL LOW (ref 30.00–100.00)

## 2016-11-21 LAB — TSH: TSH: 2.65 u[IU]/mL (ref 0.35–4.50)

## 2016-11-21 NOTE — Patient Instructions (Addendum)
Bring a copy of your advance directives to your next office visit.  Eat heart healthy diet (full of fruits, vegetables, whole grains, lean protein, water--limit salt, fat, and sugar intake) and increase physical activity as tolerated.  Encouraged increased hydration and fiber in diet. Daily probiotics. If bowels not moving can use MOM 2 tbls po in 4 oz of warm prune juice by mouth every 2-3 days. If no results then repeat in 4 hours with  Dulcolax suppository pr, may repeat again in 4 more hours as needed. Seek care if symptoms worsen. Consider daily Miralax and/or Dulcolax if symptoms persist. Needs 64 oz of clear fluids daily, add some fiber gummies or Benefiber Keep Naproxen to 500 mg once daily Max Tylenol?Acetaminophen 3000 mg in 24 hours EX is 522m, take 500 to 1000 mg once to twice .  Preventive Care 672Years and Older, Female Preventive care refers to lifestyle choices and visits with your health care provider that can promote health and wellness. What does preventive care include?  A yearly physical exam. This is also called an annual well check.  Dental exams once or twice a year.  Routine eye exams. Ask your health care provider how often you should have your eyes checked.  Personal lifestyle choices, including:  Daily care of your teeth and gums.  Regular physical activity.  Eating a healthy diet.  Avoiding tobacco and drug use.  Limiting alcohol use.  Practicing safe sex.  Taking low-dose aspirin every day.  Taking vitamin and mineral supplements as recommended by your health care provider. What happens during an annual well check? The services and screenings done by your health care provider during your annual well check will depend on your age, overall health, lifestyle risk factors, and family history of disease. Counseling  Your health care provider may ask you questions about your:  Alcohol use.  Tobacco use.  Drug use.  Emotional well-being.  Home  and relationship well-being.  Sexual activity.  Eating habits.  History of falls.  Memory and ability to understand (cognition).  Work and work eStatistician  Reproductive health. Screening  You may have the following tests or measurements:  Height, weight, and BMI.  Blood pressure.  Lipid and cholesterol levels. These may be checked every 5 years, or more frequently if you are over 588years old.  Skin check.  Lung cancer screening. You may have this screening every year starting at age 7637if you have a 30-pack-year history of smoking and currently smoke or have quit within the past 15 years.  Fecal occult blood test (FOBT) of the stool. You may have this test every year starting at age 75  Flexible sigmoidoscopy or colonoscopy. You may have a sigmoidoscopy every 5 years or a colonoscopy every 10 years starting at age 75  Hepatitis C blood test.  Hepatitis B blood test.  Sexually transmitted disease (STD) testing.  Diabetes screening. This is done by checking your blood sugar (glucose) after you have not eaten for a while (fasting). You may have this done every 1-3 years.  Bone density scan. This is done to screen for osteoporosis. You may have this done starting at age 75  Mammogram. This may be done every 1-2 years. Talk to your health care provider about how often you should have regular mammograms. Talk with your health care provider about your test results, treatment options, and if necessary, the need for more tests. Vaccines  Your health care provider may recommend certain vaccines, such as:  Influenza vaccine. This is recommended every year.  Tetanus, diphtheria, and acellular pertussis (Tdap, Td) vaccine. You may need a Td booster every 10 years.  Varicella vaccine. You may need this if you have not been vaccinated.  Zoster vaccine. You may need this after age 29.  Measles, mumps, and rubella (MMR) vaccine. You may need at least one dose of MMR if you  were born in 1957 or later. You may also need a second dose.  Pneumococcal 13-valent conjugate (PCV13) vaccine. One dose is recommended after age 69.  Pneumococcal polysaccharide (PPSV23) vaccine. One dose is recommended after age 66.  Meningococcal vaccine. You may need this if you have certain conditions. Hepatitis A vaccine. You may need this if you have certain conditions or if you travel or work in places where you may be  Constipation, Adult Constipation is when a person has fewer bowel movements in a week than normal, has difficulty having a bowel movement, or has stools that are dry, hard, or larger than normal. Constipation may be caused by an underlying condition. It may become worse with age if a person takes certain medicines and does not take in enough fluids. Follow these instructions at home: Eating and drinking    Eat foods that have a lot of fiber, such as fresh fruits and vegetables, whole grains, and beans.  Limit foods that are high in fat, low in fiber, or overly processed, such as french fries, hamburgers, cookies, candies, and soda.  Drink enough fluid to keep your urine clear or pale yellow. General instructions   Exercise regularly or as told by your health care provider.  Go to the restroom when you have the urge to go. Do not hold it in.  Take over-the-counter and prescription medicines only as told by your health care provider. These include any fiber supplements.  Practice pelvic floor retraining exercises, such as deep breathing while relaxing the lower abdomen and pelvic floor relaxation during bowel movements.  Watch your condition for any changes.  Keep all follow-up visits as told by your health care provider. This is important. Contact a health care provider if:  You have pain that gets worse.  You have a fever.  You do not have a bowel movement after 4 days.  You vomit.  You are not hungry.  You lose weight.  You are bleeding from the  anus.  You have thin, pencil-like stools. Get help right away if:  You have a fever and your symptoms suddenly get worse.  You leak stool or have blood in your stool.  Your abdomen is bloated.  You have severe pain in your abdomen.  You feel dizzy or you faint. This information is not intended to replace advice given to you by your health care provider. Make sure you discuss any questions you have with your health care provider. Document Released: 04/12/2004 Document Revised: 02/02/2016 Document Reviewed: 01/03/2016 Elsevier Interactive Patient Education  2017 [employer].  exposed to hepatitis A.  Hepatitis B vaccine. You may need this if you have certain conditions or if you travel or work in places where you may be exposed to hepatitis B.  Haemophilus influenzae type b (Hib) vaccine. You may need this if you have certain conditions. Talk to your health care provider about which screenings and vaccines you need and how often you need them. This information is not intended to replace advice given to you by your health care provider. Make sure you discuss any questions you have  with your health care provider. Document Released: 08/11/2015 Document Revised: 04/03/2016 Document Reviewed: 05/16/2015 Elsevier Interactive Patient Education  2017 [employer].

## 2016-11-21 NOTE — Progress Notes (Signed)
Subjective:   Laura [name] is a 76 y.o. female who presents for Medicare Annual (Subsequent) preventive examination.  Review of Systems:  No ROS.  Medicare Wellness Visit.   Cardiac Risk Factors include: advanced age (>10men, >67 women);hypertension  Sleep patterns: no sleep issues, gets up 1-2 times nightly to void and sleeps 9 hours nightly. Neck pain relieved by laying down.  Home Safety/Smoke Alarms: Feels safe in home. Smoke alarms in place.   Living environment; residence and Firearm Safety: Lives w/ husband. 2-story house, can live on one level, bedroom on first floor, no firearms.   Counseling:   Eye Exam- Dr. Bing Plume yearly Dental- Dr. Delphina Cahill on [address] in [facility] every 8 months  Female:   Pap- N/A, hysterectomy       Mammo- last 05/23/16. BI-RADS CATEGORY  1: Negative.        Dexa scan- last 05/15/15. Osteopenia.        CCS- last 09/19/15. Normal colonoscopy, internal hemorrhoids. 5 year recall.     Objective:     Vitals: BP 138/68   Pulse 84   Temp 97.9 F (36.6 C) (Oral)   Resp 18   Ht 5\' 4"  (1.626 m)   SpO2 98%   There is no height or weight on file to calculate BMI.   Tobacco History  Smoking Status  . Former Smoker  . Packs/day: 1.00  . Years: 16.00  . Types: Cigarettes  . Start date: 07/29/1972  Smokeless Tobacco  . Never Used     Counseling given: Not Answered   Past Medical History:  Diagnosis Date  . 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 Cataract   . Chicken pox as a child  . EBV infection    mono as child  . Fibrocystic breast 05/01/2014  . GERD (gastroesophageal reflux disease)    pt denies  . Hyperlipidemia   . Hypertension 20 yrs ago  . Measles as a child  . Medicare annual wellness visit, subsequent 02/01/2015  . Mumps as a child  . Neck pain 01/22/2016  . Other and unspecified hyperlipidemia 10/13/2013  . Overweight(278.02)   . Vasovagal reaction 07/15/2015   no syncope but close    Past Surgical History:  Procedure  Laterality Date  . ABDOMINAL HYSTERECTOMY  30 yrs ago   heavy bleeding, ovaries left in place  . 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 COLONOSCOPY    . EYE SURGERY  11-27-2010   cataract surgery in both eyes  . POLYPECTOMY    . VAGINAL HYSTERECTOMY  76 yrs old   uterus removed   Family History  Problem Relation Age of Onset  . Heart disease Father   . Heart attack Father 23  . Hypertension Father   . Dementia Maternal Grandmother   . Heart disease Maternal Grandfather   . Hyperlipidemia Paternal Grandmother   . Cancer Paternal Grandfather     stomach  . Heart attack Paternal Grandfather   . Stomach cancer Paternal Grandfather   . Dementia Mother   . Pulmonary embolism Son   . Colon cancer Neg Hx   . Colon polyps Neg Hx   . Rectal cancer Neg Hx    History  Sexual Activity  . Sexual activity: No    Comment: lives with husband no major dietary restrictions    Outpatient Encounter Prescriptions as of 11/21/2016  Medication Sig  . Fiber, Guar Gum, CHEW Chew by  mouth as needed.  Marland Kitchen losartan-hydrochlorothiazide (HYZAAR) 50-12.5 MG tablet TAKE 1 TABLET BY MOUTH DAILY.  . Multiple Vitamins-Minerals (MULTI-VITAMIN GUMMIES) CHEW Chew 1 tablet by mouth daily.  . naproxen (NAPROSYN) 500 MG tablet Take 1 tablet by mouth as needed.  Marland Kitchen tiZANidine (ZANAFLEX) 4 MG tablet Take 0.5-1 tablets by mouth 3 (three) times daily as needed for spasms.  . [DISCONTINUED] tiZANidine (ZANAFLEX) 2 MG tablet Take 1 tablet (2 mg total) by mouth 2 (two) times daily as needed for muscle spasms.  . [DISCONTINUED] losartan-hydrochlorothiazide (HYZAAR) 50-12.5 MG tablet TAKE 1 TABLET BY MOUTH EVERY DAY   No facility-administered encounter medications on file as of 11/21/2016.     Activities of Daily Living In your present state of health, do you have any difficulty performing the following activities: 11/21/2016 01/22/2016  Hearing? N N  Vision? N N  Difficulty  concentrating or making decisions? N N  Walking or climbing stairs? N N  Dressing or bathing? N N  Doing errands, shopping? N N  Preparing Food and eating ? N -  Using the Toilet? N -  Managing your Medications? N -  Managing your Finances? N -  Housekeeping or managing your Housekeeping? N -  Some recent data might be hidden    Patient Care Team: [name], MD as PCP - General (Family Medicine) Calvert Cantor, MD as Consulting Physician (Ophthalmology) Laroy Apple, MD as Consulting Physician (Physical Medicine and Rehabilitation) Ashok Pall, MD as Consulting Physician (Neurosurgery) Almedia Balls, MD as Consulting Physician (Orthopedic Surgery)    Assessment:    Physical assessment deferred to PCP.  Exercise Activities and Dietary recommendations Current Exercise Habits: The patient does not participate in regular exercise at present, Exercise limited by: orthopedic condition(s) (chronic neck pain, arthritis). She stays active doing chores around the house and running errands.  Diet (meal preparation, eat out, water intake, caffeinated beverages, dairy products, fruits and vegetables): well balanced, on average, 2-2 meals per day. Eats very little breakfast. Often goes out for lunch. Does not cook much anymore. Shares meal w/ husband if going out to eat. Does not drink enough water. Drinks 1 bottle Diet Coke daily.      Goals    . Increase water intake      Fall Risk Fall Risk  11/21/2016 01/22/2016 01/17/2015  Falls in the past year? Yes No No  Number falls in past yr: 1 - -  Injury with Fall? No - -   Depression Screen PHQ 2/9 Scores 11/21/2016 01/22/2016 01/17/2015  PHQ - 2 Score 0 0 0     Cognitive Function MMSE - Mini Mental State Exam 11/21/2016  Orientation to time 5  Orientation to Place 5  Registration 3  Attention/ Calculation 5  Recall 3  Language- name 2 objects 2  Language- repeat 1  Language- follow 3 step command 3  Language- read & follow direction  1  Write a sentence 1  Copy design 1  Total score 30        Immunization History  Administered Date(s) Administered  . Influenza, High Dose Seasonal PF 05/07/2016  . Influenza,inj,Quad PF,36+ Mos 04/29/2014, 07/10/2015  . Influenza-Unspecified 04/28/2013  . Pneumococcal Conjugate-13 04/29/2014  . Pneumococcal Polysaccharide-23 04/28/2013  . Td 07/29/2010  . Zoster 04/28/2013   Screening Tests Health Maintenance  Topic Date Due  . INFLUENZA VACCINE  02/26/2017  . MAMMOGRAM  05/23/2018  . TETANUS/TDAP  07/29/2020  . COLONOSCOPY  09/18/2020  . DEXA SCAN  Completed  .  PNA vac Low Risk Adult  Completed      Plan:    Follow-up w/ PCP as directed.  Bring a copy of your advance directives to your next office visit.  Eat heart healthy diet (full of fruits, vegetables, whole grains, lean protein, water--limit salt, fat, and sugar intake) and increase physical activity as tolerated.  During the course of the visit the patient was educated and counseled about the following appropriate screening and preventive services:   Vaccines to include Pneumococcal, Influenza, Td, HCV  Cardiovascular Disease  Colorectal cancer screening  Bone density screening  Diabetes screening  Glaucoma screening  Mammography  Nutrition counseling   Patient Instructions (the written plan) was given to the patient.   Dorrene German, RN  11/21/2016

## 2016-11-21 NOTE — Progress Notes (Signed)
Pre visit review using our clinic review tool, if applicable. No additional management support is needed unless otherwise documented below in the visit note. 

## 2016-11-21 NOTE — Progress Notes (Signed)
Patient ID: Laura [name], female   DOB: 19-Mar-1941, 76 y.o.   MRN: [id number]   Subjective:  I acted as a Education administrator for Penni Homans, [place], [place]   Patient ID: Laura [name], female    DOB: 26-May-1941, 76 y.o.   MRN: [id number]  Chief Complaint  Patient presents with  . Medicare Wellness    w/ RN  . Follow-up    Routine follow-up. No concerns. Seeing PT for cervicalgia.  Marland Kitchen Hypertension    Hypertension  This is a chronic problem. The problem is controlled. Associated symptoms include neck pain. Pertinent negatives include no blurred vision, chest pain, headaches, malaise/fatigue, palpitations or shortness of breath.  Hyperlipidemia  This is a chronic problem. The problem is controlled. Pertinent negatives include no chest pain or shortness of breath.    Patient is in today for a Annual Wellness Visit with the Health Coach to be followed up with the Provider. Patient has a Hx of HTN, Vitamin D Deficiency, hyperlipidemia. Patient has no acute concerns noted at this time. She has persistent neck stiffness and pain but has been following with ortho for this, no recent fall or injury. No recent febrile illness or hospitalizations. Denies CP/palp/SOB/HA/congestion/fevers/GI or GU c/o. Taking meds as prescribed  Patient Care Team: [name], MD as PCP - General (Family Medicine) Calvert Cantor, MD as Consulting Physician (Ophthalmology) Laroy Apple, MD as Consulting Physician (Physical Medicine and Rehabilitation) Ashok Pall, MD as Consulting Physician (Neurosurgery) Almedia Balls, MD as Consulting Physician (Orthopedic Surgery)   Past Medical History:  Diagnosis Date  . Cancer ([place])    basal on back and nose  . Cataract   . Chicken pox as a child  . EBV infection    mono as child  . Fibrocystic breast 05/01/2014  . GERD (gastroesophageal reflux disease)    pt denies  . Hyperlipidemia   . Hypertension 20 yrs ago  . Measles as a child  . Medicare annual wellness visit,  subsequent 02/01/2015  . Mumps as a child  . Neck pain 01/22/2016  . Other and unspecified hyperlipidemia 10/13/2013  . Overweight(278.02)   . Vasovagal reaction 07/15/2015   no syncope but close     Past Surgical History:  Procedure Laterality Date  . ABDOMINAL HYSTERECTOMY  29 yrs ago   heavy bleeding, ovaries left in place  . 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 COLONOSCOPY    . EYE SURGERY  11-27-2010   cataract surgery in both eyes  . POLYPECTOMY    . VAGINAL HYSTERECTOMY  76 yrs old   uterus removed    Family History  Problem Relation Age of Onset  . Heart disease Father   . Heart attack Father 35  . Hypertension Father   . Dementia Maternal Grandmother   . Heart disease Maternal Grandfather   . Hyperlipidemia Paternal Grandmother   . Cancer Paternal Grandfather     stomach  . Heart attack Paternal Grandfather   . Stomach cancer Paternal Grandfather   . Dementia Mother   . Pulmonary embolism Son   . Colon cancer Neg Hx   . Colon polyps Neg Hx   . Rectal cancer Neg Hx     Social History   Social History  . Marital status: Married    Spouse name: N/A  . Number of children: N/A  . Years of education: N/A   Occupational History  . Not on file.   Social History Main Topics  .  Smoking status: Former Smoker    Packs/day: 1.00    Years: 16.00    Types: Cigarettes    Start date: 07/29/1972  . Smokeless tobacco: Never Used  . Alcohol use No  . Drug use: No  . Sexual activity: No     Comment: lives with husband no major dietary restrictions   Other Topics Concern  . Not on file   Social History Narrative  . No narrative on file    Outpatient Medications Prior to Visit  Medication Sig Dispense Refill  . Fiber, Guar Gum, CHEW Chew by mouth as needed.    Marland Kitchen losartan-hydrochlorothiazide (HYZAAR) 50-12.5 MG tablet TAKE 1 TABLET BY MOUTH DAILY. 90 tablet 0  . Multiple Vitamins-Minerals (MULTI-VITAMIN GUMMIES) CHEW  Chew 1 tablet by mouth daily.    Marland Kitchen tiZANidine (ZANAFLEX) 2 MG tablet Take 1 tablet (2 mg total) by mouth 2 (two) times daily as needed for muscle spasms. 30 tablet 0  . losartan-hydrochlorothiazide (HYZAAR) 50-12.5 MG tablet TAKE 1 TABLET BY MOUTH EVERY DAY 90 tablet 2   No facility-administered medications prior to visit.     Allergies  Allergen Reactions  . Lisinopril     dizzy  . Statins     Muscle soreness   . Sulfa Antibiotics Other (See Comments)    "feels feverish"  . Tetracyclines & Related     GI upset    Review of Systems  Constitutional: Negative for fever and malaise/fatigue.  HENT: Negative for congestion.   Eyes: Negative for blurred vision.  Respiratory: Negative for cough and shortness of breath.   Cardiovascular: Negative for chest pain, palpitations and leg swelling.  Gastrointestinal: Negative for vomiting.  Musculoskeletal: Positive for neck pain. Negative for back pain.  Skin: Negative for rash.  Neurological: Negative for loss of consciousness and headaches.       Objective:    Physical Exam  Constitutional: She is oriented to person, place, and time. She appears well-developed and well-nourished. No distress.  HENT:  Head: Normocephalic and atraumatic.  Eyes: Conjunctivae are normal.  Neck: Normal range of motion. No thyromegaly present.  Cardiovascular: Normal rate and regular rhythm.   Pulmonary/Chest: Effort normal and breath sounds normal. She has no wheezes.  Abdominal: Soft. Bowel sounds are normal. There is no tenderness.  Musculoskeletal: She exhibits no edema or deformity.  Neurological: She is alert and oriented to person, place, and time.  Skin: Skin is warm and dry. She is not diaphoretic.  Psychiatric: She has a normal mood and affect.    BP 138/68   Pulse 84   Temp 97.9 F (36.6 C) (Oral)   Resp 18   Ht 5\' 4"  (1.626 m)   SpO2 98%  Wt Readings from Last 3 Encounters:  07/15/16 165 lb (74.8 kg)  05/21/16 169 lb (76.7 kg)    09/19/15 173 lb (78.5 kg)   BP Readings from Last 3 Encounters:  11/21/16 138/68  07/15/16 150/86  05/21/16 120/80     Immunization History  Administered Date(s) Administered  . Influenza, High Dose Seasonal PF 05/07/2016  . Influenza,inj,Quad PF,36+ Mos 04/29/2014, 07/10/2015  . Influenza-Unspecified 04/28/2013  . Pneumococcal Conjugate-13 04/29/2014  . Pneumococcal Polysaccharide-23 04/28/2013  . Td 07/29/2010  . Zoster 04/28/2013    Health Maintenance  Topic Date Due  . INFLUENZA VACCINE  02/26/2017  . MAMMOGRAM  05/23/2018  . TETANUS/TDAP  07/29/2020  . COLONOSCOPY  09/18/2020  . DEXA SCAN  Completed  . PNA vac Low Risk Adult  Completed    Lab Results  Component Value Date   WBC 6.7 11/21/2016   HGB 13.9 11/21/2016   HCT 41.4 11/21/2016   PLT 299.0 11/21/2016   GLUCOSE 88 11/21/2016   CHOL 258 (H) 11/21/2016   TRIG 172.0 (H) 11/21/2016   HDL 48.50 11/21/2016   LDLCALC 175 (H) 11/21/2016   ALT 12 11/21/2016   AST 15 11/21/2016   NA 135 11/21/2016   K 4.2 11/21/2016   CL 102 11/21/2016   CREATININE 0.87 11/21/2016   BUN 24 (H) 11/21/2016   CO2 27 11/21/2016   TSH 2.65 11/21/2016    Lab Results  Component Value Date   TSH 2.65 11/21/2016   Lab Results  Component Value Date   WBC 6.7 11/21/2016   HGB 13.9 11/21/2016   HCT 41.4 11/21/2016   MCV 88.0 11/21/2016   PLT 299.0 11/21/2016   Lab Results  Component Value Date   NA 135 11/21/2016   K 4.2 11/21/2016   CO2 27 11/21/2016   GLUCOSE 88 11/21/2016   BUN 24 (H) 11/21/2016   CREATININE 0.87 11/21/2016   BILITOT 0.3 11/21/2016   ALKPHOS 48 11/21/2016   AST 15 11/21/2016   ALT 12 11/21/2016   PROT 7.2 11/21/2016   ALBUMIN 4.2 11/21/2016   CALCIUM 9.8 11/21/2016   GFR 67.55 11/21/2016   Lab Results  Component Value Date   CHOL 258 (H) 11/21/2016   Lab Results  Component Value Date   HDL 48.50 11/21/2016   Lab Results  Component Value Date   LDLCALC 175 (H) 11/21/2016   Lab  Results  Component Value Date   TRIG 172.0 (H) 11/21/2016   Lab Results  Component Value Date   CHOLHDL 5 11/21/2016   No results found for: HGBA1C       Assessment & Plan:   Problem List Items Addressed This Visit    Overweight    Encouraged DASH diet, decrease po intake and increase exercise as tolerated. Needs 7-8 hours of sleep nightly. Avoid trans fats, eat small, frequent meals every 4-5 hours with lean proteins, complex carbs and healthy fats. Minimize simple carbs      Vitamin D deficiency    Taking daily supplements, misses doses, is in the sun prn check level today      Relevant Orders   VITAMIN D 25 Hydroxy (Vit-D Deficiency, Fractures) (Completed)   HTN (hypertension)    Well controlled, no changes to meds. Encouraged heart healthy diet such as the DASH diet and exercise as tolerated.       Relevant Medications   atorvastatin (LIPITOR) 10 MG tablet   Other Relevant Orders   CBC (Completed)   Comprehensive metabolic panel (Completed)   TSH (Completed)   Hot flashes    Wakes in night still on occasion and has some symptoms during the day. Lasts 45 seconds and then resolves. Will watch for patterns such as long time without eating      Relevant Medications   atorvastatin (LIPITOR) 10 MG tablet   Hyperlipidemia    Encouraged heart healthy diet, increase exercise, avoid trans fats, consider a krill oil cap daily      Relevant Medications   atorvastatin (LIPITOR) 10 MG tablet   Other Relevant Orders   Lipid panel (Completed)   Medicare annual wellness visit, subsequent           Neck pain    Encouraged moist heat and gentle stretching as tolerated. May try NSAIDs and prescription meds  as directed and report if symptoms worsen or seek immediate care. Try topical lidocaine and follow up with ortho and or Neurosurgery if symptoms worsen.       Other Visit Diagnoses    Encounter for Medicare annual wellness exam    -  Primary   Preventative health care           I am having Ms. Bovee start on atorvastatin. I am also having her maintain her Fiber (Guar Gum), MULTI-VITAMIN GUMMIES, losartan-hydrochlorothiazide, naproxen, and tiZANidine.  Meds ordered this encounter  Medications  . naproxen (NAPROSYN) 500 MG tablet    Sig: Take 1 tablet by mouth as needed.    Refill:  1  . tiZANidine (ZANAFLEX) 4 MG tablet    Sig: Take 0.5-1 tablets by mouth 3 (three) times daily as needed for spasms.    Refill:  1  . atorvastatin (LIPITOR) 10 MG tablet    Sig: Take 1 tablet (10 mg total) by mouth at bedtime.    Dispense:  90 tablet    Refill:  3    CMA served as scribe during this visit. History, Physical and Plan performed by medical provider. Documentation and orders reviewed and attested to.  Penni Homans, MD

## 2016-11-21 NOTE — Assessment & Plan Note (Signed)
Well controlled, no changes to meds. Encouraged heart healthy diet such as the DASH diet and exercise as tolerated.  °

## 2016-11-21 NOTE — Assessment & Plan Note (Signed)
Wakes in night still on occasion and has some symptoms during the day. Lasts 45 seconds and then resolves. Will watch for patterns such as long time without eating

## 2016-11-21 NOTE — Assessment & Plan Note (Signed)
Encouraged heart healthy diet, increase exercise, avoid trans fats, consider a krill oil cap daily 

## 2016-11-21 NOTE — Assessment & Plan Note (Signed)
Taking daily supplements, misses doses, is in the sun prn check level today

## 2016-11-22 ENCOUNTER — Encounter: Payer: Self-pay | Admitting: Family Medicine

## 2016-11-22 MED ORDER — ATORVASTATIN CALCIUM 10 MG PO TABS: 10.0000 mg | ORAL_TABLET | Freq: Every day | ORAL | 3 refills | Status: DC

## 2016-11-24 NOTE — Assessment & Plan Note (Signed)
Encouraged DASH diet, decrease po intake and increase exercise as tolerated. Needs 7-8 hours of sleep nightly. Avoid trans fats, eat small, frequent meals every 4-5 hours with lean proteins, complex carbs and healthy fats. Minimize simple carbs 

## 2016-11-24 NOTE — Assessment & Plan Note (Signed)
Encouraged moist heat and gentle stretching as tolerated. May try NSAIDs and prescription meds as directed and report if symptoms worsen or seek immediate care. Try topical lidocaine and follow up with ortho and or Neurosurgery if symptoms worsen.

## 2016-11-26 ENCOUNTER — Ambulatory Visit: Payer: Medicare Other | Admitting: Physical Therapy

## 2016-11-26 ENCOUNTER — Encounter: Payer: Self-pay | Admitting: Family Medicine

## 2016-11-26 ENCOUNTER — Ambulatory Visit (INDEPENDENT_AMBULATORY_CARE_PROVIDER_SITE_OTHER): Payer: Medicare Other | Admitting: Family Medicine

## 2016-11-26 VITALS — BP 122/76 | HR 121 | Temp 97.9°F | Resp 16

## 2016-11-26 DIAGNOSIS — R1013 Epigastric pain: Secondary | ICD-10-CM | POA: Diagnosis not present

## 2016-11-26 MED ORDER — RANITIDINE HCL 150 MG PO TABS: 150.0000 mg | ORAL_TABLET | Freq: Two times a day (BID) | ORAL | 0 refills | Status: DC

## 2016-11-26 NOTE — Progress Notes (Signed)
Pre visit review using our clinic review tool, if applicable. No additional management support is needed unless otherwise documented below in the visit note. 

## 2016-11-26 NOTE — Patient Instructions (Signed)
Food Choices for Gastroesophageal Reflux Disease, Adult When you have gastroesophageal reflux disease (GERD), the foods you eat and your eating habits are very important. Choosing the right foods can help ease your discomfort. What guidelines do I need to follow?  Choose fruits, vegetables, whole grains, and low-fat dairy products.  Choose low-fat meat, fish, and poultry.  Limit fats such as oils, salad dressings, butter, nuts, and avocado.  Keep a food diary. This helps you identify foods that cause symptoms.  Avoid foods that cause symptoms. These may be different for everyone.  Eat small meals often instead of 3 large meals a day.  Eat your meals slowly, in a place where you are relaxed.  Limit fried foods.  Cook foods using methods other than frying.  Avoid drinking alcohol.  Avoid drinking large amounts of liquids with your meals.  Avoid bending over or lying down until 2-3 hours after eating. What foods are not recommended? These are some foods and drinks that may make your symptoms worse: Vegetables  Tomatoes. Tomato juice. Tomato and spaghetti sauce. Chili peppers. Onion and garlic. Horseradish. Fruits  Oranges, grapefruit, and lemon (fruit and juice). Meats  High-fat meats, fish, and poultry. This includes hot dogs, ribs, ham, sausage, salami, and bacon. Dairy  Whole milk and chocolate milk. Sour cream. Cream. Butter. Ice cream. Cream cheese. Drinks  Coffee and tea. Bubbly (carbonated) drinks or energy drinks. Condiments  Hot sauce. Barbecue sauce. Sweets/Desserts  Chocolate and cocoa. Donuts. Peppermint and spearmint. Fats and Oils  High-fat foods. This includes French fries and potato chips. Other  Vinegar. Strong spices. This includes black pepper, white pepper, red pepper, cayenne, curry powder, cloves, ginger, and chili powder. The items listed above may not be a complete list of foods and drinks to avoid. Contact your dietitian for more information.    This information is not intended to replace advice given to you by your health care provider. Make sure you discuss any questions you have with your health care provider. Document Released: 01/14/2012 Document Revised: 12/21/2015 Document Reviewed: 05/19/2013 Elsevier Interactive Patient Education  2017 Elsevier Inc.  

## 2016-11-26 NOTE — Progress Notes (Signed)
Patient ID: Laura [name], female   DOB: 30-Apr-1941, 76 y.o.   MRN: [id number]     Subjective:    Patient ID: Laura [name], female    DOB: Aug 19, 1940, 76 y.o.   MRN: [id number]  Chief Complaint  Patient presents with  . Nausea    loss of appetite.  belching a lot.      HPI Patient is in today for nausea.  She also has a lot of belching.  No pain, she just feel sick on the stomach.  She had vertigo also over weekend but that subsided.  No abd pain--- just a "gnawing" sensation.   She took 1 tums with no relief.   No fever    Past Medical History:  Diagnosis Date  . Cancer ([place])    basal on back and nose  . Cataract   . Chicken pox as a child  . EBV infection    mono as child  . Fibrocystic breast 05/01/2014  . GERD (gastroesophageal reflux disease)    pt denies  . Hyperlipidemia   . Hypertension 20 yrs ago  . Measles as a child  . Medicare annual wellness visit, subsequent 02/01/2015  . Mumps as a child  . Neck pain 01/22/2016  . Other and unspecified hyperlipidemia 10/13/2013  . Overweight(278.02)   . Vasovagal reaction 07/15/2015   no syncope but close     Past Surgical History:  Procedure Laterality Date  . ABDOMINAL HYSTERECTOMY  29 yrs ago   heavy bleeding, ovaries left in place  . 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 COLONOSCOPY    . EYE SURGERY  11-27-2010   cataract surgery in both eyes  . POLYPECTOMY    . VAGINAL HYSTERECTOMY  76 yrs old   uterus removed    Family History  Problem Relation Age of Onset  . Heart disease Father   . Heart attack Father 80  . Hypertension Father   . Dementia Maternal Grandmother   . Heart disease Maternal Grandfather   . Hyperlipidemia Paternal Grandmother   . Cancer Paternal Grandfather     stomach  . Heart attack Paternal Grandfather   . Stomach cancer Paternal Grandfather   . Dementia Mother   . Pulmonary embolism Son   . Colon cancer Neg Hx   . Colon polyps Neg Hx     . Rectal cancer Neg Hx     Social History   Social History  . Marital status: Married    Spouse name: N/A  . Number of children: N/A  . Years of education: N/A   Occupational History  . Not on file.   Social History Main Topics  . Smoking status: Former Smoker    Packs/day: 1.00    Years: 16.00    Types: Cigarettes    Start date: 07/29/1972  . Smokeless tobacco: Never Used  . Alcohol use No  . Drug use: No  . Sexual activity: No     Comment: lives with husband no major dietary restrictions   Other Topics Concern  . Not on file   Social History Narrative  . No narrative on file    Outpatient Medications Prior to Visit  Medication Sig Dispense Refill  . Fiber, Guar Gum, CHEW Chew by mouth as needed.    Marland Kitchen losartan-hydrochlorothiazide (HYZAAR) 50-12.5 MG tablet TAKE 1 TABLET BY MOUTH DAILY. 90 tablet 0  . Multiple Vitamins-Minerals (MULTI-VITAMIN GUMMIES) CHEW Chew 1 tablet by mouth daily.    Marland Kitchen  naproxen (NAPROSYN) 500 MG tablet Take 1 tablet by mouth as needed.  1  . tiZANidine (ZANAFLEX) 4 MG tablet Take 0.5-1 tablets by mouth 3 (three) times daily as needed for spasms.  1  . atorvastatin (LIPITOR) 10 MG tablet Take 1 tablet (10 mg total) by mouth at bedtime. 90 tablet 3   No facility-administered medications prior to visit.     Allergies  Allergen Reactions  . Lisinopril     dizzy  . Statins     Muscle soreness   . Sulfa Antibiotics Other (See Comments)    "feels feverish"  . Tetracyclines & Related     GI upset    Review of Systems  Constitutional: Negative for fever and malaise/fatigue.  HENT: Negative for congestion.   Eyes: Negative for blurred vision.  Respiratory: Negative for cough and shortness of breath.   Cardiovascular: Negative for chest pain, palpitations and leg swelling.  Gastrointestinal: Positive for nausea. Negative for vomiting.       Belching   Musculoskeletal: Negative for back pain.  Skin: Negative for rash.  Neurological:  Negative for loss of consciousness and headaches.       Objective:    Physical Exam  Constitutional: She is oriented to person, place, and time. She appears well-developed and well-nourished. No distress.  HENT:  Head: Normocephalic and atraumatic.  Eyes: Conjunctivae are normal.  Neck: Normal range of motion. No thyromegaly present.  Cardiovascular: Normal rate and regular rhythm.   Pulmonary/Chest: Effort normal and breath sounds normal. She has no wheezes.  Abdominal: Soft. Bowel sounds are normal. There is tenderness in the epigastric area.    Musculoskeletal: Normal range of motion. She exhibits no edema or deformity.  Neurological: She is alert and oriented to person, place, and time.  Skin: Skin is warm and dry. She is not diaphoretic.  Psychiatric: She has a normal mood and affect.  Nursing note and vitals reviewed.   BP 122/76 (BP Location: Left Arm, Cuff Size: Normal)   Pulse (!) 121   Temp 97.9 F (36.6 C) (Oral)   Resp 16   SpO2 96%  Wt Readings from Last 3 Encounters:  07/15/16 165 lb (74.8 kg)  05/21/16 169 lb (76.7 kg)  09/19/15 173 lb (78.5 kg)     Lab Results  Component Value Date   WBC 6.7 11/21/2016   HGB 13.9 11/21/2016   HCT 41.4 11/21/2016   PLT 299.0 11/21/2016   GLUCOSE 88 11/21/2016   CHOL 258 (H) 11/21/2016   TRIG 172.0 (H) 11/21/2016   HDL 48.50 11/21/2016   LDLCALC 175 (H) 11/21/2016   ALT 12 11/21/2016   AST 15 11/21/2016   NA 135 11/21/2016   K 4.2 11/21/2016   CL 102 11/21/2016   CREATININE 0.87 11/21/2016   BUN 24 (H) 11/21/2016   CO2 27 11/21/2016   TSH 2.65 11/21/2016    Lab Results  Component Value Date   TSH 2.65 11/21/2016   Lab Results  Component Value Date   WBC 6.7 11/21/2016   HGB 13.9 11/21/2016   HCT 41.4 11/21/2016   MCV 88.0 11/21/2016   PLT 299.0 11/21/2016   Lab Results  Component Value Date   NA 135 11/21/2016   K 4.2 11/21/2016   CO2 27 11/21/2016   GLUCOSE 88 11/21/2016   BUN 24 (H)  11/21/2016   CREATININE 0.87 11/21/2016   BILITOT 0.3 11/21/2016   ALKPHOS 48 11/21/2016   AST 15 11/21/2016   ALT 12 11/21/2016  PROT 7.2 11/21/2016   ALBUMIN 4.2 11/21/2016   CALCIUM 9.8 11/21/2016   GFR 67.55 11/21/2016   Lab Results  Component Value Date   CHOL 258 (H) 11/21/2016   Lab Results  Component Value Date   HDL 48.50 11/21/2016   Lab Results  Component Value Date   LDLCALC 175 (H) 11/21/2016   Lab Results  Component Value Date   TRIG 172.0 (H) 11/21/2016   Lab Results  Component Value Date   CHOLHDL 5 11/21/2016   No results found for: HGBA1C     Assessment & Plan:   Problem List Items Addressed This Visit    None    Visit Diagnoses    Dyspepsia    -  Primary   Relevant Medications   ranitidine (ZANTAC) 150 MG tablet   Other Relevant Orders   CBC with Differential/Platelet   Comprehensive metabolic panel   H. pylori antibody, IgG    hold naprosyn for now  rto prn  I have discontinued Ms. Archbold's atorvastatin. I am also having her start on ranitidine. Additionally, I am having her maintain her Fiber (Guar Gum), MULTI-VITAMIN GUMMIES, losartan-hydrochlorothiazide, naproxen, and tiZANidine.  Meds ordered this encounter  Medications  . ranitidine (ZANTAC) 150 MG tablet    Sig: Take 1 tablet (150 mg total) by mouth 2 (two) times daily.    Dispense:  60 tablet    Refill:  0     Ann Held, DO

## 2016-11-27 LAB — COMPREHENSIVE METABOLIC PANEL
ALT: 10 U/L (ref 0–35)
AST: 14 U/L (ref 0–37)
Albumin: 4.4 g/dL (ref 3.5–5.2)
Alkaline Phosphatase: 50 U/L (ref 39–117)
BUN: 27 mg/dL — ABNORMAL HIGH (ref 6–23)
CO2: 29 mEq/L (ref 19–32)
Calcium: 10.6 mg/dL — ABNORMAL HIGH (ref 8.4–10.5)
Chloride: 98 mEq/L (ref 96–112)
Creatinine, Ser: 1.04 mg/dL (ref 0.40–1.20)
GFR: 54.98 mL/min — ABNORMAL LOW (ref >60.00)
Glucose, Bld: 97 mg/dL (ref 70–99)
Potassium: 4.2 mEq/L (ref 3.5–5.1)
Sodium: 134 mEq/L — ABNORMAL LOW (ref 135–145)
Total Bilirubin: 0.3 mg/dL (ref 0.2–1.2)
Total Protein: 7.3 g/dL (ref 6.0–8.3)

## 2016-11-27 LAB — CBC WITH DIFFERENTIAL/PLATELET
Basophils Absolute: 0.1 10*3/uL (ref 0.0–0.1)
Basophils Relative: 1 % (ref 0.0–3.0)
Eosinophils Absolute: 0.2 10*3/uL (ref 0.0–0.7)
Eosinophils Relative: 2.4 % (ref 0.0–5.0)
HCT: 42.7 % (ref 36.0–46.0)
Hemoglobin: 14.3 g/dL (ref 12.0–15.0)
Lymphocytes Relative: 27.6 % (ref 12.0–46.0)
Lymphs Abs: 2.3 10*3/uL (ref 0.7–4.0)
MCHC: 33.4 g/dL (ref 30.0–36.0)
MCV: 88 fl (ref 78.0–100.0)
Monocytes Absolute: 0.7 10*3/uL (ref 0.1–1.0)
Monocytes Relative: 8.8 % (ref 3.0–12.0)
Neutro Abs: 5 10*3/uL (ref 1.4–7.7)
Neutrophils Relative %: 60.2 % (ref 43.0–77.0)
Platelets: 353 10*3/uL (ref 150.0–400.0)
RBC: 4.86 Mil/uL (ref 3.87–5.11)
RDW: 13.1 % (ref 11.5–15.5)
WBC: 8.4 10*3/uL (ref 4.0–10.5)

## 2016-11-27 LAB — H. PYLORI ANTIBODY, IGG: H Pylori IgG: NEGATIVE

## 2016-11-28 ENCOUNTER — Other Ambulatory Visit: Payer: Self-pay | Admitting: Family Medicine

## 2016-11-28 ENCOUNTER — Ambulatory Visit: Payer: Medicare Other | Admitting: Physical Therapy

## 2016-11-28 ENCOUNTER — Encounter: Payer: Self-pay | Admitting: Family Medicine

## 2016-11-29 ENCOUNTER — Other Ambulatory Visit: Payer: Medicare Other

## 2016-11-29 ENCOUNTER — Other Ambulatory Visit (INDEPENDENT_AMBULATORY_CARE_PROVIDER_SITE_OTHER): Payer: Medicare Other

## 2016-12-02 ENCOUNTER — Telehealth: Payer: Self-pay | Admitting: Family Medicine

## 2016-12-02 DIAGNOSIS — M5481 Occipital neuralgia: Secondary | ICD-10-CM | POA: Diagnosis not present

## 2016-12-02 DIAGNOSIS — M47812 Spondylosis without myelopathy or radiculopathy, cervical region: Secondary | ICD-10-CM | POA: Diagnosis not present

## 2016-12-02 DIAGNOSIS — M542 Cervicalgia: Secondary | ICD-10-CM | POA: Diagnosis not present

## 2016-12-02 LAB — PTH, INTACT AND CALCIUM
Calcium: 9.5 mg/dL (ref 8.6–10.4)
PTH: 56 pg/mL (ref 14–64)

## 2016-12-02 NOTE — Telephone Encounter (Signed)
Was it for the P[facility]? If so that's a [facility] send out test and they can be 5 to 7 day TAT. Some test's take longer than that.

## 2016-12-02 NOTE — Telephone Encounter (Signed)
The lab done on 11/29/16 has not yet resulted --can you help with this

## 2016-12-02 NOTE — Telephone Encounter (Signed)
Caller name: [facility]  Relation to pt: self  Call back number: [phone] Pharmacy:  Reason for call:  Pt was calling to get her lab results, pt states tried to verify her results threw mychart and the system was not working, so pt wanted to know if possible to call her with her results at the tel # mentioned above. Please advise.

## 2016-12-03 NOTE — Telephone Encounter (Signed)
Has been resulted thanks

## 2016-12-04 ENCOUNTER — Ambulatory Visit: Payer: Medicare Other | Attending: Physical Medicine and Rehabilitation | Admitting: Physical Therapy

## 2016-12-04 DIAGNOSIS — R293 Abnormal posture: Secondary | ICD-10-CM | POA: Diagnosis not present

## 2016-12-04 DIAGNOSIS — M542 Cervicalgia: Secondary | ICD-10-CM | POA: Diagnosis not present

## 2016-12-04 NOTE — Therapy (Signed)
[facility] [address]  [place], [place], [address] Phone: [phone]   Fax:  [phone]  Physical Therapy Treatment  Patient Details  Name: Laura [name] MRN: [id number] Date of Birth: 09/29/1940 Referring Provider: Dr. Laroy Apple  Encounter Date: 12/04/2016      PT End of Session - 12/04/16 0951    Visit Number 2   Number of Visits 12   Date for PT Re-Evaluation 12/31/16   Authorization Type Medicare   PT Start Time 0925   PT Stop Time 1008   PT Time Calculation (min) 43 min   Activity Tolerance Patient tolerated treatment well   Behavior During Therapy [facility] for tasks assessed/performed      Past Medical History:  Diagnosis Date  . 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 Cataract   . Chicken pox as a child  . EBV infection    mono as child  . Fibrocystic breast 05/01/2014  . GERD (gastroesophageal reflux disease)    pt denies  . Hyperlipidemia   . Hypertension 20 yrs ago  . Measles as a child  . Medicare annual wellness visit, subsequent 02/01/2015  . Mumps as a child  . Neck pain 01/22/2016  . Other and unspecified hyperlipidemia 10/13/2013  . Overweight(278.02)   . Vasovagal reaction 07/15/2015   no syncope but close     Past Surgical History:  Procedure Laterality Date  . ABDOMINAL HYSTERECTOMY  29 yrs ago   heavy bleeding, ovaries left in place  . 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 COLONOSCOPY    . EYE SURGERY  11-27-2010   cataract surgery in both eyes  . POLYPECTOMY    . VAGINAL HYSTERECTOMY  76 yrs old   uterus removed    There were no vitals filed for this visit.      Subjective Assessment - 12/04/16 0926    Subjective Ionto patch worked for two hours - recently seen by MDs due to allergic reaction vs ulcer   Diagnostic tests MRI - 2017 - see chart   Patient Stated Goals "I'd like to not have any pain."   Currently in Pain?  Yes   Pain Score 4    Pain Location Neck   Pain Orientation Right;Left;Posterior   Pain Descriptors / Indicators Tightness;Aching   Pain Type Chronic pain                         [facility] Adult PT Treatment/Exercise - 12/04/16 0001      Neck Exercises: Theraband   Rows 15 reps;Red   Rows Limitations with scap squeeze   Horizontal ABduction 15 reps;Red   Horizontal ABduction Limitations with scap squeeze     Iontophoresis   Type of Iontophoresis Dexamethasone   Location L lateral cervical region   Dose 1.0 mL   Time 80 mA; 4-6 hour patch     Manual Therapy   Manual Therapy Soft tissue mobilization;Myofascial release   Soft tissue mobilization gentle STM to B upper back and neck musculature - painful to light and deep pressure; STM to anterior/posterior shoulder complex   Myofascial Release Manual trigger point release to B UT, B cervical/thoracic paraspinals, as well as rhomboid/LS area     Neck Exercises: Stretches   Upper Trapezius Stretch 2 reps;20 seconds   Upper Trapezius Stretch Limitations bilateral  PT Long Term Goals - 12/04/16 1231      PT LONG TERM GOAL #1   Title Patient to be independent with HEP for cervical ROM/stretching and postural re-ed (12/31/16)   Status On-going     PT LONG TERM GOAL #2   Title Patient to improve AROM of cervical rotation to [facility] and symmetrical, as well as lateral flexion by 10 degrees and symmetrical (12/31/16)   Status On-going     PT LONG TERM GOAL #3   Title Patient to demonstrate good postural awareness with ability to self-correct (12/31/16)   Status On-going     PT LONG TERM GOAL #4   Title Patient to report ability to perform household tasks and ADLs without neck pain limiting function (12/31/16)   Status On-going               Plan - 12/04/16 1231    Clinical Impression Statement Laura [name] today reporting two hour pain relief from ionto patch at last session - would like to  continue today for pain relief with PT in agreement. Patient continues to demonstrate a great amount of tightness throughout B UT, B paraspinals, B suboccipital mm, as well as anterior and posterior shoulder complexes. TIme spent today on STM with patinet tender with light and deep pressure. Updated HEP given today for periscapular strenghtening with good carryover.    PT Treatment/Interventions ADLs/Self Care Home Management;Cryotherapy;Electrical Stimulation;Iontophoresis 4mg /ml Dexamethasone;Moist Heat;Traction;Ultrasound;Neuromuscular re-education;Therapeutic exercise;Therapeutic activities;Patient/family education;Manual techniques;Passive range of motion;Vasopneumatic Device;Taping;Dry needling   PT Next Visit Plan gentle stretching, ionto and other modailites as indicated   Consulted and Agree with Plan of Care Patient      Patient will benefit from skilled therapeutic intervention in order to improve the following deficits and impairments:  Pain, Increased fascial restricitons, Impaired UE functional use, Decreased activity tolerance, Decreased range of motion  Visit Diagnosis: Cervicalgia  Abnormal posture     Problem List Patient Active Problem List   Diagnosis Date Noted  . Neck pain 01/22/2016  . Vasovagal reaction 07/15/2015  . History of colonic polyps 07/15/2015  . Medicare annual wellness visit, subsequent 02/01/2015  . Muscle strain 08/11/2014  . Hyperlipidemia 07/18/2014  . Fibrocystic breast 05/01/2014  . 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 Decreased visual acuity 01/23/2014  . HTN (hypertension) 01/23/2014  . Sun-damaged skin 01/23/2014  . Hot flashes 01/23/2014  . Recurrent BCC (basal cell carcinoma) 10/13/2013  . Vitamin D deficiency 10/13/2013  . Pain in joint, ankle and foot 10/13/2013  . Cancer ([place])   . Cataract   . EBV infection   . Overweight      Lanney Gins, PT, DPT 12/04/16 12:55 PM   [facility] [address]  [place], [place], [address] Phone: [phone]   Fax:  [phone]  Name: Laura [name] MRN: [id number] Date of Birth: 1941-04-12

## 2016-12-04 NOTE — Patient Instructions (Signed)
Resisted Horizontal Abduction: Bilateral    Sit or stand, tubing in both hands, arms out in front. Keeping arms straight, pinch shoulder blades together and stretch arms out. Repeat __15__ times per set. Do __2__ sets per session.    Resistive Band Rowing    With resistive band anchored in door, grasp both ends. Keeping elbows bent, pull back, squeezing shoulder blades together. Hold _5___ seconds. Repeat _15___ times. Do _2___ sessions per day.

## 2016-12-10 ENCOUNTER — Ambulatory Visit: Payer: Medicare Other | Admitting: Physical Therapy

## 2016-12-10 ENCOUNTER — Ambulatory Visit: Payer: Medicare Other | Admitting: Family Medicine

## 2016-12-10 DIAGNOSIS — M542 Cervicalgia: Secondary | ICD-10-CM

## 2016-12-10 DIAGNOSIS — R293 Abnormal posture: Secondary | ICD-10-CM

## 2016-12-10 NOTE — Therapy (Signed)
[facility] [address]  [place], [place], [address] Phone: [phone]   Fax:  [phone]  Physical Therapy Treatment  Patient Details  Name: Laura [name] MRN: [id number] Date of Birth: 12-09-1940 Referring Provider: Dr. Laroy Apple  Encounter Date: 12/10/2016      PT End of Session - 12/10/16 1111    Visit Number 3   Number of Visits 12   Date for PT Re-Evaluation 12/31/16   Authorization Type Medicare   PT Start Time 1020   PT Stop Time 1100   PT Time Calculation (min) 40 min   Activity Tolerance Patient tolerated treatment well   Behavior During Therapy [facility] for tasks assessed/performed      Past Medical History:  Diagnosis Date  . Cancer ([place])    basal on back and nose  . Cataract   . Chicken pox as a child  . EBV infection    mono as child  . Fibrocystic breast 05/01/2014  . GERD (gastroesophageal reflux disease)    pt denies  . Hyperlipidemia   . Hypertension 20 yrs ago  . Measles as a child  . Medicare annual wellness visit, subsequent 02/01/2015  . Mumps as a child  . Neck pain 01/22/2016  . Other and unspecified hyperlipidemia 10/13/2013  . Overweight(278.02)   . Vasovagal reaction 07/15/2015   no syncope but close     Past Surgical History:  Procedure Laterality Date  . ABDOMINAL HYSTERECTOMY  29 yrs ago   heavy bleeding, ovaries left in place  . 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 COLONOSCOPY    . EYE SURGERY  11-27-2010   cataract surgery in both eyes  . POLYPECTOMY    . VAGINAL HYSTERECTOMY  76 yrs old   uterus removed    There were no vitals filed for this visit.      Subjective Assessment - 12/10/16 1022    Subjective only did exercises once - said when shes in pain she doesn't do anything   Diagnostic tests MRI - 2017 - see chart   Patient Stated Goals "I'd like to not have any pain."   Currently in Pain? Yes   Pain  Score 6    Pain Location Neck   Pain Orientation Right;Left;Posterior   Pain Descriptors / Indicators Aching;Discomfort   Pain Type Chronic pain                         [facility] Adult PT Treatment/Exercise - 12/10/16 0001      Neck Exercises: Theraband   Shoulder External Rotation 15 reps;Red   Shoulder External Rotation Limitations with scap squeeze   Horizontal ABduction 15 reps;Red   Horizontal ABduction Limitations with scap squeeze     Modalities   Modalities Iontophoresis     Iontophoresis   Type of Iontophoresis Dexamethasone   Location L lateral cervical region   Dose 1.0 mL   Time 80 mA; 4-6 hour patch     Manual Therapy   Manual Therapy Soft tissue mobilization;Myofascial release   Soft tissue mobilization STM to posterior cervical mm, B UT, B posterior shoulder capsule, B anterior shoulder, B SCM, B cerival/thoracic paraspinals   Myofascial Release manual trigger point throughout per patient tolerance - pain and guarding with deep pressure                     PT Long  Term Goals - 12/04/16 1231      PT LONG TERM GOAL #1   Title Patient to be independent with HEP for cervical ROM/stretching and postural re-ed (12/31/16)   Status On-going     PT LONG TERM GOAL #2   Title Patient to improve AROM of cervical rotation to [facility] and symmetrical, as well as lateral flexion by 10 degrees and symmetrical (12/31/16)   Status On-going     PT LONG TERM GOAL #3   Title Patient to demonstrate good postural awareness with ability to self-correct (12/31/16)   Status On-going     PT LONG TERM GOAL #4   Title Patient to report ability to perform household tasks and ADLs without neck pain limiting function (12/31/16)   Status On-going               Plan - 12/10/16 1111    Clinical Impression Statement Sacha continuing to have high pain levels - repors only doing HEP 1 time since last session. Did find relief from ionto patch at last session -  continued today. Patient very tender to light and deep STM today with patient tending to be heavily guarded during STM and manual trigger point release. Reports she is returning to MD next week as pain is persisting.    PT Treatment/Interventions ADLs/Self Care Home Management;Cryotherapy;Electrical Stimulation;Iontophoresis 4mg /ml Dexamethasone;Moist Heat;Traction;Ultrasound;Neuromuscular re-education;Therapeutic exercise;Therapeutic activities;Patient/family education;Manual techniques;Passive range of motion;Vasopneumatic Device;Taping;Dry needling   PT Next Visit Plan gentle stretching, ionto and other modailites as indicated   Consulted and Agree with Plan of Care Patient      Patient will benefit from skilled therapeutic intervention in order to improve the following deficits and impairments:  Pain, Increased fascial restricitons, Impaired UE functional use, Decreased activity tolerance, Decreased range of motion  Visit Diagnosis: Cervicalgia  Abnormal posture     Problem List Patient Active Problem List   Diagnosis Date Noted  . Neck pain 01/22/2016  . Vasovagal reaction 07/15/2015  . History of colonic polyps 07/15/2015  . Medicare annual wellness visit, subsequent 02/01/2015  . Muscle strain 08/11/2014  . Hyperlipidemia 07/18/2014  . Fibrocystic breast 05/01/2014  . 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 Decreased visual acuity 01/23/2014  . HTN (hypertension) 01/23/2014  . Sun-damaged skin 01/23/2014  . Hot flashes 01/23/2014  . Recurrent BCC (basal cell carcinoma) 10/13/2013  . Vitamin D deficiency 10/13/2013  . Pain in joint, ankle and foot 10/13/2013  . Cancer ([place])   . Cataract   . EBV infection   . Overweight      Lanney Gins, PT, DPT 12/10/16 11:18 AM   [facility] [address]  [place], [place], [address] Phone: [phone]   Fax:  [phone]  Name: Laura [name] MRN: [id number] Date of  Birth: 05-Apr-1941

## 2016-12-12 ENCOUNTER — Ambulatory Visit: Payer: Medicare Other | Admitting: Physical Therapy

## 2016-12-12 DIAGNOSIS — M542 Cervicalgia: Secondary | ICD-10-CM

## 2016-12-12 DIAGNOSIS — R293 Abnormal posture: Secondary | ICD-10-CM

## 2016-12-12 NOTE — Therapy (Signed)
[facility] [address]  [place], [place], [address] Phone: [phone]   Fax:  [phone]  Physical Therapy Treatment  Patient Details  Name: Laura [name] MRN: [id number] Date of Birth: 03/27/1941 Referring Provider: Dr. Laroy Apple  Encounter Date: 12/12/2016      PT End of Session - 12/12/16 1337    Visit Number 4   Number of Visits 12   Date for PT Re-Evaluation 12/31/16   Authorization Type Medicare   PT Start Time 1020   PT Stop Time 1101   PT Time Calculation (min) 41 min   Activity Tolerance Patient tolerated treatment well   Behavior During Therapy [facility] for tasks assessed/performed      Past Medical History:  Diagnosis Date  . Cancer ([place])    basal on back and nose  . Cataract   . Chicken pox as a child  . EBV infection    mono as child  . Fibrocystic breast 05/01/2014  . GERD (gastroesophageal reflux disease)    pt denies  . Hyperlipidemia   . Hypertension 20 yrs ago  . Measles as a child  . Medicare annual wellness visit, subsequent 02/01/2015  . Mumps as a child  . Neck pain 01/22/2016  . Other and unspecified hyperlipidemia 10/13/2013  . Overweight(278.02)   . Vasovagal reaction 07/15/2015   no syncope but close     Past Surgical History:  Procedure Laterality Date  . ABDOMINAL HYSTERECTOMY  29 yrs ago   heavy bleeding, ovaries left in place  . 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 COLONOSCOPY    . EYE SURGERY  11-27-2010   cataract surgery in both eyes  . POLYPECTOMY    . VAGINAL HYSTERECTOMY  76 yrs old   uterus removed    There were no vitals filed for this visit.      Subjective Assessment - 12/12/16 1334    Subjective feels like the ionto patches are working - not doing HEP due to family crisis   Diagnostic tests MRI - 2017 - see chart   Patient Stated Goals "I'd like to not have any pain."   Currently in Pain? Yes    Pain Score 4    Pain Location Neck   Pain Orientation Right;Left;Posterior   Pain Descriptors / Indicators Aching;Tightness   Pain Type Chronic pain                         [facility] Adult PT Treatment/Exercise - 12/12/16 0001      Neck Exercises: Theraband   Shoulder External Rotation 15 reps;Red   Shoulder External Rotation Limitations with scap squeeze   Horizontal ABduction 15 reps;Red   Horizontal ABduction Limitations with scap squeeze     Neck Exercises: Seated   Neck Retraction 10 reps;5 secs     Iontophoresis   Type of Iontophoresis Dexamethasone   Location L lateral cervical region   Dose 1.0 mL   Time 80 mA; 4-6 hour patch     Manual Therapy   Manual Therapy Soft tissue mobilization;Myofascial release   Manual therapy comments patient sitting, due to not wishing to lie down   Soft tissue mobilization STM to posterior cervical mm, B UT, B posterior shoulder capsule, B anterior shoulder, B SCM, B cerival/thoracic paraspinals   Myofascial Release manual trigger point release to R infraspinatus, rhomboid group - tender to deep pressure  Neck Exercises: Stretches   Upper Trapezius Stretch 3 reps;30 seconds   Upper Trapezius Stretch Limitations bilateral                     PT Long Term Goals - 12/04/16 1231      PT LONG TERM GOAL #1   Title Patient to be independent with HEP for cervical ROM/stretching and postural re-ed (12/31/16)   Status On-going     PT LONG TERM GOAL #2   Title Patient to improve AROM of cervical rotation to [facility] and symmetrical, as well as lateral flexion by 10 degrees and symmetrical (12/31/16)   Status On-going     PT LONG TERM GOAL #3   Title Patient to demonstrate good postural awareness with ability to self-correct (12/31/16)   Status On-going     PT LONG TERM GOAL #4   Title Patient to report ability to perform household tasks and ADLs without neck pain limiting function (12/31/16)   Status On-going                Plan - 12/12/16 1338    Clinical Impression Statement Laura [name] reporting she feels like ionto patches are helping with pain, with limited HEP compliance to this point due to reported family issues. Patient continues to demonstrate increased tissue tension throughout all upper back and cervical musculature with trigger points identified, however only slight improvements due to guarding and tendency to pull away with porlonged pressure of trigger point.    PT Treatment/Interventions ADLs/Self Care Home Management;Cryotherapy;Electrical Stimulation;Iontophoresis 4mg /ml Dexamethasone;Moist Heat;Traction;Ultrasound;Neuromuscular re-education;Therapeutic exercise;Therapeutic activities;Patient/family education;Manual techniques;Passive range of motion;Vasopneumatic Device;Taping;Dry needling   PT Next Visit Plan gentle stretching, ionto and other modailites as indicated   Consulted and Agree with Plan of Care Patient      Patient will benefit from skilled therapeutic intervention in order to improve the following deficits and impairments:  Pain, Increased fascial restricitons, Impaired UE functional use, Decreased activity tolerance, Decreased range of motion  Visit Diagnosis: Cervicalgia  Abnormal posture     Problem List Patient Active Problem List   Diagnosis Date Noted  . Neck pain 01/22/2016  . Vasovagal reaction 07/15/2015  . History of colonic polyps 07/15/2015  . Medicare annual wellness visit, subsequent 02/01/2015  . Muscle strain 08/11/2014  . Hyperlipidemia 07/18/2014  . Fibrocystic breast 05/01/2014  . 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 Decreased visual acuity 01/23/2014  . HTN (hypertension) 01/23/2014  . Sun-damaged skin 01/23/2014  . Hot flashes 01/23/2014  . Recurrent BCC (basal cell carcinoma) 10/13/2013  . Vitamin D deficiency 10/13/2013  . Pain in joint, ankle and foot 10/13/2013  . Cancer ([place])   . Cataract   . EBV infection   . Overweight       Lanney Gins, PT, DPT 12/12/16 1:42 PM   [facility] [address]  [place], [place], [address] Phone: [phone]   Fax:  [phone]  Name: Laura [name] MRN: [id number] Date of Birth: June 06, 1941

## 2016-12-17 ENCOUNTER — Ambulatory Visit: Payer: Medicare Other | Admitting: Physical Therapy

## 2016-12-17 DIAGNOSIS — I10 Essential (primary) hypertension: Secondary | ICD-10-CM | POA: Diagnosis not present

## 2016-12-17 DIAGNOSIS — M47892 Other spondylosis, cervical region: Secondary | ICD-10-CM | POA: Diagnosis not present

## 2016-12-17 DIAGNOSIS — M542 Cervicalgia: Secondary | ICD-10-CM | POA: Diagnosis not present

## 2016-12-17 DIAGNOSIS — R293 Abnormal posture: Secondary | ICD-10-CM

## 2016-12-17 NOTE — Therapy (Addendum)
[facility] [address]  [place], [place], [address] Phone: [phone]   Fax:  [phone]  Physical Therapy Treatment  Patient Details  Name: Laura [name] MRN: [id number] Date of Birth: [birth year] Referring Provider: Dr. Laroy Apple  Encounter Date: 12/17/2016      PT End of Session - 12/17/16 1107    Visit Number 5   Number of Visits 12   Date for PT Re-Evaluation 12/31/16   Authorization Type Medicare   PT Start Time 1018   PT Stop Time 1059   PT Time Calculation (min) 41 min   Activity Tolerance Patient tolerated treatment well   Behavior During Therapy [facility] for tasks assessed/performed      Past Medical History:  Diagnosis Date  . Cancer ([place])    basal on back and nose  . Cataract   . Chicken pox as a child  . EBV infection    mono as child  . Fibrocystic breast 05/01/2014  . GERD (gastroesophageal reflux disease)    pt denies  . Hyperlipidemia   . Hypertension 20 yrs ago  . Measles as a child  . Medicare annual wellness visit, subsequent 02/01/2015  . Mumps as a child  . Neck pain 01/22/2016  . Other and unspecified hyperlipidemia 10/13/2013  . Overweight(278.02)   . Vasovagal reaction 07/15/2015   no syncope but close     Past Surgical History:  Procedure Laterality Date  . ABDOMINAL HYSTERECTOMY  29 yrs ago   heavy bleeding, ovaries left in place  . 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 COLONOSCOPY    . EYE SURGERY  11-27-2010   cataract surgery in both eyes  . POLYPECTOMY    . VAGINAL HYSTERECTOMY  76 yrs old   uterus removed    There were no vitals filed for this visit.      Subjective Assessment - 12/17/16 1020    Subjective Sees Dr. Christella Noa today for follow-up - feels achy today   Diagnostic tests MRI - 2017 - see chart   Patient Stated Goals "I'd like to not have any pain."   Currently in Pain? Yes   Pain Score 5    Pain  Location Neck   Pain Orientation Right;Left;Posterior   Pain Descriptors / Indicators Aching;Tightness;Discomfort   Pain Type Chronic pain                         [facility] Adult PT Treatment/Exercise - 12/17/16 0001      Neck Exercises: Seated   Neck Retraction 10 reps;5 secs     Modalities   Modalities Iontophoresis     Iontophoresis   Type of Iontophoresis Dexamethasone   Location L lateral cervical region   Dose 1.0 mL   Time 80 mA; 4-6 hour patch     Manual Therapy   Manual Therapy Soft tissue mobilization;Myofascial release   Manual therapy comments patient seated; education and patient demo for self STM with ball for home relief   Soft tissue mobilization STM to B UT, B LS, B cervical and thoracic paraspinals, B infrspinatus/teres group   Myofascial Release manual trigger point release to B infraspinatus/teres, R LS, UT                     PT Long Term Goals - 12/04/16 1231      PT LONG TERM GOAL #1  Title Patient to be independent with HEP for cervical ROM/stretching and postural re-ed (12/31/16)   Status On-going     PT LONG TERM GOAL #2   Title Patient to improve AROM of cervical rotation to [facility] and symmetrical, as well as lateral flexion by 10 degrees and symmetrical (12/31/16)   Status On-going     PT LONG TERM GOAL #3   Title Patient to demonstrate good postural awareness with ability to self-correct (12/31/16)   Status On-going     PT LONG TERM GOAL #4   Title Patient to report ability to perform household tasks and ADLs without neck pain limiting function (12/31/16)   Status On-going               Plan - 12/17/16 1108    Clinical Impression Statement PT session continuing to heavily focuson STM and manual trigger point release as patients primary concerns is neck pain. Many trigger points and taut bands noted throughout cervical and upper back musculature with patient continuing to be tender to palpation and deep pressure.  Education on sefl STM today with ball for home relief with good carryover.    PT Treatment/Interventions ADLs/Self Care Home Management;Cryotherapy;Electrical Stimulation;Iontophoresis 21m/ml Dexamethasone;Moist Heat;Traction;Ultrasound;Neuromuscular re-education;Therapeutic exercise;Therapeutic activities;Patient/family education;Manual techniques;Passive range of motion;Vasopneumatic Device;Taping;Dry needling   PT Next Visit Plan gentle stretching, ionto and other modailites as indicated   Consulted and Agree with Plan of Care Patient      Patient will benefit from skilled therapeutic intervention in order to improve the following deficits and impairments:  Pain, Increased fascial restricitons, Impaired UE functional use, Decreased activity tolerance, Decreased range of motion  Visit Diagnosis: Cervicalgia  Abnormal posture  G-Codes    Functional Assessment Tool Used (Outpatient Only) Clinical judgement   Functional Limitation Carrying, moving and handling objects   Carrying, Moving and Handling Objects Goal Status (([id number] At least 20 percent but less than 40 percent impaired, limited or restricted   Carrying, Moving and Handling Objects Discharge Status ([phone] At least 40 percent but less than 60 percent impaired, limited or restricted      Problem List Patient Active Problem List   Diagnosis Date Noted  . Neck pain 01/22/2016  . Vasovagal reaction 07/15/2015  . History of colonic polyps 07/15/2015  . Medicare annual wellness visit, subsequent 02/01/2015  . Muscle strain 08/11/2014  . Hyperlipidemia 07/18/2014  . Fibrocystic breast 05/01/2014  . 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 Decreased visual acuity 01/23/2014  . HTN (hypertension) 01/23/2014  . Sun-damaged skin 01/23/2014  . Hot flashes 01/23/2014  . Recurrent BCC (basal cell carcinoma) 10/13/2013  . Vitamin D deficiency 10/13/2013  . Pain in joint, ankle and foot 10/13/2013  . [place]   . Cataract   . EBV  infection   . Overweight      SLanney Gins PT, DPT 12/17/16 1:28 PM  PHYSICAL THERAPY DISCHARGE SUMMARY  Visits from Start of Care: 5  Current functional level related to goals / functional outcomes: See above - continued pain and stiffness. Patient to follow-up with MD regarding these concerns   Remaining deficits: See above   Education / Equipment: HEP  Plan: Patient agrees to discharge.  Patient goals were not met. Patient is being discharged due to the patient's request.  ?????    Patient requesting to seen by MD for follow up. Will gladly see patient in the future for any other needs.  SLanney Gins PT, DPT 01/20/17 2:02 PM   [facility] [address]  [place], [place], [address] Phone: [phone]   Fax:  [phone]  Name: Janiesha Diehl MRN: [id number] Date of Birth: 21-Aug-1940

## 2016-12-18 ENCOUNTER — Other Ambulatory Visit (HOSPITAL_BASED_OUTPATIENT_CLINIC_OR_DEPARTMENT_OTHER): Payer: Self-pay | Admitting: Neurosurgery

## 2016-12-18 DIAGNOSIS — M47892 Other spondylosis, cervical region: Secondary | ICD-10-CM

## 2016-12-19 ENCOUNTER — Ambulatory Visit: Payer: Medicare Other | Admitting: Physical Therapy

## 2016-12-23 ENCOUNTER — Other Ambulatory Visit: Payer: Self-pay | Admitting: Family Medicine

## 2016-12-23 DIAGNOSIS — R1013 Epigastric pain: Secondary | ICD-10-CM

## 2016-12-31 ENCOUNTER — Telehealth: Payer: Self-pay | Admitting: Family Medicine

## 2016-12-31 NOTE — Telephone Encounter (Signed)
DOS 11/21/16 for medicare wellness pt is being billed. Please reassure the pt it will be rebilled correctly so she will not owe for the "blood being taken out of her arm" and the wellness. $40 dr blyth and $12 venipuncture.call pt [phone] or [phone].

## 2017-01-01 NOTE — Telephone Encounter (Signed)
Called patient back and let her know coding has corrected the claim and will resubmit it to the insurance for payment. The dx was not correct

## 2017-01-03 ENCOUNTER — Other Ambulatory Visit (HOSPITAL_BASED_OUTPATIENT_CLINIC_OR_DEPARTMENT_OTHER): Payer: Self-pay | Admitting: Neurosurgery

## 2017-01-03 DIAGNOSIS — M47812 Spondylosis without myelopathy or radiculopathy, cervical region: Secondary | ICD-10-CM

## 2017-01-04 ENCOUNTER — Ambulatory Visit (HOSPITAL_BASED_OUTPATIENT_CLINIC_OR_DEPARTMENT_OTHER)
Admission: RE | Admit: 2017-01-04 | Discharge: 2017-01-04 | Disposition: A | Discharge status: Home / Self Care | Payer: Medicare Other | Source: Ambulatory Visit | Attending: Neurosurgery | Admitting: Neurosurgery

## 2017-01-04 DIAGNOSIS — M47812 Spondylosis without myelopathy or radiculopathy, cervical region: Secondary | ICD-10-CM | POA: Diagnosis not present

## 2017-01-04 DIAGNOSIS — M47892 Other spondylosis, cervical region: Secondary | ICD-10-CM | POA: Insufficient documentation

## 2017-01-04 DIAGNOSIS — M4802 Spinal stenosis, cervical region: Secondary | ICD-10-CM | POA: Diagnosis not present

## 2017-01-04 DIAGNOSIS — M542 Cervicalgia: Secondary | ICD-10-CM | POA: Diagnosis not present

## 2017-01-07 ENCOUNTER — Encounter: Payer: Self-pay | Admitting: Family Medicine

## 2017-01-14 DIAGNOSIS — M47892 Other spondylosis, cervical region: Secondary | ICD-10-CM | POA: Diagnosis not present

## 2017-01-26 HISTORY — PX: TRANSTHORACIC ECHOCARDIOGRAM: SHX275

## 2017-02-03 DIAGNOSIS — M542 Cervicalgia: Secondary | ICD-10-CM | POA: Diagnosis not present

## 2017-02-03 DIAGNOSIS — M47812 Spondylosis without myelopathy or radiculopathy, cervical region: Secondary | ICD-10-CM | POA: Diagnosis not present

## 2017-02-12 ENCOUNTER — Other Ambulatory Visit: Payer: Self-pay | Admitting: Family Medicine

## 2017-02-20 ENCOUNTER — Emergency Department (HOSPITAL_BASED_OUTPATIENT_CLINIC_OR_DEPARTMENT_OTHER): Payer: Medicare Other

## 2017-02-20 ENCOUNTER — Encounter (HOSPITAL_BASED_OUTPATIENT_CLINIC_OR_DEPARTMENT_OTHER): Payer: Self-pay | Admitting: *Deleted

## 2017-02-20 ENCOUNTER — Observation Stay (HOSPITAL_BASED_OUTPATIENT_CLINIC_OR_DEPARTMENT_OTHER)
Admission: EM | Admit: 2017-02-20 | Discharge: 2017-02-21 | Disposition: A | Discharge status: Home / Self Care | Payer: Medicare Other | Attending: Internal Medicine | Admitting: Internal Medicine

## 2017-02-20 DIAGNOSIS — Z8249 Family history of ischemic heart disease and other diseases of the circulatory system: Secondary | ICD-10-CM | POA: Diagnosis not present

## 2017-02-20 DIAGNOSIS — E1069 Type 1 diabetes mellitus with other specified complication: Secondary | ICD-10-CM

## 2017-02-20 DIAGNOSIS — Z87891 Personal history of nicotine dependence: Secondary | ICD-10-CM | POA: Diagnosis not present

## 2017-02-20 DIAGNOSIS — I5189 Other ill-defined heart diseases: Secondary | ICD-10-CM | POA: Diagnosis not present

## 2017-02-20 DIAGNOSIS — E669 Obesity, unspecified: Secondary | ICD-10-CM

## 2017-02-20 DIAGNOSIS — Z85828 Personal history of other malignant neoplasm of skin: Secondary | ICD-10-CM | POA: Insufficient documentation

## 2017-02-20 DIAGNOSIS — R110 Nausea: Secondary | ICD-10-CM | POA: Diagnosis not present

## 2017-02-20 DIAGNOSIS — R079 Chest pain, unspecified: Secondary | ICD-10-CM | POA: Diagnosis not present

## 2017-02-20 DIAGNOSIS — E785 Hyperlipidemia, unspecified: Secondary | ICD-10-CM | POA: Diagnosis not present

## 2017-02-20 DIAGNOSIS — I129 Hypertensive chronic kidney disease with stage 1 through stage 4 chronic kidney disease, or unspecified chronic kidney disease: Secondary | ICD-10-CM | POA: Diagnosis not present

## 2017-02-20 DIAGNOSIS — I10 Essential (primary) hypertension: Secondary | ICD-10-CM | POA: Diagnosis present

## 2017-02-20 DIAGNOSIS — R0789 Other chest pain: Principal | ICD-10-CM | POA: Insufficient documentation

## 2017-02-20 DIAGNOSIS — F419 Anxiety disorder, unspecified: Secondary | ICD-10-CM | POA: Insufficient documentation

## 2017-02-20 DIAGNOSIS — N183 Chronic kidney disease, stage 3 unspecified: Secondary | ICD-10-CM | POA: Diagnosis present

## 2017-02-20 DIAGNOSIS — Z79899 Other long term (current) drug therapy: Secondary | ICD-10-CM | POA: Insufficient documentation

## 2017-02-20 HISTORY — DX: Other cervical disc degeneration, unspecified cervical region: M50.30

## 2017-02-20 HISTORY — DX: Chronic kidney disease, stage 3 (moderate): N18.3

## 2017-02-20 LAB — BASIC METABOLIC PANEL
Anion gap: 10 (ref 5–15)
BUN: 21 mg/dL — ABNORMAL HIGH (ref 6–20)
CO2: 26 mmol/L (ref 22–32)
Calcium: 9.9 mg/dL (ref 8.9–10.3)
Chloride: 102 mmol/L (ref 101–111)
Creatinine, Ser: 0.98 mg/dL (ref 0.44–1.00)
GFR calc Af Amer: >60 mL/min (ref >60)
GFR calc non Af Amer: 55 mL/min — ABNORMAL LOW (ref >60)
Glucose, Bld: 108 mg/dL — ABNORMAL HIGH (ref 65–99)
Potassium: 4.4 mmol/L (ref 3.5–5.1)
Sodium: 138 mmol/L (ref 135–145)

## 2017-02-20 LAB — CBC
HCT: 40.1 % (ref 36.0–46.0)
Hemoglobin: 13.6 g/dL (ref 12.0–15.0)
MCH: 29.7 pg (ref 26.0–34.0)
MCHC: 33.9 g/dL (ref 30.0–36.0)
MCV: 87.6 fL (ref 78.0–100.0)
Platelets: 267 10*3/uL (ref 150–400)
RBC: 4.58 MIL/uL (ref 3.87–5.11)
RDW: 14 % (ref 11.5–15.5)
WBC: 6.8 10*3/uL (ref 4.0–10.5)

## 2017-02-20 LAB — TROPONIN I
Troponin I: <0.03 ng/mL (ref <0.03)
Troponin I: <0.03 ng/mL (ref <0.03)
Troponin I: <0.03 ng/mL (ref <0.03)

## 2017-02-20 MED ORDER — ENOXAPARIN SODIUM 40 MG/0.4ML ~~LOC~~ SOLN
40.0000 mg | SUBCUTANEOUS | Status: DC
  Filled 2017-02-20: qty 0.4

## 2017-02-20 MED ORDER — LOSARTAN POTASSIUM-HCTZ 50-12.5 MG PO TABS: 1.0000 | ORAL_TABLET | Freq: Every day | ORAL | Status: DC

## 2017-02-20 MED ORDER — GI COCKTAIL ~~LOC~~: 30.0000 mL | Freq: Two times a day (BID) | ORAL | Status: DC | PRN

## 2017-02-20 MED ORDER — TIZANIDINE HCL 4 MG PO TABS: 2.0000 mg | ORAL_TABLET | Freq: Three times a day (TID) | ORAL | Status: DC | PRN

## 2017-02-20 MED ORDER — NAPROXEN 250 MG PO TABS
500.0000 mg | ORAL_TABLET | Freq: Two times a day (BID) | ORAL | Status: DC
  Administered 2017-02-20: 500 mg via ORAL
  Filled 2017-02-20 (×2): qty 2

## 2017-02-20 MED ORDER — FAMOTIDINE 20 MG PO TABS
20.0000 mg | ORAL_TABLET | Freq: Every day | ORAL | Status: DC
  Filled 2017-02-20: qty 1

## 2017-02-20 MED ORDER — ONDANSETRON HCL 4 MG/2ML IJ SOLN: 4.0000 mg | Freq: Four times a day (QID) | INTRAMUSCULAR | Status: DC | PRN

## 2017-02-20 MED ORDER — ACETAMINOPHEN 325 MG PO TABS: 650.0000 mg | ORAL_TABLET | ORAL | Status: DC | PRN

## 2017-02-20 MED ORDER — VITAMIN D 1000 UNITS PO TABS
1000.0000 [IU] | ORAL_TABLET | Freq: Every day | ORAL | Status: DC
  Filled 2017-02-20: qty 1

## 2017-02-20 MED ORDER — LOSARTAN POTASSIUM 50 MG PO TABS
50.0000 mg | ORAL_TABLET | Freq: Every day | ORAL | Status: DC
  Administered 2017-02-21: 50 mg via ORAL
  Filled 2017-02-20: qty 1

## 2017-02-20 MED ORDER — NITROGLYCERIN 0.4 MG SL SUBL: 0.4000 mg | SUBLINGUAL_TABLET | SUBLINGUAL | Status: DC | PRN

## 2017-02-20 MED ORDER — HYDROCHLOROTHIAZIDE 12.5 MG PO CAPS
12.5000 mg | ORAL_CAPSULE | Freq: Every day | ORAL | Status: DC
  Filled 2017-02-20: qty 1

## 2017-02-20 MED ORDER — LORAZEPAM 2 MG/ML IJ SOLN
0.5000 mg | Freq: Once | INTRAMUSCULAR | Status: AC
  Administered 2017-02-20: 0.5 mg via INTRAVENOUS
  Filled 2017-02-20: qty 1

## 2017-02-20 MED ORDER — ONDANSETRON HCL 4 MG/2ML IJ SOLN
4.0000 mg | Freq: Once | INTRAMUSCULAR | Status: AC
  Administered 2017-02-20: 4 mg via INTRAVENOUS
  Filled 2017-02-20: qty 2

## 2017-02-20 MED ORDER — ASPIRIN 81 MG PO CHEW
324.0000 mg | CHEWABLE_TABLET | Freq: Once | ORAL | Status: AC
  Administered 2017-02-20: 324 mg via ORAL
  Filled 2017-02-20: qty 4

## 2017-02-20 MED ORDER — ADULT MULTIVITAMIN W/MINERALS CH
1.0000 | ORAL_TABLET | Freq: Every day | ORAL | Status: DC
  Filled 2017-02-20: qty 1

## 2017-02-20 NOTE — Progress Notes (Signed)
New pt admission from [facility]. Pt brought to the floor in stable condition. Vitals taken. Initial Assessment done. All immediate pertinent needs to patient addressed. Patient Guide given to patient. Important safety instructions relating to hospitalization reviewed with patient. Patient verbalized understanding. Will continue to monitor pt. Paged [facility] to get attending.  Clarise Cruz RN

## 2017-02-20 NOTE — ED Provider Notes (Signed)
[facility] Provider Note   CSN: [id number] Arrival date & time: 02/20/17  1025     History   Chief Complaint Chief Complaint  Patient presents with  . Chest Pain    HPI Genavive Kubicki is a 76 y.o. female.   Chest Pain   This is a new problem. The current episode started more than 2 days ago. The problem occurs constantly. The problem has not changed since onset.The pain is associated with exertion. The pain is present in the substernal region. The pain is moderate. The quality of the pain is described as brief and pressure-like. The pain does not radiate. Duration of episode(s) is 5 seconds. Associated symptoms include nausea.    Past Medical History:  Diagnosis Date  . Cancer ([place])    basal on back and nose  . Cataract   . Chicken pox as a child  . DDD (degenerative disc disease), cervical   . EBV infection    mono as child  . Fibrocystic breast 05/01/2014  . GERD (gastroesophageal reflux disease)    pt denies  . Hyperlipidemia   . Hypertension 20 yrs ago  . Measles as a child  . Medicare annual wellness visit, subsequent 02/01/2015  . Mumps as a child  . Neck pain 01/22/2016  . Other and unspecified hyperlipidemia 10/13/2013  . Overweight(278.02)   . Vasovagal reaction 07/15/2015   no syncope but close     Patient Active Problem List   Diagnosis Date Noted  . Chest pain 02/20/2017  . Hypertension 02/20/2017  . HLD (hyperlipidemia) 02/20/2017  . Neck pain 01/22/2016  . Vasovagal reaction 07/15/2015  . History of colonic polyps 07/15/2015  . Medicare annual wellness visit, subsequent 02/01/2015  . Muscle strain 08/11/2014  . Hyperlipidemia 07/18/2014  . Fibrocystic breast 05/01/2014  . 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 Decreased visual acuity 01/23/2014  . HTN (hypertension) 01/23/2014  . Sun-damaged skin 01/23/2014  . Hot flashes 01/23/2014  . Recurrent BCC (basal cell carcinoma) 10/13/2013  . Vitamin D deficiency 10/13/2013  . Pain in joint,  ankle and foot 10/13/2013  . Cancer ([place])   . Cataract   . EBV infection   . Overweight     Past Surgical History:  Procedure Laterality Date  . ABDOMINAL HYSTERECTOMY  29 yrs ago   heavy bleeding, ovaries left in place  . 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 COLONOSCOPY    . EYE SURGERY  11-27-2010   cataract surgery in both eyes  . POLYPECTOMY    . VAGINAL HYSTERECTOMY  76 yrs old   uterus removed    OB History    No data available       Home Medications    Prior to Admission medications   Medication Sig Start Date End Date Taking? Authorizing Provider  acetaminophen (TYLENOL) 500 MG tablet Take 500 mg by mouth See admin instructions. In the morning and at lunch (supplements the Naproxen)   Yes [provider]  cholecalciferol (VITAMIN D) 1000 units tablet Take 1,000 Units by mouth daily.   Yes [provider]  losartan-hydrochlorothiazide (HYZAAR) 50-12.5 MG tablet TAKE 1 TABLET BY MOUTH DAILY. 02/13/17  Yes [name], MD  Multiple Vitamins-Minerals (MULTI-VITAMIN GUMMIES) CHEW Chew 1 tablet by mouth daily.   Yes [provider]  naproxen (NAPROSYN) 500 MG tablet Take 500 mg by mouth at bedtime.  11/11/16  Yes [provider]    Family History Family History  Problem Relation Age of Onset  . Heart disease Father   . Heart attack Father 36  . Hypertension Father   . Dementia Maternal Grandmother   . Heart disease Maternal Grandfather   . Hyperlipidemia Paternal Grandmother   . Cancer Paternal Grandfather        stomach  . Heart attack Paternal Grandfather   . Stomach cancer Paternal Grandfather   . Dementia Mother   . Pulmonary embolism Son   . Colon cancer Neg Hx   . Colon polyps Neg Hx   . Rectal cancer Neg Hx     Social History Social History  Substance Use Topics  . Smoking status: Former Smoker    Packs/day: 1.00    Years: 16.00    Types: Cigarettes    Start date:  07/29/1972  . Smokeless tobacco: Never Used  . Alcohol use No     Allergies   Lisinopril; Statins; Sulfa antibiotics; and Tetracyclines & related   Review of Systems Review of Systems  Cardiovascular: Positive for chest pain.  Gastrointestinal: Positive for nausea.  All other systems reviewed and are negative.    Physical Exam Updated Vital Signs BP 139/67 (BP Location: Right Arm)   Pulse 73   Temp 98.4 F (36.9 C) (Oral)   Resp 18   Ht 5\' 4"  (1.626 m)   Wt 79.7 kg (175 lb 11.2 oz)   SpO2 100%   BMI 30.16 kg/m   Physical Exam  Constitutional: She appears well-developed and well-nourished.  HENT:  Head: Normocephalic and atraumatic.  Eyes: Conjunctivae and EOM are normal.  Neck: Normal range of motion.  Cardiovascular: Normal rate and regular rhythm.   Pulmonary/Chest: Effort normal and breath sounds normal. No stridor. No respiratory distress. She has no wheezes.  Abdominal: Soft. She exhibits no distension.  Musculoskeletal: She exhibits tenderness (left chest, does not reproduce symptoms).  Neurological: She is alert. No cranial nerve deficit.  Skin: Skin is warm and dry.  Nursing note and vitals reviewed.    ED Treatments / Results  Labs (all labs ordered are listed, but only abnormal results are displayed) Labs Reviewed  BASIC METABOLIC PANEL - Abnormal; Notable for the following:       Result Value   Glucose, Bld 108 (*)    BUN 21 (*)    GFR calc non Af Amer 55 (*)    All other components within normal limits  CBC  TROPONIN I  TROPONIN I  TROPONIN I  TROPONIN I    EKG  EKG Interpretation  Date/Time:  Thursday February 20 2017 10:33:35 EDT Ventricular Rate:  86 PR Interval:    QRS Duration: 104 QT Interval:  357 QTC Calculation: 427 R Axis:   67 Text Interpretation:  Sinus rhythm nonspecific ST changes in V4-6 No old tracing to compare Reconfirmed by Merrily Pew [phone]) on 02/20/2017 12:27:21 PM       Radiology Dg Chest 2 View  Result  Date: 02/20/2017 CLINICAL DATA:  Left chest pain EXAM: CHEST  2 VIEW COMPARISON:  None. FINDINGS: Heart and mediastinal contours are within normal limits. No focal opacities or effusions. No acute bony abnormality. IMPRESSION: No active cardiopulmonary disease. Electronically Signed   By: Rolm Baptise M.D.   On: 02/20/2017 11:22    Procedures Procedures (including critical care time)  Medications Ordered in ED Medications  tiZANidine (ZANAFLEX) tablet 2-4 mg (not administered)  famotidine (PEPCID) tablet 20 mg (not administered)  cholecalciferol (VITAMIN D) tablet 1,000 Units (not administered)  naproxen (NAPROSYN) tablet 500 mg (not administered)  multivitamin with minerals tablet 1 tablet (not administered)  acetaminophen (TYLENOL) tablet 650 mg (not administered)  ondansetron (ZOFRAN) injection 4 mg (not administered)  enoxaparin (LOVENOX) injection 40 mg (not administered)  nitroGLYCERIN (NITROSTAT) SL tablet 0.4 mg (not administered)  gi cocktail (Maalox,Lidocaine,Donnatal) (not administered)  losartan (COZAAR) tablet 50 mg (not administered)    And  hydrochlorothiazide (MICROZIDE) capsule 12.5 mg (not administered)  aspirin chewable tablet 324 mg (324 mg Oral Given 02/20/17 1119)  ondansetron (ZOFRAN) injection 4 mg (4 mg Intravenous Given 02/20/17 1122)  ondansetron (ZOFRAN) injection 4 mg (4 mg Intravenous Given 02/20/17 1229)  LORazepam (ATIVAN) injection 0.5 mg (0.5 mg Intravenous Given 02/20/17 1229)     Initial Impression / Assessment and Plan / ED Course  I have reviewed the triage vital signs and the nursing notes.  Pertinent labs & imaging results that were available during my care of the patient were reviewed by me and considered in my medical decision making (see chart for details).     HEART Score of 4-5. She feels more comfortable with hospital observation rather than outpatient workup at this time. Will discuss with medicine regarding the same.  Doubt PE.    Possibly costochondritis? But the pressure/fluttery feeling she is having is different.  Possibly anxiety? However has not had h/o cp with anxiety in past.   ASA given zofran with some relief, will give more. Now asking for anxiety meds, will give low dose of ativan.  No current pain, no NTG or heparin.  Final Clinical Impressions(s) / ED Diagnoses   Final diagnoses:  Chest pain, unspecified type  Nausea     Jolynn Bajorek, Corene Cornea, MD 02/20/17 1615

## 2017-02-20 NOTE — H&P (Signed)
History and Physical    Laura [name] MVH:[id number] DOB: 1941-02-14 DOA: 02/20/2017   PCP: [name], MD   Attending physician: Marily Memos  Patient coming from/Resides with: Private residence/husband  Chief Complaint: Left anterior chest pain  HPI: Laura [name] is a 76 y.o. female originally from [place] with medical history significant for remote tobacco use, hypertension and dyslipidemia. Patient presents after having chest discomfort intermittently for one week. She describes left anterior chest pain nonradiating duration of about 2 seconds. She describes this discomfort as "like when she/pressure-like". This discomfort would occur whether at rest or with activity i.e. no definitive pattern. No other associated symptoms until this morning's episode where she felt quite nauseous for several minutes. She felt like she needed to burp at the same time. She did not take an aspirin or any other medications to try to improve the symptoms and the symptoms resolved spontaneously. She is somewhat physically active and has not had chest discomfort, shortness of breath or nausea with activity. She presented to [facility] ED with these symptoms. EDP was concerned that symptoms may be reflective of atypical presentation of cardiac ischemia and given patient's age, risk factors of dyslipidemia and hypertension that additional evaluation warranted. Initial troponin was negative and EKG unchanged from baseline EKG in 2012. I calculated her HEART score at 3.  ED Course:  Vital Signs: BP 139/67 (BP Location: Right Arm)   Pulse 73   Temp 98.4 F (36.9 C) (Oral)   Resp 18   Ht 5\' 4"  (1.626 m)   Wt 79.7 kg (175 lb 11.2 oz)   SpO2 100%   BMI 30.16 kg/m  2 view CXR: Neg Lab data: Sodium 138, potassium 4.4, chloride 102, CO2 26, glucose 108, BUN 21, creatinine 0.98, troponin <0.03, white count 6800 differential not obtained, hemoglobin 13.6, platelets 267,000 Medications and treatments: Aspirin 324 mg 1,  Zofran 4 mg IV 2, Ativan 0.5 mg IV 1  Review of Systems:  In addition to the HPI above,  No Fever-chills, myalgias or other constitutional symptoms No Headache, changes with Vision or hearing, new weakness, tingling, numbness in any extremity, dizziness, dysarthria or word finding difficulty, gait disturbance or imbalance, tremors or seizure activity No problems swallowing food or Liquids, indigestion/reflux, choking or coughing while eating, abdominal pain with or after eating No Cough or Shortness of Breath, palpitations, orthopnea or DOE No Abdominal pain, emesis, melena,hematochezia, dark tarry stools, constipation No dysuria, malodorous urine, hematuria or flank pain No new skin rashes, lesions, masses or bruises, No new joint pains, aches, swelling or redness No recent unintentional weight gain or loss No polyuria, polydypsia or polyphagia   Past Medical History:  Diagnosis Date  . Cancer ([place])    basal on back and nose  . Cataract   . Chicken pox as a child  . DDD (degenerative disc disease), cervical   . EBV infection    mono as child  . Fibrocystic breast 05/01/2014  . GERD (gastroesophageal reflux disease)    pt denies  . Hyperlipidemia   . Hypertension 20 yrs ago  . Measles as a child  . Medicare annual wellness visit, subsequent 02/01/2015  . Mumps as a child  . Neck pain 01/22/2016  . Other and unspecified hyperlipidemia 10/13/2013  . Overweight(278.02)   . Vasovagal reaction 07/15/2015   no syncope but close     Past Surgical History:  Procedure Laterality Date  . ABDOMINAL HYSTERECTOMY  30 yrs ago   heavy bleeding, ovaries left in place  .  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 COLONOSCOPY    . EYE SURGERY  11-27-2010   cataract surgery in both eyes  . POLYPECTOMY    . VAGINAL HYSTERECTOMY  77 yrs old   uterus removed    Social History   Social History  . Marital status: Married    Spouse name: N/A  .  Number of children: N/A  . Years of education: N/A   Occupational History  . Not on file.   Social History Main Topics  . Smoking status: Former Smoker    Packs/day: 1.00    Years: 16.00    Types: Cigarettes    Start date: 07/29/1972  . Smokeless tobacco: Never Used  . Alcohol use No  . Drug use: No  . Sexual activity: No     Comment: lives with husband no major dietary restrictions   Other Topics Concern  . Not on file   Social History Narrative  . 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 Lisinopril     dizzy  . Statins     Muscle soreness   . Sulfa Antibiotics Other (See Comments)    "feels feverish"  . Tetracyclines & Related     GI upset    Family History  Problem Relation Age of Onset  . Heart disease Father   . Heart attack Father 30  . Hypertension Father   . Dementia Maternal Grandmother   . Heart disease Maternal Grandfather   . Hyperlipidemia Paternal Grandmother   . Cancer Paternal Grandfather        stomach  . Heart attack Paternal Grandfather   . Stomach cancer Paternal Grandfather   . Dementia Mother   . Pulmonary embolism Son   . Colon cancer Neg Hx   . Colon polyps Neg Hx   . Rectal cancer Neg Hx      Prior to Admission medications   Medication Sig Start Date End Date Taking? Authorizing Provider  cholecalciferol (VITAMIN D) 1000 units tablet Take 1,000 Units by mouth daily.   Yes [provider]  losartan-hydrochlorothiazide (HYZAAR) 50-12.5 MG tablet TAKE 1 TABLET BY MOUTH DAILY. 02/13/17  Yes [name], MD  Multiple Vitamins-Minerals (MULTI-VITAMIN GUMMIES) CHEW Chew 1 tablet by mouth daily.   Yes [provider]  naproxen (NAPROSYN) 500 MG tablet Take 1 tablet by mouth as needed. 11/11/16  Yes [provider]  Fiber, Guar Gum, CHEW Chew by mouth as needed.     [provider]  ranitidine (ZANTAC) 150 MG tablet TAKE 1 TABLET BY MOUTH TWICE A DAY 12/24/16   [name], MD  tiZANidine (ZANAFLEX) 4 MG tablet Take 0.5-1 tablets by mouth 3 (three) times daily as needed for spasms. 11/11/16   [provider]    Physical Exam: Vitals:   02/20/17 1245 02/20/17 1300 02/20/17 1315 02/20/17 1432  BP:  (!) 149/124 (!) 153/93 139/67  Pulse: 75 75 81 73  Resp: 14 17 16 18   Temp:   98.1 F (36.7 C) 98.4 F (36.9 C)  TempSrc:   Oral Oral  SpO2: 100% 100% 97% 100%  Weight:    79.7 kg (175 lb 11.2 oz)  Height:    5\' 4"  (1.626 m)      Constitutional: NAD, calm, comfortable Eyes: PERRL, lids and conjunctivae  normal ENMT: Mucous membranes are moist. Posterior pharynx clear of any exudate or lesions.Normal dentition.  Neck: normal, supple, no masses, no thyromegaly Respiratory: clear to auscultation bilaterally, no wheezing, no crackles. Normal respiratory effort. No accessory muscle use.  Cardiovascular: Regular rate and rhythm, no murmurs / rubs / gallops. No extremity edema. 2+ pedal pulses. No carotid bruits.  Abdomen: no tenderness, no masses palpated. No hepatosplenomegaly. Bowel sounds positive.  Musculoskeletal: no clubbing / cyanosis. No joint deformity upper and lower extremities. Good ROM, no contractures. Normal muscle tone.  Skin: no rashes, lesions, ulcers. No induration Neurologic: CN 2-12 grossly intact. Sensation intact, DTR normal. Strength 5/5 x all 4 extremities.  Psychiatric: Normal judgment and insight. Alert and oriented x 3. Normal mood.    Labs on Admission: I have personally reviewed following labs and imaging studies  CBC:  Recent Labs Lab 02/20/17 1116  WBC 6.8  HGB 13.6  HCT 40.1  MCV 87.6  PLT 657   Basic Metabolic Panel:  Recent Labs Lab 02/20/17 1116  NA 138  K 4.4  CL 102  CO2 26  GLUCOSE 108*  BUN 21*  CREATININE 0.98  CALCIUM 9.9   GFR: Estimated Creatinine Clearance: 51.4  mL/min (by C-G formula based on SCr of 0.98 mg/dL). Liver Function Tests: No results for input(s): AST, ALT, ALKPHOS, BILITOT, PROT, ALBUMIN in the last 168 hours. No results for input(s): LIPASE, AMYLASE in the last 168 hours. No results for input(s): AMMONIA in the last 168 hours. Coagulation Profile: No results for input(s): INR, PROTIME in the last 168 hours. Cardiac Enzymes:  Recent Labs Lab 02/20/17 1116  TROPONINI <0.03   BNP (last 3 results) No results for input(s): PROBNP in the last 8760 hours. HbA1C: No results for input(s): HGBA1C in the last 72 hours. CBG: No results for input(s): GLUCAP in the last 168 hours. Lipid Profile: No results for input(s): CHOL, HDL, LDLCALC, TRIG, CHOLHDL, LDLDIRECT in the last 72 hours. Thyroid Function Tests: No results for input(s): TSH, T4TOTAL, FREET4, T3FREE, THYROIDAB in the last 72 hours. Anemia Panel: No results for input(s): VITAMINB12, FOLATE, FERRITIN, TIBC, IRON, RETICCTPCT in the last 72 hours. Urine analysis:    Component Value Date/Time   BILIRUBINUR neg 05/07/2016 1017   PROTEINUR 0.15 05/07/2016 1017   UROBILINOGEN negative 05/07/2016 1017   NITRITE neg 05/07/2016 1017   LEUKOCYTESUR moderate (2+) (A) 05/07/2016 1017   Sepsis Labs: @LABRCNTIP (procalcitonin:4,lacticidven:4) )No results found for this or any previous visit (from the past 240 hour(s)).   Radiological Exams on Admission: Dg Chest 2 View  Result Date: 02/20/2017 CLINICAL DATA:  Left chest pain EXAM: CHEST  2 VIEW COMPARISON:  None. FINDINGS: Heart and mediastinal contours are within normal limits. No focal opacities or effusions. No acute bony abnormality. IMPRESSION: No active cardiopulmonary disease. Electronically Signed   By: Rolm Baptise M.D.   On: 02/20/2017 11:22    EKG: (Independently reviewed) Sinus rhythm with ventricular rate 86 bpm, QTC 427 ms, normal rotation, no definitive ischemic changes and EKG unchanged from  2012  Assessment/Plan Principal Problem:   Chest pain -Patient presents with atypical left anterior chest discomfort of very short duration (~ 2 seconds) associated with nausea with normal troponin and normal EKG -HEART score: 3 -Cycle troponin -Echocardiogram -NPO after MN in the event rounding physician feels stress test indicated -SL NTG prn chest pain -GI cocktail prn nausea/chest pain  Active Problems:   Hypertension -Blood pressure currently controlled  -Continue losartan-HCTZ    HLD (  hyperlipidemia) -Lipid panel in April with cholesterol 258, HDL 48, LDL 175 and triglycerides 209 -Repeat fasting lipid panel in a.m. -Not on statin or omega-3 fatty acids prior to admission     DVT prophylaxis: Lovenox Code Status: Full Family Communication: No family at bedside Disposition Plan: Home Consults called: None    Durant Scibilia L. ANP-BC [facility] Pager [phone]   If 7PM-7AM, please contact night-coverage [email or web address] Password [facility]  02/20/2017, 3:25 PM

## 2017-02-20 NOTE — ED Triage Notes (Signed)
Patient states she has a one week history of a "funny feeling" in the left chest which last for a few seconds, and occurs several times a day.  States today while preparing food, she developed the same discomfort which lasted for about 2 seconds, but today she developed nausea afterwards.  Has a history of DDD of the cervical spine and is currently being treated with naproxen.  Describes the feeling in her left chest as dull discomfort which lasts for a few seconds.

## 2017-02-20 NOTE — Progress Notes (Addendum)
Pt requests to see a Cardiologist while hospitalized. Her husband is a patient of Dr. Stanford Breed.   Pt also very anxious about crackles being auscultated in pt's left lower lobe. Will continue to monitor.

## 2017-02-21 ENCOUNTER — Encounter (HOSPITAL_COMMUNITY): Payer: Self-pay | Admitting: Internal Medicine

## 2017-02-21 ENCOUNTER — Other Ambulatory Visit: Payer: Self-pay | Admitting: Cardiology

## 2017-02-21 ENCOUNTER — Observation Stay (HOSPITAL_BASED_OUTPATIENT_CLINIC_OR_DEPARTMENT_OTHER): Payer: Medicare Other

## 2017-02-21 DIAGNOSIS — R079 Chest pain, unspecified: Secondary | ICD-10-CM | POA: Diagnosis not present

## 2017-02-21 DIAGNOSIS — R0789 Other chest pain: Secondary | ICD-10-CM | POA: Diagnosis not present

## 2017-02-21 DIAGNOSIS — E669 Obesity, unspecified: Secondary | ICD-10-CM | POA: Diagnosis not present

## 2017-02-21 DIAGNOSIS — N183 Chronic kidney disease, stage 3 unspecified: Secondary | ICD-10-CM | POA: Diagnosis present

## 2017-02-21 DIAGNOSIS — E785 Hyperlipidemia, unspecified: Secondary | ICD-10-CM

## 2017-02-21 DIAGNOSIS — I10 Essential (primary) hypertension: Secondary | ICD-10-CM

## 2017-02-21 HISTORY — DX: Chronic kidney disease, stage 3 unspecified: N18.30

## 2017-02-21 LAB — LIPID PANEL
Cholesterol: 221 mg/dL — ABNORMAL HIGH (ref 0–200)
HDL: 44 mg/dL (ref >40)
LDL Cholesterol: 147 mg/dL — ABNORMAL HIGH (ref 0–99)
Total CHOL/HDL Ratio: 5 RATIO
Triglycerides: 149 mg/dL (ref <150)
VLDL: 30 mg/dL (ref 0–40)

## 2017-02-21 LAB — TROPONIN I: Troponin I: <0.03 ng/mL (ref <0.03)

## 2017-02-21 LAB — ECHOCARDIOGRAM COMPLETE
Height: 64 in
Weight: 2811.2 oz

## 2017-02-21 MED ORDER — ONDANSETRON 4 MG PO TBDP: 4.0000 mg | ORAL_TABLET | Freq: Three times a day (TID) | ORAL | 0 refills | Status: DC | PRN

## 2017-02-21 MED ORDER — NAPROXEN 250 MG PO TABS
500.0000 mg | ORAL_TABLET | Freq: Two times a day (BID) | ORAL | Status: DC
  Filled 2017-02-21 (×2): qty 2

## 2017-02-21 NOTE — Discharge Summary (Signed)
Physician Discharge Summary  Laura [name] CVE:[id number] DOB: [birth year] DOA: 02/20/2017  PCP: [name], MD  Admit date: 02/20/2017 Discharge date: 02/21/2017  Admitted From: Home Discharge disposition: Home   Recommendations for Outpatient Follow-Up:   1. Outpatient stress test to be arranged by cardiology.   Discharge Diagnosis:   Principal Problem:   Chest pain Active Problems:   Hypertension   HLD (hyperlipidemia)   Nausea   Obesity   Anxiety  Discharge Condition: Improved.  Diet recommendation: Low sodium, heart healthy.  Carbohydrate-modified.   History of Present Illness:    Laura [name] is an 76 y.o. female with a PMH of hypertension and dyslipidemia who was admitted 02/20/17 for evaluation of chest discomfort. Initial troponin negative, and EKG unchanged from baseline with a heart score of 3.   Hospital Course by Problem:   Principal Problem:   Chest pain Troponin negative 4 sets. Normal EKG. 2-D echo showed EF 65% with no regional wall motion abnormalities and grade 1 diastolic dysfunction. Encouraged to take an aspirin daily, but the patient tells me that she had capillary leakage when she took a daily aspirin and she would prefer not to do this.  Active Problems:   Hypertension Controlled on losartan/HCTZ.    HLD (hyperlipidemia) Cholesterol 221, LDL 147. Allergic to statins.    Nausea Zofran ordered as needed.    Obesity Body mass index is 30.16 kg/m.    Stage III chronic kidney disease GFR 55, consistent with stage III chronic kidney disease.   Medical Consultants:    Cardiology   Discharge Exam:   Vitals:   02/21/17 0055 02/21/17 1200  BP: 135/69 (!) 129/51  Pulse: 70 68  Resp: 18 18  Temp: 98 F (36.7 C) 97.9 F (36.6 C)   Vitals:   02/20/17 1432 02/20/17 2013 02/21/17 0055 02/21/17 1200  BP: 139/67 131/67 135/69 (!) 129/51  Pulse: 73 65 70 68  Resp: 18 16 18 18   Temp: 98.4 F (36.9 C) 98.1 F (36.7  C) 98 F (36.7 C) 97.9 F (36.6 C)  TempSrc: Oral Oral Oral Oral  SpO2: 100% 97% 98% 100%  Weight: 79.7 kg (175 lb 11.2 oz)     Height: 5\' 4"  (1.626 m)       General exam: Appears restless and anxious, pacing in the hallways waiting for me to discharge her. Respiratory system: Clear to auscultation. Respiratory effort normal. Cardiovascular system: S1 & S2 heard, RRR. No JVD,  rubs, gallops or clicks. No murmurs. Gastrointestinal system: Abdomen is nondistended, soft and nontender. No organomegaly or masses felt. Normal bowel sounds heard. Central nervous system: Alert and oriented. No focal neurological deficits. Extremities: No clubbing,  or cyanosis. No edema. Skin: No rashes, lesions or ulcers. Psychiatry: Judgement and insight appear impaired. Mood & affect anxious.    The results of significant diagnostics from this hospitalization (including imaging, microbiology, ancillary and laboratory) are listed below for reference.     Procedures and Diagnostic Studies:   Dg Chest 2 View  Result Date: 02/20/2017 CLINICAL DATA:  Left chest pain EXAM: CHEST  2 VIEW COMPARISON:  None. FINDINGS: Heart and mediastinal contours are within normal limits. No focal opacities or effusions. No acute bony abnormality. IMPRESSION: No active cardiopulmonary disease. Electronically Signed   By: Rolm Baptise M.D.   On: 02/20/2017 11:22     Labs:   Basic Metabolic Panel:  Recent Labs Lab 02/20/17 1116  NA 138  K 4.4  CL 102  CO2 26  GLUCOSE 108*  BUN 21*  CREATININE 0.98  CALCIUM 9.9   GFR Estimated Creatinine Clearance: 51.4 mL/min (by C-G formula based on SCr of 0.98 mg/dL).  CBC:  Recent Labs Lab 02/20/17 1116  WBC 6.8  HGB 13.6  HCT 40.1  MCV 87.6  PLT 267   Cardiac Enzymes:  Recent Labs Lab 02/20/17 1116 02/20/17 1517 02/20/17 2116 02/21/17 0320  TROPONINI <0.03 <0.03 <0.03 <0.03   Lipid Profile  Recent Labs  02/21/17 0320  CHOL 221*  HDL 44  LDLCALC 147*   TRIG 149  CHOLHDL 5.0     Discharge Instructions:   Discharge Instructions    Call MD for:  extreme fatigue    Complete by:  As directed    Call MD for:  persistant dizziness or light-headedness    Complete by:  As directed    Call MD for:  persistant nausea and vomiting    Complete by:  As directed    Call MD for:  severe uncontrolled pain    Complete by:  As directed    Diet - low sodium heart healthy    Complete by:  As directed    Increase activity slowly    Complete by:  As directed      Allergies as of 02/21/2017      Reactions   Lisinopril Other (See Comments)   dizzy   Statins Other (See Comments)   Muscle soreness   Sulfa Antibiotics Other (See Comments)   "feels feverish"   Tetracyclines & Related Other (See Comments)   GI upset      Medication List    TAKE these medications   acetaminophen 500 MG tablet Commonly known as:  TYLENOL Take 500 mg by mouth See admin instructions. In the morning and at lunch (supplements the Naproxen)   cholecalciferol 1000 units tablet Commonly known as:  VITAMIN D Take 1,000 Units by mouth daily.   losartan-hydrochlorothiazide 50-12.5 MG tablet Commonly known as:  HYZAAR TAKE 1 TABLET BY MOUTH DAILY.   MULTI-VITAMIN GUMMIES Chew Chew 1 tablet by mouth daily.   naproxen 500 MG tablet Commonly known as:  NAPROSYN Take 500 mg by mouth at bedtime.   ondansetron 4 MG disintegrating tablet Commonly known as:  ZOFRAN ODT Take 1 tablet (4 mg total) by mouth every 8 (eight) hours as needed for nausea or vomiting.      Follow-up Information    [facility] Follow up on 02/28/2017.   Why:  at 11am for your stress test.  Contact information: [place] [id number] [phone]       [name], MD.   Specialty:  Family Medicine Why:  Schedule follow up appointment 7-10 days out Contact information: [place] [address] [phone]            Time coordinating discharge: 25 minutes.  Signed:  Rasheem Figiel  Pager [phone] [facility] 02/21/2017, 5:06 PM

## 2017-02-21 NOTE — Progress Notes (Signed)
Pt has orders to be discharged. Discharge instructions given and pt has no additional questions at this time. Medication regimen reviewed and pt educated. Pt verbalized understanding and has no additional questions. Telemetry box removed. IV removed and site in good condition. Pt stable and waiting for transportation. 

## 2017-02-21 NOTE — Progress Notes (Signed)
  Echocardiogram 2D Echocardiogram has been performed.  Laura [name] 02/21/2017, 10:26 AM

## 2017-02-21 NOTE — Consult Note (Signed)
Cardiology Consult    Patient ID: Laura [name] MRN: [id number], DOB/AGE: 1941-02-06   Admit date: 02/20/2017 Date of Consult: 02/21/2017  Primary Physician: [name], MD Primary Cardiologist: New (prefers to follow up with Dr. Stanford Breed [facility]) Requesting Provider: Rama Reason for Consultation: Chest pain  Laura [name] is a 76 y.o. female who is being seen today for the evaluation of chest pain at the request of Dr. Rockne Menghini.  Patient Profile    76 yo female with PMH of tobacco use, HTN, and HL who presented with chest pain.   Past Medical History   Past Medical History:  Diagnosis Date  . Cancer ([place])    basal on back and nose  . Cataract   . Chicken pox as a child  . DDD (degenerative disc disease), cervical   . EBV infection    mono as child  . Fibrocystic breast 05/01/2014  . GERD (gastroesophageal reflux disease)    pt denies  . Hyperlipidemia   . Hypertension 20 yrs ago  . Measles as a child  . Medicare annual wellness visit, subsequent 02/01/2015  . Mumps as a child  . Neck pain 01/22/2016  . Other and unspecified hyperlipidemia 10/13/2013  . Overweight(278.02)   . Vasovagal reaction 07/15/2015   no syncope but close     Past Surgical History:  Procedure Laterality Date  . ABDOMINAL HYSTERECTOMY  29 yrs ago   heavy bleeding, ovaries left in place  . 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 COLONOSCOPY    . EYE SURGERY  11-27-2010   cataract surgery in both eyes  . POLYPECTOMY    . VAGINAL HYSTERECTOMY  76 yrs old   uterus removed    Allergies  Allergies  Allergen Reactions  . Lisinopril Other (See Comments)    dizzy  . Statins Other (See Comments)    Muscle soreness   . Sulfa Antibiotics Other (See Comments)    "feels feverish"  . Tetracyclines & Related Other (See Comments)    GI upset    History of Present Illness    Laura [name] is a 76 yo female with PMH of  tobacco use, HTN, and HL. She is  originally from [place], but has lived in this area for many years. Reports that her husband recently underwent bypass surgery and is followed by Dr. Stanford Breed. She has family history of CAD with father and grandfather both passing from heart disease. History of hyperlipidemia, but has been intolerant to statins and not been on medication for over 20 years. States over the past week she has had intermittent brief chest discomfort that last only a couple of seconds whenever present. Not associated with exertion. No other associated symptoms until yesterday morning whenever she had an episode with nausea. States symptoms resolved spontaneously. Presented to [facility] ED with her symptoms, and was transferred to call for further workup given her risk factors. Troponin has been negative 4. No further episodes of chest pain. She is ambulated the halls without any further symptoms. EKG showed sinus rhythm with no acute ST/T-wave abnormalities. Echo has been ordered but official read is pending. Lipid panel showed LDL 147, and total cholesterol 221. Cardiology was asked to consult prior to discharge per patient request.  Inpatient Medications    . cholecalciferol  1,000 Units Oral Daily  . enoxaparin (LOVENOX) injection  40 mg Subcutaneous Q24H  . famotidine  20 mg Oral Daily  . losartan  50 mg Oral Daily   And  . hydrochlorothiazide  12.5 mg Oral Daily  . multivitamin with minerals  1 tablet Oral Daily  . naproxen  500 mg Oral BID WC    Family History    Family History  Problem Relation Age of Onset  . Heart disease Father   . Heart attack Father 53  . Hypertension Father   . Dementia Maternal Grandmother   . Heart disease Maternal Grandfather   . Hyperlipidemia Paternal Grandmother   . Cancer Paternal Grandfather        stomach  . Heart attack Paternal Grandfather   . Stomach cancer Paternal Grandfather   . Dementia Mother   . Pulmonary embolism Son   . Colon cancer Neg Hx   .  Colon polyps Neg Hx   . Rectal cancer Neg Hx     Social History    Social History   Social History  . Marital status: Married    Spouse name: N/A  . Number of children: N/A  . Years of education: N/A   Occupational History  . Not on file.   Social History Main Topics  . Smoking status: Former Smoker    Packs/day: 1.00    Years: 16.00    Types: Cigarettes    Start date: 07/29/1972  . Smokeless tobacco: Never Used  . Alcohol use No  . Drug use: No  . Sexual activity: No     Comment: lives with husband no major dietary restrictions   Other Topics Concern  . Not on file   Social History Narrative  . No narrative on file     Review of Systems    See history of present illness All other systems reviewed and are otherwise negative except as noted above.  Physical Exam    Blood pressure 135/69, pulse 70, temperature 98 F (36.7 C), temperature source Oral, resp. rate 18, height 5\' 4"  (1.626 m), weight 175 lb 11.2 oz (79.7 kg), SpO2 98 %.  General: Pleasant Older female, NAD Psych: Normal affect. Neuro: Alert and oriented X 3. Moves all extremities spontaneously. HEENT: Normal  Neck: Supple without bruits or JVD. Lungs:  Resp regular and unlabored, CTA. Heart: RRR no s3, s4, or murmurs. Abdomen: Soft, non-tender, non-distended, BS + x 4.  Extremities: No clubbing, cyanosis or edema. DP/PT/Radials 2+ and equal bilaterally.  Labs    Troponin (Point of Care Test) No results for input(s): TROPIPOC in the last 72 hours.  Recent Labs  02/20/17 1116 02/20/17 1517 02/20/17 2116 02/21/17 0320  TROPONINI <0.03 <0.03 <0.03 <0.03   Lab Results  Component Value Date   WBC 6.8 02/20/2017   HGB 13.6 02/20/2017   HCT 40.1 02/20/2017   MCV 87.6 02/20/2017   PLT 267 02/20/2017    Recent Labs Lab 02/20/17 1116  NA 138  K 4.4  CL 102  CO2 26  BUN 21*  CREATININE 0.98  CALCIUM 9.9  GLUCOSE 108*   Lab Results  Component Value Date   CHOL 221 (H) 02/21/2017    HDL 44 02/21/2017   LDLCALC 147 (H) 02/21/2017   TRIG 149 02/21/2017   No results found for: [facility]   Radiology Studies    Dg Chest 2 View  Result Date: 02/20/2017 CLINICAL DATA:  Left chest pain EXAM: CHEST  2 VIEW COMPARISON:  None. FINDINGS: Heart and mediastinal contours are within normal limits. No focal opacities or effusions. No acute bony abnormality. IMPRESSION: No active cardiopulmonary disease. Electronically Signed  By: Rolm Baptise M.D.   On: 02/20/2017 11:22    ECG & Cardiac Imaging    EKG: Sinus rhythm with no acute ST/T-wave abnormalities  Echo: Pending  Assessment & Plan    76 yo female with PMH of tobacco use, HTN, and HL who presented with chest pain.  1. Atypical Chest pain: Reports intermittent episodes over the past week lasting only a couple seconds at a time, but developed nausea yesterday with symptoms prompting her to seek medical treatment. No associated symptoms with previous episodes. She has ruled out with enzymes. EKG nonacute. Echo is pending. She does have concerning risk factors and family history of CAD. Given she has ruled out and had no further symptoms we'll plan for outpatient exercise stress test.  2. Hypertension: Controlled with current therapy  3. Hyperlipidemia: Reports being statin intolerance, tried multiple medications in the past without success. LDL 147, total cholesterol 221  4. Remote history of tobacco use  Signed, Reino Bellis, NP-C Pager [phone] 02/21/2017, 10:43 AM

## 2017-02-24 ENCOUNTER — Ambulatory Visit: Payer: Medicare Other | Admitting: Family Medicine

## 2017-02-24 ENCOUNTER — Telehealth: Payer: Self-pay | Admitting: Behavioral Health

## 2017-02-24 NOTE — Telephone Encounter (Signed)
Transition Care Management Follow-up Telephone Call  PCP: [name], MD  Admit date: 02/20/2017 Discharge date: 02/21/2017  Admitted From: Home Discharge disposition: Home   How have you been since you were released from the hospital? Patient stated, "I've been ok, a little anxious because of the circumstances, especially since I have a stress test to be done soon".   Do you understand why you were in the hospital? yes, patient voiced that she felt a sensation over her left breast, not a pain or ache, but in came with a lot of nausea.   Do you understand the discharge instructions? yes   Where were you discharged to? Home   Items Reviewed:  Medications reviewed: yes  Allergies reviewed: yes  Dietary changes reviewed: yes, low sodium, heart healthy, carb-modified diet  Referrals reviewed: yes, f/u with PCP & complete stress on Friday, 02/28/17; if stress test is positive, will need to f/u with Cardiologist, Dr. Stanford Breed.   Functional Questionnaire:   Activities of Daily Living (ADLs):   She states they are independent in the following: ambulation, bathing and hygiene, feeding, continence, grooming, toileting and dressing States they require assistance with the following: None   Any transportation issues/concerns?: no   Any patient concerns? no   Confirmed importance and date/time of follow-up visits scheduled yes, 02/27/17 at 11:15 AM.  Provider Appointment booked with Dr. Charlett Blake.  Confirmed with patient if condition begins to worsen call PCP or go to the ER.  Patient was given the office number and encouraged to call back with question or concerns.  : yes

## 2017-02-25 ENCOUNTER — Telehealth (HOSPITAL_COMMUNITY): Payer: Self-pay | Admitting: *Deleted

## 2017-02-25 NOTE — Telephone Encounter (Signed)
Left message on voicemail in reference to upcoming appointment scheduled for 02/28/17. Phone number given for a call back so details instructions can be given.  Laura [name]

## 2017-02-25 NOTE — Telephone Encounter (Signed)
Patient given detailed instructions per Myocardial Perfusion Study Information Sheet for the test on 02/28/17 at 11:00. Patient notified to arrive 15 minutes early and that it is imperative to arrive on time for appointment to keep from having the test rescheduled.  If you need to cancel or reschedule your appointment, please call the office within 24 hours of your appointment. . Patient verbalized understanding.Laura [name]

## 2017-02-26 DIAGNOSIS — L820 Inflamed seborrheic keratosis: Secondary | ICD-10-CM | POA: Diagnosis not present

## 2017-02-26 DIAGNOSIS — Z08 Encounter for follow-up examination after completed treatment for malignant neoplasm: Secondary | ICD-10-CM | POA: Diagnosis not present

## 2017-02-26 DIAGNOSIS — Z85828 Personal history of other malignant neoplasm of skin: Secondary | ICD-10-CM | POA: Diagnosis not present

## 2017-02-27 ENCOUNTER — Ambulatory Visit (INDEPENDENT_AMBULATORY_CARE_PROVIDER_SITE_OTHER): Payer: Medicare Other | Admitting: Family Medicine

## 2017-02-27 DIAGNOSIS — R079 Chest pain, unspecified: Secondary | ICD-10-CM | POA: Diagnosis not present

## 2017-02-27 DIAGNOSIS — E559 Vitamin D deficiency, unspecified: Secondary | ICD-10-CM | POA: Diagnosis not present

## 2017-02-27 DIAGNOSIS — I10 Essential (primary) hypertension: Secondary | ICD-10-CM | POA: Diagnosis not present

## 2017-02-27 DIAGNOSIS — E785 Hyperlipidemia, unspecified: Secondary | ICD-10-CM

## 2017-02-27 NOTE — Patient Instructions (Signed)

## 2017-02-28 ENCOUNTER — Ambulatory Visit (HOSPITAL_COMMUNITY): Payer: Medicare Other | Attending: Cardiology

## 2017-02-28 DIAGNOSIS — Z8249 Family history of ischemic heart disease and other diseases of the circulatory system: Secondary | ICD-10-CM | POA: Diagnosis not present

## 2017-02-28 DIAGNOSIS — I2510 Atherosclerotic heart disease of native coronary artery without angina pectoris: Secondary | ICD-10-CM | POA: Insufficient documentation

## 2017-02-28 DIAGNOSIS — R079 Chest pain, unspecified: Secondary | ICD-10-CM

## 2017-02-28 DIAGNOSIS — I10 Essential (primary) hypertension: Secondary | ICD-10-CM | POA: Diagnosis not present

## 2017-02-28 LAB — MYOCARDIAL PERFUSION IMAGING
Estimated workload: 4.6 METS
Exercise duration (min): 4 min
Exercise duration (sec): 30 s
LV dias vol: 59 mL (ref 46–106)
LV sys vol: 15 mL
MPHR: 146 {beats}/min
Peak HR: 141 {beats}/min
Percent HR: 96 %
RATE: 0.23
Rest HR: 73 {beats}/min
SDS: 3
SRS: 8
SSS: 11
TID: 0.97

## 2017-02-28 MED ORDER — TECHNETIUM TC 99M TETROFOSMIN IV KIT
33.0000 | PACK | Freq: Once | INTRAVENOUS | Status: AC | PRN
  Administered 2017-02-28: 33 via INTRAVENOUS
  Filled 2017-02-28: qty 33

## 2017-02-28 MED ORDER — TECHNETIUM TC 99M TETROFOSMIN IV KIT
10.6000 | PACK | Freq: Once | INTRAVENOUS | Status: AC | PRN
  Administered 2017-02-28: 10.6 via INTRAVENOUS
  Filled 2017-02-28: qty 11

## 2017-03-02 NOTE — Progress Notes (Signed)
Patient ID: Laura [name], female   DOB: 13-Jan-1941, 76 y.o.   MRN: [id number]   Subjective:    Patient ID: Laura [name], female    DOB: 05-20-1941, 76 y.o.   MRN: [id number]  No chief complaint on file.   HPI Patient is in today for Hospital follow-up. She presented to the hospital with chest pain. Workup in the hospital was negative for any cardiac cause and she is not currently having chest pain. She does have follow-up including testing an appointment with cardiology scheduled. She denies any recent febrile illness or other hospitalizations. She denies any anxiety triggers. Denies palp/SOB/HA/congestion/fevers/GI or GU c/o. Taking meds as prescribed  Past Medical History:  Diagnosis Date  . 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 Cataract   . Chicken pox as a child  . CKD (chronic kidney disease), stage III 02/21/2017  . DDD (degenerative disc disease), cervical   . EBV infection    mono as child  . Fibrocystic breast 05/01/2014  . GERD (gastroesophageal reflux disease)    pt denies  . Hyperlipidemia   . Hypertension 20 yrs ago  . Measles as a child  . Medicare annual wellness visit, subsequent 02/01/2015  . Mumps as a child  . Neck pain 01/22/2016  . Other and unspecified hyperlipidemia 10/13/2013  . Overweight(278.02)   . Vasovagal reaction 07/15/2015   no syncope but close     Past Surgical History:  Procedure Laterality Date  . ABDOMINAL HYSTERECTOMY  29 yrs ago   heavy bleeding, ovaries left in place  . 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 COLONOSCOPY    . EYE SURGERY  11-27-2010   cataract surgery in both eyes  . POLYPECTOMY    . VAGINAL HYSTERECTOMY  76 yrs old   uterus removed    Family History  Problem Relation Age of Onset  . Heart disease Father   . Heart attack Father 87  . Hypertension Father   . Dementia Maternal Grandmother   . Heart disease Maternal Grandfather   . Hyperlipidemia Paternal Grandmother    . Cancer Paternal Grandfather        stomach  . Heart attack Paternal Grandfather   . Stomach cancer Paternal Grandfather   . Dementia Mother   . Pulmonary embolism Son   . Colon cancer Neg Hx   . Colon polyps Neg Hx   . Rectal cancer Neg Hx     Social History   Social History  . Marital status: Married    Spouse name: N/A  . Number of children: N/A  . Years of education: N/A   Occupational History  . Not on file.   Social History Main Topics  . Smoking status: Former Smoker    Packs/day: 1.00    Years: 16.00    Types: Cigarettes    Start date: 07/29/1972  . Smokeless tobacco: Never Used  . Alcohol use No  . Drug use: No  . Sexual activity: No     Comment: lives with husband no major dietary restrictions   Other Topics Concern  . Not on file   Social History Narrative  . No narrative on file    Outpatient Medications Prior to Visit  Medication Sig Dispense Refill  . acetaminophen (TYLENOL) 500 MG tablet Take 500 mg by mouth See admin instructions. In the morning and at lunch (supplements the Naproxen)    . cholecalciferol (VITAMIN D) 1000 units tablet  Take 1,000 Units by mouth daily.    Marland Kitchen losartan-hydrochlorothiazide (HYZAAR) 50-12.5 MG tablet TAKE 1 TABLET BY MOUTH DAILY. 90 tablet 0  . Multiple Vitamins-Minerals (MULTI-VITAMIN GUMMIES) CHEW Chew 1 tablet by mouth daily.    . naproxen (NAPROSYN) 500 MG tablet Take 500 mg by mouth at bedtime.   1  . ondansetron (ZOFRAN ODT) 4 MG disintegrating tablet Take 1 tablet (4 mg total) by mouth every 8 (eight) hours as needed for nausea or vomiting. 20 tablet 0   No facility-administered medications prior to visit.     Allergies  Allergen Reactions  . Lisinopril Other (See Comments)    dizzy  . Statins Other (See Comments)    Muscle soreness   . Sulfa Antibiotics Other (See Comments)    "feels feverish"  . Tetracyclines & Related Other (See Comments)    GI upset    Review of Systems  Constitutional: Negative  for fever and malaise/fatigue.  HENT: Negative for congestion.   Eyes: Negative for blurred vision.  Respiratory: Negative for shortness of breath.   Cardiovascular: Positive for chest pain. Negative for palpitations and leg swelling.  Gastrointestinal: Negative for abdominal pain, blood in stool and nausea.  Genitourinary: Negative for dysuria and frequency.  Musculoskeletal: Negative for falls.  Skin: Negative for rash.  Neurological: Negative for dizziness, loss of consciousness and headaches.  Endo/Heme/Allergies: Negative for environmental allergies.  Psychiatric/Behavioral: Negative for depression. The patient is not nervous/anxious.        Objective:    Physical Exam  Constitutional: She is oriented to person, place, and time. She appears well-developed and well-nourished. No distress.  HENT:  Head: Normocephalic and atraumatic.  Nose: Nose normal.  Eyes: Right eye exhibits no discharge. Left eye exhibits no discharge.  Neck: Normal range of motion. Neck supple.  Cardiovascular: Normal rate and regular rhythm.   No murmur heard. Pulmonary/Chest: Effort normal and breath sounds normal.  Abdominal: Soft. Bowel sounds are normal. There is no tenderness.  Musculoskeletal: She exhibits no edema.  Neurological: She is alert and oriented to person, place, and time.  Skin: Skin is warm and dry.  Psychiatric: She has a normal mood and affect.  Nursing note and vitals reviewed.   BP 122/68 (BP Location: Left Arm, Patient Position: Sitting, Cuff Size: Normal)   Pulse 87   Temp 97.7 F (36.5 C) (Oral)   Resp 18   SpO2 (!) 84%  Wt Readings from Last 3 Encounters:  02/20/17 175 lb 11.2 oz (79.7 kg)  07/15/16 165 lb (74.8 kg)  05/21/16 169 lb (76.7 kg)     Lab Results  Component Value Date   WBC 6.8 02/20/2017   HGB 13.6 02/20/2017   HCT 40.1 02/20/2017   PLT 267 02/20/2017   GLUCOSE 108 (H) 02/20/2017   CHOL 221 (H) 02/21/2017   TRIG 149 02/21/2017   HDL 44  02/21/2017   LDLCALC 147 (H) 02/21/2017   ALT 10 11/26/2016   AST 14 11/26/2016   NA 138 02/20/2017   K 4.4 02/20/2017   CL 102 02/20/2017   CREATININE 0.98 02/20/2017   BUN 21 (H) 02/20/2017   CO2 26 02/20/2017   TSH 2.65 11/21/2016    Lab Results  Component Value Date   TSH 2.65 11/21/2016   Lab Results  Component Value Date   WBC 6.8 02/20/2017   HGB 13.6 02/20/2017   HCT 40.1 02/20/2017   MCV 87.6 02/20/2017   PLT 267 02/20/2017   Lab Results  Component  Value Date   NA 138 02/20/2017   K 4.4 02/20/2017   CO2 26 02/20/2017   GLUCOSE 108 (H) 02/20/2017   BUN 21 (H) 02/20/2017   CREATININE 0.98 02/20/2017   BILITOT 0.3 11/26/2016   ALKPHOS 50 11/26/2016   AST 14 11/26/2016   ALT 10 11/26/2016   PROT 7.3 11/26/2016   ALBUMIN 4.4 11/26/2016   CALCIUM 9.9 02/20/2017   ANIONGAP 10 02/20/2017   GFR 54.98 (L) 11/26/2016   Lab Results  Component Value Date   CHOL 221 (H) 02/21/2017   Lab Results  Component Value Date   HDL 44 02/21/2017   Lab Results  Component Value Date   LDLCALC 147 (H) 02/21/2017   Lab Results  Component Value Date   TRIG 149 02/21/2017   Lab Results  Component Value Date   CHOLHDL 5.0 02/21/2017   No results found for: HGBA1C     Assessment & Plan:   Problem List Items Addressed This Visit    Vitamin D deficiency    Daily supplements      Chest pain    Patient recently hospitalized with chest pain, work up was negative for any cardiac concern so far but she is awaiting an outpatient stress test and follow up with cardiology. She is asymptomatic at this time      Hypertension    Well controlled, no changes to meds. Encouraged heart healthy diet such as the DASH diet and exercise as tolerated.       HLD (hyperlipidemia)    Encouraged heart healthy diet, increase exercise, avoid trans fats, consider a krill oil cap daily         I am having Ms. Chokshi maintain her MULTI-VITAMIN GUMMIES, naproxen,  losartan-hydrochlorothiazide, cholecalciferol, acetaminophen, and ondansetron.  No orders of the defined types were placed in this encounter.    Penni Homans, MD

## 2017-03-02 NOTE — Assessment & Plan Note (Signed)
Well controlled, no changes to meds. Encouraged heart healthy diet such as the DASH diet and exercise as tolerated.  °

## 2017-03-02 NOTE — Assessment & Plan Note (Signed)
Encouraged heart healthy diet, increase exercise, avoid trans fats, consider a krill oil cap daily 

## 2017-03-02 NOTE — Assessment & Plan Note (Signed)
Patient recently hospitalized with chest pain, work up was negative for any cardiac concern so far but she is awaiting an outpatient stress test and follow up with cardiology. She is asymptomatic at this time

## 2017-03-02 NOTE — Assessment & Plan Note (Signed)
Daily supplements 

## 2017-03-03 ENCOUNTER — Encounter: Payer: Self-pay | Admitting: Family Medicine

## 2017-03-28 DIAGNOSIS — M5481 Occipital neuralgia: Secondary | ICD-10-CM | POA: Diagnosis not present

## 2017-03-28 DIAGNOSIS — M542 Cervicalgia: Secondary | ICD-10-CM | POA: Diagnosis not present

## 2017-03-28 DIAGNOSIS — M47812 Spondylosis without myelopathy or radiculopathy, cervical region: Secondary | ICD-10-CM | POA: Diagnosis not present

## 2017-04-09 ENCOUNTER — Ambulatory Visit: Payer: Medicare Other | Admitting: Cardiology

## 2017-04-22 ENCOUNTER — Other Ambulatory Visit: Payer: Self-pay | Admitting: Family Medicine

## 2017-04-22 DIAGNOSIS — I10 Essential (primary) hypertension: Secondary | ICD-10-CM | POA: Diagnosis not present

## 2017-04-22 DIAGNOSIS — Z1231 Encounter for screening mammogram for malignant neoplasm of breast: Secondary | ICD-10-CM

## 2017-04-22 DIAGNOSIS — M47892 Other spondylosis, cervical region: Secondary | ICD-10-CM | POA: Diagnosis not present

## 2017-04-22 DIAGNOSIS — M4722 Other spondylosis with radiculopathy, cervical region: Secondary | ICD-10-CM | POA: Diagnosis not present

## 2017-04-24 ENCOUNTER — Ambulatory Visit: Payer: Medicare Other | Admitting: Family Medicine

## 2017-04-24 DIAGNOSIS — Z23 Encounter for immunization: Secondary | ICD-10-CM | POA: Diagnosis not present

## 2017-04-25 ENCOUNTER — Other Ambulatory Visit: Payer: Self-pay | Admitting: Neurosurgery

## 2017-04-28 ENCOUNTER — Ambulatory Visit (HOSPITAL_BASED_OUTPATIENT_CLINIC_OR_DEPARTMENT_OTHER)
Admission: RE | Admit: 2017-04-28 | Discharge: 2017-04-28 | Disposition: A | Discharge status: Home / Self Care | Payer: Medicare Other | Source: Ambulatory Visit | Attending: Family | Admitting: Family

## 2017-04-28 ENCOUNTER — Encounter: Payer: Self-pay | Admitting: Family

## 2017-04-28 ENCOUNTER — Telehealth: Payer: Self-pay | Admitting: Family

## 2017-04-28 ENCOUNTER — Ambulatory Visit (INDEPENDENT_AMBULATORY_CARE_PROVIDER_SITE_OTHER): Payer: Medicare Other | Admitting: Family

## 2017-04-28 VITALS — BP 142/72 | HR 87 | Temp 98.3°F | Resp 16 | Ht 64.0 in

## 2017-04-28 DIAGNOSIS — M79672 Pain in left foot: Secondary | ICD-10-CM

## 2017-04-28 DIAGNOSIS — M79675 Pain in left toe(s): Secondary | ICD-10-CM | POA: Diagnosis not present

## 2017-04-28 HISTORY — PX: NM MYOVIEW LTD: HXRAD82

## 2017-04-28 LAB — URIC ACID: Uric Acid, Serum: 6.5 mg/dL (ref 2.4–7.0)

## 2017-04-28 MED ORDER — COLCHICINE 0.6 MG PO TABS: ORAL_TABLET | ORAL | 0 refills | Status: DC

## 2017-04-28 NOTE — Telephone Encounter (Signed)
Please contact patient and let her know that her x-ray is negative for fracture. Her gout test is upper limit of normal. It is possible that gout could be causing her discomfort. I would recommend that she take colchine as ordered. Let us know if symptoms worsen or if they're not improved in 24 hours. Please let her know that she may experience some brief diarrhea following the doses of colchicine.

## 2017-04-28 NOTE — Telephone Encounter (Signed)
Notified pt. Pt undecided if she wants to add another medication for a possible gout flare up when uric acid level is not yet above the normal limit. Advised pt that since xray is negative and uric acid level is at upper normal it could indicate a potential gout flare and colchicine should help prevent it from worsening if it is gout. Advised pt again to call if symptoms are not improved in 24 hours after taking medication. Pt states she wants Dr Frederik Pear opinion before she tries any other medication.  Please advise?

## 2017-04-28 NOTE — Patient Instructions (Signed)
Please complete lab work prior to leaving. Complete x-ray on the first floor.  

## 2017-04-28 NOTE — Telephone Encounter (Signed)
So any uric acid over 6 can be consistent with a gout flare. Colchicine is a good choice if it is gout. If the pain is tolerable the treatment is not necessary but if the pain is bad this is a good first treatement

## 2017-04-28 NOTE — Progress Notes (Signed)
Subjective:    Patient ID: Laura [name], female    DOB: 1940/12/26, 76 y.o.   MRN: [id number]  HPI  Laura [name] is a 76 yr old female who presents today with chief complaint of left sided foot pain. Has been present x 2 days.  Using Naproxen prn pain. Denies known trauma.   Review of Systems See HPI  Past Medical History:  Diagnosis Date  . 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 Cataract   . Chicken pox as a child  . CKD (chronic kidney disease), stage III ([place]) 02/21/2017  . DDD (degenerative disc disease), cervical   . EBV infection    mono as child  . Fibrocystic breast 05/01/2014  . GERD (gastroesophageal reflux disease)    pt denies  . Hyperlipidemia   . Hypertension 20 yrs ago  . Measles as a child  . Medicare annual wellness visit, subsequent 02/01/2015  . Mumps as a child  . Neck pain 01/22/2016  . Other and unspecified hyperlipidemia 10/13/2013  . Overweight(278.02)   . Vasovagal reaction 07/15/2015   no syncope but close      Social History   Social History  . Marital status: Married    Spouse name: N/A  . Number of children: N/A  . Years of education: N/A   Occupational History  . Not on file.   Social History Main Topics  . Smoking status: Former Smoker    Packs/day: 1.00    Years: 16.00    Types: Cigarettes    Start date: 07/29/1972  . Smokeless tobacco: Never Used  . Alcohol use No  . Drug use: No  . Sexual activity: No     Comment: lives with husband no major dietary restrictions   Other Topics Concern  . Not on file   Social History Narrative  . No narrative on file    Past Surgical History:  Procedure Laterality Date  . ABDOMINAL HYSTERECTOMY  29 yrs ago   heavy bleeding, ovaries left in place  . 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 COLONOSCOPY    . EYE SURGERY  11-27-2010   cataract surgery in both eyes  . POLYPECTOMY    . VAGINAL HYSTERECTOMY  76 yrs old   uterus removed     Family History  Problem Relation Age of Onset  . Heart disease Father   . Heart attack Father 75  . Hypertension Father   . Dementia Maternal Grandmother   . Heart disease Maternal Grandfather   . Hyperlipidemia Paternal Grandmother   . Cancer Paternal Grandfather        stomach  . Heart attack Paternal Grandfather   . Stomach cancer Paternal Grandfather   . Dementia Mother   . Pulmonary embolism Son   . Colon cancer Neg Hx   . Colon polyps Neg Hx   . Rectal cancer Neg Hx     Allergies  Allergen Reactions  . Lisinopril Other (See Comments)    dizzy  . Statins Other (See Comments)    Muscle soreness   . Sulfa Antibiotics Other (See Comments)    "feels feverish"  . Tetracyclines & Related Other (See Comments)    GI upset    Current Outpatient Prescriptions on File Prior to Visit  Medication Sig Dispense Refill  . acetaminophen (TYLENOL) 500 MG tablet Take 500 mg by mouth See admin instructions. In the morning and at lunch (supplements the Naproxen)    .  cholecalciferol (VITAMIN D) 1000 units tablet Take 1,000 Units by mouth daily.    Marland Kitchen losartan-hydrochlorothiazide (HYZAAR) 50-12.5 MG tablet TAKE 1 TABLET BY MOUTH DAILY. 90 tablet 0  . Multiple Vitamins-Minerals (MULTI-VITAMIN GUMMIES) CHEW Chew 1 tablet by mouth daily.    . naproxen (NAPROSYN) 500 MG tablet Take 500 mg by mouth at bedtime.   1   No current facility-administered medications on file prior to visit.     BP (!) 142/72 (BP Location: Right Arm, Cuff Size: Normal)   Pulse 87   Temp 98.3 F (36.8 C) (Oral)   Resp 16   Ht 5\' 4"  (1.626 m)   SpO2 100%       Objective:   Physical Exam  Constitutional: She is oriented to person, place, and time. She appears well-developed and well-nourished.  Cardiovascular: Normal rate, regular rhythm and normal heart sounds.   No murmur heard. Pulmonary/Chest: Effort normal and breath sounds normal. No respiratory distress. She has no wheezes.  Musculoskeletal:   Mild swelling at base of left second toe  Neurological: She is alert and oriented to person, place, and time.  Psychiatric: She has a normal mood and affect. Her behavior is normal. Judgment and thought content normal.          Assessment & Plan:  Foot pain- new.  Has osteopenia per last dexa. Will obtain uric acid to rule out gout and obtain x ray to rule out fracture.

## 2017-04-29 ENCOUNTER — Ambulatory Visit (INDEPENDENT_AMBULATORY_CARE_PROVIDER_SITE_OTHER): Payer: Medicare Other | Admitting: Family Medicine

## 2017-04-29 ENCOUNTER — Encounter: Payer: Self-pay | Admitting: Family Medicine

## 2017-04-29 VITALS — BP 126/78 | HR 74 | Temp 98.1°F | Resp 18

## 2017-04-29 DIAGNOSIS — M542 Cervicalgia: Secondary | ICD-10-CM

## 2017-04-29 DIAGNOSIS — M79675 Pain in left toe(s): Secondary | ICD-10-CM

## 2017-04-29 DIAGNOSIS — M79672 Pain in left foot: Secondary | ICD-10-CM

## 2017-04-29 DIAGNOSIS — E559 Vitamin D deficiency, unspecified: Secondary | ICD-10-CM | POA: Diagnosis not present

## 2017-04-29 DIAGNOSIS — I10 Essential (primary) hypertension: Secondary | ICD-10-CM | POA: Diagnosis not present

## 2017-04-29 NOTE — Telephone Encounter (Signed)
I can look at toe but agree cannot do surgical clearance today. Thanks

## 2017-04-29 NOTE — Telephone Encounter (Signed)
Notified pt. She states the 2nd toe on her left foot has "separated" from the big toe and it is only painful when she tries to walk. Pt believes she has a "tendon" issue instead of gout. Pt requests appointment with PCP. Scheduled appt for today at 3:30pm. Pt also states she wants to talk with PCP about an upcoming surgery. Advised pt if she needs surgical clearance we will need to schedule a separate appointment as that will need more time. Pt states she has already been cleared by Dr Larose Hires.

## 2017-04-29 NOTE — Patient Instructions (Signed)
Tylenol ES 500 mg tab,1 tab twice every day Apply aspercreme,,icy hot or salon pas, aspirin and then Lidocaine gel Hammer Toe Hammer toe is a change in the shape (a deformity) of your second, third, or fourth toe. The deformity causes the middle joint of your toe to stay bent. This causes pain, especially when you are wearing shoes. Hammer toe starts gradually. At first, the toe can be straightened. Gradually over time, the deformity becomes stiff and permanent. Early treatments to keep the toe straight may relieve pain. As the deformity becomes stiff and permanent, surgery may be needed to straighten the toe. What are the causes? Hammer toe is caused by abnormal bending of the toe joint that is closest to your foot. It happens gradually over time. This pulls on the muscles and connections (tendons) of the toe joint, making them weak and stiff. It is often related to wearing shoes that are too short or narrow and do not let your toes straighten. What increases the risk? You may be at greater risk for hammer toe if you:  Are female.  Are older.  Wear shoes that are too small.  Wear high-heeled shoes that pinch your toes.  Are a Engineer, mining.  Have a second toe that is longer than your big toe (first toe).  Injure your foot or toe.  Have arthritis.  Have a family history of hammer toe.  Have a nerve or muscle disorder.  What are the signs or symptoms? The main symptoms of this condition are pain and deformity of the toe. The pain is worse when wearing shoes, walking, or running. Other symptoms may include:  Corns or calluses over the bent part of the toe or between the toes.  Redness and a burning feeling on the toe.  An open sore that forms on the top of the toe.  Not being able to straighten the toe.  How is this diagnosed? This condition is diagnosed based on your symptoms and a physical exam. During the exam, your health care provider will try to straighten your toe to  see how stiff the deformity is. You may also have tests, such as:  A blood test to check for rheumatoid arthritis.  An X-ray to show how severe the deformity is.  How is this treated? Treatment for this condition will depend on how stiff the deformity is. Surgery is often needed. However, sometimes a hammer toe can be straightened without surgery. Treatments that do not involve surgery include:  Taping the toe into a straightened position.  Using pads and cushions to protect the toe (orthotics).  Wearing shoes that provide enough room for the toes.  Doing toe-stretching exercises at home.  Taking an NSAID to reduce pain and swelling.  If these treatments do not help or the toe cannot be straightened, surgery is the next option. The most common surgeries used to straighten a hammer toe include:  Arthroplasty. In this procedure, part of the joint is removed, and that allows the toe to straighten.  Fusion. In this procedure, cartilage between the two bones of the joint is taken out and the bones are fused together into one longer bone.  Implantation. In this procedure, part of the bone is removed and replaced with an implant to let the toe move again.  Flexor tendon transfer. In this procedure, the tendons that curl the toes down (flexor tendons) are repositioned.  Follow these instructions at home:  Take over-the-counter and prescription medicines only as told by your  health care provider.  Do toe straightening and stretching exercises as told by your health care provider.  Keep all follow-up visits as told by your health care provider. This is important. How is this prevented?  Wear shoes that give your toes enough room and do not cause pain.  Do not wear high-heeled shoes. Contact a health care provider if:  Your pain gets worse.  Your toe becomes red or swollen.  You develop an open sore on your toe. This information is not intended to replace advice given to you by  your health care provider. Make sure you discuss any questions you have with your health care provider. Document Released: 07/12/2000 Document Revised: 02/02/2016 Document Reviewed: 11/08/2015 Elsevier Interactive Patient Education  [employer].

## 2017-04-29 NOTE — Progress Notes (Signed)
Subjective:  I acted as a Education administrator for Dr. Charlett Blake. [place], [place]  Patient ID: Laura [name], female    DOB: 1941-06-15, 76 y.o.   MRN: [id number]  Chief Complaint  Patient presents with  . Claudication    HPI  Patient is in today for a acute visit for left foot pain. She is noting worsening pain at the base of her second toe on her left foot.she denies any injury or trauma. She denies any redness or warmth. She notes the pain occurs when she is walking on it and baring weight. No other acute illness or hospitalization. She is preparing to undergo surgery to her neck and is anxious about this but ready to proceed secondary to her level of pain. Denies CP/palp/SOB/HA/congestion/fevers/GI or GU c/o. Taking meds as prescribed  Patient Care Team: [name], MD as PCP - General (Family Medicine) Calvert Cantor, MD as Consulting Physician (Ophthalmology) Laroy Apple, MD as Consulting Physician (Physical Medicine and Rehabilitation) Ashok Pall, MD as Consulting Physician (Neurosurgery) Almedia Balls, MD as Consulting Physician (Orthopedic Surgery)   Past Medical History:  Diagnosis Date  . Cancer ([place])    basal on back and nose  . Cataract   . Chicken pox as a child  . CKD (chronic kidney disease), stage III ([place]) 02/21/2017  . DDD (degenerative disc disease), cervical   . EBV infection    mono as child  . Fibrocystic breast 05/01/2014  . GERD (gastroesophageal reflux disease)    pt denies  . Hyperlipidemia   . Hypertension 20 yrs ago  . Measles as a child  . Medicare annual wellness visit, subsequent 02/01/2015  . Mumps as a child  . Neck pain 01/22/2016  . Other and unspecified hyperlipidemia 10/13/2013  . Overweight(278.02)   . Vasovagal reaction 07/15/2015   no syncope but close     Past Surgical History:  Procedure Laterality Date  . ABDOMINAL HYSTERECTOMY  29 yrs ago   heavy bleeding, ovaries left in place  . 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 COLONOSCOPY    . EYE SURGERY  11-27-2010   cataract surgery in both eyes  . POLYPECTOMY    . VAGINAL HYSTERECTOMY  76 yrs old   uterus removed    Family History  Problem Relation Age of Onset  . Heart disease Father   . Heart attack Father 28  . Hypertension Father   . Dementia Maternal Grandmother   . Heart disease Maternal Grandfather   . Hyperlipidemia Paternal Grandmother   . Cancer Paternal Grandfather        stomach  . Heart attack Paternal Grandfather   . Stomach cancer Paternal Grandfather   . Dementia Mother   . Pulmonary embolism Son   . Colon cancer Neg Hx   . Colon polyps Neg Hx   . Rectal cancer Neg Hx     Social History   Social History  . Marital status: Married    Spouse name: N/A  . Number of children: N/A  . Years of education: N/A   Occupational History  . Not on file.   Social History Main Topics  . Smoking status: Former Smoker    Packs/day: 1.00    Years: 16.00    Types: Cigarettes    Start date: 07/29/1972  . Smokeless tobacco: Never Used  . Alcohol use No  . Drug use: No  . Sexual activity: No     Comment: lives with husband  no major dietary restrictions   Other Topics Concern  . Not on file   Social History Narrative  . No narrative on file    Outpatient Medications Prior to Visit  Medication Sig Dispense Refill  . acetaminophen (TYLENOL) 500 MG tablet Take 500 mg by mouth See admin instructions. In the morning and at lunch (supplements the Naproxen)    . cholecalciferol (VITAMIN D) 1000 units tablet Take 1,000 Units by mouth daily.    . colchicine 0.6 MG tablet 2 tabs by mouth now, followed by 1 tab one hour later. 3 tablet 0  . losartan-hydrochlorothiazide (HYZAAR) 50-12.5 MG tablet TAKE 1 TABLET BY MOUTH DAILY. 90 tablet 0  . Multiple Vitamins-Minerals (MULTI-VITAMIN GUMMIES) CHEW Chew 1 tablet by mouth daily.    . naproxen (NAPROSYN) 500 MG tablet Take 500 mg by mouth at bedtime.   1   No  facility-administered medications prior to visit.     Allergies  Allergen Reactions  . Lisinopril Other (See Comments)    dizzy  . Statins Other (See Comments)    Muscle soreness   . Sulfa Antibiotics Other (See Comments)    "feels feverish"  . Tetracyclines & Related Other (See Comments)    GI upset    Review of Systems  Constitutional: Negative for chills, fever and malaise/fatigue.  HENT: Negative for congestion and hearing loss.   Eyes: Negative for discharge.  Respiratory: Negative for cough, sputum production and shortness of breath.   Cardiovascular: Negative for chest pain, palpitations and leg swelling.  Gastrointestinal: Negative for abdominal pain, blood in stool, constipation, diarrhea, heartburn, nausea and vomiting.  Genitourinary: Negative for dysuria, frequency, hematuria and urgency.  Musculoskeletal: Positive for joint pain. Negative for back pain, falls and myalgias.  Skin: Negative for rash.  Neurological: Negative for dizziness, sensory change, loss of consciousness, weakness and headaches.  Endo/Heme/Allergies: Negative for environmental allergies. Does not bruise/bleed easily.  Psychiatric/Behavioral: Negative for depression and suicidal ideas. The patient is not nervous/anxious and does not have insomnia.        Objective:    Physical Exam  Constitutional: She is oriented to person, place, and time. She appears well-developed and well-nourished. No distress.  HENT:  Head: Normocephalic and atraumatic.  Eyes: Conjunctivae are normal.  Neck: Neck supple. No thyromegaly present.  Cardiovascular: Normal rate, regular rhythm and normal heart sounds.   No murmur heard. Pulmonary/Chest: Effort normal and breath sounds normal. No respiratory distress.  Abdominal: Soft. Bowel sounds are normal. She exhibits no distension and no mass. There is no tenderness.  Musculoskeletal: She exhibits no edema.  2nd toe is twisted and tender to palpation at the base. No  erythema or warmth  Lymphadenopathy:    She has no cervical adenopathy.  Neurological: She is alert and oriented to person, place, and time.  Skin: Skin is warm and dry.  Psychiatric: She has a normal mood and affect. Her behavior is normal.    BP 126/78 (BP Location: Left Arm, Patient Position: Sitting, Cuff Size: Normal)   Pulse 74   Temp 98.1 F (36.7 C) (Oral)   Resp 18   SpO2 92%  Wt Readings from Last 3 Encounters:  04/30/17 170 lb (77.1 kg)  02/20/17 175 lb 11.2 oz (79.7 kg)  07/15/16 165 lb (74.8 kg)   BP Readings from Last 3 Encounters:  04/30/17 137/85  04/29/17 126/78  04/28/17 (!) 142/72     Immunization History  Administered Date(s) Administered  . Influenza, High Dose Seasonal PF  05/07/2016, 04/24/2017, 04/24/2017  . Influenza,inj,Quad PF,6+ Mos 04/29/2014, 07/10/2015  . Influenza-Unspecified 04/28/2013  . Pneumococcal Conjugate-13 04/29/2014  . Pneumococcal Polysaccharide-23 07/04/2009, 08/08/2010, 04/28/2013  . Td 07/29/2010  . Zoster 04/28/2013    Health Maintenance  Topic Date Due  . HEMOGLOBIN A1C  [birth year]  . FOOT EXAM  05/17/1952  . OPHTHALMOLOGY EXAM  05/17/1952  . MAMMOGRAM  05/23/2018  . TETANUS/TDAP  07/29/2020  . COLONOSCOPY  09/18/2020  . INFLUENZA VACCINE  Completed  . DEXA SCAN  Completed  . PNA vac Low Risk Adult  Completed    Lab Results  Component Value Date   WBC 6.8 02/20/2017   HGB 13.6 02/20/2017   HCT 40.1 02/20/2017   PLT 267 02/20/2017   GLUCOSE 108 (H) 02/20/2017   CHOL 221 (H) 02/21/2017   TRIG 149 02/21/2017   HDL 44 02/21/2017   LDLCALC 147 (H) 02/21/2017   ALT 10 11/26/2016   AST 14 11/26/2016   NA 138 02/20/2017   K 4.4 02/20/2017   CL 102 02/20/2017   CREATININE 0.98 02/20/2017   BUN 21 (H) 02/20/2017   CO2 26 02/20/2017   TSH 2.65 11/21/2016    Lab Results  Component Value Date   TSH 2.65 11/21/2016   Lab Results  Component Value Date   WBC 6.8 02/20/2017   HGB 13.6 02/20/2017   HCT 40.1  02/20/2017   MCV 87.6 02/20/2017   PLT 267 02/20/2017   Lab Results  Component Value Date   NA 138 02/20/2017   K 4.4 02/20/2017   CO2 26 02/20/2017   GLUCOSE 108 (H) 02/20/2017   BUN 21 (H) 02/20/2017   CREATININE 0.98 02/20/2017   BILITOT 0.3 11/26/2016   ALKPHOS 50 11/26/2016   AST 14 11/26/2016   ALT 10 11/26/2016   PROT 7.3 11/26/2016   ALBUMIN 4.4 11/26/2016   CALCIUM 9.9 02/20/2017   ANIONGAP 10 02/20/2017   GFR 54.98 (L) 11/26/2016   Lab Results  Component Value Date   CHOL 221 (H) 02/21/2017   Lab Results  Component Value Date   HDL 44 02/21/2017   Lab Results  Component Value Date   LDLCALC 147 (H) 02/21/2017   Lab Results  Component Value Date   TRIG 149 02/21/2017   Lab Results  Component Value Date   CHOLHDL 5.0 02/21/2017   No results found for: HGBA1C       Assessment & Plan:   Problem List Items Addressed This Visit    Vitamin D deficiency    Encouraged daily supplements in preparation of surgery.       Neck pain    Is scheduled for surgery due to her worsening symptoms and pain. Is nervous but ready to proceed.       Hypertension    Well controlled, no changes to meds. Encouraged heart healthy diet such as the DASH diet and exercise as tolerated.       Left foot pain    Unclear etiology but pain warrants investigation. Encouraged to use topical treatments and is referred to sports med for further consideration       Other Visit Diagnoses    Great toe pain, left    -  Primary   Relevant Orders   Ambulatory referral to Sports Medicine      I am having Ms. Brandhorst maintain her MULTI-VITAMIN GUMMIES, naproxen, losartan-hydrochlorothiazide, cholecalciferol, acetaminophen, and colchicine.  No orders of the defined types were placed in this encounter.   CMA served as  scribe during this visit. History, Physical and Plan performed by medical provider. Documentation and orders reviewed and attested to.  Penni Homans, MD

## 2017-04-30 ENCOUNTER — Encounter: Payer: Self-pay | Admitting: Family Medicine

## 2017-04-30 ENCOUNTER — Ambulatory Visit (INDEPENDENT_AMBULATORY_CARE_PROVIDER_SITE_OTHER): Payer: Medicare Other | Admitting: Family Medicine

## 2017-04-30 DIAGNOSIS — M79672 Pain in left foot: Secondary | ICD-10-CM | POA: Diagnosis not present

## 2017-04-30 NOTE — Patient Instructions (Signed)
You have metatarsalgia but also a calcification in the joint of the 3rd toe that pushes this toe laterally. There are three things you can do but I'm concerned they won't permanently fix this without continuing to use them: buddy taping, great toe spacer, and metatarsal pads. If you want [place] to place metatarsal pads in an insert for a tennis shoe or in some of your other shoes, bring these by for [place].

## 2017-05-01 DIAGNOSIS — M79672 Pain in left foot: Secondary | ICD-10-CM | POA: Insufficient documentation

## 2017-05-01 NOTE — Progress Notes (Signed)
PCP: [name], MD  Subjective:   HPI: Patient is a 76 y.o. female here for left toe pain.  Patient reports she's noticed increased pain over past 3-4 days in 2nd, 3rd toes with them splitting apart from each other. Worse with walking. No new injury or trauma. Pain 0/10 at rest but up to 3/10 and sharp at times. Better with rest. Takes naproxen.  Has not tried anything else for this. No skin changes, numbness  Past Medical History:  Diagnosis Date  . 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 Cataract   . Chicken pox as a child  . CKD (chronic kidney disease), stage III ([place]) 02/21/2017  . DDD (degenerative disc disease), cervical   . EBV infection    mono as child  . Fibrocystic breast 05/01/2014  . GERD (gastroesophageal reflux disease)    pt denies  . Hyperlipidemia   . Hypertension 20 yrs ago  . Measles as a child  . Medicare annual wellness visit, subsequent 02/01/2015  . Mumps as a child  . Neck pain 01/22/2016  . Other and unspecified hyperlipidemia 10/13/2013  . Overweight(278.02)   . Vasovagal reaction 07/15/2015   no syncope but close     Current Outpatient Prescriptions on File Prior to Visit  Medication Sig Dispense Refill  . acetaminophen (TYLENOL) 500 MG tablet Take 500 mg by mouth See admin instructions. In the morning and at lunch (supplements the Naproxen)    . cholecalciferol (VITAMIN D) 1000 units tablet Take 1,000 Units by mouth daily.    . colchicine 0.6 MG tablet 2 tabs by mouth now, followed by 1 tab one hour later. 3 tablet 0  . losartan-hydrochlorothiazide (HYZAAR) 50-12.5 MG tablet TAKE 1 TABLET BY MOUTH DAILY. 90 tablet 0  . Multiple Vitamins-Minerals (MULTI-VITAMIN GUMMIES) CHEW Chew 1 tablet by mouth daily.    . naproxen (NAPROSYN) 500 MG tablet Take 500 mg by mouth at bedtime.   1   No current facility-administered medications on file prior to visit.     Past Surgical History:  Procedure Laterality Date  . ABDOMINAL HYSTERECTOMY  30  yrs ago   heavy bleeding, ovaries left in place  . 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 COLONOSCOPY    . EYE SURGERY  11-27-2010   cataract surgery in both eyes  . POLYPECTOMY    . VAGINAL HYSTERECTOMY  75 yrs old   uterus removed    Allergies  Allergen Reactions  . Lisinopril Other (See Comments)    dizzy  . Statins Other (See Comments)    Muscle soreness   . Sulfa Antibiotics Other (See Comments)    "feels feverish"  . Tetracyclines & Related Other (See Comments)    GI upset    Social History   Social History  . Marital status: Married    Spouse name: N/A  . Number of children: N/A  . Years of education: N/A   Occupational History  . Not on file.   Social History Main Topics  . Smoking status: Former Smoker    Packs/day: 1.00    Years: 16.00    Types: Cigarettes    Start date: 07/29/1972  . Smokeless tobacco: Never Used  . Alcohol use No  . Drug use: No  . Sexual activity: No     Comment: lives with husband no major dietary restrictions   Other Topics Concern  . Not on file   Social  History Narrative  . No narrative on file    Family History  Problem Relation Age of Onset  . Heart disease Father   . Heart attack Father 30  . Hypertension Father   . Dementia Maternal Grandmother   . Heart disease Maternal Grandfather   . Hyperlipidemia Paternal Grandmother   . Cancer Paternal Grandfather        stomach  . Heart attack Paternal Grandfather   . Stomach cancer Paternal Grandfather   . Dementia Mother   . Pulmonary embolism Son   . Colon cancer Neg Hx   . Colon polyps Neg Hx   . Rectal cancer Neg Hx     BP 137/85   Pulse 74   Ht 5\' 4"  (1.626 m)   Wt 170 lb (77.1 kg)   BMI 29.18 kg/m   Review of Systems: See HPI above.     Objective:  Physical Exam:  Gen: NAD, comfortable in exam room  Left foot/ankle: Splaying of 2nd and 3rd digits.  Transverse arch collapse.  No other obvious deformities.   No swelling, malrotation, other angulation. FROM ankle without pain.  No pain digit flexion and extension TTP mildly 2nd and 3rd metatarsal heads. Negative ant drawer and talar tilt.   Negative syndesmotic compression. Negative metatarsal squeeze. Thompsons test negative. NV intact distally.   Assessment & Plan:  1. Left foot pain - 2/2 metatarsalgia primarily.  Independently reviewed radiographs and she does appear to have a calcification medial aspect of 3rd MTP joint.  She has no history of pseudogout or injury to account for this (and no donor site noted).  Combination of these two factors causing splaying.  Metatarsal pads placed (she will consider bringing most common shoes she wears for [place] to do this).  Encouraged tennis shoe but reports she does not wear these.  Buddy taping, toe spacer to help bring 2nd and 3rd digits together.  Tylenol if needed for pain.

## 2017-05-01 NOTE — Assessment & Plan Note (Signed)
2/2 metatarsalgia primarily.  Independently reviewed radiographs and she does appear to have a calcification medial aspect of 3rd MTP joint.  She has no history of pseudogout or injury to account for this (and no donor site noted).  Combination of these two factors causing splaying.  Metatarsal pads placed (she will consider bringing most common shoes she wears for [place] to do this).  Encouraged tennis shoe but reports she does not wear these.  Buddy taping, toe spacer to help bring 2nd and 3rd digits together.  Tylenol if needed for pain.

## 2017-05-04 NOTE — Assessment & Plan Note (Signed)
Is scheduled for surgery due to her worsening symptoms and pain. Is nervous but ready to proceed.

## 2017-05-04 NOTE — Assessment & Plan Note (Signed)
Unclear etiology but pain warrants investigation. Encouraged to use topical treatments and is referred to sports med for further consideration

## 2017-05-04 NOTE — Assessment & Plan Note (Signed)
Encouraged daily supplements in preparation of surgery.

## 2017-05-04 NOTE — Assessment & Plan Note (Signed)
Well controlled, no changes to meds. Encouraged heart healthy diet such as the DASH diet and exercise as tolerated.  °

## 2017-05-08 ENCOUNTER — Other Ambulatory Visit (HOSPITAL_COMMUNITY): Payer: Self-pay | Admitting: *Deleted

## 2017-05-08 NOTE — Pre-Procedure Instructions (Signed)
    Keilyn Haggard  05/08/2017      [id number] - [place], [place] - [address] AT [place] [address] Phone: [phone] Fax: [phone]   Your procedure is scheduled on Thursday, May 15, 2017 at 12:30 PM.   Report to [facility] Entrance "A" Admitting Office at 10:30 AM.   Call this number if you have problems the morning of surgery: [phone]   Questions prior to day of surgery, please call [phone] between 8 & 4 PM.   Remember:  Do not eat food or drink liquids after midnight Wednesday, 05/14/17.  Take these medicines the morning of surgery with A SIP OF WATER: Tylenol - if needed  Stop NSAIDS (Naproxen, Ibuprofen, Aleve, etc) 5 days prior to surgery. Do not use Aspirin products 5 days prior to surgery.   Do not wear jewelry, make-up or nail polish.  Do not wear lotions, powders, perfumes or deodorant.  Do not shave 48 hours prior to surgery.   Do not bring valuables to the hospital.  [facility] is not responsible for any belongings or valuables.  Contacts, dentures or bridgework may not be worn into surgery.  Leave your suitcase in the car.  After surgery it may be brought to your room.  For patients admitted to the hospital, discharge time will be determined by your treatment team.  Special instructions:  See "Preparing for Surgery" Instruction sheet.  Please read over the fact sheets that you were given.

## 2017-05-09 ENCOUNTER — Encounter (HOSPITAL_COMMUNITY)
Admission: RE | Admit: 2017-05-09 | Discharge: 2017-05-09 | Disposition: A | Discharge status: Home / Self Care | Payer: Medicare Other | Source: Ambulatory Visit | Attending: Neurosurgery | Admitting: Neurosurgery

## 2017-05-09 ENCOUNTER — Encounter (HOSPITAL_COMMUNITY): Payer: Self-pay

## 2017-05-09 DIAGNOSIS — Z87891 Personal history of nicotine dependence: Secondary | ICD-10-CM | POA: Diagnosis not present

## 2017-05-09 DIAGNOSIS — Z01812 Encounter for preprocedural laboratory examination: Secondary | ICD-10-CM | POA: Diagnosis not present

## 2017-05-09 DIAGNOSIS — K219 Gastro-esophageal reflux disease without esophagitis: Secondary | ICD-10-CM | POA: Diagnosis not present

## 2017-05-09 DIAGNOSIS — M4722 Other spondylosis with radiculopathy, cervical region: Secondary | ICD-10-CM | POA: Insufficient documentation

## 2017-05-09 DIAGNOSIS — N183 Chronic kidney disease, stage 3 (moderate): Secondary | ICD-10-CM | POA: Insufficient documentation

## 2017-05-09 DIAGNOSIS — I129 Hypertensive chronic kidney disease with stage 1 through stage 4 chronic kidney disease, or unspecified chronic kidney disease: Secondary | ICD-10-CM | POA: Insufficient documentation

## 2017-05-09 LAB — BASIC METABOLIC PANEL
Anion gap: 10 (ref 5–15)
BUN: 16 mg/dL (ref 6–20)
CO2: 24 mmol/L (ref 22–32)
Calcium: 9.7 mg/dL (ref 8.9–10.3)
Chloride: 103 mmol/L (ref 101–111)
Creatinine, Ser: 0.96 mg/dL (ref 0.44–1.00)
GFR calc Af Amer: >60 mL/min (ref >60)
GFR calc non Af Amer: 57 mL/min — ABNORMAL LOW (ref >60)
Glucose, Bld: 92 mg/dL (ref 65–99)
Potassium: 4.2 mmol/L (ref 3.5–5.1)
Sodium: 137 mmol/L (ref 135–145)

## 2017-05-09 LAB — TYPE AND SCREEN
ABO/RH(D): A POS
Antibody Screen: NEGATIVE

## 2017-05-09 LAB — CBC
HCT: 42 % (ref 36.0–46.0)
Hemoglobin: 13.6 g/dL (ref 12.0–15.0)
MCH: 29 pg (ref 26.0–34.0)
MCHC: 32.4 g/dL (ref 30.0–36.0)
MCV: 89.6 fL (ref 78.0–100.0)
Platelets: 274 10*3/uL (ref 150–400)
RBC: 4.69 MIL/uL (ref 3.87–5.11)
RDW: 14.1 % (ref 11.5–15.5)
WBC: 6.7 10*3/uL (ref 4.0–10.5)

## 2017-05-09 LAB — ABO/RH: ABO/RH(D): A POS

## 2017-05-09 LAB — SURGICAL PCR SCREEN
MRSA, PCR: NEGATIVE
Staphylococcus aureus: NEGATIVE

## 2017-05-09 MED ORDER — CHLORHEXIDINE GLUCONATE CLOTH 2 % EX PADS: 6.0000 | MEDICATED_PAD | Freq: Once | CUTANEOUS | Status: DC

## 2017-05-12 NOTE — Progress Notes (Signed)
Anesthesia Chart Review: Patient is a 76 year old female scheduled for ACDF C3-4, C4-5, C5-6 on 05/15/17 by Dr. Ashok Pall.  History includes former smoker, HTN, skin cancer (BCC), HLD, GERD (denied), CKD stage III, hysterectomy. Admission for chest pain 7/26-7/27/18. Troponin negative X 4, normal LVEF, stable EKG. Outpatient stress test arranged which was normal.   PCP is Dr. Penni Homans, last visit 05/04/17. She is aware of planned surgery.    Meds include losartan-HCTZ.  BP (!) 168/67   Pulse 94   Temp 36.7 C (Oral)   Resp 20   Ht 5\' 4"  (1.626 m)   Wt 174 lb 5 oz (79.1 kg)   SpO2 95%   BMI 29.92 kg/m   EKG 02/20/17: SR, non-specific ST changes V4-6.  Nuclear stress test 02/28/17:  Nuclear stress EF: 66%.  The left ventricular ejection fraction is hyperdynamic (>65%).  Blood pressure demonstrated a normal response to exercise.  There was no ST segment deviation noted during stress.  The study is normal. Normal stress nuclear study with no ischemia or infarction; EF 66 with normal wall motion.  Echo 02/21/17: Study Conclusions - Left ventricle: The cavity size was normal. Wall thickness was   normal. Systolic function was normal. The estimated ejection   fraction was in the range of 60% to 65%. Wall motion was normal;   there were no regional wall motion abnormalities. Doppler   parameters are consistent with abnormal left ventricular   relaxation (grade 1 diastolic dysfunction). The E/e&' ratio is <8,   suggesting normal LV filling pressure. - Left atrium: The atrium was normal in size. - Tricuspid valve: There was trivial regurgitation. - Pulmonary arteries: PA peak pressure: 35 mm Hg (S). - Inferior vena cava: The vessel was dilated. The respirophasic   diameter changes were blunted (< 50%), consistent with elevated   central venous pressure. Impressions: - LVEF 60-65%, normal wall thickness, normal wall motion, grade 1   DD, normal LV filling pressure, normal LA  size, trivial TR, RVSP   35 mmHg, dilated IVC.  CXR 02/20/17: IMPRESSION: No active cardiopulmonary disease.  Preoperative labs noted.   If no acute changes then I anticipate that she can proceed as planned.  George Hugh [facility] Short Stay Center/Anesthesiology Phone [phone] 05/12/2017 1:43 PM

## 2017-05-15 ENCOUNTER — Encounter (HOSPITAL_COMMUNITY)
Admission: RE | Disposition: A | Discharge status: Home / Self Care | Payer: Self-pay | Source: Ambulatory Visit | Attending: Neurosurgery

## 2017-05-15 ENCOUNTER — Inpatient Hospital Stay (HOSPITAL_COMMUNITY): Payer: Medicare Other

## 2017-05-15 ENCOUNTER — Encounter (HOSPITAL_COMMUNITY): Payer: Self-pay

## 2017-05-15 ENCOUNTER — Inpatient Hospital Stay (HOSPITAL_COMMUNITY): Payer: Medicare Other | Admitting: Certified Registered Nurse Anesthetist

## 2017-05-15 ENCOUNTER — Inpatient Hospital Stay (HOSPITAL_COMMUNITY)
Admission: RE | Admit: 2017-05-15 | Discharge: 2017-05-16 | DRG: 473 | Disposition: A | Discharge status: Home / Self Care | Payer: Medicare Other | Source: Ambulatory Visit | Attending: Neurosurgery | Admitting: Neurosurgery

## 2017-05-15 ENCOUNTER — Inpatient Hospital Stay (HOSPITAL_COMMUNITY): Payer: Medicare Other | Admitting: Vascular Surgery

## 2017-05-15 DIAGNOSIS — K219 Gastro-esophageal reflux disease without esophagitis: Secondary | ICD-10-CM | POA: Diagnosis not present

## 2017-05-15 DIAGNOSIS — Z90710 Acquired absence of both cervix and uterus: Secondary | ICD-10-CM | POA: Diagnosis not present

## 2017-05-15 DIAGNOSIS — M4722 Other spondylosis with radiculopathy, cervical region: Secondary | ICD-10-CM | POA: Diagnosis not present

## 2017-05-15 DIAGNOSIS — M4322 Fusion of spine, cervical region: Secondary | ICD-10-CM | POA: Diagnosis not present

## 2017-05-15 DIAGNOSIS — I129 Hypertensive chronic kidney disease with stage 1 through stage 4 chronic kidney disease, or unspecified chronic kidney disease: Secondary | ICD-10-CM | POA: Diagnosis not present

## 2017-05-15 DIAGNOSIS — Z85828 Personal history of other malignant neoplasm of skin: Secondary | ICD-10-CM | POA: Diagnosis not present

## 2017-05-15 DIAGNOSIS — E785 Hyperlipidemia, unspecified: Secondary | ICD-10-CM | POA: Diagnosis present

## 2017-05-15 DIAGNOSIS — Z87891 Personal history of nicotine dependence: Secondary | ICD-10-CM | POA: Diagnosis not present

## 2017-05-15 DIAGNOSIS — M47812 Spondylosis without myelopathy or radiculopathy, cervical region: Secondary | ICD-10-CM | POA: Diagnosis present

## 2017-05-15 DIAGNOSIS — I10 Essential (primary) hypertension: Secondary | ICD-10-CM | POA: Diagnosis not present

## 2017-05-15 DIAGNOSIS — N183 Chronic kidney disease, stage 3 (moderate): Secondary | ICD-10-CM | POA: Diagnosis not present

## 2017-05-15 DIAGNOSIS — Z419 Encounter for procedure for purposes other than remedying health state, unspecified: Secondary | ICD-10-CM

## 2017-05-15 HISTORY — PX: ANTERIOR CERVICAL DECOMP/DISCECTOMY FUSION: SHX1161

## 2017-05-15 HISTORY — DX: Other specified postprocedural states: Z98.890

## 2017-05-15 HISTORY — DX: Nausea with vomiting, unspecified: R11.2

## 2017-05-15 SURGERY — ANTERIOR CERVICAL DECOMPRESSION/DISCECTOMY FUSION 2 LEVELS
Anesthesia: General | Site: Spine Cervical

## 2017-05-15 MED ORDER — SUCCINYLCHOLINE CHLORIDE 200 MG/10ML IV SOSY
PREFILLED_SYRINGE | INTRAVENOUS | Status: AC
  Filled 2017-05-15: qty 10

## 2017-05-15 MED ORDER — GABAPENTIN 300 MG PO CAPS
300.0000 mg | ORAL_CAPSULE | Freq: Three times a day (TID) | ORAL | Status: DC
  Filled 2017-05-15 (×2): qty 1

## 2017-05-15 MED ORDER — FENTANYL CITRATE (PF) 250 MCG/5ML IJ SOLN
INTRAMUSCULAR | Status: AC
  Filled 2017-05-15: qty 5

## 2017-05-15 MED ORDER — LACTATED RINGERS IV SOLN
INTRAVENOUS | Status: DC | PRN
  Administered 2017-05-15 (×2): via INTRAVENOUS

## 2017-05-15 MED ORDER — ZOLPIDEM TARTRATE 5 MG PO TABS: 5.0000 mg | ORAL_TABLET | Freq: Every evening | ORAL | Status: DC | PRN

## 2017-05-15 MED ORDER — KETOROLAC TROMETHAMINE 0.5 % OP SOLN
1.0000 [drp] | Freq: Three times a day (TID) | OPHTHALMIC | Status: DC | PRN
  Filled 2017-05-15: qty 3

## 2017-05-15 MED ORDER — LIDOCAINE-EPINEPHRINE 0.5 %-1:200000 IJ SOLN
INTRAMUSCULAR | Status: AC
  Filled 2017-05-15: qty 1

## 2017-05-15 MED ORDER — CEFAZOLIN SODIUM-DEXTROSE 2-4 GM/100ML-% IV SOLN
2.0000 g | INTRAVENOUS | Status: AC
  Administered 2017-05-15: 2 g via INTRAVENOUS
  Filled 2017-05-15: qty 100

## 2017-05-15 MED ORDER — PROPOFOL 10 MG/ML IV BOLUS
INTRAVENOUS | Status: DC | PRN
  Administered 2017-05-15: 150 mg via INTRAVENOUS

## 2017-05-15 MED ORDER — ACETAMINOPHEN 500 MG PO TABS
1000.0000 mg | ORAL_TABLET | Freq: Four times a day (QID) | ORAL | Status: AC
  Administered 2017-05-15 – 2017-05-16 (×2): 1000 mg via ORAL
  Filled 2017-05-15 (×2): qty 2

## 2017-05-15 MED ORDER — VITAMIN D 1000 UNITS PO TABS
1000.0000 [IU] | ORAL_TABLET | Freq: Every day | ORAL | Status: DC
  Administered 2017-05-16: 1000 [IU] via ORAL
  Filled 2017-05-15: qty 1

## 2017-05-15 MED ORDER — LIDOCAINE HCL (CARDIAC) 20 MG/ML IV SOLN
INTRAVENOUS | Status: DC | PRN
  Administered 2017-05-15: 60 mg via INTRAVENOUS

## 2017-05-15 MED ORDER — PHENOL 1.4 % MT LIQD
1.0000 | OROMUCOSAL | Status: DC | PRN
  Administered 2017-05-16: 1 via OROMUCOSAL
  Filled 2017-05-15: qty 177

## 2017-05-15 MED ORDER — DEXAMETHASONE SODIUM PHOSPHATE 10 MG/ML IJ SOLN
INTRAMUSCULAR | Status: DC | PRN
  Administered 2017-05-15: 10 mg via INTRAVENOUS

## 2017-05-15 MED ORDER — SODIUM CHLORIDE 0.9% FLUSH
3.0000 mL | Freq: Two times a day (BID) | INTRAVENOUS | Status: DC
  Administered 2017-05-15: 3 mL via INTRAVENOUS

## 2017-05-15 MED ORDER — OXYCODONE HCL ER 10 MG PO T12A
10.0000 mg | EXTENDED_RELEASE_TABLET | Freq: Two times a day (BID) | ORAL | Status: DC
  Administered 2017-05-15: 10 mg via ORAL
  Filled 2017-05-15 (×2): qty 1

## 2017-05-15 MED ORDER — THROMBIN (RECOMBINANT) 20000 UNITS EX SOLR
CUTANEOUS | Status: DC | PRN
  Administered 2017-05-15: 20 mL via TOPICAL

## 2017-05-15 MED ORDER — MENTHOL 3 MG MT LOZG: 1.0000 | LOZENGE | OROMUCOSAL | Status: DC | PRN

## 2017-05-15 MED ORDER — ACETAMINOPHEN 650 MG RE SUPP: 650.0000 mg | RECTAL | Status: DC | PRN

## 2017-05-15 MED ORDER — ACETAMINOPHEN 325 MG PO TABS
650.0000 mg | ORAL_TABLET | ORAL | Status: DC | PRN
  Administered 2017-05-16: 650 mg via ORAL
  Filled 2017-05-15: qty 2

## 2017-05-15 MED ORDER — LOSARTAN POTASSIUM 50 MG PO TABS
50.0000 mg | ORAL_TABLET | Freq: Every day | ORAL | Status: DC
  Administered 2017-05-16: 50 mg via ORAL
  Filled 2017-05-15: qty 1

## 2017-05-15 MED ORDER — BSS IO SOLN
15.0000 mL | Freq: Once | INTRAOCULAR | Status: AC
  Administered 2017-05-15: 15 mL
  Filled 2017-05-15: qty 15

## 2017-05-15 MED ORDER — ROCURONIUM BROMIDE 50 MG/5ML IV SOLN
INTRAVENOUS | Status: AC
  Filled 2017-05-15: qty 1

## 2017-05-15 MED ORDER — DIAZEPAM 5 MG PO TABS
5.0000 mg | ORAL_TABLET | Freq: Four times a day (QID) | ORAL | Status: DC | PRN
  Administered 2017-05-15 – 2017-05-16 (×2): 5 mg via ORAL
  Filled 2017-05-15 (×2): qty 1

## 2017-05-15 MED ORDER — EPHEDRINE 5 MG/ML INJ
INTRAVENOUS | Status: AC
  Filled 2017-05-15: qty 10

## 2017-05-15 MED ORDER — HYDROCODONE-ACETAMINOPHEN 7.5-325 MG PO TABS
2.0000 | ORAL_TABLET | ORAL | Status: DC | PRN
  Filled 2017-05-15: qty 2

## 2017-05-15 MED ORDER — HYDROMORPHONE HCL 1 MG/ML IJ SOLN
0.2500 mg | INTRAMUSCULAR | Status: DC | PRN
  Administered 2017-05-15 (×2): 0.5 mg via INTRAVENOUS

## 2017-05-15 MED ORDER — MIDAZOLAM HCL 2 MG/2ML IJ SOLN
INTRAMUSCULAR | Status: AC
  Filled 2017-05-15: qty 2

## 2017-05-15 MED ORDER — SCOPOLAMINE 1 MG/3DAYS TD PT72
1.0000 | MEDICATED_PATCH | TRANSDERMAL | Status: DC
  Administered 2017-05-15: 1.5 mg via TRANSDERMAL

## 2017-05-15 MED ORDER — SODIUM CHLORIDE 0.9% FLUSH: 3.0000 mL | INTRAVENOUS | Status: DC | PRN

## 2017-05-15 MED ORDER — ROCURONIUM BROMIDE 100 MG/10ML IV SOLN
INTRAVENOUS | Status: DC | PRN
  Administered 2017-05-15: 10 mg via INTRAVENOUS
  Administered 2017-05-15: 50 mg via INTRAVENOUS

## 2017-05-15 MED ORDER — PROPOFOL 10 MG/ML IV BOLUS
INTRAVENOUS | Status: AC
  Filled 2017-05-15: qty 20

## 2017-05-15 MED ORDER — PROMETHAZINE HCL 25 MG/ML IJ SOLN: 6.2500 mg | INTRAMUSCULAR | Status: DC | PRN

## 2017-05-15 MED ORDER — KETOROLAC TROMETHAMINE 0.5 % OP SOLN
1.0000 [drp] | Freq: Four times a day (QID) | OPHTHALMIC | Status: DC
  Administered 2017-05-15 – 2017-05-16 (×2): 1 [drp] via OPHTHALMIC
  Filled 2017-05-15: qty 3

## 2017-05-15 MED ORDER — FENTANYL CITRATE (PF) 100 MCG/2ML IJ SOLN
INTRAMUSCULAR | Status: DC | PRN
  Administered 2017-05-15 (×2): 50 ug via INTRAVENOUS
  Administered 2017-05-15: 100 ug via INTRAVENOUS
  Administered 2017-05-15: 50 ug via INTRAVENOUS

## 2017-05-15 MED ORDER — DIAZEPAM 5 MG PO TABS
ORAL_TABLET | ORAL | Status: AC
  Filled 2017-05-15: qty 1

## 2017-05-15 MED ORDER — ONDANSETRON HCL 4 MG/2ML IJ SOLN
INTRAMUSCULAR | Status: AC
  Filled 2017-05-15: qty 2

## 2017-05-15 MED ORDER — 0.9 % SODIUM CHLORIDE (POUR BTL) OPTIME
TOPICAL | Status: DC | PRN
  Administered 2017-05-15: 1000 mL

## 2017-05-15 MED ORDER — PHENYLEPHRINE HCL 10 MG/ML IJ SOLN
INTRAVENOUS | Status: DC | PRN
  Administered 2017-05-15: 40 ug/min via INTRAVENOUS

## 2017-05-15 MED ORDER — ONDANSETRON HCL 4 MG/2ML IJ SOLN
INTRAMUSCULAR | Status: DC | PRN
  Administered 2017-05-15: 4 mg via INTRAVENOUS

## 2017-05-15 MED ORDER — ONDANSETRON HCL 4 MG PO TABS: 4.0000 mg | ORAL_TABLET | Freq: Four times a day (QID) | ORAL | Status: DC | PRN

## 2017-05-15 MED ORDER — THROMBIN 20000 UNITS EX KIT
PACK | CUTANEOUS | Status: AC
  Filled 2017-05-15: qty 1

## 2017-05-15 MED ORDER — LOSARTAN POTASSIUM-HCTZ 50-12.5 MG PO TABS: 1.0000 | ORAL_TABLET | Freq: Every day | ORAL | Status: DC

## 2017-05-15 MED ORDER — SCOPOLAMINE 1 MG/3DAYS TD PT72
MEDICATED_PATCH | TRANSDERMAL | Status: AC
  Administered 2017-05-15: 1.5 mg via TRANSDERMAL
  Filled 2017-05-15: qty 1

## 2017-05-15 MED ORDER — SUGAMMADEX SODIUM 200 MG/2ML IV SOLN
INTRAVENOUS | Status: DC | PRN
  Administered 2017-05-15: 158.2 mg via INTRAVENOUS

## 2017-05-15 MED ORDER — POLYMYXIN B-TRIMETHOPRIM 10000-0.1 UNIT/ML-% OP SOLN
1.0000 [drp] | Freq: Three times a day (TID) | OPHTHALMIC | Status: DC
  Administered 2017-05-15 – 2017-05-16 (×2): 1 [drp] via OPHTHALMIC
  Filled 2017-05-15: qty 10

## 2017-05-15 MED ORDER — SODIUM CHLORIDE 0.9 % IV SOLN: 250.0000 mL | INTRAVENOUS | Status: DC

## 2017-05-15 MED ORDER — POTASSIUM CHLORIDE IN NACL 20-0.9 MEQ/L-% IV SOLN: INTRAVENOUS | Status: DC

## 2017-05-15 MED ORDER — HYDROCHLOROTHIAZIDE 12.5 MG PO CAPS
12.5000 mg | ORAL_CAPSULE | Freq: Every day | ORAL | Status: DC
  Administered 2017-05-16: 12.5 mg via ORAL
  Filled 2017-05-15: qty 1

## 2017-05-15 MED ORDER — MORPHINE SULFATE (PF) 4 MG/ML IV SOLN: 1.0000 mg | INTRAVENOUS | Status: DC | PRN

## 2017-05-15 MED ORDER — HYDROMORPHONE HCL 1 MG/ML IJ SOLN
INTRAMUSCULAR | Status: AC
  Filled 2017-05-15: qty 1

## 2017-05-15 MED ORDER — HYDROCODONE-ACETAMINOPHEN 5-325 MG PO TABS
1.0000 | ORAL_TABLET | ORAL | Status: DC | PRN
  Administered 2017-05-16: 1 via ORAL
  Filled 2017-05-15 (×2): qty 1

## 2017-05-15 MED ORDER — ONDANSETRON HCL 4 MG/2ML IJ SOLN
4.0000 mg | Freq: Four times a day (QID) | INTRAMUSCULAR | Status: DC | PRN
Start: 2017-05-15 — End: 2017-05-16

## 2017-05-15 SURGICAL SUPPLY — 56 items
BLADE CLIPPER SURG (BLADE) IMPLANT
BNDG GAUZE ELAST 4 BULKY (GAUZE/BANDAGES/DRESSINGS) IMPLANT
BUR DRUM 4.0 (BURR) ×3 IMPLANT
BUR MATCHSTICK NEURO 3.0 LAGG (BURR) ×3 IMPLANT
CANISTER SUCT 3000ML PPV (MISCELLANEOUS) ×3 IMPLANT
CARTRIDGE OIL MAESTRO DRILL (MISCELLANEOUS) ×2 IMPLANT
DECANTER SPIKE VIAL GLASS SM (MISCELLANEOUS) ×3 IMPLANT
DERMABOND ADVANCED (GAUZE/BANDAGES/DRESSINGS) ×1
DERMABOND ADVANCED .7 DNX12 (GAUZE/BANDAGES/DRESSINGS) ×2 IMPLANT
DIFFUSER DRILL AIR PNEUMATIC (MISCELLANEOUS) ×3 IMPLANT
DRAPE HALF SHEET 40X57 (DRAPES) ×3 IMPLANT
DRAPE LAPAROTOMY 100X72 PEDS (DRAPES) ×3 IMPLANT
DRAPE MICROSCOPE LEICA (MISCELLANEOUS) ×3 IMPLANT
DRAPE POUCH INSTRU U-SHP 10X18 (DRAPES) ×3 IMPLANT
DRSG OPSITE POSTOP 4X6 (GAUZE/BANDAGES/DRESSINGS) ×3 IMPLANT
DURAPREP 6ML APPLICATOR 50/CS (WOUND CARE) ×3 IMPLANT
ELECT COATED BLADE 2.86 ST (ELECTRODE) ×3 IMPLANT
ELECT REM PT RETURN 9FT ADLT (ELECTROSURGICAL) ×3
ELECTRODE REM PT RTRN 9FT ADLT (ELECTROSURGICAL) ×2 IMPLANT
GAUZE SPONGE 4X4 16PLY XRAY LF (GAUZE/BANDAGES/DRESSINGS) IMPLANT
GLOVE BIO SURGEON STRL SZ 6.5 (GLOVE) ×6 IMPLANT
GLOVE BIO SURGEON STRL SZ8 (GLOVE) ×3 IMPLANT
GLOVE BIOGEL PI IND STRL 6.5 (GLOVE) ×4 IMPLANT
GLOVE BIOGEL PI INDICATOR 6.5 (GLOVE) ×2
GLOVE ECLIPSE 6.5 STRL STRAW (GLOVE) ×6 IMPLANT
GLOVE EXAM NITRILE LRG STRL (GLOVE) IMPLANT
GLOVE EXAM NITRILE XL STR (GLOVE) IMPLANT
GLOVE EXAM NITRILE XS STR PU (GLOVE) IMPLANT
GLOVE SURG SS PI 6.5 STRL IVOR (GLOVE) ×9 IMPLANT
GOWN STRL REUS W/ TWL LRG LVL3 (GOWN DISPOSABLE) ×6 IMPLANT
GOWN STRL REUS W/ TWL XL LVL3 (GOWN DISPOSABLE) ×2 IMPLANT
GOWN STRL REUS W/TWL 2XL LVL3 (GOWN DISPOSABLE) IMPLANT
GOWN STRL REUS W/TWL LRG LVL3 (GOWN DISPOSABLE) ×3
GOWN STRL REUS W/TWL XL LVL3 (GOWN DISPOSABLE) ×1
HALTER HD/CHIN CERV TRACTION D (MISCELLANEOUS) IMPLANT
HEMOSTAT SURGICEL 2X14 (HEMOSTASIS) IMPLANT
KIT BASIN OR (CUSTOM PROCEDURE TRAY) ×3 IMPLANT
KIT ROOM TURNOVER OR (KITS) ×3 IMPLANT
NEEDLE HYPO 25X1 1.5 SAFETY (NEEDLE) ×3 IMPLANT
NEEDLE SPNL 22GX3.5 QUINCKE BK (NEEDLE) ×6 IMPLANT
NS IRRIG 1000ML POUR BTL (IV SOLUTION) ×3 IMPLANT
OIL CARTRIDGE MAESTRO DRILL (MISCELLANEOUS) ×3
PACK LAMINECTOMY NEURO (CUSTOM PROCEDURE TRAY) ×3 IMPLANT
PAD ARMBOARD 7.5X6 YLW CONV (MISCELLANEOUS) ×9 IMPLANT
PLATE HELIX T 36MM 2 LEVEL (Plate) ×3 IMPLANT
RUBBERBAND STERILE (MISCELLANEOUS) ×6 IMPLANT
SCREW SELF TAP 13MM VARIABLE (Screw) ×18 IMPLANT
SPACER PARALLEL 6MM CC ACF (Bone Implant) ×6 IMPLANT
SPONGE INTESTINAL PEANUT (DISPOSABLE) ×3 IMPLANT
SPONGE SURGIFOAM ABS GEL 100 (HEMOSTASIS) ×3 IMPLANT
SUT VIC AB 0 CT1 27 (SUTURE) ×1
SUT VIC AB 0 CT1 27XBRD ANTBC (SUTURE) ×2 IMPLANT
SUT VIC AB 3-0 SH 8-18 (SUTURE) ×6 IMPLANT
TOWEL GREEN STERILE (TOWEL DISPOSABLE) ×3 IMPLANT
TOWEL GREEN STERILE FF (TOWEL DISPOSABLE) ×3 IMPLANT
WATER STERILE IRR 1000ML POUR (IV SOLUTION) ×3 IMPLANT

## 2017-05-15 NOTE — Anesthesia Postprocedure Evaluation (Signed)
Anesthesia Post Note  Patient: Laura [name]  Procedure(s) Performed: ANTERIOR CERVICAL DECOMPRESSION/DISCECTOMY FUSION CERVICL THREE CERVICAL FOUR, CERVICAL FOUR- CERVICAL FIVE (N/A Spine Cervical)     Patient location during evaluation: PACU Anesthesia Type: General Level of consciousness: awake and alert Pain management: pain level controlled Vital Signs Assessment: post-procedure vital signs reviewed and stable Respiratory status: spontaneous breathing, nonlabored ventilation, respiratory function stable and patient connected to nasal cannula oxygen Cardiovascular status: blood pressure returned to baseline and stable Postop Assessment: no apparent nausea or vomiting Anesthetic complications: no    Last Vitals:  Vitals:   05/15/17 1730 05/15/17 1745  BP: (!) 145/82 104/86  Pulse: 97 86  Resp: 20 14  Temp: (!) 36.4 C   SpO2: 94% 99%    Last Pain:  Vitals:   05/15/17 1745  PainSc: 0-No pain                 Suman Trivedi S

## 2017-05-15 NOTE — Anesthesia Procedure Notes (Signed)
Procedure Name: Intubation Date/Time: 05/15/2017 2:55 PM Performed by: Carney Living Pre-anesthesia Checklist: Patient identified, Emergency Drugs available, Suction available, Patient being monitored and Timeout performed Patient Re-evaluated:Patient Re-evaluated prior to induction Oxygen Delivery Method: Circle system utilized Preoxygenation: Pre-oxygenation with 100% oxygen Induction Type: IV induction Ventilation: Mask ventilation without difficulty Laryngoscope Size: Mac and 4 Grade View: Grade I Tube type: Oral Tube size: 7.0 mm Number of attempts: 1 Airway Equipment and Method: Stylet Placement Confirmation: ETT inserted through vocal cords under direct vision,  positive ETCO2 and breath sounds checked- equal and bilateral Secured at: 20 cm Tube secured with: Tape Dental Injury: Teeth and Oropharynx as per pre-operative assessment  Comments: Very minimal neck extension with DL and ETT placement

## 2017-05-15 NOTE — Anesthesia Preprocedure Evaluation (Addendum)
Anesthesia Evaluation  Patient identified by MRN, date of birth, ID band Patient awake    Reviewed: Allergy & Precautions, NPO status , Patient's Chart, lab work & pertinent test results  History of Anesthesia Complications (+) PONV  Airway Mallampati: II  TM Distance: >3 FB Neck ROM: Limited   Comment: Good flexion and extension. Lateral movement restricted Dental no notable dental hx. (+) Teeth Intact, Dental Advisory Given   Pulmonary neg pulmonary ROS, former smoker,    Pulmonary exam normal breath sounds clear to auscultation       Cardiovascular hypertension, Pt. on medications Normal cardiovascular exam Rhythm:Regular Rate:Normal  - Left ventricle: The cavity size was normal. Wall thickness was   normal. Systolic function was normal. The estimated ejection   fraction was in the range of 60% to 65%. Wall motion was normal;   there were no regional wall motion abnormalities. Doppler   parameters are consistent with abnormal left ventricular   relaxation (grade 1 diastolic dysfunction). The E/e&' ratio is <8,   suggesting normal LV filling pressure. - Left atrium: The atrium was normal in size. - Tricuspid valve: There was trivial regurgitation. - Pulmonary arteries: PA peak pressure: 35 mm Hg (S). - Inferior vena cava: The vessel was dilated. The respirophasic   diameter changes were blunted (< 50%), consistent with elevated   central venous pressure.  Impressions:  - LVEF 60-65%, normal wall thickness, normal wall motion, grade 1   DD, normal LV filling pressure, normal LA size, trivial TR, RVSP   35 mmHg, dilated IVC.    Neuro/Psych negative neurological ROS  negative psych ROS   GI/Hepatic negative GI ROS, Neg liver ROS, GERD  Medicated and Controlled,  Endo/Other  negative endocrine ROS  Renal/GU Renal InsufficiencyRenal disease  negative genitourinary   Musculoskeletal negative musculoskeletal  ROS (+) Arthritis , Osteoarthritis,    Abdominal   Peds negative pediatric ROS (+)  Hematology negative hematology ROS (+)   Anesthesia Other Findings   Reproductive/Obstetrics negative OB ROS                           Anesthesia Physical Anesthesia Plan  ASA: II  Anesthesia Plan: General   Post-op Pain Management:    Induction: Intravenous  PONV Risk Score and Plan: 1 and Ondansetron, Dexamethasone and Treatment may vary due to age or medical condition  Airway Management Planned: Oral ETT  Additional Equipment:   Intra-op Plan:   Post-operative Plan: Extubation in OR  Informed Consent: I have reviewed the patients History and Physical, chart, labs and discussed the procedure including the risks, benefits and alternatives for the proposed anesthesia with the patient or authorized representative who has indicated his/her understanding and acceptance.   Dental advisory given  Plan Discussed with: CRNA, Surgeon and Anesthesiologist  Anesthesia Plan Comments:        Anesthesia Quick Evaluation

## 2017-05-15 NOTE — H&P (Signed)
BP (!) 177/80   Pulse 82   Temp 98 F (36.7 C)   Resp 20   Ht 5\' 4"  (1.626 m)   Wt 79.1 kg (174 lb 5 oz)   SpO2 100%   BMI 29.92 kg/m  [name] is a 76 year old woman who presents today for evaluation of pain which she has in her neck. She has had this pain now for since essentially mid February. She did not have any antecedent trauma. She has never really had pain like this before. It does not involve the upper extremities at all. Most of the pain is isolated to the left side of her neck.  PAST MEDICAL HISTORY: Past medical history is significant for hypertension, basal cell carcinoma on the back and nose, cataracts, Epstein-Barr viral infection, hyperlipidemia, fibrocystic breast disease, has had a near syncopal episode, and gastroesophageal reflux.  PAST SURGICAL HISTORY: Past surgical history includes a vaginal hysterectomy, cataract surgery, cataract extraction, abdominal hysterectomy, colonoscopy, and polypectomy.  DATA: MRI of the cervical spine was reviewed. What it shows is significant spondylytic change present at C3-4, 4-5, 5-6, 6-7, and a large facet at C3-4 eccentric to the right side. Some foraminal narrowing present at 5-6 bilaterally. Canal narrowing, but no abnormal cord signal and spinal fluid can be seen throughout the cervical spine. Paraspinous soft tissue is otherwise normal.  Plain x-rays of the cervical spine show significant uncovertebral spondylytic change present at C3-4 on the right side, with severe foraminal narrowing. The left side actually looks good. Alignment she has slight anterior listhesis of C7 on T1. INTERVAL PFSH: Family history significant for heart disease, dementia, hyperlipidemia, stomach cancer, myocardial infarction, colon cancer, pulmonary embolism, and rectal cancer. She is married, with children. 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 She does have a 16-year pack year history of smoking. She does not drink regularly, but just on occasion  will have a glass of wine. She has no illicit drug use.  MEDICATIONS: Vitamin D, Cleocin, Losartan, and a Multivitamin.  ALLERGIES: SHE HAS AN INTOLERANCE TO LISINOPRIL, IT MAKES HER DIZZY. STATINS CAUSE MUSCLE SORENESS. SHE FEELS FEVERS WITH SULFA ANTIBIOTICS. TETRACYCLINE CAUSES GI UPSET.  REVIEW OF SYSTEMS: Review of systems positive for back pain. She denies constitutional, eye, ear, nose, throat, mouth, cardiovascular, respiratory, gastrointestinal, genitourinary, skin, neurological, psychiatric, endocrine, or allergic problems.  PHYSICAL EXAMINATION: She has had full strength. She has had no bowel or bladder dysfunction which she attributes to this. Vital signs are as follows: Height 65 inches, weight 167 pounds, BMI is 27.29, blood pressure is 138/74, pulse 76, respiratory rate 12, temperature is 98.5 degrees Fahrenheit. On exam, she is alert and oriented x4 and answering all questions appropriately. Memory, language, attention span, and fund of knowledge are normal. Pupils equal, round, and reactive to light. Full extraocular movements. Full visual fields. Hearing intact to voice bilaterally. Uvula elevates in the midline. Shoulder shrug is normal. Tongue protrudes in the midline. She has 5/5 strength in both upper and lower extremities. Normal muscle tone, bulk, and coordination. Romberg test is negative. Reflexes are 2+ in the upper and lower extremities. No conus. No Hoffman's sign. Toes are downgoing on plantar stimulation. Proprioception is intact. Gait is normal.  Laura [name] comes in today. We went over studies and films for well over 30 minutes. She would like to proceed with an ACDF from C3-C6. While she has arthritic changes present at 6-7 and a listhesis at 7-1, I do not think doing a fusion leaving  only the 2-3 disc space at this point is useful. She does not have any compression at 6-7 or at 7-1. She does have foraminal narrowing at 3-4, 4-5, 5-6. Risks and benefits were explained. She  understands and wishes to proceed.

## 2017-05-15 NOTE — Addendum Note (Signed)
Addendum  created 05/15/17 2023 by Lillia Abed, MD   Order list changed, Order sets accessed

## 2017-05-15 NOTE — Op Note (Signed)
05/15/2017  5:22 PM  PATIENT:  Laura [name]  76 y.o. 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 Stofer was taken to the operating room, intubated, and placed under general anesthesia without difficulty. She was positioned supine with her head in slight extension on a horseshoe headrest. The neck was prepped and draped in a sterile manner. I opened the incision with a 10 blade and dissected sharply through soft tissue to the platysma. I dissected in the plane superior to the platysma both rostrally and caudally. I then opened the platysma in a horizontal fashion with Metzenbaum scissors, and dissected in the inferior plane rostrally and caudally. With both blunt and sharp technique I created an avascular corridor to the cervical spine. I placed a spinal needle(s) in the disc space at C2/3 . I then reflected the longus colli from C3 to C5 and placed self retaining retractors. I opened the disc space(s) at 3/4, and 4/5 with a 15 blade. I removed disc with curettes, Kerrison punches, and the drill. Using the drill I removed osteophytes and prepared for the decompression.  I decompressed the spinal canal and the C4, and 5 root(s) with the drill, Kerrison punches, and the curettes. I used the microscope to aid in microdissection. I removed the posterior longitudinal ligament to fully expose and decompress the thecal sac. I exposed  the roots laterally taking down the C3/4, and 4/5 uncovertebral joints. With the decompression complete I moved on to the arthrodesis. I used the drill to level the surfaces of C3,4,5. I removed soft tissue to prepare the disc space and the bony surfaces. I measured the space and placed a 44mm structural allograft into each disc space.  We then placed the anterior instrumentation. I placed 2 screws in each vertebral body through the plate. I locked the screws into place. Intraoperative xray showed the graft, plate, and screws to be in good position. I irrigated the wound, achieved hemostasis, and closed the wound in layers. I approximated the platysma, and the subcuticular plane with vicryl sutures. I used Dermabond for a sterile dressing.   PLAN OF CARE: Admit for overnight observation  PATIENT DISPOSITION:  PACU - hemodynamically stable.   Delay start of Pharmacological VTE agent (>24hrs) due to surgical blood loss or risk of bleeding:  yes

## 2017-05-15 NOTE — Transfer of Care (Signed)
Immediate Anesthesia Transfer of Care Note  Patient: Laura [name]  Procedure(s) Performed: ANTERIOR CERVICAL DECOMPRESSION/DISCECTOMY FUSION CERVICL THREE CERVICAL FOUR, CERVICAL FOUR- CERVICAL FIVE (N/A Spine Cervical)  Patient Location: PACU  Anesthesia Type:General  Level of Consciousness: awake and alert   Airway & Oxygen Therapy: Patient Spontanous Breathing and Patient connected to nasal cannula oxygen  Post-op Assessment: Report given to RN and Post -op Vital signs reviewed and stable  Post vital signs: Reviewed and stable  Last Vitals:  Vitals:   05/15/17 1016  BP: (!) 177/80  Pulse: 82  Resp: 20  Temp: 36.7 C  SpO2: 100%    Last Pain:  Vitals:   05/15/17 1034  PainSc: 6       Patients Stated Pain Goal: 3 (36/01/65 8006)  Complications: No apparent anesthesia complications

## 2017-05-15 NOTE — Progress Notes (Signed)
Pt c/o right eye watering, feels scratchy; it looks a bit red. Dr Conrad Amity updated-will put orders in  Per protocol.

## 2017-05-16 DIAGNOSIS — M47812 Spondylosis without myelopathy or radiculopathy, cervical region: Secondary | ICD-10-CM | POA: Diagnosis not present

## 2017-05-16 DIAGNOSIS — K219 Gastro-esophageal reflux disease without esophagitis: Secondary | ICD-10-CM | POA: Diagnosis present

## 2017-05-16 DIAGNOSIS — Z90710 Acquired absence of both cervix and uterus: Secondary | ICD-10-CM | POA: Diagnosis not present

## 2017-05-16 DIAGNOSIS — M4722 Other spondylosis with radiculopathy, cervical region: Secondary | ICD-10-CM | POA: Diagnosis present

## 2017-05-16 DIAGNOSIS — Z87891 Personal history of nicotine dependence: Secondary | ICD-10-CM | POA: Diagnosis not present

## 2017-05-16 DIAGNOSIS — E785 Hyperlipidemia, unspecified: Secondary | ICD-10-CM | POA: Diagnosis present

## 2017-05-16 DIAGNOSIS — I10 Essential (primary) hypertension: Secondary | ICD-10-CM | POA: Diagnosis present

## 2017-05-16 DIAGNOSIS — Z85828 Personal history of other malignant neoplasm of skin: Secondary | ICD-10-CM | POA: Diagnosis not present

## 2017-05-16 MED ORDER — TIZANIDINE HCL 4 MG PO TABS: 4.0000 mg | ORAL_TABLET | Freq: Four times a day (QID) | ORAL | 0 refills | Status: DC | PRN

## 2017-05-16 MED ORDER — ACETAMINOPHEN-CODEINE #3 300-30 MG PO TABS: 1.0000 | ORAL_TABLET | Freq: Four times a day (QID) | ORAL | 0 refills | Status: DC | PRN

## 2017-05-16 NOTE — Evaluation (Signed)
Occupational Therapy Evaluation and Discharge Patient Details Name: Laura [name] MRN: [id number] DOB: 17-Apr-1941 Today's Date: 05/16/2017    History of Present Illness Patient is a 76 yo female with Past medical history is significant for hypertension, basal cell carcinoma on the back and nose, cataracts, Epstein-Barr viral infection, hyperlipidemia, fibrocystic breast disease, has had a near syncopal episode, and gastroesophageal reflux who presents s/p ACDF C3-6.   Clinical Impression   PTA Pt independent in ADL and mobility. Pt is currently supervision for ADL and mobility with no DME. Cervical handout provided and reviewed adls in detail including compensatory strategies. Pt educated on: avoid sitting for long periods of time, correct bed positioning for sleeping, correct sequence for bed mobility,   All education is complete and patient indicates understanding. Pt had no questions at the end of session. OT to sign off at this time thank you for the opportunity to serve this patient.      Follow Up Recommendations  No OT follow up;Supervision - Intermittent    Equipment Recommendations  None recommended by OT    Recommendations for Other Services       Precautions / Restrictions Precautions Precautions: Cervical Precaution Comments: verbally reviewed with patient, ahndout provided Required Braces or Orthoses:  (none)      Mobility Bed Mobility               General bed mobility comments: Pt sitting EOB when OT entered, verbally reviewed with Pt  Transfers Overall transfer level: Needs assistance Equipment used: None Transfers: Sit to/from Stand Sit to Stand: Supervision         General transfer comment: supervision for safety, no physical assist requried    Balance Overall balance assessment: Needs assistance Sitting-balance support: Feet supported Sitting balance-Leahy Scale: Good Sitting balance - Comments: able to don/doff socks sitting EOB    Standing balance support: No upper extremity supported Standing balance-Leahy Scale: Fair                             ADL either performed or assessed with clinical judgement   ADL Overall ADL's : Needs assistance/impaired Eating/Feeding: Modified independent   Grooming: Wash/dry hands;Oral care;Min guard;Cueing for compensatory techniques Grooming Details (indicate cue type and reason): mod verbal cues for cervical biomechanics during grooming Upper Body Bathing: Supervision/ safety   Lower Body Bathing: Supervison/ safety   Upper Body Dressing : Supervision/safety   Lower Body Dressing: Supervision/safety   Toilet Transfer: Min guard;Ambulation;Grab bars   Toileting- Clothing Manipulation and Hygiene: Supervision/safety   Tub/ Banker: Min guard   Functional mobility during ADLs: Min guard General ADL Comments: Pt requires mod vc throughout session for precautions     Vision Patient Visual Report: No change from baseline Vision Assessment?: No apparent visual deficits     Perception     Praxis      Pertinent Vitals/Pain Pain Assessment: Faces Faces Pain Scale: Hurts even more Pain Location: neck, shoulders and upper arms Pain Descriptors / Indicators: Discomfort;Grimacing;Aching Pain Intervention(s): Monitored during session     Hand Dominance Right   Extremity/Trunk Assessment Upper Extremity Assessment Upper Extremity Assessment: Overall WFL for tasks assessed   Lower Extremity Assessment Lower Extremity Assessment: Defer to PT evaluation   Cervical / Trunk Assessment Cervical / Trunk Assessment:  (s/p cervical spine surgery)   Communication Communication Communication: No difficulties   Cognition Arousal/Alertness: Awake/alert Behavior During Therapy: WFL for tasks assessed/performed Overall Cognitive Status: Within  Functional Limits for tasks assessed                                     General Comments        Exercises     Shoulder Instructions      Home Living Family/patient expects to be discharged to:: Private residence Living Arrangements: Spouse/significant other Available Help at Discharge: Family Type of Home: House Home Access: Stairs to enter [employer] of Steps: 1 (threshold)   Home Layout: One level     Bathroom Shower/Tub: Teacher, early years/pre: Standard     Home Equipment: None          Prior Functioning/Environment Level of Independence: Independent                 OT Problem List: Decreased activity tolerance;Impaired balance (sitting and/or standing);Decreased safety awareness;Pain;Decreased knowledge of precautions      OT Treatment/Interventions:      OT Goals(Current goals can be found in the care plan section) Acute Rehab OT Goals Patient Stated Goal: to get home and shower OT Goal Formulation: With patient Time For Goal Achievement: 05/28/17 Potential to Achieve Goals: Good  OT Frequency:     Barriers to D/C:            Co-evaluation              AM-PAC PT "6 Clicks" Daily Activity     Outcome Measure Help from another person eating meals?: None Help from another person taking care of personal grooming?: None Help from another person toileting, which includes using toliet, bedpan, or urinal?: None Help from another person bathing (including washing, rinsing, drying)?: A Little Help from another person to put on and taking off regular upper body clothing?: None Help from another person to put on and taking off regular lower body clothing?: A Little 6 Click Score: 22   End of Session Nurse Communication: Mobility status  Activity Tolerance: Patient tolerated treatment well Patient left: in bed;with call bell/phone within reach  OT Visit Diagnosis: Unsteadiness on feet (R26.81);Pain Pain - part of body:  (neck)                Time: 6237-6283 OT Time Calculation (min): 16 min Charges:  OT General  Charges $OT Visit: 1 Visit OT Evaluation $OT Eval Low Complexity: 1 Low G-Codes:     Hulda Humphrey OTR/L [place] 05/16/2017, 1:25 PM

## 2017-05-16 NOTE — Discharge Summary (Signed)
Physician Discharge Summary  Patient ID: Nohemi Nicklaus MRN: [id number] DOB/AGE: [birth year] 76 y.o.  Admit date: 05/15/2017 Discharge date: 05/16/2017  Admission Diagnoses:spondylosis cervical  C3/4,4/5  Discharge Diagnoses: same Active Problems:   Spondylosis of cervical joint   Discharged Condition: good  Hospital Course: Mrs. Losano was admitted and taken to the operating room for an uncomplicated ACDF at J0/3,0/0. Post op she is ambulating, voiding, and tolerating a regular diet. Her wound is clean, dry, and without signs of infection. She is speaking well.   Treatments: surgery: PROCEDURE:  Anterior Cervical decompression C3/4,4/5 Arthrodesis C3-5 with 59mm structural allografts x 2 Anterior instrumentation(Nuvasive translational helix plate) C3-5     Discharge Exam: Blood pressure (!) 162/71, pulse 67, temperature 97.9 F (36.6 C), temperature source Oral, resp. rate 18, height 5\' 4"  (1.626 m), weight 79.1 kg (174 lb 5 oz), SpO2 100 %. General appearance: alert, cooperative, appears stated age and mild distress Neurologic: Alert and oriented X 3, normal strength and tone. Normal symmetric reflexes. Normal coordination and gait  Disposition: 01-Home or Self Care CERVICAL SPONDYLOSIS WITH RADICULOPATHY  Allergies as of 05/16/2017      Reactions   Lisinopril Other (See Comments)   dizzy   Statins Other (See Comments)   Muscle weakness   Sulfa Antibiotics Other (See Comments)   "feels feverish"   Tetracyclines & Related Other (See Comments)   GI upset      Medication List    TAKE these medications   acetaminophen 500 MG tablet Commonly known as:  TYLENOL Take 500 mg by mouth 2 (two) times daily as needed for mild pain.   acetaminophen-codeine 300-30 MG tablet Commonly known as:  TYLENOL #3 Take 1 tablet by mouth every 6 (six) hours as needed for moderate pain.   cholecalciferol 1000 units tablet Commonly known as:  VITAMIN D Take 1,000 Units by mouth  daily.   colchicine 0.6 MG tablet 2 tabs by mouth now, followed by 1 tab one hour later.   losartan-hydrochlorothiazide 50-12.5 MG tablet Commonly known as:  HYZAAR TAKE 1 TABLET BY MOUTH DAILY.   naproxen 500 MG tablet Commonly known as:  NAPROSYN Take 500 mg by mouth at bedtime.   tiZANidine 4 MG tablet Commonly known as:  ZANAFLEX Take 1 tablet (4 mg total) by mouth every 6 (six) hours as needed for muscle spasms.      Follow-up Information    Ashok Pall, MD Follow up in 3 week(s).   Specialty:  Neurosurgery Why:  please call the office to make an appointment Contact information: [address] [place] [address] [phone]           Signed: Winfield Cunas 05/16/2017, 11:52 AM

## 2017-05-16 NOTE — Progress Notes (Signed)
Patient is discharged from room 3C07 at this time. Alert and in stable condition. IV site d/c'd and instructions read to patient and spouse with understanding verbalized. Left unit via wheelchair with all belongings at side. 

## 2017-05-16 NOTE — Discharge Instructions (Signed)

## 2017-05-16 NOTE — Evaluation (Signed)
Physical Therapy Evaluation Patient Details Name: Laura [name] MRN: [id number] DOB: [birth year] Today's Date: 05/16/2017   History of Present Illness  Patient is a 76 yo female with Past medical history is significant for hypertension, basal cell carcinoma on the back and nose, cataracts, Epstein-Barr viral infection, hyperlipidemia, fibrocystic breast disease, has had a near syncopal episode, and gastroesophageal reflux who presents s/p ACDF C3-6.  Clinical Impression  Patient seen for mobility assessment s/p cervical spine surgery. Educated patient on precautions, mobility expectations, safety and car transfers. No further acute PT needs. Will sign off.     Follow Up Recommendations No PT follow up    Equipment Recommendations  None recommended by PT    Recommendations for Other Services       Precautions / Restrictions Precautions Precautions: Cervical Precaution Comments: verbally reviewed with patient      Mobility  Bed Mobility Overal bed mobility: Needs Assistance Bed Mobility: Rolling;Sidelying to Sit;Sit to Sidelying Rolling: Supervision Sidelying to sit: Supervision     Sit to sidelying: Supervision General bed mobility comments: Vcs for technique  Transfers Overall transfer level: Needs assistance Equipment used: None Transfers: Sit to/from Stand Sit to Stand: Supervision         General transfer comment: supervision for safety, no physical assist requried  Ambulation/Gait Ambulation/Gait assistance: Min guard Ambulation Distance (Feet): 70 Feet Assistive device: None Gait Pattern/deviations: Step-through pattern;Decreased stride length;Drifts right/left Gait velocity: decreased   General Gait Details: modest instability and one LOB but able to self correct (reports due to "woozy feeling")  Stairs Stairs: Yes Stairs assistance: Supervision Stair Management: One rail Right Number of Stairs: 2 General stair comments: no difficulty, supervision  for safety  Wheelchair Mobility    Modified Rankin (Stroke Patients Only)       Balance Overall balance assessment: Needs assistance Sitting-balance support: Feet supported Sitting balance-Leahy Scale: Good     Standing balance support: No upper extremity supported Standing balance-Leahy Scale: Fair                               Pertinent Vitals/Pain Pain Assessment: Faces Faces Pain Scale: Hurts even more Pain Location: neck, shoulders and upper arms Pain Descriptors / Indicators: Discomfort;Grimacing;Aching Pain Intervention(s): Monitored during session    Home Living Family/patient expects to be discharged to:: Private residence Living Arrangements: Spouse/significant other Available Help at Discharge: Family Type of Home: House Home Access: Stairs to enter   Technical brewer of Steps: 1 (threshold) Home Layout: One level Home Equipment: None      Prior Function Level of Independence: Independent               Hand Dominance   Dominant Hand: Right    Extremity/Trunk Assessment   Upper Extremity Assessment Upper Extremity Assessment: Defer to OT evaluation    Lower Extremity Assessment Lower Extremity Assessment: Overall WFL for tasks assessed    Cervical / Trunk Assessment Cervical / Trunk Assessment:  (s/p cervical spine surgery)  Communication   Communication: No difficulties  Cognition Arousal/Alertness: Awake/alert Behavior During Therapy: WFL for tasks assessed/performed Overall Cognitive Status: Within Functional Limits for tasks assessed                                        General Comments      Exercises     Assessment/Plan  PT Assessment Patent does not need any further PT services (defer further needs to OT therapy)  PT Problem List         PT Treatment Interventions      PT Goals (Current goals can be found in the Care Plan section)  Acute Rehab PT Goals PT Goal Formulation:  All assessment and education complete, DC therapy    Frequency     Barriers to discharge        Co-evaluation               AM-PAC PT "6 Clicks" Daily Activity  Outcome Measure Difficulty turning over in bed (including adjusting bedclothes, sheets and blankets)?: A Little Difficulty moving from lying on back to sitting on the side of the bed? : A Little Difficulty sitting down on and standing up from a chair with arms (e.g., wheelchair, bedside commode, etc,.)?: A Little Help needed moving to and from a bed to chair (including a wheelchair)?: A Little Help needed walking in hospital room?: A Little Help needed climbing 3-5 steps with a railing? : A Little 6 Click Score: 18    End of Session Equipment Utilized During Treatment: Gait belt Activity Tolerance: Other (comment) (feeling woozy) Patient left: in bed;with call bell/phone within reach Nurse Communication: Mobility status PT Visit Diagnosis: Other symptoms and signs involving the nervous system (R29.898)    Time: 6269-4854 PT Time Calculation (min) (ACUTE ONLY): 18 min   Charges:   PT Evaluation $PT Eval Low Complexity: 1 Low     PT G Codes:   PT G-Codes **NOT FOR INPATIENT CLASS** Functional Assessment Tool Used: Clinical judgement Functional Limitation: Mobility: Walking and moving around Mobility: Walking and Moving Around Current Status (O2703): At least 1 percent but less than 20 percent impaired, limited or restricted Mobility: Walking and Moving Around Goal Status [phone]): At least 1 percent but less than 20 percent impaired, limited or restricted Mobility: Walking and Moving Around Discharge Status [phone]): At least 1 percent but less than 20 percent impaired, limited or restricted    Alben Deeds, PT DPT  Board Certified Neurologic Specialist [place] 05/16/2017, 9:14 AM

## 2017-05-17 ENCOUNTER — Other Ambulatory Visit: Payer: Self-pay | Admitting: Family Medicine

## 2017-05-19 ENCOUNTER — Other Ambulatory Visit: Payer: Self-pay | Admitting: Neurosurgery

## 2017-05-19 DIAGNOSIS — M4722 Other spondylosis with radiculopathy, cervical region: Secondary | ICD-10-CM

## 2017-05-20 ENCOUNTER — Encounter (HOSPITAL_COMMUNITY): Payer: Self-pay | Admitting: Neurosurgery

## 2017-05-22 ENCOUNTER — Other Ambulatory Visit: Payer: Self-pay | Admitting: Family Medicine

## 2017-05-27 ENCOUNTER — Encounter (HOSPITAL_COMMUNITY): Payer: Self-pay | Admitting: Neurosurgery

## 2017-06-03 ENCOUNTER — Ambulatory Visit (INDEPENDENT_AMBULATORY_CARE_PROVIDER_SITE_OTHER): Payer: Medicare Other

## 2017-06-03 DIAGNOSIS — M4722 Other spondylosis with radiculopathy, cervical region: Secondary | ICD-10-CM

## 2017-06-03 DIAGNOSIS — M50322 Other cervical disc degeneration at C5-C6 level: Secondary | ICD-10-CM | POA: Diagnosis not present

## 2017-06-03 DIAGNOSIS — M50323 Other cervical disc degeneration at C6-C7 level: Secondary | ICD-10-CM | POA: Diagnosis not present

## 2017-07-01 DIAGNOSIS — M542 Cervicalgia: Secondary | ICD-10-CM | POA: Diagnosis not present

## 2017-07-01 DIAGNOSIS — M47812 Spondylosis without myelopathy or radiculopathy, cervical region: Secondary | ICD-10-CM | POA: Diagnosis not present

## 2017-07-31 ENCOUNTER — Encounter: Payer: Self-pay | Admitting: Family Medicine

## 2017-07-31 ENCOUNTER — Ambulatory Visit (HOSPITAL_BASED_OUTPATIENT_CLINIC_OR_DEPARTMENT_OTHER): Payer: Medicare Other

## 2017-08-07 ENCOUNTER — Ambulatory Visit (INDEPENDENT_AMBULATORY_CARE_PROVIDER_SITE_OTHER): Payer: Medicare Other | Admitting: Family Medicine

## 2017-08-07 ENCOUNTER — Ambulatory Visit (HOSPITAL_BASED_OUTPATIENT_CLINIC_OR_DEPARTMENT_OTHER)
Admission: RE | Admit: 2017-08-07 | Discharge: 2017-08-07 | Disposition: A | Discharge status: Home / Self Care | Payer: Medicare Other | Source: Ambulatory Visit | Attending: Family Medicine | Admitting: Family Medicine

## 2017-08-07 DIAGNOSIS — M542 Cervicalgia: Secondary | ICD-10-CM | POA: Diagnosis not present

## 2017-08-07 DIAGNOSIS — M5481 Occipital neuralgia: Secondary | ICD-10-CM

## 2017-08-07 DIAGNOSIS — Z1231 Encounter for screening mammogram for malignant neoplasm of breast: Secondary | ICD-10-CM | POA: Diagnosis not present

## 2017-08-07 DIAGNOSIS — E785 Hyperlipidemia, unspecified: Secondary | ICD-10-CM | POA: Diagnosis not present

## 2017-08-07 DIAGNOSIS — N183 Chronic kidney disease, stage 3 unspecified: Secondary | ICD-10-CM

## 2017-08-07 DIAGNOSIS — K5900 Constipation, unspecified: Secondary | ICD-10-CM | POA: Diagnosis not present

## 2017-08-07 DIAGNOSIS — E669 Obesity, unspecified: Secondary | ICD-10-CM | POA: Diagnosis not present

## 2017-08-07 DIAGNOSIS — I10 Essential (primary) hypertension: Secondary | ICD-10-CM | POA: Diagnosis not present

## 2017-08-07 LAB — COMPREHENSIVE METABOLIC PANEL
ALT: 11 U/L (ref 0–35)
AST: 14 U/L (ref 0–37)
Albumin: 4.3 g/dL (ref 3.5–5.2)
Alkaline Phosphatase: 52 U/L (ref 39–117)
BUN: 27 mg/dL — ABNORMAL HIGH (ref 6–23)
CO2: 30 mEq/L (ref 19–32)
Calcium: 9.7 mg/dL (ref 8.4–10.5)
Chloride: 102 mEq/L (ref 96–112)
Creatinine, Ser: 0.9 mg/dL (ref 0.40–1.20)
GFR: 64.84 mL/min (ref >60.00)
Glucose, Bld: 99 mg/dL (ref 70–99)
Potassium: 4.1 mEq/L (ref 3.5–5.1)
Sodium: 138 mEq/L (ref 135–145)
Total Bilirubin: 0.3 mg/dL (ref 0.2–1.2)
Total Protein: 7 g/dL (ref 6.0–8.3)

## 2017-08-07 LAB — CBC
HCT: 42.4 % (ref 36.0–46.0)
Hemoglobin: 13.8 g/dL (ref 12.0–15.0)
MCHC: 32.5 g/dL (ref 30.0–36.0)
MCV: 90.8 fl (ref 78.0–100.0)
Platelets: 285 10*3/uL (ref 150.0–400.0)
RBC: 4.67 Mil/uL (ref 3.87–5.11)
RDW: 13.7 % (ref 11.5–15.5)
WBC: 5.8 10*3/uL (ref 4.0–10.5)

## 2017-08-07 LAB — TSH: TSH: 2.78 u[IU]/mL (ref 0.35–4.50)

## 2017-08-07 LAB — LIPID PANEL
Cholesterol: 251 mg/dL — ABNORMAL HIGH (ref 0–200)
HDL: 49.8 mg/dL (ref >39.00)
LDL Cholesterol: 171 mg/dL — ABNORMAL HIGH (ref 0–99)
NonHDL: 201.25
Total CHOL/HDL Ratio: 5
Triglycerides: 153 mg/dL — ABNORMAL HIGH (ref 0.0–149.0)
VLDL: 30.6 mg/dL (ref 0.0–40.0)

## 2017-08-07 MED ORDER — LOSARTAN POTASSIUM-HCTZ 50-12.5 MG PO TABS: 1.0000 | ORAL_TABLET | Freq: Every day | ORAL | 1 refills | Status: DC

## 2017-08-07 NOTE — Assessment & Plan Note (Signed)
Encouraged heart healthy diet, increase exercise, avoid trans fats, consider a krill oil cap daily 

## 2017-08-07 NOTE — Assessment & Plan Note (Signed)
Well controlled, no changes to meds. Encouraged heart healthy diet such as the DASH diet and exercise as tolerated.  °

## 2017-08-07 NOTE — Assessment & Plan Note (Signed)
ncrease exercise as tolerated. Needs 7-8 hours of sleep nightly. Avoid trans fats, eat small, frequent meals every 4-5 hours with lean proteins, complex carbs and healthy fats. Minimize simple carbs

## 2017-08-07 NOTE — Patient Instructions (Addendum)
64 oz of fluids, caffeine and alcohol work against the total Consider taking mucinex plain twice a day for a month or two  Consider mixing Miralax and benefibet together once or twice a day  Shingrix is the new shingles shot, 2 shots over to 2-6 months at pharmacy  Encouraged increased hydration and fiber in diet. Daily probiotics. If bowels not moving can use MOM 2 tbls po in 4 oz of warm prune juice by mouth every 2-3 days. If no results then repeat in 4 hours with  Dulcolax suppository pr, may repeat again in 4 more hours as needed. Seek care if symptoms worsen. Consider daily Miralax and/or Dulcolax if symptoms persist.  Dehydration Dehydration is when there is not enough fluid or water in your body. This happens when you lose more fluids than you take in. People who are age 76 or older have a higher risk of getting dehydrated. Dehydration can range from mild to very bad. It should be treated right away to keep it from getting very bad. Symptoms of mild dehydration may include:  Thirst.  Dry lips.  Slightly dry mouth.  Dry, warm skin.  Dizziness. Symptoms of moderate dehydration may include:  Very dry mouth.  Muscle cramps.  Dark pee (urine). Pee may be the color of tea.  Your body making less pee.  Your eyes making fewer tears.  Heartbeat that is uneven or faster than normal (palpitations).  Headache.  Light-headedness, especially when you stand up from sitting.  Fainting (syncope). Symptoms of very bad dehydration may include:  Changes in skin, such as: ? Cold and clammy skin. ? Blotchy (mottled) or pale skin. ? Skin that does not quickly return to normal after being lightly pinched and let go (poor skin turgor).  Changes in body fluids, such as: ? Feeling very thirsty. ? Your eyes making fewer tears. ? Not sweating when body temperature is high, such as in hot weather. ? Your body making very little pee.  Changes in vital signs, such as: ? Weak  pulse. ? Pulse that is more than 100 beats a minute when you are sitting still. ? Fast breathing. ? Low blood pressure.  Other changes, such as: ? Sunken eyes. ? Cold hands and feet. ? Confusion. ? Lack of energy (lethargy). ? Trouble waking up from sleep. ? Short-term weight loss. ? Unconsciousness. Follow these instructions at home:  If told by your doctor, drink an ORS: ? Make an ORS by using instructions on the package. ? Start by drinking small amounts, about  cup (120 mL) every 5-10 minutes. ? Slowly drink more until you have had the amount that your doctor said to have.  Drink enough clear fluid to keep your pee clear or pale yellow. If you were told to drink an ORS, finish the ORS first, then start slowly drinking clear fluids. Drink fluids such as: ? Water. Do not drink only water by itself. Doing that can make the salt (sodium) level in your body get too low (hyponatremia). ? Ice chips. ? Fruit juice that you have added water to (diluted). ? Low-calorie sports drinks.  Avoid: ? Alcohol. ? Drinks that have a lot of sugar. These include high-calorie sports drinks, fruit juice that does not have water added, and soda. ? Caffeine. ? Foods that are greasy or have a lot of fat or sugar.  Take over-the-counter and prescription medicines only as told by your doctor.  Do not take salt tablets. Doing that can make the salt  level in your body get too high (hypernatremia).  Eat foods that have minerals (electrolytes). Examples include bananas, oranges, potatoes, tomatoes, and spinach.  Keep all follow-up visits as told by your doctor. This is important. Contact a doctor if:  You have belly (abdominal) pain that: ? Gets worse. ? Stays in one area (localizes).  You have a rash.  You have a stiff neck.  You get angry or annoyed more easily than normal (irritability).  You are more sleepy than normal.  You have a harder time waking up than normal.  You  feel: ? Weak. ? Dizzy. ? Very thirsty. Get help right away if:  You have symptoms of very bad dehydration.  You cannot drink fluids without throwing up (vomiting).  Your symptoms get worse with treatment.  You have a fever.  You have a very bad headache.  You are throwing up or having watery poop (diarrhea) and it: ? Gets worse. ? Does not go away.  You have diarrhea for more than 24 hours.  You have blood or something green (bile) in your throw-up.  You have blood in your poop (stool). This may cause poop to look black and tarry.  You have not peed in 6-8 hours.  You have peed (urinated) only a small amount of very dark pee during 6-8 hours.  You pass out (faint).  Your heart rate when you are sitting still is more than 100 beats a minute.  You have trouble breathing. This information is not intended to replace advice given to you by your health care provider. Make sure you discuss any questions you have with your health care provider. Document Released: 07/04/2011 Document Revised: 02/02/2016 Document Reviewed: 09/08/2015 Elsevier Interactive Patient Education  [employer].

## 2017-08-07 NOTE — Progress Notes (Signed)
Subjective:  I acted as a Education administrator for Dr. Charlett Blake. [place], [place]  Patient ID: Laura [name], female    DOB: 1941/04/25, 77 y.o.   MRN: [id number]  No chief complaint on file.   HPI  Patient is in today for a follow up. She had neck surgery with Dr Christella Noa in October and is healing well. Still complains of some crepitus and stiffness in her muscles at times but her pain is greatly improved. No recent febrile illness or hospitalizations. She does note some ongoing chronic trouble with constipation but it is manageable with stool softeners and laxatives prn. Denies CP/palp/SOB/HA/fevers or GU c/o. Taking meds as prescribed. C/o some congestion with PND since her surgery.  Patient Care Team: [name], MD as PCP - General (Family Medicine) Calvert Cantor, MD as Consulting Physician (Ophthalmology) Laroy Apple, MD as Consulting Physician (Physical Medicine and Rehabilitation) Ashok Pall, MD as Consulting Physician (Neurosurgery) Almedia Balls, MD as Consulting Physician (Orthopedic Surgery)   Past Medical History:  Diagnosis Date  . Cancer ([place])    basal on back and nose  . Cataract   . Chicken pox as a child  . CKD (chronic kidney disease), stage III ([place]) 02/21/2017  . DDD (degenerative disc disease), cervical   . EBV infection    mono as child  . Fibrocystic breast 05/01/2014  . GERD (gastroesophageal reflux disease)    pt denies  . Hyperlipidemia   . Hypertension 20 yrs ago  . Measles as a child  . Medicare annual wellness visit, subsequent 02/01/2015  . Mumps as a child  . Neck pain 01/22/2016  . Other and unspecified hyperlipidemia 10/13/2013  . Overweight(278.02)   . PONV (postoperative nausea and vomiting)    pt would like something to prevent nausea  . Vasovagal reaction 07/15/2015   no syncope but close     Past Surgical History:  Procedure Laterality Date  . ABDOMINAL HYSTERECTOMY  29 yrs ago   heavy bleeding, ovaries left in place  . ANTERIOR CERVICAL  DECOMP/DISCECTOMY FUSION N/A 05/15/2017   Procedure: ANTERIOR CERVICAL DECOMPRESSION/DISCECTOMY FUSION CERVICL THREE CERVICAL FOUR, CERVICAL FOUR- CERVICAL FIVE;  Surgeon: Ashok Pall, MD;  Location: [facility];  Service: Neurosurgery;  Laterality: N/A;  . 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 COLONOSCOPY    . EYE SURGERY  11-27-2010   cataract surgery in both eyes  . POLYPECTOMY    . VAGINAL HYSTERECTOMY  77 yrs old   uterus removed    Family History  Problem Relation Age of Onset  . Heart disease Father   . Heart attack Father 15  . Hypertension Father   . Dementia Maternal Grandmother   . Heart disease Maternal Grandfather   . Hyperlipidemia Paternal Grandmother   . Cancer Paternal Grandfather        stomach  . Heart attack Paternal Grandfather   . Stomach cancer Paternal Grandfather   . Dementia Mother   . Pulmonary embolism Son   . Colon cancer Neg Hx   . Colon polyps Neg Hx   . Rectal cancer Neg Hx     Social History   Socioeconomic History  . Marital status: Married    Spouse name: Not on file  . Number of children: Not on file  . Years of education: Not on file  . Highest education level: Not on file  Social Needs  . Financial resource strain: Not on file  . Food insecurity - worry: Not  on file  . Food insecurity - inability: Not on file  . Transportation needs - medical: Not on file  . Transportation needs - non-medical: Not on file  Occupational History  . Not on file  Tobacco Use  . Smoking status: Former Smoker    Packs/day: 1.00    Years: 16.00    Pack years: 16.00    Types: Cigarettes    Start date: 07/29/1972  . Smokeless tobacco: Never Used  Substance and Sexual Activity  . Alcohol use: No    Alcohol/week: 0.0 oz  . Drug use: No  . Sexual activity: No    Comment: lives with husband no major dietary restrictions  Other Topics Concern  . Not on file  Social History Narrative  . Not on file     Outpatient Medications Prior to Visit  Medication Sig Dispense Refill  . acetaminophen (TYLENOL) 500 MG tablet Take 500 mg by mouth 2 (two) times daily as needed for mild pain.     . cholecalciferol (VITAMIN D) 1000 units tablet Take 1,000 Units by mouth daily.    . naproxen (NAPROSYN) 500 MG tablet Take 500 mg by mouth at bedtime.   1  . tiZANidine (ZANAFLEX) 4 MG tablet Take 1 tablet (4 mg total) by mouth every 6 (six) hours as needed for muscle spasms. 60 tablet 0  . losartan-hydrochlorothiazide (HYZAAR) 50-12.5 MG tablet TAKE 1 TABLET BY MOUTH DAILY. 90 tablet 0  . acetaminophen-codeine (TYLENOL #3) 300-30 MG tablet Take 1 tablet by mouth every 6 (six) hours as needed for moderate pain. 30 tablet 0  . colchicine 0.6 MG tablet 2 tabs by mouth now, followed by 1 tab one hour later. (Patient not taking: Reported on 05/05/2017) 3 tablet 0   No facility-administered medications prior to visit.     Allergies  Allergen Reactions  . Lisinopril Other (See Comments)    dizzy  . Statins Other (See Comments)    Muscle weakness   . Sulfa Antibiotics Other (See Comments)    "feels feverish"  . Tetracyclines & Related Other (See Comments)    GI upset    Review of Systems  Constitutional: Negative for fever and malaise/fatigue.  HENT: Positive for congestion.   Eyes: Negative for blurred vision.  Respiratory: Negative for cough and shortness of breath.   Cardiovascular: Negative for chest pain, palpitations and leg swelling.  Gastrointestinal: Negative for vomiting.  Musculoskeletal: Positive for neck pain. Negative for back pain.  Skin: Negative for rash.  Neurological: Negative for loss of consciousness and headaches.       Objective:    Physical Exam  Constitutional: She is oriented to person, place, and time. She appears well-developed and well-nourished. No distress.  HENT:  Head: Normocephalic and atraumatic.  Eyes: Conjunctivae are normal.  Neck: Normal range of motion.  No thyromegaly present.  Cardiovascular: Normal rate and regular rhythm.  Pulmonary/Chest: Effort normal and breath sounds normal. She has no wheezes.  Abdominal: Soft. Bowel sounds are normal. There is no tenderness.  Musculoskeletal: Normal range of motion. She exhibits no edema or deformity.  Scar anterior neck on left healing well.   Neurological: She is alert and oriented to person, place, and time.  Skin: Skin is warm and dry. She is not diaphoretic.  Psychiatric: She has a normal mood and affect.    BP 120/66 (BP Location: Left Arm, Patient Position: Sitting, Cuff Size: Normal)   Pulse 74   Temp 97.8 F (36.6 C) (Oral)  Resp 18   Wt 165 lb 12.8 oz (75.2 kg)   SpO2 96%   BMI 28.46 kg/m  Wt Readings from Last 3 Encounters:  08/07/17 165 lb 12.8 oz (75.2 kg)  05/15/17 174 lb 5 oz (79.1 kg)  05/09/17 174 lb 5 oz (79.1 kg)   BP Readings from Last 3 Encounters:  08/07/17 120/66  05/16/17 (!) 162/71  05/09/17 (!) 168/67     Immunization History  Administered Date(s) Administered  . Influenza, High Dose Seasonal PF 05/07/2016, 04/24/2017, 04/24/2017  . Influenza,inj,Quad PF,6+ Mos 04/29/2014, 07/10/2015  . Influenza-Unspecified 04/28/2013  . Pneumococcal Conjugate-13 04/29/2014  . Pneumococcal Polysaccharide-23 07/04/2009, 08/08/2010, 04/28/2013  . Td 07/29/2010  . Zoster 04/28/2013    Health Maintenance  Topic Date Due  . HEMOGLOBIN A1C  [birth year]  . FOOT EXAM  05/17/1952  . OPHTHALMOLOGY EXAM  05/17/1952  . TETANUS/TDAP  07/29/2020  . COLONOSCOPY  09/18/2020  . INFLUENZA VACCINE  Completed  . DEXA SCAN  Completed  . PNA vac Low Risk Adult  Completed    Lab Results  Component Value Date   WBC 6.7 05/09/2017   HGB 13.6 05/09/2017   HCT 42.0 05/09/2017   PLT 274 05/09/2017   GLUCOSE 92 05/09/2017   CHOL 221 (H) 02/21/2017   TRIG 149 02/21/2017   HDL 44 02/21/2017   LDLCALC 147 (H) 02/21/2017   ALT 10 11/26/2016   AST 14 11/26/2016   NA 137 05/09/2017    K 4.2 05/09/2017   CL 103 05/09/2017   CREATININE 0.96 05/09/2017   BUN 16 05/09/2017   CO2 24 05/09/2017   TSH 2.65 11/21/2016    Lab Results  Component Value Date   TSH 2.65 11/21/2016   Lab Results  Component Value Date   WBC 6.7 05/09/2017   HGB 13.6 05/09/2017   HCT 42.0 05/09/2017   MCV 89.6 05/09/2017   PLT 274 05/09/2017   Lab Results  Component Value Date   NA 137 05/09/2017   K 4.2 05/09/2017   CO2 24 05/09/2017   GLUCOSE 92 05/09/2017   BUN 16 05/09/2017   CREATININE 0.96 05/09/2017   BILITOT 0.3 11/26/2016   ALKPHOS 50 11/26/2016   AST 14 11/26/2016   ALT 10 11/26/2016   PROT 7.3 11/26/2016   ALBUMIN 4.4 11/26/2016   CALCIUM 9.7 05/09/2017   ANIONGAP 10 05/09/2017   GFR 54.98 (L) 11/26/2016   Lab Results  Component Value Date   CHOL 221 (H) 02/21/2017   Lab Results  Component Value Date   HDL 44 02/21/2017   Lab Results  Component Value Date   LDLCALC 147 (H) 02/21/2017   Lab Results  Component Value Date   TRIG 149 02/21/2017   Lab Results  Component Value Date   CHOLHDL 5.0 02/21/2017   No results found for: HGBA1C       Assessment & Plan:   Problem List Items Addressed This Visit    Neck pain    Had surgery with Dr Christella Noa on October 18 has been healing       Hypertension    Well controlled, no changes to meds. Encouraged heart healthy diet such as the DASH diet and exercise as tolerated.       Relevant Medications   losartan-hydrochlorothiazide (HYZAAR) 50-12.5 MG tablet   Other Relevant Orders   CBC   Comprehensive metabolic panel   TSH   HLD (hyperlipidemia)    Encouraged heart healthy diet, increase exercise, avoid trans fats, consider a  krill oil cap daily      Relevant Medications   losartan-hydrochlorothiazide (HYZAAR) 50-12.5 MG tablet   Other Relevant Orders   Lipid panel   Obesity (BMI 30-39.9)     ncrease exercise as tolerated. Needs 7-8 hours of sleep nightly. Avoid trans fats, eat small, frequent  meals every 4-5 hours with lean proteins, complex carbs and healthy fats. Minimize simple carbs      CKD (chronic kidney disease), stage III ([facility])    Check cmp today      Occipital neuralgia    Receives injections every few months from [facility] Dr Sheilah Mins      Constipation    Encouraged increased hydration and fiber in diet. Daily probiotics. If bowels not moving can use MOM 2 tbls po in 4 oz of warm prune juice by mouth every 2-3 days. If no results then repeat in 4 hours with  Dulcolax suppository pr, may repeat again in 4 more hours as needed. Seek care if symptoms worsen. Consider daily Miralax and/or Dulcolax if symptoms persist. Miralax and benefiber together once to twice daily         I have discontinued Kalila Holifield's colchicine and acetaminophen-codeine. I am also having her maintain her naproxen, cholecalciferol, acetaminophen, tiZANidine, and losartan-hydrochlorothiazide.  Meds ordered this encounter  Medications  . losartan-hydrochlorothiazide (HYZAAR) 50-12.5 MG tablet    Sig: Take 1 tablet by mouth daily.    Dispense:  90 tablet    Refill:  1    CMA served as Education administrator during this visit. History, Physical and Plan performed by medical provider. Documentation and orders reviewed and attested to.  Penni Homans, MD

## 2017-08-07 NOTE — Assessment & Plan Note (Signed)
Receives injections every few months from [facility] Dr Sheilah Mins

## 2017-08-07 NOTE — Assessment & Plan Note (Signed)
Encouraged increased hydration and fiber in diet. Daily probiotics. If bowels not moving can use MOM 2 tbls po in 4 oz of warm prune juice by mouth every 2-3 days. If no results then repeat in 4 hours with  Dulcolax suppository pr, may repeat again in 4 more hours as needed. Seek care if symptoms worsen. Consider daily Miralax and/or Dulcolax if symptoms persist. Miralax and benefiber together once to twice daily

## 2017-08-07 NOTE — Assessment & Plan Note (Signed)
Check cmp today 

## 2017-08-07 NOTE — Assessment & Plan Note (Signed)
Had surgery with Dr Christella Noa on October 18 has been healing

## 2017-08-08 DIAGNOSIS — H524 Presbyopia: Secondary | ICD-10-CM | POA: Diagnosis not present

## 2017-08-08 DIAGNOSIS — H5213 Myopia, bilateral: Secondary | ICD-10-CM | POA: Diagnosis not present

## 2017-08-08 DIAGNOSIS — H04123 Dry eye syndrome of bilateral lacrimal glands: Secondary | ICD-10-CM | POA: Diagnosis not present

## 2017-08-22 DIAGNOSIS — M47812 Spondylosis without myelopathy or radiculopathy, cervical region: Secondary | ICD-10-CM | POA: Diagnosis not present

## 2017-08-22 DIAGNOSIS — M542 Cervicalgia: Secondary | ICD-10-CM | POA: Diagnosis not present

## 2017-08-26 ENCOUNTER — Other Ambulatory Visit: Payer: Self-pay | Admitting: Neurosurgery

## 2017-08-26 ENCOUNTER — Ambulatory Visit (INDEPENDENT_AMBULATORY_CARE_PROVIDER_SITE_OTHER): Payer: Medicare Other

## 2017-08-26 DIAGNOSIS — M4322 Fusion of spine, cervical region: Secondary | ICD-10-CM | POA: Diagnosis not present

## 2017-08-26 DIAGNOSIS — M47812 Spondylosis without myelopathy or radiculopathy, cervical region: Secondary | ICD-10-CM | POA: Diagnosis not present

## 2017-08-26 DIAGNOSIS — Z6828 Body mass index (BMI) 28.0-28.9, adult: Secondary | ICD-10-CM | POA: Diagnosis not present

## 2017-08-26 DIAGNOSIS — M47892 Other spondylosis, cervical region: Secondary | ICD-10-CM | POA: Diagnosis not present

## 2017-09-03 DIAGNOSIS — M47812 Spondylosis without myelopathy or radiculopathy, cervical region: Secondary | ICD-10-CM | POA: Diagnosis not present

## 2017-09-03 DIAGNOSIS — M542 Cervicalgia: Secondary | ICD-10-CM | POA: Diagnosis not present

## 2017-09-14 ENCOUNTER — Other Ambulatory Visit: Payer: Self-pay

## 2017-09-14 ENCOUNTER — Encounter: Payer: Self-pay | Admitting: Family Medicine

## 2017-09-14 ENCOUNTER — Encounter (HOSPITAL_BASED_OUTPATIENT_CLINIC_OR_DEPARTMENT_OTHER): Payer: Self-pay | Admitting: Emergency Medicine

## 2017-09-14 ENCOUNTER — Emergency Department (HOSPITAL_BASED_OUTPATIENT_CLINIC_OR_DEPARTMENT_OTHER)
Admission: EM | Admit: 2017-09-14 | Discharge: 2017-09-14 | Disposition: A | Discharge status: Home / Self Care | Payer: Medicare Other | Attending: Emergency Medicine | Admitting: Emergency Medicine

## 2017-09-14 DIAGNOSIS — R319 Hematuria, unspecified: Secondary | ICD-10-CM | POA: Diagnosis present

## 2017-09-14 DIAGNOSIS — N3091 Cystitis, unspecified with hematuria: Secondary | ICD-10-CM | POA: Diagnosis not present

## 2017-09-14 DIAGNOSIS — N183 Chronic kidney disease, stage 3 (moderate): Secondary | ICD-10-CM | POA: Insufficient documentation

## 2017-09-14 DIAGNOSIS — Z85828 Personal history of other malignant neoplasm of skin: Secondary | ICD-10-CM | POA: Diagnosis not present

## 2017-09-14 DIAGNOSIS — I129 Hypertensive chronic kidney disease with stage 1 through stage 4 chronic kidney disease, or unspecified chronic kidney disease: Secondary | ICD-10-CM | POA: Insufficient documentation

## 2017-09-14 DIAGNOSIS — N3090 Cystitis, unspecified without hematuria: Secondary | ICD-10-CM | POA: Diagnosis not present

## 2017-09-14 DIAGNOSIS — R309 Painful micturition, unspecified: Secondary | ICD-10-CM | POA: Diagnosis not present

## 2017-09-14 DIAGNOSIS — Z79899 Other long term (current) drug therapy: Secondary | ICD-10-CM | POA: Diagnosis not present

## 2017-09-14 DIAGNOSIS — Z87891 Personal history of nicotine dependence: Secondary | ICD-10-CM | POA: Insufficient documentation

## 2017-09-14 LAB — URINALYSIS, MICROSCOPIC (REFLEX)

## 2017-09-14 LAB — URINALYSIS, ROUTINE W REFLEX MICROSCOPIC
Bilirubin Urine: NEGATIVE
Glucose, UA: NEGATIVE mg/dL
Ketones, ur: NEGATIVE mg/dL
Nitrite: NEGATIVE
Protein, ur: 100 mg/dL — AB
Specific Gravity, Urine: >1.03 — ABNORMAL HIGH (ref 1.005–1.030)
pH: 6 (ref 5.0–8.0)

## 2017-09-14 MED ORDER — CEPHALEXIN 500 MG PO CAPS: 500.0000 mg | ORAL_CAPSULE | Freq: Two times a day (BID) | ORAL | 0 refills | Status: AC

## 2017-09-14 NOTE — Discharge Instructions (Signed)
YOU MAY TAKE OVER THE COUNTER AZO (PYRIDIUM) FOR BLADDER SPASMS OR BURNING. RETURN TO ER IF YOU HAVE ANY ABDOMINAL OR BACK PAIN, FEVERS, OR VOMITING.

## 2017-09-14 NOTE — ED Provider Notes (Signed)
[facility] EMERGENCY DEPARTMENT Provider Note   CSN: [id number] Arrival date & time: 09/14/17  1103     History   Chief Complaint Chief Complaint  Patient presents with  . Hematuria    HPI Laura [name] is a 77 y.o. female.  77 year old female with past medical history below who presents with hematuria.  Patient states that last night she began having some blood in her urine that has persisted through today.  She began having some vulvar irritation and some mild burning with urination today.  She reports a history of hemorrhagic cystitis and states that this feels similar.  She denies any abdominal pain, back pain, vomiting, fevers, or recent illness.  No medications prior to arrival.   The history is provided by the patient.    Past Medical History:  Diagnosis Date  . Cancer ([place])    basal on back and nose  . Cataract   . Chicken pox as a child  . CKD (chronic kidney disease), stage III ([place]) 02/21/2017  . DDD (degenerative disc disease), cervical   . EBV infection    mono as child  . Fibrocystic breast 05/01/2014  . GERD (gastroesophageal reflux disease)    pt denies  . Hyperlipidemia   . Hypertension 20 yrs ago  . Measles as a child  . Medicare annual wellness visit, subsequent 02/01/2015  . Mumps as a child  . Neck pain 01/22/2016  . Other and unspecified hyperlipidemia 10/13/2013  . Overweight(278.02)   . PONV (postoperative nausea and vomiting)    pt would like something to prevent nausea  . Vasovagal reaction 07/15/2015   no syncope but close     Patient Active Problem List   Diagnosis Date Noted  . Occipital neuralgia 08/07/2017  . Constipation 08/07/2017  . 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 Left foot pain 05/01/2017  . Obesity (BMI 30-39.9) 02/21/2017  . CKD (chronic kidney disease), stage III ([place]) 02/21/2017  . Chest pain 02/20/2017  . Hypertension 02/20/2017  . HLD (hyperlipidemia) 02/20/2017  . Neck pain 01/22/2016  .  History of colonic polyps 07/15/2015  . Fibrocystic breast 05/01/2014  . 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 Decreased visual acuity 01/23/2014  . Sun-damaged skin 01/23/2014  . Hot flashes 01/23/2014  . Recurrent BCC (basal cell carcinoma) 10/13/2013  . Vitamin D deficiency 10/13/2013  . Cancer ([place])   . Cataract   . EBV infection     Past Surgical History:  Procedure Laterality Date  . ABDOMINAL HYSTERECTOMY  29 yrs ago   heavy bleeding, ovaries left in place  . ANTERIOR CERVICAL DECOMP/DISCECTOMY FUSION N/A 05/15/2017   Procedure: ANTERIOR CERVICAL DECOMPRESSION/DISCECTOMY FUSION CERVICL THREE CERVICAL FOUR, CERVICAL FOUR- CERVICAL FIVE;  Surgeon: Ashok Pall, MD;  Location: [facility];  Service: Neurosurgery;  Laterality: N/A;  . 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 COLONOSCOPY    . EYE SURGERY  11-27-2010   cataract surgery in both eyes  . POLYPECTOMY    . VAGINAL HYSTERECTOMY  77 yrs old   uterus removed    OB History    No data available       Home Medications    Prior to Admission medications   Medication Sig Start Date End Date Taking? Authorizing Provider  acetaminophen (TYLENOL) 500 MG tablet Take 500 mg by mouth 2 (two) times daily as needed for mild pain.     [provider]  cephALEXin (KEFLEX) 500 MG capsule Take 1 capsule (  500 mg total) by mouth 2 (two) times daily for 7 days. 09/14/17 09/21/17  Deetra Booton, Wenda Overland, MD  cholecalciferol (VITAMIN D) 1000 units tablet Take 1,000 Units by mouth daily.    [provider]  losartan-hydrochlorothiazide (HYZAAR) 50-12.5 MG tablet Take 1 tablet by mouth daily. 08/07/17   [name], MD  naproxen (NAPROSYN) 500 MG tablet Take 500 mg by mouth at bedtime.  11/11/16   [provider]  tiZANidine (ZANAFLEX) 4 MG tablet Take 1 tablet (4 mg total) by mouth every 6 (six) hours as needed for muscle spasms. 05/16/17   Ashok Pall, MD    Family History Family  History  Problem Relation Age of Onset  . Heart disease Father   . Heart attack Father 71  . Hypertension Father   . Dementia Maternal Grandmother   . Heart disease Maternal Grandfather   . Hyperlipidemia Paternal Grandmother   . Cancer Paternal Grandfather        stomach  . Heart attack Paternal Grandfather   . Stomach cancer Paternal Grandfather   . Dementia Mother   . Pulmonary embolism Son   . Colon cancer Neg Hx   . Colon polyps Neg Hx   . Rectal cancer Neg Hx     Social History Social History   Tobacco Use  . Smoking status: Former Smoker    Packs/day: 1.00    Years: 16.00    Pack years: 16.00    Types: Cigarettes    Start date: 07/29/1972  . Smokeless tobacco: Never Used  Substance Use Topics  . Alcohol use: No    Alcohol/week: 0.0 oz  . Drug use: No     Allergies   Lisinopril; Statins; Sulfa antibiotics; and Tetracyclines & related   Review of Systems Review of Systems All other systems reviewed and are negative except that which was mentioned in HPI   Physical Exam Updated Vital Signs BP (!) 144/85 (BP Location: Left Arm)   Pulse 95   Temp 98.4 F (36.9 C) (Oral)   Resp 18   Ht 5\' 4"  (1.626 m)   Wt 74.8 kg (165 lb)   SpO2 100%   BMI 28.32 kg/m   Physical Exam  Constitutional: She is oriented to person, place, and time. She appears well-developed and well-nourished. No distress.  HENT:  Head: Normocephalic and atraumatic.  Moist mucous membranes  Eyes: Conjunctivae are normal.  Neck: Neck supple.  Cardiovascular: Normal rate, regular rhythm and normal heart sounds.  No murmur heard. Pulmonary/Chest: Effort normal and breath sounds normal.  Abdominal: Soft. Bowel sounds are normal. She exhibits no distension. There is no tenderness.  Musculoskeletal: She exhibits no edema.  Neurological: She is alert and oriented to person, place, and time.  Fluent speech  Skin: Skin is warm and dry.  Psychiatric: Judgment normal.  Nursing note and  vitals reviewed.    ED Treatments / Results  Labs (all labs ordered are listed, but only abnormal results are displayed) Labs Reviewed  URINALYSIS, ROUTINE W REFLEX MICROSCOPIC - Abnormal; Notable for the following components:      Result Value   APPearance CLOUDY (*)    Specific Gravity, Urine >1.030 (*)    Hgb urine dipstick LARGE (*)    Protein, ur 100 (*)    Leukocytes, UA MODERATE (*)    All other components within normal limits  URINALYSIS, MICROSCOPIC (REFLEX) - Abnormal; Notable for the following components:   Bacteria, UA MANY (*)    Squamous Epithelial /  LPF 0-5 (*)    All other components within normal limits  URINE CULTURE    EKG  EKG Interpretation None       Radiology No results found.  Procedures Procedures (including critical care time)  Medications Ordered in ED Medications - No data to display   Initial Impression / Assessment and Plan / ED Course  I have reviewed the triage vital signs and the nursing notes.  Pertinent labs that were available during my care of the patient were reviewed by me and considered in my medical decision making (see chart for details).     Well appearing, afebrile, abd non-tender.  UA shows moderate leukocytes, large amount of RBCs, WBCs, and bacteria.  Urine culture sent.  Given her symptoms, I suspect hemorrhagic cystitis.  She has no abdominal or flank pain and no vomiting to suggest kidney stone.  Discussed treatment with Keflex and extensively reviewed return precautions.  Final Clinical Impressions(s) / ED Diagnoses   Final diagnoses:  Hemorrhagic cystitis    ED Discharge Orders        Ordered    cephALEXin (KEFLEX) 500 MG capsule  2 times daily     09/14/17 1222       Amair Shrout, Wenda Overland, MD 09/14/17 1521

## 2017-09-14 NOTE — ED Notes (Signed)
ED Provider at bedside. 

## 2017-09-14 NOTE — ED Triage Notes (Signed)
patient has hemorraghic cystitis in the past, she reports that she is having blood in her urine since last night

## 2017-09-15 ENCOUNTER — Ambulatory Visit: Payer: Medicare Other | Admitting: Physical Therapy

## 2017-09-16 ENCOUNTER — Encounter: Payer: Self-pay | Admitting: Family Medicine

## 2017-09-16 LAB — URINE CULTURE: Culture: >=100000 — AB

## 2017-09-17 ENCOUNTER — Telehealth: Payer: Self-pay | Admitting: Emergency Medicine

## 2017-09-17 NOTE — Telephone Encounter (Signed)
Post ED Visit - Positive Culture Follow-up  Culture report reviewed by antimicrobial stewardship pharmacist:  [x]  Elenor Quinones, Pharm.D. []  Heide Guile, Pharm.D., BCPS AQ-ID []  Parks Neptune, Pharm.D., BCPS []  Alycia Rossetti, Pharm.D., BCPS []  [place], Pharm.D., BCPS, AAHIVP []  Legrand Como, Pharm.D., BCPS, AAHIVP []  Salome Arnt, PharmD, BCPS []  Jalene Mullet, PharmD []  Vincenza Hews, PharmD, BCPS  Positive urine culture Treated with cephalexin, organism sensitive to the same and no further patient follow-up is required at this time.  Hazle Nordmann 09/17/2017, 9:50 AM

## 2017-09-23 ENCOUNTER — Telehealth: Payer: Self-pay | Admitting: Family Medicine

## 2017-09-23 NOTE — Telephone Encounter (Signed)
Copied from [place]. Topic: Quick Communication - See Telephone Encounter >> Sep 23, 2017  4:52 PM Percell Belt A wrote: CRM for notification. See Telephone encounter for: pt called in and said that she was on meds for a uti, she has completed her meds , she is not feeling any better.  She would like to know I if Dr Charlett Blake would call her in anything else without seeing her   Best number [phone]   09/23/17.

## 2017-09-24 ENCOUNTER — Encounter: Payer: Self-pay | Admitting: Family Medicine

## 2017-09-24 NOTE — Telephone Encounter (Signed)
thanks

## 2017-09-24 NOTE — Telephone Encounter (Signed)
Pt informed that PCP and her assistant if out of the office every Wednesday and 1/2 day on Fridays. Recommend she schedule an appt w/ another provider. Appt scheduled w/ Percell Miller tomorrow.

## 2017-09-24 NOTE — Telephone Encounter (Signed)
Please advise 

## 2017-09-25 ENCOUNTER — Other Ambulatory Visit (HOSPITAL_COMMUNITY)
Admission: RE | Admit: 2017-09-25 | Discharge: 2017-09-25 | Disposition: A | Discharge status: Home / Self Care | Payer: Medicare Other | Source: Ambulatory Visit | Attending: Medical | Admitting: Medical

## 2017-09-25 ENCOUNTER — Encounter: Payer: Self-pay | Admitting: Medical

## 2017-09-25 ENCOUNTER — Ambulatory Visit (INDEPENDENT_AMBULATORY_CARE_PROVIDER_SITE_OTHER): Payer: Medicare Other | Admitting: Medical

## 2017-09-25 VITALS — BP 150/80 | HR 71 | Temp 97.8°F | Resp 16

## 2017-09-25 DIAGNOSIS — Z87440 Personal history of urinary (tract) infections: Secondary | ICD-10-CM | POA: Insufficient documentation

## 2017-09-25 DIAGNOSIS — N898 Other specified noninflammatory disorders of vagina: Secondary | ICD-10-CM | POA: Diagnosis not present

## 2017-09-25 LAB — POCT URINALYSIS DIPSTICK
Appearance: NEGATIVE
Bilirubin, UA: NEGATIVE
Blood, UA: NEGATIVE
Glucose, UA: NEGATIVE
Ketones, UA: NEGATIVE
Leukocytes, UA: NEGATIVE
Nitrite, UA: NEGATIVE
Odor: NEGATIVE
Protein, UA: NEGATIVE
Spec Grav, UA: 1.025 (ref 1.010–1.025)
Urobilinogen, UA: NEGATIVE E.U./dL — AB
pH, UA: 6 (ref 5.0–8.0)

## 2017-09-25 MED ORDER — FLUCONAZOLE 150 MG PO TABS: 150.0000 mg | ORAL_TABLET | Freq: Once | ORAL | 0 refills | Status: AC

## 2017-09-25 NOTE — Patient Instructions (Signed)
With your vaginal irritation post treatment for UTI with Keflex, I will prescribe Diflucan tablet.  I will also go ahead and get urine culture and urine ancillary studies.  With the ancillary studies will evaluate if BV or yeast present.    I will follow the urine culture results and try to notify you on my chart when those are in.  Could update [place] as well by my chart if any changing or worsening signs or symptoms.  Follow-up in 7-10 days or as needed.

## 2017-09-25 NOTE — Progress Notes (Signed)
Subjective:    Patient ID: Laura [name], female    DOB: June 25, 1941, 77 y.o.   MRN: [id number]  HPI  2 sundays ago she had hemorrhagic cystitis per pt report. She took 5 days of antibiotic and got better. But over the past week feels irritated. No white discharge. No odor per pt. Pt denies any nausea, no fever, no chills  or sweats. No cva pain.  Pt states only 3 uti in her life.  Pt had positive culture recently. It came back e coli.  In past on gyn screening exams. She would have evidence of yeast infection but never any symptoms.  Pt not reporting any sexual activity.  Note the irritation to 3 days of finishing the antibiotic   Review of Systems  Constitutional: Negative for chills, fatigue and fever.  Respiratory: Negative for cough, choking, shortness of breath and wheezing.   Cardiovascular: Negative for chest pain and palpitations.  Gastrointestinal: Negative for abdominal pain, constipation and diarrhea.  Genitourinary: Negative for decreased urine volume, difficulty urinating, dysuria, flank pain, frequency, hematuria, pelvic pain and vaginal discharge.       Vaginal irritation.  Musculoskeletal: Negative for back pain and myalgias.  Neurological: Negative for dizziness and headaches.  Hematological: Negative for adenopathy. Does not bruise/bleed easily.  Psychiatric/Behavioral: Negative for behavioral problems and confusion.    Past Medical History:  Diagnosis Date  . Cancer ([place])    basal on back and nose  . Cataract   . Chicken pox as a child  . CKD (chronic kidney disease), stage III ([place]) 02/21/2017  . DDD (degenerative disc disease), cervical   . EBV infection    mono as child  . Fibrocystic breast 05/01/2014  . GERD (gastroesophageal reflux disease)    pt denies  . Hyperlipidemia   . Hypertension 20 yrs ago  . Measles as a child  . Medicare annual wellness visit, subsequent 02/01/2015  . Mumps as a child  . Neck pain 01/22/2016  . Other and  unspecified hyperlipidemia 10/13/2013  . Overweight(278.02)   . PONV (postoperative nausea and vomiting)    pt would like something to prevent nausea  . Vasovagal reaction 07/15/2015   no syncope but close      Social History   Socioeconomic History  . Marital status: Married    Spouse name: Not on file  . Number of children: Not on file  . Years of education: Not on file  . Highest education level: Not on file  Social Needs  . Financial resource strain: Not on file  . Food insecurity - worry: Not on file  . Food insecurity - inability: Not on file  . Transportation needs - medical: Not on file  . Transportation needs - non-medical: Not on file  Occupational History  . Not on file  Tobacco Use  . Smoking status: Former Smoker    Packs/day: 1.00    Years: 16.00    Pack years: 16.00    Types: Cigarettes    Start date: 07/29/1972  . Smokeless tobacco: Never Used  Substance and Sexual Activity  . Alcohol use: No    Alcohol/week: 0.0 oz  . Drug use: No  . Sexual activity: No    Comment: lives with husband no major dietary restrictions  Other Topics Concern  . Not on file  Social History Narrative  . Not on file    Past Surgical History:  Procedure Laterality Date  . ABDOMINAL HYSTERECTOMY  29 yrs ago   heavy  bleeding, ovaries left in place  . ANTERIOR CERVICAL DECOMP/DISCECTOMY FUSION N/A 05/15/2017   Procedure: ANTERIOR CERVICAL DECOMPRESSION/DISCECTOMY FUSION CERVICL THREE CERVICAL FOUR, CERVICAL FOUR- CERVICAL FIVE;  Surgeon: Ashok Pall, MD;  Location: [facility];  Service: Neurosurgery;  Laterality: N/A;  . 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 COLONOSCOPY    . EYE SURGERY  11-27-2010   cataract surgery in both eyes  . POLYPECTOMY    . VAGINAL HYSTERECTOMY  77 yrs old   uterus removed    Family History  Problem Relation Age of Onset  . Heart disease Father   . Heart attack Father 89  . Hypertension Father   . Dementia  Maternal Grandmother   . Heart disease Maternal Grandfather   . Hyperlipidemia Paternal Grandmother   . Cancer Paternal Grandfather        stomach  . Heart attack Paternal Grandfather   . Stomach cancer Paternal Grandfather   . Dementia Mother   . Pulmonary embolism Son   . Colon cancer Neg Hx   . Colon polyps Neg Hx   . Rectal cancer Neg Hx     Allergies  Allergen Reactions  . Lisinopril Other (See Comments)    dizzy  . Statins Other (See Comments)    Muscle weakness   . Sulfa Antibiotics Other (See Comments)    "feels feverish"  . Tetracyclines & Related Other (See Comments)    GI upset    Current Outpatient Medications on File Prior to Visit  Medication Sig Dispense Refill  . acetaminophen (TYLENOL) 500 MG tablet Take 500 mg by mouth 2 (two) times daily as needed for mild pain.     . cholecalciferol (VITAMIN D) 1000 units tablet Take 1,000 Units by mouth daily.    Marland Kitchen losartan-hydrochlorothiazide (HYZAAR) 50-12.5 MG tablet Take 1 tablet by mouth daily. 90 tablet 1  . naproxen (NAPROSYN) 500 MG tablet Take 500 mg by mouth at bedtime.   1  . tiZANidine (ZANAFLEX) 4 MG tablet Take 1 tablet (4 mg total) by mouth every 6 (six) hours as needed for muscle spasms. 60 tablet 0   No current facility-administered medications on file prior to visit.     BP (!) 150/80   Pulse 71   Temp 97.8 F (36.6 C) (Oral)   Resp 16   SpO2 98%       Objective:   Physical Exam  General- No acute distress. Pleasant patient. Neck- Full range of motion, no jvd Lungs- Clear, even and unlabored. Heart- regular rate and rhythm. Neurologic- CNII- XII grossly intact.  Back- no cva tenderness  Abdomen-soft, nondistended, positive bowel sounds, no rebound or guarding.  No suprapubic tenderness.     Assessment & Plan:  With your vaginal irritation post treatment for UTI with Keflex, I will prescribe Diflucan tablet.  I will also go ahead and get urine culture and urine ancillary studies.  With  the ancillary studies will evaluate if BV or yeast present.    I will follow the urine culture results and try to notify you on my chart when those are in.  Could update [place] as well by my chart if any changing or worsening signs or symptoms.  Follow-up in 7-10 days or as needed.  Mackie Pai, PA-C

## 2017-09-25 NOTE — Progress Notes (Signed)
urin

## 2017-09-29 ENCOUNTER — Encounter: Payer: Self-pay | Admitting: Physical Therapy

## 2017-09-29 LAB — URINE CYTOLOGY ANCILLARY ONLY
Bacterial vaginitis: NEGATIVE
Candida vaginitis: NEGATIVE

## 2017-09-30 ENCOUNTER — Encounter: Payer: Self-pay | Admitting: Medical

## 2017-10-16 DIAGNOSIS — M542 Cervicalgia: Secondary | ICD-10-CM | POA: Diagnosis not present

## 2017-10-16 DIAGNOSIS — M47812 Spondylosis without myelopathy or radiculopathy, cervical region: Secondary | ICD-10-CM | POA: Diagnosis not present

## 2017-11-18 DIAGNOSIS — M47892 Other spondylosis, cervical region: Secondary | ICD-10-CM | POA: Diagnosis not present

## 2017-11-18 DIAGNOSIS — M4722 Other spondylosis with radiculopathy, cervical region: Secondary | ICD-10-CM | POA: Diagnosis not present

## 2017-11-18 DIAGNOSIS — I10 Essential (primary) hypertension: Secondary | ICD-10-CM | POA: Diagnosis not present

## 2017-11-28 ENCOUNTER — Other Ambulatory Visit: Payer: Self-pay

## 2017-11-28 MED ORDER — LOSARTAN POTASSIUM-HCTZ 50-12.5 MG PO TABS: 1.0000 | ORAL_TABLET | Freq: Every day | ORAL | 1 refills | Status: DC

## 2017-12-05 DIAGNOSIS — L821 Other seborrheic keratosis: Secondary | ICD-10-CM | POA: Diagnosis not present

## 2017-12-05 DIAGNOSIS — Z85828 Personal history of other malignant neoplasm of skin: Secondary | ICD-10-CM | POA: Diagnosis not present

## 2017-12-05 DIAGNOSIS — H61002 Unspecified perichondritis of left external ear: Secondary | ICD-10-CM | POA: Diagnosis not present

## 2017-12-05 DIAGNOSIS — Z08 Encounter for follow-up examination after completed treatment for malignant neoplasm: Secondary | ICD-10-CM | POA: Diagnosis not present

## 2017-12-05 DIAGNOSIS — D485 Neoplasm of uncertain behavior of skin: Secondary | ICD-10-CM | POA: Diagnosis not present

## 2017-12-12 ENCOUNTER — Encounter: Payer: Self-pay | Admitting: Family Medicine

## 2017-12-12 DIAGNOSIS — M47812 Spondylosis without myelopathy or radiculopathy, cervical region: Secondary | ICD-10-CM | POA: Diagnosis not present

## 2017-12-12 DIAGNOSIS — M542 Cervicalgia: Secondary | ICD-10-CM | POA: Diagnosis not present

## 2018-01-02 ENCOUNTER — Ambulatory Visit (INDEPENDENT_AMBULATORY_CARE_PROVIDER_SITE_OTHER): Payer: Medicare Other | Admitting: Medical

## 2018-01-02 VITALS — BP 142/72 | HR 87 | Temp 98.1°F | Resp 16 | Ht 64.0 in

## 2018-01-02 DIAGNOSIS — L299 Pruritus, unspecified: Secondary | ICD-10-CM

## 2018-01-02 DIAGNOSIS — T7840XA Allergy, unspecified, initial encounter: Secondary | ICD-10-CM

## 2018-01-02 MED ORDER — HYDROXYZINE HCL 10 MG PO TABS: 10.0000 mg | ORAL_TABLET | Freq: Three times a day (TID) | ORAL | 0 refills | Status: DC | PRN

## 2018-01-02 MED ORDER — METHYLPREDNISOLONE 4 MG PO TABS: ORAL_TABLET | ORAL | 0 refills | Status: DC

## 2018-01-02 MED ORDER — METHYLPREDNISOLONE ACETATE 40 MG/ML IJ SUSP
40.0000 mg | Freq: Once | INTRAMUSCULAR | Status: AC
  Administered 2018-01-02: 40 mg via INTRAMUSCULAR

## 2018-01-02 NOTE — Progress Notes (Signed)
Subjective:    Patient ID: Laura [name], female    DOB: 09-Oct-1940, 77 y.o.   MRN: [id number]  HPI  Pt rt medial bicep,left wrist, left upper chest, between buttox and left side back.   Itching for 2 days.  Pt states she did got [employer] and got shrubs. She was inspecting different trees and shrubs. She has not been doing yard work.   Reports rash occurred after no known particular exposure except maybe plants at [facility]. On review pt does not report any suspicious exposure to soaps, creams, detergents, make up, lotions, detergents, animal exposure exposure,  or insect bites.  Pt rash is in the areas listed above. Pt reports no shortness of breath or wheezing. . Pt is not diabetic.  No sob or wheezing with the rash.   Hx of remote rare eczema.    Review of Systems  Constitutional: Negative for chills, fatigue and fever.  HENT: Negative for congestion, ear discharge and ear pain.   Respiratory: Negative for cough, chest tightness, shortness of breath and wheezing.   Cardiovascular: Negative for chest pain and palpitations.  Gastrointestinal: Negative for abdominal pain.  Musculoskeletal: Negative for back pain.  Skin: Positive for rash.  Neurological: Negative for dizziness, speech difficulty, weakness, light-headedness, numbness and headaches.  Hematological: Negative for adenopathy. Does not bruise/bleed easily.  Psychiatric/Behavioral: Negative for behavioral problems and confusion.   Past Medical History:  Diagnosis Date  . Cancer ([place])    basal on back and nose  . Cataract   . Chicken pox as a child  . CKD (chronic kidney disease), stage III ([place]) 02/21/2017  . DDD (degenerative disc disease), cervical   . EBV infection    mono as child  . Fibrocystic breast 05/01/2014  . GERD (gastroesophageal reflux disease)    pt denies  . Hyperlipidemia   . Hypertension 20 yrs ago  . Measles as a child  . Medicare annual wellness visit, subsequent 02/01/2015  . Mumps as a child    . Neck pain 01/22/2016  . Other and unspecified hyperlipidemia 10/13/2013  . Overweight(278.02)   . PONV (postoperative nausea and vomiting)    pt would like something to prevent nausea  . Vasovagal reaction 07/15/2015   no syncope but close      Social History   Socioeconomic History  . Marital status: Married    Spouse name: Not on file  . Number of children: Not on file  . Years of education: Not on file  . Highest education level: Not on file  Occupational History  . Not on file  Social Needs  . Financial resource strain: Not on file  . Food insecurity:    Worry: Not on file    Inability: Not on file  . Transportation needs:    Medical: Not on file    Non-medical: Not on file  Tobacco Use  . Smoking status: Former Smoker    Packs/day: 1.00    Years: 16.00    Pack years: 16.00    Types: Cigarettes    Start date: 07/29/1972  . Smokeless tobacco: Never Used  Substance and Sexual Activity  . Alcohol use: No    Alcohol/week: 0.0 oz  . Drug use: No  . Sexual activity: Never    Comment: lives with husband no major dietary restrictions  Lifestyle  . Physical activity:    Days per week: Not on file    Minutes per session: Not on file  . Stress: Not on file  Relationships  . Social connections:    Talks on phone: Not on file    Gets together: Not on file    Attends religious service: Not on file    Active member of club or organization: Not on file    Attends meetings of clubs or organizations: Not on file    Relationship status: Not on file  . Intimate partner violence:    Fear of current or ex partner: Not on file    Emotionally abused: Not on file    Physically abused: Not on file    Forced sexual activity: Not on file  Other Topics Concern  . Not on file  Social History Narrative  . Not on file    Past Surgical History:  Procedure Laterality Date  . ABDOMINAL HYSTERECTOMY  29 yrs ago   heavy bleeding, ovaries left in place  . ANTERIOR CERVICAL  DECOMP/DISCECTOMY FUSION N/A 05/15/2017   Procedure: ANTERIOR CERVICAL DECOMPRESSION/DISCECTOMY FUSION CERVICL THREE CERVICAL FOUR, CERVICAL FOUR- CERVICAL FIVE;  Surgeon: Ashok Pall, MD;  Location: [facility];  Service: Neurosurgery;  Laterality: N/A;  . 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 COLONOSCOPY    . EYE SURGERY  11-27-2010   cataract surgery in both eyes  . POLYPECTOMY    . VAGINAL HYSTERECTOMY  77 yrs old   uterus removed    Family History  Problem Relation Age of Onset  . Heart disease Father   . Heart attack Father 19  . Hypertension Father   . Dementia Maternal Grandmother   . Heart disease Maternal Grandfather   . Hyperlipidemia Paternal Grandmother   . Cancer Paternal Grandfather        stomach  . Heart attack Paternal Grandfather   . Stomach cancer Paternal Grandfather   . Dementia Mother   . Pulmonary embolism Son   . Colon cancer Neg Hx   . Colon polyps Neg Hx   . Rectal cancer Neg Hx     Allergies  Allergen Reactions  . Lisinopril Other (See Comments)    dizzy  . Statins Other (See Comments)    Muscle weakness   . Sulfa Antibiotics Other (See Comments)    "feels feverish"  . Tetracyclines & Related Other (See Comments)    GI upset    Current Outpatient Medications on File Prior to Visit  Medication Sig Dispense Refill  . acetaminophen (TYLENOL) 500 MG tablet Take 500 mg by mouth 2 (two) times daily as needed for mild pain.     . cholecalciferol (VITAMIN D) 1000 units tablet Take 1,000 Units by mouth daily.    Marland Kitchen losartan-hydrochlorothiazide (HYZAAR) 50-12.5 MG tablet Take 1 tablet by mouth daily. 90 tablet 1  . naproxen (NAPROSYN) 500 MG tablet Take 500 mg by mouth at bedtime.   1  . tiZANidine (ZANAFLEX) 4 MG tablet Take 1 tablet (4 mg total) by mouth every 6 (six) hours as needed for muscle spasms. 60 tablet 0   No current facility-administered medications on file prior to visit.     BP (!) 142/72    Pulse 87   Temp 98.1 F (36.7 C) (Oral)   Resp 16   Ht 5\' 4"  (1.626 m)   SpO2 100%   BMI 28.32 kg/m       Objective:   Physical Exam  General  Mental Status - Alert. General Appearance - Well groomed. Not in acute distress.  Skin Rashes- No Rashes.   Neck Neck- Supple.  No Masses.   Chest and Lung Exam Auscultation: Breath Sounds:-Clear even and unlabored.  Cardiovascular Auscultation:Rythm- Regular, rate and rhythm. Murmurs & Other Heart Sounds:Ausculatation of the heart reveal- No Murmurs.  Lymphatic Head & Neck General Head & Neck Lymphatics: Bilateral: Description- No Localized lymphadenopathy.  Skin- papular rash linear pattern medial rt bicep. Some papules left wrist. Some papules left upper chest (some rash left upper back and buttox but pt did not show me those areas)     Assessment & Plan:  The exact etiology of your  allergic reaction is  unknown . We gave you depo-medrol im injection 40 mg . I am also prescribing taper medrol prescription and hydroxyzine for itching. Your rash should gradually improve. If worsening or expanding  please notify us. If your rash reoccurs intermittently and no cause is identified then could consider allergist referral.  Reminder while on medrol advise not to use naprosyn  Pharmacy sent message indicating hydroxyzine not covered. So advise just to use benadryl at night.  Follow up in 7 days or as needed.  Mackie Pai, PA-C

## 2018-01-02 NOTE — Patient Instructions (Addendum)
The exact etiology of your  allergic reaction is  unknown . We gave you depo-medrol im injection 40 mg . I am also prescribing taper medrol prescription and hydroxyzine for itching. Your rash should gradually improve. If worsening or expanding  please notify us. If your rash reoccurs intermittently and no cause is identified then could consider allergist referral.  Reminder while on medrol advise not to use naprosyn  Pharmacy sent message indicating hydroxyzine not covered. So advise just to use benadryl at night.  Follow up in 7 days or as needed.

## 2018-01-03 ENCOUNTER — Encounter: Payer: Self-pay | Admitting: Medical

## 2018-01-12 DIAGNOSIS — M542 Cervicalgia: Secondary | ICD-10-CM | POA: Diagnosis not present

## 2018-01-12 DIAGNOSIS — M47812 Spondylosis without myelopathy or radiculopathy, cervical region: Secondary | ICD-10-CM | POA: Diagnosis not present

## 2018-02-12 ENCOUNTER — Ambulatory Visit (INDEPENDENT_AMBULATORY_CARE_PROVIDER_SITE_OTHER): Payer: Medicare Other | Admitting: Family Medicine

## 2018-02-12 ENCOUNTER — Encounter: Payer: Self-pay | Admitting: Family Medicine

## 2018-02-12 VITALS — BP 126/76 | HR 71 | Temp 98.0°F | Resp 18 | Ht 64.0 in | Wt 166.0 lb

## 2018-02-12 DIAGNOSIS — Z0000 Encounter for general adult medical examination without abnormal findings: Secondary | ICD-10-CM

## 2018-02-12 DIAGNOSIS — M542 Cervicalgia: Secondary | ICD-10-CM | POA: Diagnosis not present

## 2018-02-12 DIAGNOSIS — I10 Essential (primary) hypertension: Secondary | ICD-10-CM

## 2018-02-12 DIAGNOSIS — M5481 Occipital neuralgia: Secondary | ICD-10-CM | POA: Diagnosis not present

## 2018-02-12 DIAGNOSIS — E785 Hyperlipidemia, unspecified: Secondary | ICD-10-CM | POA: Diagnosis not present

## 2018-02-12 DIAGNOSIS — N183 Chronic kidney disease, stage 3 unspecified: Secondary | ICD-10-CM

## 2018-02-12 LAB — LIPID PANEL
Cholesterol: 226 mg/dL — ABNORMAL HIGH (ref 0–200)
HDL: 53.6 mg/dL (ref >39.00)
LDL Cholesterol: 139 mg/dL — ABNORMAL HIGH (ref 0–99)
NonHDL: 172.57
Total CHOL/HDL Ratio: 4
Triglycerides: 167 mg/dL — ABNORMAL HIGH (ref 0.0–149.0)
VLDL: 33.4 mg/dL (ref 0.0–40.0)

## 2018-02-12 LAB — COMPREHENSIVE METABOLIC PANEL
ALT: 11 U/L (ref 0–35)
AST: 14 U/L (ref 0–37)
Albumin: 4.3 g/dL (ref 3.5–5.2)
Alkaline Phosphatase: 46 U/L (ref 39–117)
BUN: 19 mg/dL (ref 6–23)
CO2: 31 mEq/L (ref 19–32)
Calcium: 9.4 mg/dL (ref 8.4–10.5)
Chloride: 101 mEq/L (ref 96–112)
Creatinine, Ser: 0.94 mg/dL (ref 0.40–1.20)
GFR: 61.58 mL/min (ref >60.00)
Glucose, Bld: 92 mg/dL (ref 70–99)
Potassium: 4.1 mEq/L (ref 3.5–5.1)
Sodium: 137 mEq/L (ref 135–145)
Total Bilirubin: 0.4 mg/dL (ref 0.2–1.2)
Total Protein: 6.6 g/dL (ref 6.0–8.3)

## 2018-02-12 LAB — CBC
HCT: 41.4 % (ref 36.0–46.0)
Hemoglobin: 13.7 g/dL (ref 12.0–15.0)
MCHC: 33.1 g/dL (ref 30.0–36.0)
MCV: 89.9 fl (ref 78.0–100.0)
Platelets: 258 10*3/uL (ref 150.0–400.0)
RBC: 4.6 Mil/uL (ref 3.87–5.11)
RDW: 14 % (ref 11.5–15.5)
WBC: 5.9 10*3/uL (ref 4.0–10.5)

## 2018-02-12 LAB — TSH: TSH: 2.73 u[IU]/mL (ref 0.35–4.50)

## 2018-02-12 NOTE — Assessment & Plan Note (Signed)
Well controlled, no changes to meds. Encouraged heart healthy diet such as the DASH diet and exercise as tolerated.  °

## 2018-02-12 NOTE — Assessment & Plan Note (Addendum)
Patient encouraged to maintain heart healthy diet, regular exercise, adequate sleep. Consider daily probiotics. Take medications as prescribed. Labs ordered and reviewed 

## 2018-02-12 NOTE — Assessment & Plan Note (Signed)
Dr Stephenie Acres gives her occipital neuralgia shots at times. Using Naproxen 500 mg with food, Pennsaid prn

## 2018-02-12 NOTE — Assessment & Plan Note (Signed)
Hydrate well and avoid OTC supplements.

## 2018-02-12 NOTE — Patient Instructions (Addendum)
Hydrate with 64 oz of clear fluids daily and extra if you drink caffeine or alcohol, eat protein every 4 hours or so.  Lidocaine gel by Aspercreme, Salon Pas and Icy Hot for the neck pain  Shingrix is the new shot, 2 shots over 2-6 months at the pharmacy  Last Tetanus shot was in 2012 Preventive Care 76 Years and Older, Female Preventive care refers to lifestyle choices and visits with your health care provider that can promote health and wellness. What does preventive care include?  A yearly physical exam. This is also called an annual well check.  Dental exams once or twice a year.  Routine eye exams. Ask your health care provider how often you should have your eyes checked.  Personal lifestyle choices, including: ? Daily care of your teeth and gums. ? Regular physical activity. ? Eating a healthy diet. ? Avoiding tobacco and drug use. ? Limiting alcohol use. ? Practicing safe sex. ? Taking low-dose aspirin every day. ? Taking vitamin and mineral supplements as recommended by your health care provider. What happens during an annual well check? The services and screenings done by your health care provider during your annual well check will depend on your age, overall health, lifestyle risk factors, and family history of disease. Counseling Your health care provider may ask you questions about your:  Alcohol use.  Tobacco use.  Drug use.  Emotional well-being.  Home and relationship well-being.  Sexual activity.  Eating habits.  History of falls.  Memory and ability to understand (cognition).  Work and work Statistician.  Reproductive health.  Screening You may have the following tests or measurements:  Height, weight, and BMI.  Blood pressure.  Lipid and cholesterol levels. These may be checked every 5 years, or more frequently if you are over 76 years old.  Skin check.  Lung cancer screening. You may have this screening every year starting at age 76 if  you have a 30-pack-year history of smoking and currently smoke or have quit within the past 15 years.  Fecal occult blood test (FOBT) of the stool. You may have this test every year starting at age 76.  Flexible sigmoidoscopy or colonoscopy. You may have a sigmoidoscopy every 5 years or a colonoscopy every 10 years starting at age 76.  Hepatitis C blood test.  Hepatitis B blood test.  Sexually transmitted disease (STD) testing.  Diabetes screening. This is done by checking your blood sugar (glucose) after you have not eaten for a while (fasting). You may have this done every 1-3 years.  Bone density scan. This is done to screen for osteoporosis. You may have this done starting at age 76.  Mammogram. This may be done every 1-2 years. Talk to your health care provider about how often you should have regular mammograms.  Talk with your health care provider about your test results, treatment options, and if necessary, the need for more tests. Vaccines Your health care provider may recommend certain vaccines, such as:  Influenza vaccine. This is recommended every year.  Tetanus, diphtheria, and acellular pertussis (Tdap, Td) vaccine. You may need a Td booster every 10 years.  Varicella vaccine. You may need this if you have not been vaccinated.  Zoster vaccine. You may need this after age 76.  Measles, mumps, and rubella (MMR) vaccine. You may need at least one dose of MMR if you were born in 1957 or later. You may also need a second dose.  Pneumococcal 13-valent conjugate (PCV13) vaccine. One  dose is recommended after age 76.  Pneumococcal polysaccharide (PPSV23) vaccine. One dose is recommended after age 76.  Meningococcal vaccine. You may need this if you have certain conditions.  Hepatitis A vaccine. You may need this if you have certain conditions or if you travel or work in places where you may be exposed to hepatitis A.  Hepatitis B vaccine. You may need this if you have  certain conditions or if you travel or work in places where you may be exposed to hepatitis B.  Haemophilus influenzae type b (Hib) vaccine. You may need this if you have certain conditions.  Talk to your health care provider about which screenings and vaccines you need and how often you need them. This information is not intended to replace advice given to you by your health care provider. Make sure you discuss any questions you have with your health care provider. Document Released: 08/11/2015 Document Revised: 04/03/2016 Document Reviewed: 05/16/2015 Elsevier Interactive Patient Education  [employer].

## 2018-02-12 NOTE — Progress Notes (Signed)
. Subjective:  I acted as a Education administrator for Dr. Charlett Blake. [place], [place]  Patient ID: Laura [name], female    DOB: [birth year], 77 y.o.   MRN: [id number]  No chief complaint on file.   HPI  Patient is in today for an annual exam. She has no acute concerns. She is following up on her HTN, HLD and other medical concerns. No recent febrile illness or acute hospitalizations. Denies CP/palp/SOB/HA/congestion/fevers/GI or GU c/o. Taking meds as prescribed. She has been struggling with increased neck pain recently. She denies any falls or injury. No radicular symptoms down the arms. She is managing her activities of daily living well. Is trying to maintain heart healthy diet and to exercise regularly.    Patient Care Team: [name], MD as PCP - General (Family Medicine) Calvert Cantor, MD as Consulting Physician (Ophthalmology) Laroy Apple, MD as Consulting Physician (Physical Medicine and Rehabilitation) Ashok Pall, MD as Consulting Physician (Neurosurgery) Almedia Balls, MD as Consulting Physician (Orthopedic Surgery)   Past Medical History:  Diagnosis Date  . Cancer ([place])    basal on back and nose  . Cataract   . Chicken pox as a child  . CKD (chronic kidney disease), stage III ([place]) 02/21/2017  . DDD (degenerative disc disease), cervical   . EBV infection    mono as child  . Fibrocystic breast 05/01/2014  . GERD (gastroesophageal reflux disease)    pt denies  . Hyperlipidemia   . Hypertension 20 yrs ago  . Measles as a child  . Medicare annual wellness visit, subsequent 02/01/2015  . Mumps as a child  . Neck pain 01/22/2016  . Other and unspecified hyperlipidemia 10/13/2013  . Overweight(278.02)   . PONV (postoperative nausea and vomiting)    pt would like something to prevent nausea  . Vasovagal reaction 07/15/2015   no syncope but close     Past Surgical History:  Procedure Laterality Date  . ABDOMINAL HYSTERECTOMY  29 yrs ago   heavy bleeding, ovaries left in place    . ANTERIOR CERVICAL DECOMP/DISCECTOMY FUSION N/A 05/15/2017   Procedure: ANTERIOR CERVICAL DECOMPRESSION/DISCECTOMY FUSION CERVICL THREE CERVICAL FOUR, CERVICAL FOUR- CERVICAL FIVE;  Surgeon: Ashok Pall, MD;  Location: [facility];  Service: Neurosurgery;  Laterality: N/A;  . 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 COLONOSCOPY    . EYE SURGERY  11-27-2010   cataract surgery in both eyes  . POLYPECTOMY    . VAGINAL HYSTERECTOMY  77 yrs old   uterus removed    Family History  Problem Relation Age of Onset  . Heart disease Father   . Heart attack Father 14  . Hypertension Father   . Dementia Maternal Grandmother   . Heart disease Maternal Grandfather   . Hyperlipidemia Paternal Grandmother   . Cancer Paternal Grandfather        stomach  . Heart attack Paternal Grandfather   . Stomach cancer Paternal Grandfather   . Dementia Mother   . Pulmonary embolism Son   . Vasculitis Son   . Pyruvate dehydrogenase deficiency Son   . Other Son        vasculitis led to PE  . Colon cancer Neg Hx   . Colon polyps Neg Hx   . Rectal cancer Neg Hx     Social History   Socioeconomic History  . Marital status: Married    Spouse name: Not on file  . Number of children: Not on file  . Years  of education: Not on file  . Highest education level: Not on file  Occupational History  . Not on file  Social Needs  . Financial resource strain: Not on file  . Food insecurity:    Worry: Not on file    Inability: Not on file  . Transportation needs:    Medical: Not on file    Non-medical: Not on file  Tobacco Use  . Smoking status: Former Smoker    Packs/day: 1.00    Years: 16.00    Pack years: 16.00    Types: Cigarettes    Start date: 07/29/1972  . Smokeless tobacco: Never Used  Substance and Sexual Activity  . Alcohol use: No    Alcohol/week: 0.0 oz  . Drug use: No  . Sexual activity: Never    Comment: lives with husband no major dietary restrictions   Lifestyle  . Physical activity:    Days per week: Not on file    Minutes per session: Not on file  . Stress: Not on file  Relationships  . Social connections:    Talks on phone: Not on file    Gets together: Not on file    Attends religious service: Not on file    Active member of club or organization: Not on file    Attends meetings of clubs or organizations: Not on file    Relationship status: Not on file  . Intimate partner violence:    Fear of current or ex partner: Not on file    Emotionally abused: Not on file    Physically abused: Not on file    Forced sexual activity: Not on file  Other Topics Concern  . Not on file  Social History Narrative  . Not on file    Outpatient Medications Prior to Visit  Medication Sig Dispense Refill  . acetaminophen (TYLENOL) 500 MG tablet Take 500 mg by mouth 2 (two) times daily as needed for mild pain.     . cholecalciferol (VITAMIN D) 1000 units tablet Take 1,000 Units by mouth daily.    . hydrOXYzine (ATARAX/VISTARIL) 10 MG tablet Take 1 tablet (10 mg total) by mouth 3 (three) times daily as needed. 21 tablet 0  . losartan-hydrochlorothiazide (HYZAAR) 50-12.5 MG tablet Take 1 tablet by mouth daily. 90 tablet 1  . naproxen (NAPROSYN) 500 MG tablet Take 500 mg by mouth at bedtime.   1  . tiZANidine (ZANAFLEX) 4 MG tablet Take 1 tablet (4 mg total) by mouth every 6 (six) hours as needed for muscle spasms. 60 tablet 0  . methylPREDNISolone (MEDROL) 4 MG tablet 4 tab po bid day 1  3 tab po bid day 2 2 tab po  Bid day 3 2 tab po day 4 20 tablet 0   No facility-administered medications prior to visit.     Allergies  Allergen Reactions  . Lisinopril Other (See Comments)    dizzy  . Statins Other (See Comments)    Muscle weakness   . Sulfa Antibiotics Other (See Comments)    "feels feverish"  . Tetracyclines & Related Other (See Comments)    GI upset  . Benadryl [Diphenhydramine] Other (See Comments)    Urinary incontinence stopped  when med discontinued    Review of Systems  Constitutional: Negative for fever and malaise/fatigue.  HENT: Negative for congestion.   Eyes: Negative for blurred vision.  Respiratory: Negative for cough and shortness of breath.   Cardiovascular: Negative for chest pain, palpitations and leg swelling.  Gastrointestinal: Negative for vomiting.  Musculoskeletal: Positive for neck pain. Negative for back pain.  Skin: Negative for rash.  Neurological: Negative for loss of consciousness and headaches.       Objective:    Physical Exam  Constitutional: She is oriented to person, place, and time. She appears well-developed and well-nourished. No distress.  HENT:  Head: Normocephalic and atraumatic.  Eyes: Conjunctivae are normal.  Neck: Normal range of motion. No thyromegaly present.  Cardiovascular: Normal rate and regular rhythm.  Pulmonary/Chest: Effort normal and breath sounds normal. She has no wheezes.  Abdominal: Soft. Bowel sounds are normal. There is no tenderness.  Musculoskeletal: Normal range of motion. She exhibits no edema or deformity.  Neurological: She is alert and oriented to person, place, and time.  Skin: Skin is warm and dry. She is not diaphoretic.  Psychiatric: She has a normal mood and affect.    BP 126/76 (BP Location: Left Arm, Patient Position: Sitting, Cuff Size: Normal)   Pulse 71   Temp 98 F (36.7 C) (Oral)   Resp 18   Ht 5\' 4"  (1.626 m)   Wt 166 lb (75.3 kg)   SpO2 98%   BMI 28.49 kg/m  Wt Readings from Last 3 Encounters:  02/12/18 166 lb (75.3 kg)  09/14/17 165 lb (74.8 kg)  08/07/17 165 lb 12.8 oz (75.2 kg)   BP Readings from Last 3 Encounters:  02/12/18 126/76  01/02/18 (!) 142/72  09/25/17 (!) 150/80     Immunization History  Administered Date(s) Administered  . Influenza, High Dose Seasonal PF 05/07/2016, 04/24/2017, 04/24/2017  . Influenza,inj,Quad PF,6+ Mos 04/29/2014, 07/10/2015  . Influenza-Unspecified 04/28/2013  .  Pneumococcal Conjugate-13 04/29/2014  . Pneumococcal Polysaccharide-23 07/04/2009, 08/08/2010, 04/28/2013  . Td 07/29/2010  . Zoster 04/28/2013    Health Maintenance  Topic Date Due  . INFLUENZA VACCINE  02/26/2018  . TETANUS/TDAP  07/29/2020  . COLONOSCOPY  09/18/2020  . DEXA SCAN  Completed  . PNA vac Low Risk Adult  Completed  . FOOT EXAM  Discontinued  . HEMOGLOBIN A1C  Discontinued  . OPHTHALMOLOGY EXAM  Discontinued    Lab Results  Component Value Date   WBC 5.9 02/12/2018   HGB 13.7 02/12/2018   HCT 41.4 02/12/2018   PLT 258.0 02/12/2018   GLUCOSE 92 02/12/2018   CHOL 226 (H) 02/12/2018   TRIG 167.0 (H) 02/12/2018   HDL 53.60 02/12/2018   LDLCALC 139 (H) 02/12/2018   ALT 11 02/12/2018   AST 14 02/12/2018   NA 137 02/12/2018   K 4.1 02/12/2018   CL 101 02/12/2018   CREATININE 0.94 02/12/2018   BUN 19 02/12/2018   CO2 31 02/12/2018   TSH 2.73 02/12/2018    Lab Results  Component Value Date   TSH 2.73 02/12/2018   Lab Results  Component Value Date   WBC 5.9 02/12/2018   HGB 13.7 02/12/2018   HCT 41.4 02/12/2018   MCV 89.9 02/12/2018   PLT 258.0 02/12/2018   Lab Results  Component Value Date   NA 137 02/12/2018   K 4.1 02/12/2018   CO2 31 02/12/2018   GLUCOSE 92 02/12/2018   BUN 19 02/12/2018   CREATININE 0.94 02/12/2018   BILITOT 0.4 02/12/2018   ALKPHOS 46 02/12/2018   AST 14 02/12/2018   ALT 11 02/12/2018   PROT 6.6 02/12/2018   ALBUMIN 4.3 02/12/2018   CALCIUM 9.4 02/12/2018   ANIONGAP 10 05/09/2017   GFR 61.58 02/12/2018   Lab Results  Component  Value Date   CHOL 226 (H) 02/12/2018   Lab Results  Component Value Date   HDL 53.60 02/12/2018   Lab Results  Component Value Date   LDLCALC 139 (H) 02/12/2018   Lab Results  Component Value Date   TRIG 167.0 (H) 02/12/2018   Lab Results  Component Value Date   CHOLHDL 4 02/12/2018   No results found for: HGBA1C       Assessment & Plan:   Problem List Items Addressed This  Visit    Preventative health care    Patient encouraged to maintain heart healthy diet, regular exercise, adequate sleep. Consider daily probiotics. Take medications as prescribed. Labs ordered and reviewed      Neck pain    Encouraged moist heat and gentle stretching as tolerated. May try NSAIDs and prescription meds as directed and report if symptoms worsen or seek immediate care. Try topical lidocaine prn. Report worsening symptoms      Hypertension    Well controlled, no changes to meds. Encouraged heart healthy diet such as the DASH diet and exercise as tolerated.       Relevant Orders   CBC (Completed)   Comprehensive metabolic panel (Completed)   TSH (Completed)   HLD (hyperlipidemia)    Encouraged heart healthy diet, increase exercise, avoid trans fats, consider a krill oil cap daily      Relevant Orders   Lipid panel (Completed)   CKD (chronic kidney disease), stage III ([place])    Hydrate well and avoid OTC supplements.       Occipital neuralgia    Dr Stephenie Acres gives her occipital neuralgia shots at times. Using Naproxen 500 mg with food, Pennsaid prn          I have discontinued Lelan Pons Rossy's methylPREDNISolone. I am also having her maintain her naproxen, cholecalciferol, acetaminophen, tiZANidine, losartan-hydrochlorothiazide, and hydrOXYzine.  No orders of the defined types were placed in this encounter.   CMA served as Education administrator during this visit. History, Physical and Plan performed by medical provider. Documentation and orders reviewed and attested to.  Penni Homans, MD

## 2018-02-12 NOTE — Assessment & Plan Note (Signed)
Encouraged heart healthy diet, increase exercise, avoid trans fats, consider a krill oil cap daily 

## 2018-02-15 NOTE — Assessment & Plan Note (Signed)
Encouraged moist heat and gentle stretching as tolerated. May try NSAIDs and prescription meds as directed and report if symptoms worsen or seek immediate care. Try topical lidocaine prn. Report worsening symptoms

## 2018-02-16 ENCOUNTER — Other Ambulatory Visit: Payer: Self-pay | Admitting: Family Medicine

## 2018-02-23 ENCOUNTER — Ambulatory Visit: Payer: Self-pay

## 2018-02-23 NOTE — Telephone Encounter (Signed)
Pt. called with report of having a bowel movement today and noted drops of blood in the toilet and on toilet tissue after BM.  Reported she could see the stool was normal color and size.  Stated the toilet  water was a diluted red color. Reported had a 2nd BM but was "too scared to look at the toilet bowl, but had some blood on the toilet tissue."  Stated she has struggle with constipation all her life.  Reported has BM every 2-3 days.  Reported some abdominal discomfort in lower abdomen with BM today.  Stated there has not been any signs of bleeding between stools.  Reported had a colonoscopy 2 years ago.  It was noted in the colonoscopy report that the pt. did have "Grade 1 internal hemorrhoids", at that time.  Denied dizziness or nausea/ vomiting.  Stated today is the 1st time she has noted any blood with BM.  Pt. verb. being very nervous about the presence of blood in her stool.  Appt. Given for 7/30 at PCP office;  Care advice given per protocol.  Verb. Understanding.  Agrees with plan.         Reason for Disposition . MODERATE rectal bleeding (small blood clots, passing blood without stool, or toilet water turns red)  Answer Assessment - Initial Assessment Questions 1. APPEARANCE of BLOOD: "What color is it?" "Is it passed separately, on the surface of the stool, or mixed in with the stool?"      Red blood noted in toilet water and on toilet tissue 2. AMOUNT: "How much blood was passed?"     unsure 3. FREQUENCY: "How many times has blood been passed with the stools?"     X 2 today 4. ONSET: "When was the blood first seen in the stools?" (Days or weeks)      today 5. DIARRHEA: "Is there also some diarrhea?" If so, ask: "How many diarrhea stools were passed in past 24 hours?"      No  6. CONSTIPATION: "Do you have constipation?" If so, "How bad is it?"     Always has had constipation  7. RECURRENT SYMPTOMS: "Have you had blood in your stools before?" If so, ask: "When was the last time?" and  "What happened that time?"     no 8. BLOOD THINNERS: "Do you take any blood thinners?" (e.g., Coumadin/warfarin, Pradaxa/dabigatran, aspirin)     Is on Naproxyn daily  and takes Ibuprofen prn 9. OTHER SYMPTOMS: "Do you have any other symptoms?"  (e.g., abdominal pain, vomiting, dizziness, fever)     Denied dizziness; some discomfort in lower abdomen, has BM about every 2-3 days; normal size and color today 10. PREGNANCY: "Is there any chance you are pregnant?" "When was your last menstrual period?"       N/a  Protocols used: RECTAL BLEEDING-A-AH

## 2018-02-24 ENCOUNTER — Telehealth: Payer: Self-pay | Admitting: Family Medicine

## 2018-02-24 ENCOUNTER — Encounter: Payer: Self-pay | Admitting: Family Medicine

## 2018-02-24 ENCOUNTER — Ambulatory Visit (INDEPENDENT_AMBULATORY_CARE_PROVIDER_SITE_OTHER): Payer: Medicare Other | Admitting: Family Medicine

## 2018-02-24 VITALS — BP 116/57 | HR 85 | Temp 98.5°F | Ht 64.0 in

## 2018-02-24 DIAGNOSIS — K921 Melena: Secondary | ICD-10-CM

## 2018-02-24 DIAGNOSIS — K648 Other hemorrhoids: Secondary | ICD-10-CM | POA: Diagnosis not present

## 2018-02-24 LAB — CBC WITH DIFFERENTIAL/PLATELET
Basophils Absolute: 0 10*3/uL (ref 0.0–0.1)
Basophils Relative: 0.7 % (ref 0.0–3.0)
Eosinophils Absolute: 0.1 10*3/uL (ref 0.0–0.7)
Eosinophils Relative: 1.2 % (ref 0.0–5.0)
HCT: 40 % (ref 36.0–46.0)
Hemoglobin: 13.6 g/dL (ref 12.0–15.0)
Lymphocytes Relative: 25.2 % (ref 12.0–46.0)
Lymphs Abs: 1.2 10*3/uL (ref 0.7–4.0)
MCHC: 33.9 g/dL (ref 30.0–36.0)
MCV: 88.7 fl (ref 78.0–100.0)
Monocytes Absolute: 0.5 10*3/uL (ref 0.1–1.0)
Monocytes Relative: 9.3 % (ref 3.0–12.0)
Neutro Abs: 3.1 10*3/uL (ref 1.4–7.7)
Neutrophils Relative %: 63.6 % (ref 43.0–77.0)
Platelets: 208 10*3/uL (ref 150.0–400.0)
RBC: 4.51 Mil/uL (ref 3.87–5.11)
RDW: 14.3 % (ref 11.5–15.5)
WBC: 4.9 10*3/uL (ref 4.0–10.5)

## 2018-02-24 LAB — POC HEMOCCULT BLD/STL (OFFICE/1-CARD/DIAGNOSTIC): Fecal Occult Blood, POC: POSITIVE — AB

## 2018-02-24 MED ORDER — HYDROCORTISONE ACETATE 25 MG RE SUPP: 25.0000 mg | Freq: Two times a day (BID) | RECTAL | 0 refills | Status: DC

## 2018-02-24 NOTE — Patient Instructions (Signed)
Hemorrhoids Hemorrhoids are swollen veins in and around the rectum or anus. There are two types of hemorrhoids:  Internal hemorrhoids. These occur in the veins that are just inside the rectum. They may poke through to the outside and become irritated and painful.  External hemorrhoids. These occur in the veins that are outside of the anus and can be felt as a painful swelling or hard lump near the anus.  Most hemorrhoids do not cause serious problems, and they can be managed with home treatments such as diet and lifestyle changes. If home treatments do not help your symptoms, procedures can be done to shrink or remove the hemorrhoids. What are the causes? This condition is caused by increased pressure in the anal area. This pressure may result from various things, including:  Constipation.  Straining to have a bowel movement.  Diarrhea.  Pregnancy.  Obesity.  Sitting for long periods of time.  Heavy lifting or other activity that causes you to strain.  Anal sex.  What are the signs or symptoms? Symptoms of this condition include:  Pain.  Anal itching or irritation.  Rectal bleeding.  Leakage of stool (feces).  Anal swelling.  One or more lumps around the anus.  How is this diagnosed? This condition can often be diagnosed through a visual exam. Other exams or tests may also be done, such as:  Examination of the rectal area with a gloved hand (digital rectal exam).  Examination of the anal canal using a small tube (anoscope).  A blood test, if you have lost a significant amount of blood.  A test to look inside the colon (sigmoidoscopy or colonoscopy).  How is this treated? This condition can usually be treated at home. However, various procedures may be done if dietary changes, lifestyle changes, and other home treatments do not help your symptoms. These procedures can help make the hemorrhoids smaller or remove them completely. Some of these procedures involve  surgery, and others do not. Common procedures include:  Rubber band ligation. Rubber bands are placed at the base of the hemorrhoids to cut off the blood supply to them.  Sclerotherapy. Medicine is injected into the hemorrhoids to shrink them.  Infrared coagulation. A type of light energy is used to get rid of the hemorrhoids.  Hemorrhoidectomy surgery. The hemorrhoids are surgically removed, and the veins that supply them are tied off.  Stapled hemorrhoidopexy surgery. A circular stapling device is used to remove the hemorrhoids and use staples to cut off the blood supply to them.  Follow these instructions at home: Eating and drinking  Eat foods that have a lot of fiber in them, such as whole grains, beans, nuts, fruits, and vegetables. Ask your health care provider about taking products that have added fiber (fiber supplements).  Drink enough fluid to keep your urine clear or pale yellow. Managing pain and swelling  Take warm sitz baths for 20 minutes, 3-4 times a day to ease pain and discomfort.  If directed, apply ice to the affected area. Using ice packs between sitz baths may be helpful. ? Put ice in a plastic bag. ? Place a towel between your skin and the bag. ? Leave the ice on for 20 minutes, 2-3 times a day. General instructions  Take over-the-counter and prescription medicines only as told by your health care provider.  Use medicated creams or suppositories as told.  Exercise regularly.  Go to the bathroom when you have the urge to have a bowel movement. Do not wait.    Avoid straining to have bowel movements.  Keep the anal area dry and clean. Use wet toilet paper or moist towelettes after a bowel movement.  Do not sit on the toilet for long periods of time. This increases blood pooling and pain. Contact a health care provider if:  You have increasing pain and swelling that are not controlled by treatment or medicine.  You have uncontrolled bleeding.  You  have difficulty having a bowel movement, or you are unable to have a bowel movement.  You have pain or inflammation outside the area of the hemorrhoids. This information is not intended to replace advice given to you by your health care provider. Make sure you discuss any questions you have with your health care provider. Document Released: 07/12/2000 Document Revised: 12/13/2015 Document Reviewed: 03/29/2015 Elsevier Interactive Patient Education  2018 [employer]  

## 2018-02-24 NOTE — Progress Notes (Signed)
Patient ID: [name], female    DOB: 01/25/1941  Age: 77 y.o. MRN: [id number]    Subjective:  Subjective  HPI Kemiah Booz presents for blood in stool.  She noticed bright red blood in toilet last night x2.  None this am --  She denies NVD.   No abd pain.  She has a hx of constipation and int hemorrhoids.  No gerd symptoms   Review of Systems  Constitutional: Negative for fever.  HENT: Negative for congestion.   Respiratory: Negative for shortness of breath.   Cardiovascular: Negative for chest pain, palpitations and leg swelling.  Gastrointestinal: Positive for anal bleeding, blood in stool and constipation. Negative for abdominal pain and nausea.  Genitourinary: Negative for dysuria and frequency.  Skin: Negative for rash.  Allergic/Immunologic: Negative for environmental allergies.  Neurological: Negative for dizziness and headaches.  Psychiatric/Behavioral: The patient is not nervous/anxious.     History Past Medical History:  Diagnosis Date  . Adenomatous colon polyp 03/2012  . 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 Cataract   . Chicken pox as a child  . CKD (chronic kidney disease), stage III ([place]) 02/21/2017  . DDD (degenerative disc disease), cervical   . EBV infection    mono as child  . Fibrocystic breast 05/01/2014  . GERD (gastroesophageal reflux disease)    pt denies  . Hyperlipidemia   . Hypertension 20 yrs ago  . Measles as a child  . Medicare annual wellness visit, subsequent 02/01/2015  . Mumps as a child  . Neck pain 01/22/2016  . Other and unspecified hyperlipidemia 10/13/2013  . Overweight(278.02)   . PONV (postoperative nausea and vomiting)    pt would like something to prevent nausea  . Vasovagal reaction 07/15/2015   no syncope but close     She has a past surgical history that includes Vaginal hysterectomy (77 yrs old); Eye surgery (11-27-2010); Cataract extraction, bilateral (Bilateral, 2012); Abdominal hysterectomy (29 yrs ago);  Colonoscopy; Polypectomy; and Anterior cervical decomp/discectomy fusion (N/A, 05/15/2017).   Her family history includes Cancer in her paternal grandfather; Dementia in her maternal grandmother and mother; Heart attack in her paternal grandfather; Heart attack (age of onset: 74) in her father; Heart disease in her father and maternal grandfather; Hyperlipidemia in her paternal grandmother; Hypertension in her father; Other in her son; Pulmonary embolism in her son; Pyruvate dehydrogenase deficiency in her son; Stomach cancer in her paternal grandfather; Vasculitis in her son.She reports that she has quit smoking. Her smoking use included cigarettes. She started smoking about 45 years ago. She has a 16.00 pack-year smoking history. She has never used smokeless tobacco. She reports that she does not drink alcohol or use drugs.  Current Outpatient Medications on File Prior to Visit  Medication Sig Dispense Refill  . acetaminophen (TYLENOL) 500 MG tablet Take 500 mg by mouth 2 (two) times daily as needed for mild pain.     . cholecalciferol (VITAMIN D) 1000 units tablet Take 1,000 Units by mouth daily.    . hydrochlorothiazide (MICROZIDE) 12.5 MG capsule TAKE 1 CAPSULE BY MOUTH EVERY DAY 90 capsule 0  . losartan (COZAAR) 50 MG tablet TAKE 1 TABLET BY MOUTH EVERY DAY 90 tablet 0  . naproxen (NAPROSYN) 500 MG tablet Take 500 mg by mouth at bedtime.   1  . tiZANidine (ZANAFLEX) 4 MG tablet Take 1 tablet (4 mg total) by mouth every 6 (six) hours as needed for muscle spasms. 60 tablet 0  No current facility-administered medications on file prior to visit.      Objective:  Objective  Physical Exam  Constitutional: She is oriented to person, place, and time. She appears well-developed and well-nourished.  HENT:  Head: Normocephalic and atraumatic.  Eyes: Conjunctivae and EOM are normal.  Neck: Normal range of motion. Neck supple. No JVD present. Carotid bruit is not present. No thyromegaly present.    Cardiovascular: Normal rate, regular rhythm and normal heart sounds.  No murmur heard. Pulmonary/Chest: Effort normal and breath sounds normal. No respiratory distress. She has no wheezes. She has no rales. She exhibits no tenderness.  Abdominal: Soft. She exhibits no distension and no mass. There is no tenderness. There is no rebound and no guarding. No hernia.    Genitourinary: Rectal exam shows guaiac positive stool.  Musculoskeletal: She exhibits no edema.  Neurological: She is alert and oriented to person, place, and time.  Psychiatric: She has a normal mood and affect.  Nursing note and vitals reviewed.  BP (!) 116/57 (BP Location: Left Arm, Patient Position: Sitting, Cuff Size: Large)   Pulse 85   Temp 98.5 F (36.9 C) (Oral)   Ht 5\' 4"  (1.626 m)   SpO2 100%   BMI 28.49 kg/m  Wt Readings from Last 3 Encounters:  02/12/18 166 lb (75.3 kg)  09/14/17 165 lb (74.8 kg)  08/07/17 165 lb 12.8 oz (75.2 kg)     Lab Results  Component Value Date   WBC 5.9 02/12/2018   HGB 13.7 02/12/2018   HCT 41.4 02/12/2018   PLT 258.0 02/12/2018   GLUCOSE 92 02/12/2018   CHOL 226 (H) 02/12/2018   TRIG 167.0 (H) 02/12/2018   HDL 53.60 02/12/2018   LDLCALC 139 (H) 02/12/2018   ALT 11 02/12/2018   AST 14 02/12/2018   NA 137 02/12/2018   K 4.1 02/12/2018   CL 101 02/12/2018   CREATININE 0.94 02/12/2018   BUN 19 02/12/2018   CO2 31 02/12/2018   TSH 2.73 02/12/2018    No results found.   Assessment & Plan:  Plan  I have discontinued Lelan Pons Mcgrory's losartan-hydrochlorothiazide and hydrOXYzine. I am also having her start on hydrocortisone. Additionally, I am having her maintain her naproxen, cholecalciferol, acetaminophen, tiZANidine, losartan, and hydrochlorothiazide.  Meds ordered this encounter  Medications  . hydrocortisone (ANUSOL-HC) 25 MG suppository    Sig: Place 1 suppository (25 mg total) rectally 2 (two) times daily.    Dispense:  24 suppository    Refill:  0     Problem List Items Addressed This Visit    None    Visit Diagnoses    Blood in stool    -  Primary   Relevant Orders   POC Hemoccult Bld/Stl (1-Cd Office Dx) (Completed)   CBC with Differential/Platelet   Ambulatory referral to Gastroenterology   Internal hemorrhoid       Relevant Medications   hydrocortisone (ANUSOL-HC) 25 MG suppository      Follow-up: Return if symptoms worsen or fail to improve.  Ann Held, DO

## 2018-02-24 NOTE — Telephone Encounter (Signed)
Patient reports seen by Dr. Etter Sjogren- Cheri Rous; prescribed hydrocortisone (ANUSOL-HC) 25 MG suppository. She states too expensive. The pharmacist gave her an over the counter [facility] brand which she purchased. Questioning if  Dr. Etter Sjogren- Cheri Rous can call  in something less expensive.    [id number] [place],  - [address] AT [place]      [phone] (Phone) [phone] (Fax)

## 2018-02-24 NOTE — Telephone Encounter (Signed)
Copied from [place] [id number]. Topic: Quick Communication - See Telephone Encounter >> Feb 24, 2018 10:24 AM Mylinda Latina, NT wrote: CRM for notification. See Telephone encounter for: 02/24/18. Patient called and states that she seen Dr. Etter Sjogren- Cheri Rous and she prescribed hydrocortisone (ANUSOL-HC) 25 MG suppository. She states that medicine is $300. The pharmacist gave her an over the counter suppository.( which is a [facility] brand ) .She already brought the over the counter, but can Dr. Etter Sjogren- [place]  called her in something that is cheaper.    [id number] - [place], [place] - [address] AT [place] [phone] (Phone) [phone] (Fax)

## 2018-02-25 ENCOUNTER — Encounter: Payer: Self-pay | Admitting: Gastroenterology

## 2018-02-25 ENCOUNTER — Ambulatory Visit (INDEPENDENT_AMBULATORY_CARE_PROVIDER_SITE_OTHER): Payer: Medicare Other | Admitting: Gastroenterology

## 2018-02-25 VITALS — BP 134/80 | HR 80 | Ht 64.0 in

## 2018-02-25 DIAGNOSIS — Z86010 Personal history of colonic polyps: Secondary | ICD-10-CM

## 2018-02-25 DIAGNOSIS — K921 Melena: Secondary | ICD-10-CM

## 2018-02-25 DIAGNOSIS — K5900 Constipation, unspecified: Secondary | ICD-10-CM | POA: Diagnosis not present

## 2018-02-25 DIAGNOSIS — R195 Other fecal abnormalities: Secondary | ICD-10-CM

## 2018-02-25 NOTE — Patient Instructions (Signed)
Please coat your suppositories with 1 % hydrocortisone cream and use twice daily x 5 days.   Increase your water intake to 6-8 glasses daily.   Eat prunes or drink prune juice daily.   Call our office back if your symptoms are no better.   Thank you for choosing me and [facility].  Pricilla Riffle. Dagoberto Ligas., MD., Marval Regal

## 2018-02-25 NOTE — Progress Notes (Signed)
    History of Present Illness: This is a 77 year old female with a few episodes of small-volume hematochezia and Hemoccult positive stool was noted on DRE.  She was evaluated yesterday by Dr. Etter Sjogren for self-limited episodes of hematochezia associated with constipation and straining.  She has a history of constipation and internal hemorrhoids. DRE showed no lesions and heme + stool.   Colonoscopy 08/2015:  1. Normal colonoscopy 2. Grade I internal hemorrhoids  Current Medications, Allergies, Past Medical History, Past Surgical History, Family History and Social History were reviewed in [employer] record.  Physical Exam: General: Well developed, well nourished, no acute distress Head: Normocephalic and atraumatic Eyes:  sclerae anicteric, EOMI Ears: Normal auditory acuity Mouth: No deformity or lesions Lungs: Clear throughout to auscultation Heart: Regular rate and rhythm; no murmurs, rubs or bruits Abdomen: Soft, non tender and non distended. No masses, hepatosplenomegaly or hernias noted. Normal Bowel sounds Rectal: deferred as she had DRE yesterday by Dr Etter Sjogren  Musculoskeletal: Symmetrical with no gross deformities  Pulses:  Normal pulses noted Extremities: No clubbing, cyanosis, edema or deformities noted Neurological: Alert oriented x 4, grossly nonfocal Psychological:  Alert and cooperative. Normal mood and affect  Assessment and Recommendations:  1.  Self-limited small-volume hematochezia, Hemoccult positive stool - due to internal hemorrhoids.  She did not fill Anusol HC due to the high cost. Begin Prep H supp coated with 1% HC cream PR bid for 5 days then use prn.   2.  Constipation. Prunes and/or prune juice daily with at least six 8 oz glasses of water each day. LOC daily as needed.   3. Personal history of adenomatous colon polyps.  5-year interval surveillance colonoscopy is recommended in February 2022.

## 2018-02-26 ENCOUNTER — Other Ambulatory Visit: Payer: Self-pay | Admitting: Family Medicine

## 2018-02-26 DIAGNOSIS — K649 Unspecified hemorrhoids: Secondary | ICD-10-CM

## 2018-02-26 MED ORDER — HYDROCORTISONE ACE-PRAMOXINE 1-1 % RE FOAM: 1.0000 | Freq: Two times a day (BID) | RECTAL | 0 refills | Status: DC

## 2018-02-26 NOTE — Telephone Encounter (Signed)
Proctofoam sent in

## 2018-03-18 ENCOUNTER — Encounter: Payer: Self-pay | Admitting: Physical Therapy

## 2018-03-18 ENCOUNTER — Other Ambulatory Visit: Payer: Self-pay

## 2018-03-18 ENCOUNTER — Ambulatory Visit: Payer: Medicare Other | Attending: Physical Medicine and Rehabilitation | Admitting: Physical Therapy

## 2018-03-18 DIAGNOSIS — M542 Cervicalgia: Secondary | ICD-10-CM | POA: Diagnosis not present

## 2018-03-18 DIAGNOSIS — R29898 Other symptoms and signs involving the musculoskeletal system: Secondary | ICD-10-CM | POA: Diagnosis not present

## 2018-03-18 DIAGNOSIS — R293 Abnormal posture: Secondary | ICD-10-CM

## 2018-03-18 NOTE — Therapy (Signed)
[facility] [address]  [place], [place], [address] Phone: [phone]   Fax:  [phone]  Physical Therapy Evaluation  Patient Details  Name: Karynn Deblasi MRN: [id number] Date of Birth: [birth year] Referring Provider: Laroy Apple, DO   Encounter Date: 03/18/2018    Past Medical History:  Diagnosis Date  . Adenomatous colon polyp 03/2012  . 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 Cataract   . Chicken pox as a child  . CKD (chronic kidney disease), stage III ([place]) 02/21/2017  . DDD (degenerative disc disease), cervical   . EBV infection    mono as child  . Fibrocystic breast 05/01/2014  . GERD (gastroesophageal reflux disease)    pt denies  . Hyperlipidemia   . Hypertension 20 yrs ago  . Measles as a child  . Medicare annual wellness visit, subsequent 02/01/2015  . Mumps as a child  . Neck pain 01/22/2016  . Other and unspecified hyperlipidemia 10/13/2013  . Overweight(278.02)   . PONV (postoperative nausea and vomiting)    pt would like something to prevent nausea  . Vasovagal reaction 07/15/2015   no syncope but close     Past Surgical History:  Procedure Laterality Date  . ABDOMINAL HYSTERECTOMY  29 yrs ago   heavy bleeding, ovaries left in place  . ANTERIOR CERVICAL DECOMP/DISCECTOMY FUSION N/A 05/15/2017   Procedure: ANTERIOR CERVICAL DECOMPRESSION/DISCECTOMY FUSION CERVICL THREE CERVICAL FOUR, CERVICAL FOUR- CERVICAL FIVE;  Surgeon: Ashok Pall, MD;  Location: [place];  Service: Neurosurgery;  Laterality: N/A;  . 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 COLONOSCOPY    . EYE SURGERY  11-27-2010   cataract surgery in both eyes  . POLYPECTOMY    . VAGINAL HYSTERECTOMY  77 yrs old   uterus removed    There were no vitals filed for this visit.   Subjective Assessment - 03/18/18 0849    Subjective  Patient reports 4 year hx of chronic L sided neck  pain and occipital neuralgia. Patient also had C3-5 fusion in 2018 due to DDD and bone spurs. Current symptoms include tightness and pain along L side of posterior neck, L shoulder sitting lower than the other, R clavicle sticking out. Aggravating factors include returning from cervical flexion, washing floor, vacuuming. Reports occipital neuralgia symptoms include sharp pain "like sciatica" that comes and goes, worse with stress. Denies N/T.  Gets injections with temporary relief.    Pertinent History  vasovagal reaction, HLD, neck pain, HTN, GERD, cervical DDD, CKD III, basal cell cancer, anterior cervical decompression/discectomy & fusion C3-5    Limitations  House hold activities;Reading;Lifting    Diagnostic tests  cervical xray 08/26/17: Anterior cervical fusion hardware appears intact and stable in alignment at the C3 through C5 levels. No evidence of surgical complicating feature.    Patient Stated Goals  i dont want pain; be able to get off naproxen    Currently in Pain?  Yes    Pain Score  5     Pain Location  Neck    Pain Orientation  Left;Posterior    Pain Descriptors / Indicators  Tightness    Pain Type  Chronic pain    Aggravating Factors   sleeping on R side, vacuuming, cervical extension    Pain Relieving Factors  sleeping on L side         [facility] PT Assessment - 03/18/18 0001  Assessment   Medical Diagnosis  Cervical Spondylosis    Referring Provider  Laroy Apple, DO    Onset Date/Surgical Date  --   4 years ago   Hand Dominance  Right;Left    Prior Therapy  Yes- for neck pain      Precautions   Precautions  None      Restrictions   Weight Bearing Restrictions  No      Balance Screen   Has the patient fallen in the past 6 months  No    Has the patient had a decrease in activity level because of a fear of falling?   No    Is the patient reluctant to leave their home because of a fear of falling?   No      Home Film/video editor  residence    Living Arrangements  Spouse/significant other    Type of [place]      Prior Function   Level of [place]  Retired    Leisure  reading, cooking      Cognition   Overall Cognitive Status  Within Functional Limits for tasks assessed      Observation/Other Assessments   Focus on Therapeutic Outcomes (FOTO)   Neck: 48 (52% limited, 46% predicted)      Sensation   Light Touch  Appears Intact      Coordination   Gross Motor Movements are Fluid and Coordinated  Yes      Posture/Postural Control   Posture/Postural Control  Postural limitations    Postural Limitations  Rounded Shoulders;Forward head   depression L shoulder, head tilted L, inc cervical lordosis     ROM / Strength   AROM / PROM / Strength  AROM;Strength      AROM   AROM Assessment Site  Cervical    Cervical Flexion  45   4/10 pain L    Cervical Extension  33   1/10 pain L   Cervical - Right Side Bend  20    Cervical - Left Side Bend  10   5-6/10 pain L   Cervical - Right Rotation  moderately impaired    5/10 pain L   Cervical - Left Rotation  severely impaired   5/10 pain L     Strength   Strength Assessment Site  Shoulder    Right/Left Shoulder  Right;Left    Right Shoulder Flexion  4+/5    Right Shoulder ABduction  4/5    Right Shoulder Internal Rotation  4+/5    Right Shoulder External Rotation  4/5    Left Shoulder Flexion  4+/5    Left Shoulder ABduction  4+/5    Left Shoulder Internal Rotation  4+/5    Left Shoulder External Rotation  4/5      Palpation   Palpation comment  TTP and soft tissue resctriction in B UT, LS, cervical paraspinals, suboccipitals, L>R                Objective measurements completed on examination: See above findings.              PT Education - 03/18/18 1234    Education provided  Yes    Education Details  prognosis, POC, HEP    Person(s) Educated  Patient    Methods  Explanation;Demonstration;Tactile  cues;Verbal cues;Handout    Comprehension  Returned demonstration;Verbalized understanding       PT Short Term  Goals - 03/18/18 1242      PT SHORT TERM GOAL #1   Title  Patient to be independent with initial HEP.    Time  3    Period  Weeks    Status  New    Target Date  04/08/18        PT Long Term Goals - 03/18/18 1243      PT LONG TERM GOAL #1   Title  Patient to be independent with advanced HEP.    Time  6    Period  Weeks    Status  New    Target Date  04/29/18      PT LONG TERM GOAL #2   Title  Patient to improve cervical AROM symmetrical to B sides and without pain limiting.     Time  6    Period  Weeks    Status  New    Target Date  04/29/18      PT LONG TERM GOAL #3   Title  Patient to demonstrate good postural awareness with ability to self-correct.    Time  6    Period  Weeks    Status  New    Target Date  04/29/18      PT LONG TERM GOAL #4   Title  Patient to report ability to perform vacuuming without neck pain limiting function.    Time  6    Period  Weeks    Status  New    Target Date  04/29/18             Plan - 03/18/18 1241    Clinical Impression Statement  Patient is a 76y/o F with hx of C3-5 fusion in 2018 presenting to OPPT with c/o L sided neck pain. Reports feeling of tightness and pain along L side of posterior neck with shooting nerve-like pain as she reports she has occipital neuralgia. Has had temporary relief from injections. Patient reports aggravating factors as returning from cervical flexion, washing floor, vacuuming. Patient today with significantly limited and painful cervical AROM, postural abnormalities, slightly decreased shoulder strength, and considerable soft tissue restriction and tenderness in B UT, LS, cervical paraspinals, suboccipitals, L>R. Patient educated on and received handout for gentle stretching and strengthening HEP. Advised not to push into painful ranges of motion. Patient reported understanding. Would  benefit from skilled PT services 2x/week for 6 weeks to address aforementioned impairments.     Clinical Presentation  Stable    Clinical Decision Making  Low    Rehab Potential  Good    PT Frequency  2x / week    PT Duration  6 weeks    PT Treatment/Interventions  ADLs/Self Care Home Management;Cryotherapy;Electrical Stimulation;Iontophoresis 4mg /ml Dexamethasone;Moist Heat;Traction;Ultrasound;Neuromuscular re-education;Therapeutic exercise;Therapeutic activities;Patient/family education;Manual techniques;Passive range of motion;Vasopneumatic Device;Taping;Dry needling;Splinting;Energy conservation;Scar mobilization;Functional mobility training    PT Next Visit Plan  reassess HEP    Consulted and Agree with Plan of Care  Patient       Patient will benefit from skilled therapeutic intervention in order to improve the following deficits and impairments:  Pain, Increased fascial restricitons, Impaired UE functional use, Decreased activity tolerance, Decreased range of motion, Decreased strength, Postural dysfunction, Impaired flexibility  Visit Diagnosis: Cervicalgia  Other symptoms and signs involving the musculoskeletal system  Abnormal posture     Problem List Patient Active Problem List   Diagnosis Date Noted  . Occipital neuralgia 08/07/2017  . Constipation 08/07/2017  . 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  Left foot pain 05/01/2017  . Obesity (BMI 30-39.9) 02/21/2017  . CKD (chronic kidney disease), stage III ([place]) 02/21/2017  . Chest pain 02/20/2017  . Hypertension 02/20/2017  . HLD (hyperlipidemia) 02/20/2017  . Neck pain 01/22/2016  . History of colonic polyps 07/15/2015  . Preventative health care 02/01/2015  . Fibrocystic breast 05/01/2014  . 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 Decreased visual acuity 01/23/2014  . Sun-damaged skin 01/23/2014  . Hot flashes 01/23/2014  . Recurrent BCC (basal cell carcinoma) 10/13/2013  . Cancer ([place])   . Cataract       Janene Harvey, PT, DPT 03/18/18 12:46 PM   [facility] [address]  [place], [place], [address] Phone: [phone]   Fax:  [phone]  Name: Breianna Delfino MRN: [id number] Date of Birth: 1940/11/21

## 2018-03-18 NOTE — Patient Instructions (Addendum)

## 2018-03-25 ENCOUNTER — Telehealth: Payer: Self-pay | Admitting: Physical Therapy

## 2018-03-25 ENCOUNTER — Ambulatory Visit: Payer: Medicare Other | Admitting: Physical Therapy

## 2018-03-25 NOTE — Telephone Encounter (Signed)
Called patient as she has cancelled the past 2 sessions with complaint that HEP exercises "are killing me." Spoke with patient about avoiding pain this HEP as this was advised at last session due to hx of cervical fusion. Offered patient modalities at next session to decrease pain after flare up. Patient reported she would call back to schedule appointment.   Janene Harvey, PT, DPT 03/25/18 11:36 AM

## 2018-03-26 ENCOUNTER — Ambulatory Visit: Payer: Medicare Other | Admitting: Physical Therapy

## 2018-04-01 ENCOUNTER — Ambulatory Visit: Payer: Medicare Other | Attending: Physical Medicine and Rehabilitation | Admitting: Physical Therapy

## 2018-04-01 DIAGNOSIS — R29898 Other symptoms and signs involving the musculoskeletal system: Secondary | ICD-10-CM | POA: Diagnosis not present

## 2018-04-01 DIAGNOSIS — M542 Cervicalgia: Secondary | ICD-10-CM | POA: Diagnosis not present

## 2018-04-01 DIAGNOSIS — R293 Abnormal posture: Secondary | ICD-10-CM | POA: Diagnosis not present

## 2018-04-01 NOTE — Therapy (Signed)
[facility] [address]  [place], [place], [address] Phone: [phone]   Fax:  [phone]  Physical Therapy Treatment  Patient Details  Name: Laura [name] MRN: [id number] Date of Birth: June 28, 1941 Referring Provider: Laroy Apple, DO   Encounter Date: 04/01/2018  PT End of Session - 04/01/18 1147    Visit Number  2    Number of Visits  13    Date for PT Re-Evaluation  04/29/18    Authorization Type  Medicare    PT Start Time  1100    PT Stop Time  1141    PT Time Calculation (min)  41 min    Activity Tolerance  Patient tolerated treatment well    Behavior During Therapy  [facility] for tasks assessed/performed       Past Medical History:  Diagnosis Date  . Adenomatous colon polyp 03/2012  . Cancer ([place])    basal on back and nose  . Cataract   . Chicken pox as a child  . CKD (chronic kidney disease), stage III ([place]) 02/21/2017  . DDD (degenerative disc disease), cervical   . EBV infection    mono as child  . Fibrocystic breast 05/01/2014  . GERD (gastroesophageal reflux disease)    pt denies  . Hyperlipidemia   . Hypertension 20 yrs ago  . Measles as a child  . Medicare annual wellness visit, subsequent 02/01/2015  . Mumps as a child  . Neck pain 01/22/2016  . Other and unspecified hyperlipidemia 10/13/2013  . Overweight(278.02)   . PONV (postoperative nausea and vomiting)    pt would like something to prevent nausea  . Vasovagal reaction 07/15/2015   no syncope but close     Past Surgical History:  Procedure Laterality Date  . ABDOMINAL HYSTERECTOMY  29 yrs ago   heavy bleeding, ovaries left in place  . ANTERIOR CERVICAL DECOMP/DISCECTOMY FUSION N/A 05/15/2017   Procedure: ANTERIOR CERVICAL DECOMPRESSION/DISCECTOMY FUSION CERVICL THREE CERVICAL FOUR, CERVICAL FOUR- CERVICAL FIVE;  Surgeon: Ashok Pall, MD;  Location: [facility];  Service: Neurosurgery;  Laterality: N/A;  . CATARACT EXTRACTION,  BILATERAL Bilateral 2012   c/o blepharospasms since then  . COLONOSCOPY    . EYE SURGERY  11-27-2010   cataract surgery in both eyes  . POLYPECTOMY    . VAGINAL HYSTERECTOMY  77 yrs old   uterus removed    There were no vitals filed for this visit.  Subjective Assessment - 04/01/18 1103    Subjective  Patient reports that she had increase pain after last session.  Tried doing HEP one time and said that his caused her severe pain. Had pain for about a week and has been getting better. Today woke up with occipital neuralgia pain.     Pertinent History  vasovagal reaction, HLD, neck pain, HTN, GERD, cervical DDD, CKD III, basal cell cancer, anterior cervical decompression/discectomy & fusion C3-5    Diagnostic tests  cervical xray 08/26/17: Anterior cervical fusion hardware appears intact and stable in alignment at the C3 through C5 levels. No evidence of surgical complicating feature.    Patient Stated Goals  i dont want pain; be able to get off naproxen    Currently in Pain?  Yes    Pain Score  5     Pain Location  Neck    Pain Orientation  Left;Posterior    Pain Descriptors / Indicators  Sharp    Pain Type  Chronic pain  [facility] Adult PT Treatment/Exercise - 04/01/18 0001      Modalities   Modalities  Electrical Stimulation;Moist Heat      Moist Heat Therapy   Number Minutes Moist Heat  10 Minutes    Moist Heat Location  Cervical      Electrical Stimulation   Electrical Stimulation Location  Surrounding L UT    Electrical Stimulation Action  IFC    Electrical Stimulation Parameters  80-150 hz; Output: 9 to tolerance; 10 min    Electrical Stimulation Goals  Pain      Manual Therapy   Manual Therapy  Soft tissue mobilization;Myofascial release    Manual therapy comments  sitting    Soft tissue mobilization  L UT, LS, cervical parapsinals and suboccipitals- tenderness and soft tissue restriction in these areas   to tolerance   Myofascial  Release  manual trigger point release to L LS and UT   to tolerance            PT Education - 04/01/18 1144    Education provided  Yes    Education Details  Educated patient on long term benefits of exercise and posture correction as well as expected ROM after cervical fusion    Person(s) Educated  Patient    Methods  Explanation    Comprehension  Verbalized understanding       PT Short Term Goals - 04/01/18 1149      PT SHORT TERM GOAL #1   Title  Patient to be independent with initial HEP.    Time  3    Period  Weeks    Status  On-going        PT Long Term Goals - 03/18/18 1243      PT LONG TERM GOAL #1   Title  Patient to be independent with advanced HEP.    Time  6    Period  Weeks    Status  New    Target Date  04/29/18      PT LONG TERM GOAL #2   Title  Patient to improve cervical AROM symmetrical to B sides and without pain limiting.     Time  6    Period  Weeks    Status  New    Target Date  04/29/18      PT LONG TERM GOAL #3   Title  Patient to demonstrate good postural awareness with ability to self-correct.    Time  6    Period  Weeks    Status  New    Target Date  04/29/18      PT LONG TERM GOAL #4   Title  Patient to report ability to perform vacuuming without neck pain limiting function.    Time  6    Period  Weeks    Status  New    Target Date  04/29/18            Plan - 04/01/18 1148    Clinical Impression Statement  Patient arrived to session with report of increased neck pain after initial eval. Reports trying HEP exercises one time and discontinued all exercises after that. Patient refused to perform all exercises today, reporting "I don't want to do any of that crap." Educated patient on long term benefits of exercise and posture correction as well as expected ROM after cervical fusion, however patient still apprehensive about ther-ex. Patient reported previous short term benefit from [facility] and e-stim. Patient tolerated STM to L  UT,  LS, cervical parapspinals and suboccipitals- tenderness and soft tissue restriction in these areas. Received moist heat and e-stim to cervical spine at end of session. Normal integumentary response observed and patient reporting mild relief of symptoms. Patient with question about ice vs. heat for pain relief at home. Advised patient to use hot pack at home as needed for pain relief. Patient agreeable.     PT Treatment/Interventions  ADLs/Self Care Home Management;Cryotherapy;Electrical Stimulation;Iontophoresis 4mg /ml Dexamethasone;Moist Heat;Traction;Ultrasound;Neuromuscular re-education;Therapeutic exercise;Therapeutic activities;Patient/family education;Manual techniques;Passive range of motion;Vasopneumatic Device;Taping;Dry needling;Splinting;Energy conservation;Scar mobilization;Functional mobility training    Consulted and Agree with Plan of Care  Patient       Patient will benefit from skilled therapeutic intervention in order to improve the following deficits and impairments:  Pain, Increased fascial restricitons, Impaired UE functional use, Decreased activity tolerance, Decreased range of motion, Decreased strength, Postural dysfunction, Impaired flexibility  Visit Diagnosis: Cervicalgia  Other symptoms and signs involving the musculoskeletal system  Abnormal posture     Problem List Patient Active Problem List   Diagnosis Date Noted  . Occipital neuralgia 08/07/2017  . Constipation 08/07/2017  . 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 Left foot pain 05/01/2017  . Obesity (BMI 30-39.9) 02/21/2017  . CKD (chronic kidney disease), stage III ([place]) 02/21/2017  . Chest pain 02/20/2017  . Hypertension 02/20/2017  . HLD (hyperlipidemia) 02/20/2017  . Neck pain 01/22/2016  . History of colonic polyps 07/15/2015  . Preventative health care 02/01/2015  . Fibrocystic breast 05/01/2014  . 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 Decreased visual acuity 01/23/2014  . Sun-damaged skin  01/23/2014  . Hot flashes 01/23/2014  . Recurrent BCC (basal cell carcinoma) 10/13/2013  . Cancer ([place])   . Cataract      Janene Harvey, PT, DPT 04/01/18 11:51 AM   [facility] [address]  [place], [place], [address] Phone: [phone]   Fax:  [phone]  Name: Laura [name] MRN: [id number] Date of Birth: June 11, 1941

## 2018-04-06 ENCOUNTER — Encounter: Payer: Medicare Other | Admitting: Physical Therapy

## 2018-04-09 ENCOUNTER — Ambulatory Visit: Payer: Medicare Other | Admitting: Physical Therapy

## 2018-04-09 ENCOUNTER — Encounter: Payer: Medicare Other | Admitting: Physical Therapy

## 2018-04-09 ENCOUNTER — Encounter: Payer: Self-pay | Admitting: Physical Therapy

## 2018-04-09 DIAGNOSIS — R293 Abnormal posture: Secondary | ICD-10-CM

## 2018-04-09 DIAGNOSIS — R29898 Other symptoms and signs involving the musculoskeletal system: Secondary | ICD-10-CM

## 2018-04-09 DIAGNOSIS — M542 Cervicalgia: Secondary | ICD-10-CM | POA: Diagnosis not present

## 2018-04-09 NOTE — Patient Instructions (Signed)

## 2018-04-09 NOTE — Therapy (Signed)
[facility] [address]  [place], [place], [address] Phone: [phone]   Fax:  [phone]  Physical Therapy Treatment  Patient Details  Name: Laura [name] MRN: [id number] Date of Birth: 01-14-1941 Referring Provider: Laroy Apple, DO   Encounter Date: 04/09/2018  PT End of Session - 04/09/18 1135    Visit Number  3    Number of Visits  13    Date for PT Re-Evaluation  04/29/18    Authorization Type  Medicare    PT Start Time  1059    PT Stop Time  1144    PT Time Calculation (min)  45 min    Activity Tolerance  Patient tolerated treatment well    Behavior During Therapy  [facility] for tasks assessed/performed       Past Medical History:  Diagnosis Date  . Adenomatous colon polyp 03/2012  . Cancer ([place])    basal on back and nose  . Cataract   . Chicken pox as a child  . CKD (chronic kidney disease), stage III ([place]) 02/21/2017  . DDD (degenerative disc disease), cervical   . EBV infection    mono as child  . Fibrocystic breast 05/01/2014  . GERD (gastroesophageal reflux disease)    pt denies  . Hyperlipidemia   . Hypertension 20 yrs ago  . Measles as a child  . Medicare annual wellness visit, subsequent 02/01/2015  . Mumps as a child  . Neck pain 01/22/2016  . Other and unspecified hyperlipidemia 10/13/2013  . Overweight(278.02)   . PONV (postoperative nausea and vomiting)    pt would like something to prevent nausea  . Vasovagal reaction 07/15/2015   no syncope but close     Past Surgical History:  Procedure Laterality Date  . ABDOMINAL HYSTERECTOMY  29 yrs ago   heavy bleeding, ovaries left in place  . ANTERIOR CERVICAL DECOMP/DISCECTOMY FUSION N/A 05/15/2017   Procedure: ANTERIOR CERVICAL DECOMPRESSION/DISCECTOMY FUSION CERVICL THREE CERVICAL FOUR, CERVICAL FOUR- CERVICAL FIVE;  Surgeon: Ashok Pall, MD;  Location: [facility];  Service: Neurosurgery;  Laterality: N/A;  . CATARACT EXTRACTION,  BILATERAL Bilateral 2012   c/o blepharospasms since then  . COLONOSCOPY    . EYE SURGERY  11-27-2010   cataract surgery in both eyes  . POLYPECTOMY    . VAGINAL HYSTERECTOMY  77 yrs old   uterus removed    There were no vitals filed for this visit.  Subjective Assessment - 04/09/18 1059    Subjective  Reports she had good benefit from last session. Had a good week until today because she didnt sleep well last night. Reports she did not try heat since she was feeling good. Would like to continue with same treatment from last session. Not open to performing therapeutic exercise.    Pertinent History  vasovagal reaction, HLD, neck pain, HTN, GERD, cervical DDD, CKD III, basal cell cancer, anterior cervical decompression/discectomy & fusion C3-5    Diagnostic tests  cervical xray 08/26/17: Anterior cervical fusion hardware appears intact and stable in alignment at the C3 through C5 levels. No evidence of surgical complicating feature.    Patient Stated Goals  i dont want pain; be able to get off naproxen    Currently in Pain?  Yes    Pain Score  6     Pain Orientation  Left;Posterior    Pain Descriptors / Indicators  Sharp    Pain Type  Chronic pain  [facility] Adult PT Treatment/Exercise - 04/09/18 0001      Moist Heat Therapy   Number Minutes Moist Heat  15 Minutes    Moist Heat Location  Cervical      Electrical Stimulation   Electrical Stimulation Location  Surrounding L UT    Electrical Stimulation Action  IFC    Electrical Stimulation Parameters  80-150hz ; output 7 to tolerance; 15 min    Electrical Stimulation Goals  Pain      Manual Therapy   Manual Therapy  Soft tissue mobilization;Myofascial release    Manual therapy comments  sitting    Soft tissue mobilization  L UT, LS, rhomboid, cervical parapsinals and suboccipitals- tenderness and soft tissue restriction in these areas   improved soft tissue restriction in L UT today    Myofascial  Release  manual trigger point release to L LS and UT             PT Education - 04/09/18 1135    Education provided  Yes    Education Details  EDU on personal TENS unit    Person(s) Educated  Patient    Methods  Explanation;Demonstration;Tactile cues;Verbal cues;Handout    Comprehension  Verbalized understanding;Returned demonstration       PT Short Term Goals - 04/01/18 1149      PT SHORT TERM GOAL #1   Title  Patient to be independent with initial HEP.    Time  3    Period  Weeks    Status  On-going        PT Long Term Goals - 04/09/18 1141      PT LONG TERM GOAL #1   Title  Patient to be independent with advanced HEP.    Time  6    Period  Weeks    Status  On-going      PT LONG TERM GOAL #2   Title  Patient to improve cervical AROM symmetrical to B sides and without pain limiting.     Time  6    Period  Weeks    Status  On-going      PT LONG TERM GOAL #3   Title  Patient to demonstrate good postural awareness with ability to self-correct.    Time  6    Period  Weeks    Status  On-going      PT LONG TERM GOAL #4   Title  Patient to report ability to perform vacuuming without neck pain limiting function.    Time  6    Period  Weeks    Status  On-going            Plan - 04/09/18 1136    Clinical Impression Statement  Patient arrived to session with report of improvement in L sided neck pain since last session, however today having exacerbation of pain from not sleeping well. Patient continues to decline therapeutic exercise, requesting manual therapy and e-stim for pain management. Patient tolerated STM and trigger point release to L UT, LS, rhomboid, cervical paraspinals, and suboccipitals. Improved soft tissue restriction in UT today. Ended session with moist heat and e-stim to posterior neck for pain management. Normal integumentary response observed. Educated patient on personal TENS unit for continued pain management. Patient reported improvement  in cervical ROM at end of session.    PT Treatment/Interventions  ADLs/Self Care Home Management;Cryotherapy;Electrical Stimulation;Iontophoresis 4mg /ml Dexamethasone;Moist Heat;Traction;Ultrasound;Neuromuscular re-education;Therapeutic exercise;Therapeutic activities;Patient/family education;Manual techniques;Passive range of motion;Vasopneumatic Device;Taping;Dry needling;Splinting;Energy conservation;Scar mobilization;Functional mobility training  Consulted and Agree with Plan of Care  Patient       Patient will benefit from skilled therapeutic intervention in order to improve the following deficits and impairments:  Pain, Increased fascial restricitons, Impaired UE functional use, Decreased activity tolerance, Decreased range of motion, Decreased strength, Postural dysfunction, Impaired flexibility  Visit Diagnosis: Cervicalgia  Other symptoms and signs involving the musculoskeletal system  Abnormal posture     Problem List Patient Active Problem List   Diagnosis Date Noted  . Occipital neuralgia 08/07/2017  . Constipation 08/07/2017  . 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 Left foot pain 05/01/2017  . Obesity (BMI 30-39.9) 02/21/2017  . CKD (chronic kidney disease), stage III ([place]) 02/21/2017  . Chest pain 02/20/2017  . Hypertension 02/20/2017  . HLD (hyperlipidemia) 02/20/2017  . Neck pain 01/22/2016  . History of colonic polyps 07/15/2015  . Preventative health care 02/01/2015  . Fibrocystic breast 05/01/2014  . 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 Decreased visual acuity 01/23/2014  . Sun-damaged skin 01/23/2014  . Hot flashes 01/23/2014  . Recurrent BCC (basal cell carcinoma) 10/13/2013  . Cancer ([place])   . Cataract      Janene Harvey, PT, DPT 04/09/18 11:48 AM   [facility] [address]  [place], [place], [address] Phone: [phone]   Fax:  [phone]  Name: Laura [name] MRN:  [id number] Date of Birth: 1941/01/03

## 2018-04-13 ENCOUNTER — Encounter: Payer: Medicare Other | Admitting: Physical Therapy

## 2018-04-14 ENCOUNTER — Encounter: Payer: Self-pay | Admitting: Physical Therapy

## 2018-04-14 ENCOUNTER — Ambulatory Visit: Payer: Medicare Other | Admitting: Physical Therapy

## 2018-04-14 DIAGNOSIS — M542 Cervicalgia: Secondary | ICD-10-CM | POA: Diagnosis not present

## 2018-04-14 DIAGNOSIS — R29898 Other symptoms and signs involving the musculoskeletal system: Secondary | ICD-10-CM

## 2018-04-14 DIAGNOSIS — R293 Abnormal posture: Secondary | ICD-10-CM

## 2018-04-14 NOTE — Therapy (Addendum)
[facility] [address]  [place], [place], [address] Phone: [phone]   Fax:  [phone]  Physical Therapy Treatment  Patient Details  Name: Laura [name] MRN: [id number] Date of Birth: 1941/04/03 Referring Provider: Laroy Apple, DO   Progress Note Reporting Period 03/18/18 to 04/14/18  See note below for Objective Data and Assessment of Progress/Goals.    Encounter Date: 04/14/2018  PT End of Session - 04/14/18 0916    Visit Number  4    Number of Visits  13    Date for PT Re-Evaluation  04/29/18    Authorization Type  Medicare    PT Start Time  0843    PT Stop Time  0928    PT Time Calculation (min)  45 min    Activity Tolerance  Patient tolerated treatment well;Patient limited by pain    Behavior During Therapy  [facility] for tasks assessed/performed       Past Medical History:  Diagnosis Date  . Adenomatous colon polyp 03/2012  . Cancer ([place])    basal on back and nose  . Cataract   . Chicken pox as a child  . CKD (chronic kidney disease), stage III ([place]) 02/21/2017  . DDD (degenerative disc disease), cervical   . EBV infection    mono as child  . Fibrocystic breast 05/01/2014  . GERD (gastroesophageal reflux disease)    pt denies  . Hyperlipidemia   . Hypertension 20 yrs ago  . Measles as a child  . Medicare annual wellness visit, subsequent 02/01/2015  . Mumps as a child  . Neck pain 01/22/2016  . Other and unspecified hyperlipidemia 10/13/2013  . Overweight(278.02)   . PONV (postoperative nausea and vomiting)    pt would like something to prevent nausea  . Vasovagal reaction 07/15/2015   no syncope but close     Past Surgical History:  Procedure Laterality Date  . ABDOMINAL HYSTERECTOMY  29 yrs ago   heavy bleeding, ovaries left in place  . ANTERIOR CERVICAL DECOMP/DISCECTOMY FUSION N/A 05/15/2017   Procedure: ANTERIOR CERVICAL DECOMPRESSION/DISCECTOMY FUSION CERVICL THREE CERVICAL  FOUR, CERVICAL FOUR- CERVICAL FIVE;  Surgeon: Ashok Pall, MD;  Location: [facility];  Service: Neurosurgery;  Laterality: N/A;  . 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 COLONOSCOPY    . EYE SURGERY  11-27-2010   cataract surgery in both eyes  . POLYPECTOMY    . VAGINAL HYSTERECTOMY  77 yrs old   uterus removed    There were no vitals filed for this visit.  Subjective Assessment - 04/14/18 0841    Subjective  Has been having a lot of pain this week. "I have not done anything just tried to rest. I have done a lot and that doesn't help." Thinks it is an arthritic flare up. Reports she did a lot of computer work and writing yesterday where she had to tilt her head forward.     Pertinent History  vasovagal reaction, HLD, neck pain, HTN, GERD, cervical DDD, CKD III, basal cell cancer, anterior cervical decompression/discectomy & fusion C3-5    Diagnostic tests  cervical xray 08/26/17: Anterior cervical fusion hardware appears intact and stable in alignment at the C3 through C5 levels. No evidence of surgical complicating feature.    Patient Stated Goals  i dont want pain; be able to get off naproxen    Currently in Pain?  Yes    Pain Score  9  Pain Location  Neck    Pain Orientation  Left;Posterior    Pain Descriptors / Indicators  Sharp    Pain Type  Chronic pain                       [facility] Adult PT Treatment/Exercise - 04/14/18 0001      Moist Heat Therapy   Number Minutes Moist Heat  15 Minutes    Moist Heat Location  Cervical      Electrical Stimulation   Electrical Stimulation Location  Surrounding L UT    Electrical Stimulation Action  IFC    Electrical Stimulation Parameters  80-'150hz' ; intensity: 9 to tolerance    Electrical Stimulation Goals  Pain      Manual Therapy   Manual Therapy  Soft tissue mobilization;Myofascial release    Manual therapy comments  sitting    Soft tissue mobilization  L UT, LS, rhomboid- mildly  increased tightness and soft tissue restriction today    Myofascial Release  manual trigger point release to L LS and UT               PT Short Term Goals - 04/01/18 1149      PT SHORT TERM GOAL #1   Title  Patient to be independent with initial HEP.    Time  3    Period  Weeks    Status  On-going        PT Long Term Goals - 04/09/18 1141      PT LONG TERM GOAL #1   Title  Patient to be independent with advanced HEP.    Time  6    Period  Weeks    Status  On-going      PT LONG TERM GOAL #2   Title  Patient to improve cervical AROM symmetrical to B sides and without pain limiting.     Time  6    Period  Weeks    Status  On-going      PT LONG TERM GOAL #3   Title  Patient to demonstrate good postural awareness with ability to self-correct.    Time  6    Period  Weeks    Status  On-going      PT LONG TERM GOAL #4   Title  Patient to report ability to perform vacuuming without neck pain limiting function.    Time  6    Period  Weeks    Status  On-going            Plan - 04/14/18 0917    Clinical Impression Statement  Patient arrived to session with report of increased L sided neck pain today after performing a lot of computer and desk work yesterday. Patient reports she is having an arthritic flare up. Patient requested STM to L shoulder today, avoiding neck due to pain. Increased tenderness and soft tissue restriction to L UT, LS, and rhomboid today with gentle STM. Received e-stim and moist heat to cervical spine at end of session for pain relief. Normal integumentary response observed. Questioned patient about personal TENS unit- patient reports she is not interested in a personal unit despite receiving benefit from e-stim in clinic. Advised patient to perform computer work in intervals with stretches in between rather than performing it all at once. Patient reported understanding.     PT Treatment/Interventions  ADLs/Self Care Home  Management;Cryotherapy;Electrical Stimulation;Iontophoresis 69m/ml Dexamethasone;Moist Heat;Traction;Ultrasound;Neuromuscular re-education;Therapeutic exercise;Therapeutic activities;Patient/family education;Manual techniques;Passive range of motion;Vasopneumatic  Device;Taping;Dry needling;Splinting;Energy conservation;Scar mobilization;Functional mobility training    Consulted and Agree with Plan of Care  Patient       Patient will benefit from skilled therapeutic intervention in order to improve the following deficits and impairments:  Pain, Increased fascial restricitons, Impaired UE functional use, Decreased activity tolerance, Decreased range of motion, Decreased strength, Postural dysfunction, Impaired flexibility  Visit Diagnosis: Cervicalgia  Other symptoms and signs involving the musculoskeletal system  Abnormal posture     Problem List Patient Active Problem List   Diagnosis Date Noted  . Occipital neuralgia 08/07/2017  . Constipation 08/07/2017  . 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 Left foot pain 05/01/2017  . Obesity (BMI 30-39.9) 02/21/2017  . CKD (chronic kidney disease), stage III ([place]) 02/21/2017  . Chest pain 02/20/2017  . Hypertension 02/20/2017  . HLD (hyperlipidemia) 02/20/2017  . Neck pain 01/22/2016  . History of colonic polyps 07/15/2015  . Preventative health care 02/01/2015  . Fibrocystic breast 05/01/2014  . 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 Decreased visual acuity 01/23/2014  . Sun-damaged skin 01/23/2014  . Hot flashes 01/23/2014  . Recurrent BCC (basal cell carcinoma) 10/13/2013  . Cancer ([place])   . Cataract     Janene Harvey, PT, DPT 04/14/18 9:31 AM   [facility] [address]  [place], [place], [address] Phone: [phone]   Fax:  [phone]  Name: Laura [name] MRN: [id number] Date of Birth: [birth year]  PHYSICAL THERAPY DISCHARGE SUMMARY  Visits from Start  of Care: 4  Current functional level related to goals / functional outcomes: Unable to assess; patient did not return after last session   Remaining deficits: Unable to assess   Education / Equipment: HEP  Plan: Patient agrees to discharge.  Patient goals were not met. Patient is being discharged due to not returning since the last visit.  ?????     Janene Harvey, PT, DPT 05/18/18 10:14 AM

## 2018-04-16 ENCOUNTER — Encounter: Payer: Medicare Other | Admitting: Physical Therapy

## 2018-04-16 ENCOUNTER — Ambulatory Visit: Payer: Medicare Other | Admitting: Physical Therapy

## 2018-04-20 ENCOUNTER — Encounter: Payer: Medicare Other | Admitting: Physical Therapy

## 2018-04-20 ENCOUNTER — Ambulatory Visit: Payer: Medicare Other | Admitting: Physical Therapy

## 2018-04-21 ENCOUNTER — Ambulatory Visit: Payer: Medicare Other | Admitting: Physical Therapy

## 2018-04-23 ENCOUNTER — Encounter: Payer: Medicare Other | Admitting: Physical Therapy

## 2018-04-26 DIAGNOSIS — Z23 Encounter for immunization: Secondary | ICD-10-CM | POA: Diagnosis not present

## 2018-04-27 ENCOUNTER — Encounter: Payer: Medicare Other | Admitting: Physical Therapy

## 2018-04-30 ENCOUNTER — Encounter: Payer: Medicare Other | Admitting: Physical Therapy

## 2018-04-30 ENCOUNTER — Encounter: Payer: Self-pay | Admitting: Family Medicine

## 2018-05-12 ENCOUNTER — Other Ambulatory Visit: Payer: Self-pay | Admitting: Family Medicine

## 2018-07-01 ENCOUNTER — Other Ambulatory Visit: Payer: Self-pay | Admitting: Family Medicine

## 2018-07-01 DIAGNOSIS — Z1231 Encounter for screening mammogram for malignant neoplasm of breast: Secondary | ICD-10-CM

## 2018-08-10 ENCOUNTER — Ambulatory Visit (HOSPITAL_BASED_OUTPATIENT_CLINIC_OR_DEPARTMENT_OTHER)
Admission: RE | Admit: 2018-08-10 | Discharge: 2018-08-10 | Disposition: A | Discharge status: Home / Self Care | Payer: Medicare Other | Source: Ambulatory Visit | Attending: Family Medicine | Admitting: Family Medicine

## 2018-08-10 ENCOUNTER — Encounter (HOSPITAL_BASED_OUTPATIENT_CLINIC_OR_DEPARTMENT_OTHER): Payer: Self-pay

## 2018-08-10 DIAGNOSIS — Z1231 Encounter for screening mammogram for malignant neoplasm of breast: Secondary | ICD-10-CM | POA: Diagnosis not present

## 2018-08-18 ENCOUNTER — Ambulatory Visit: Payer: Medicare Other | Admitting: Family Medicine

## 2018-08-20 ENCOUNTER — Ambulatory Visit: Payer: Medicare Other | Admitting: Family Medicine

## 2018-09-05 IMAGING — DX DG CHEST 2V
2 series · 2 of 2 positions shown · non-contrast
Comparison: None.

CLINICAL DATA: Left chest pain

EXAM:
CHEST  2 VIEW

[chest pa]
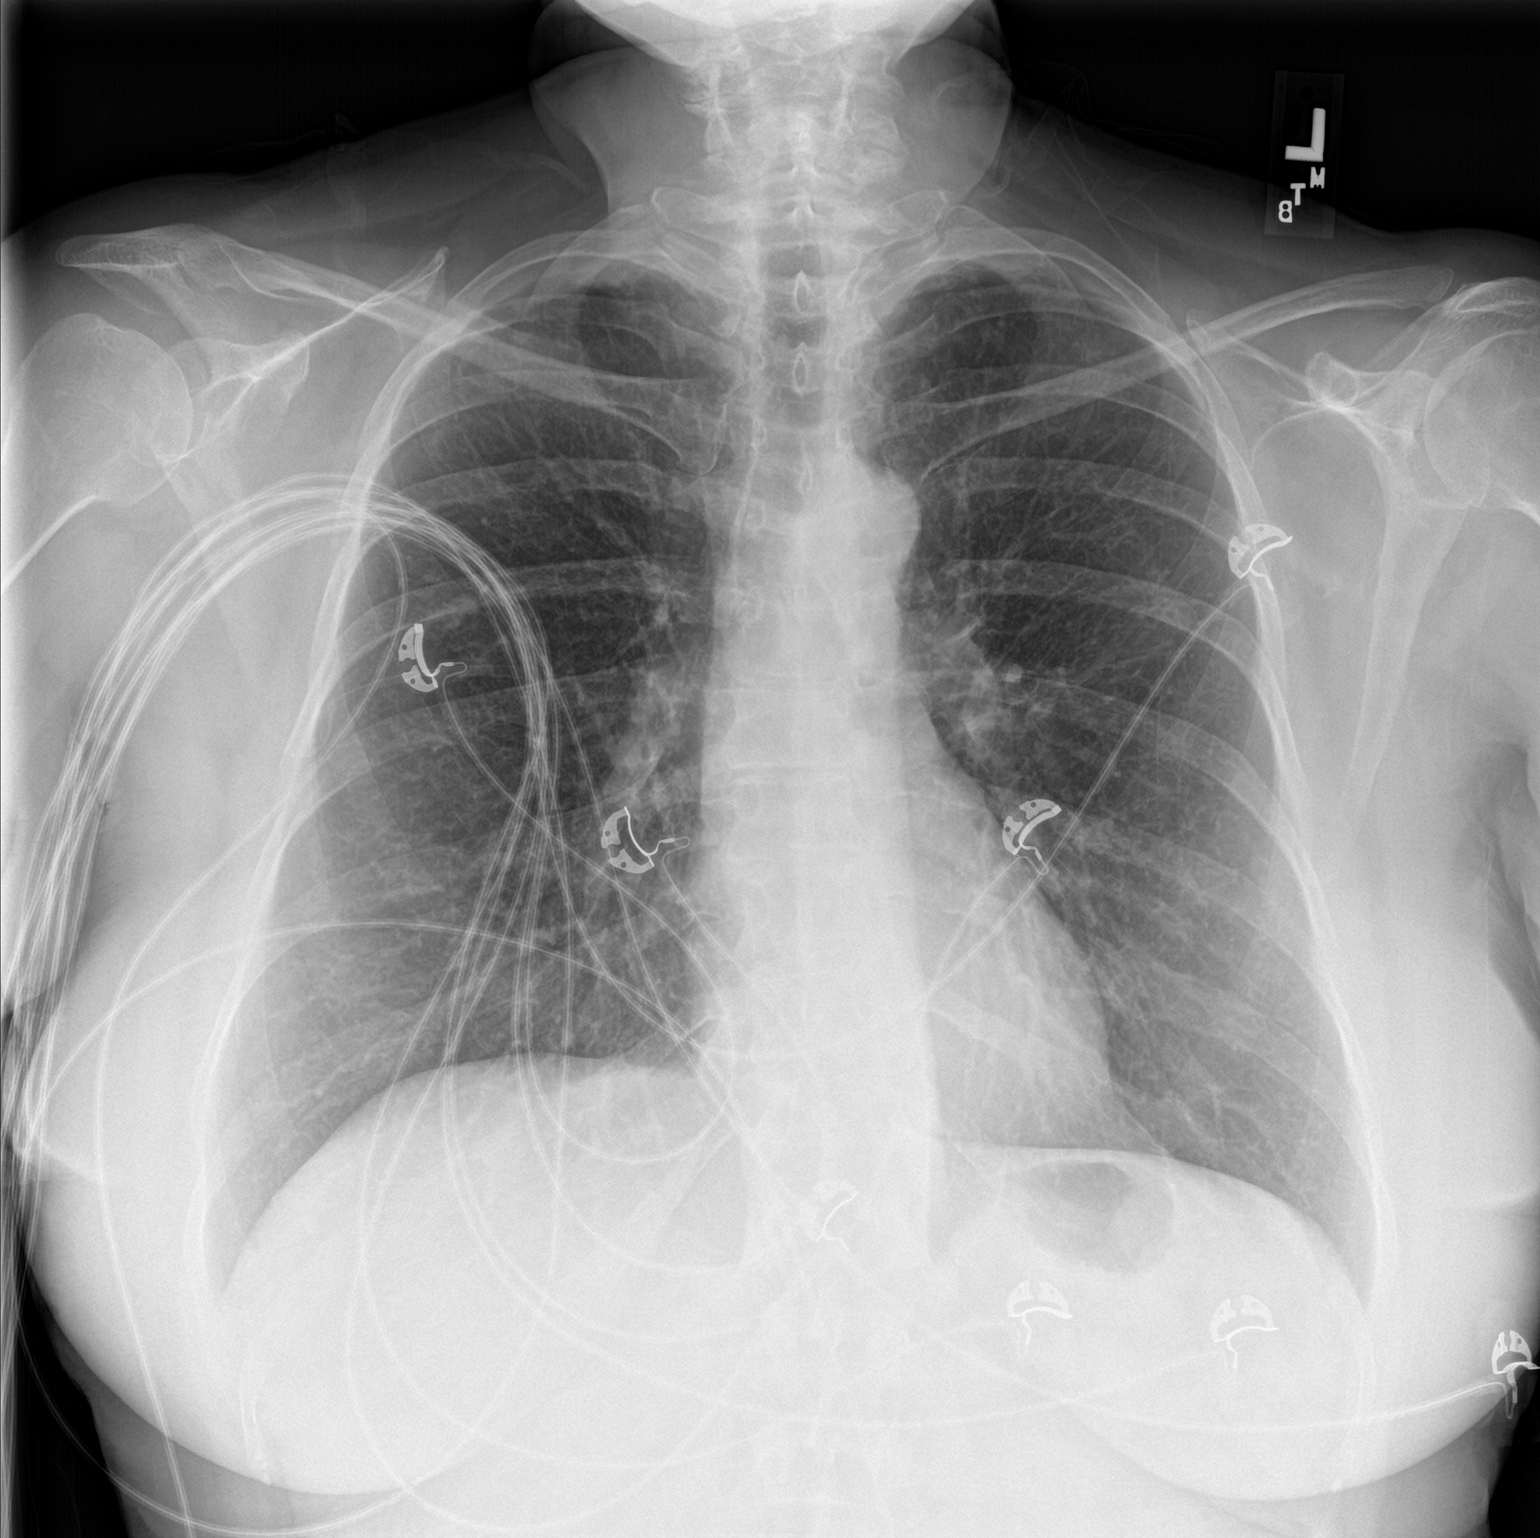

[chest lat]
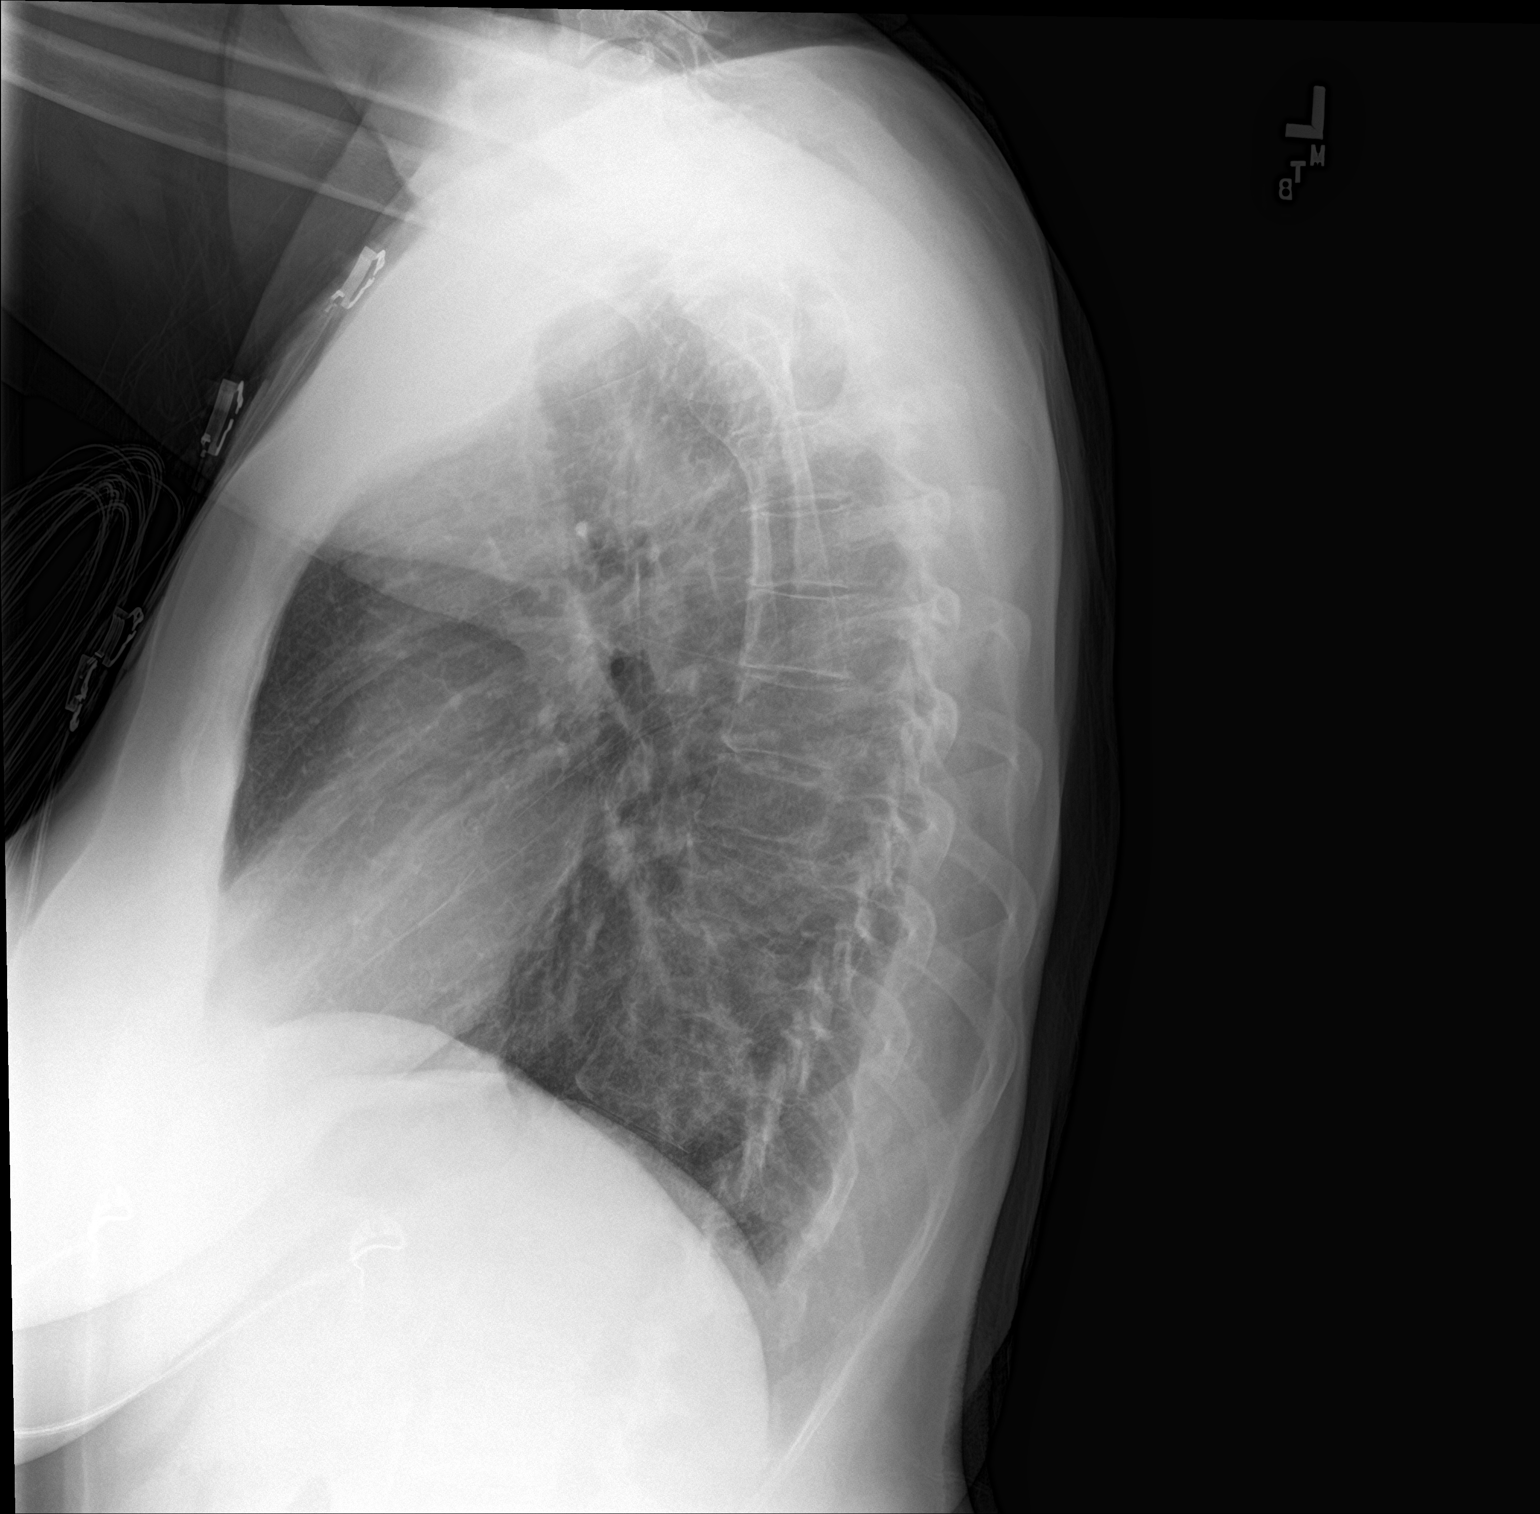

[2 of 2 positions shown; findings below may reference images not displayed]

FINDINGS: Heart and mediastinal contours are within normal limits. No focal
opacities or effusions. No acute bony abnormality.
IMPRESSION: No active cardiopulmonary disease.

## 2018-09-22 ENCOUNTER — Ambulatory Visit: Payer: Medicare Other | Admitting: Internal Medicine

## 2018-09-22 DIAGNOSIS — M25552 Pain in left hip: Secondary | ICD-10-CM | POA: Diagnosis not present

## 2018-09-22 DIAGNOSIS — M5442 Lumbago with sciatica, left side: Secondary | ICD-10-CM | POA: Diagnosis not present

## 2018-09-23 ENCOUNTER — Other Ambulatory Visit (HOSPITAL_BASED_OUTPATIENT_CLINIC_OR_DEPARTMENT_OTHER): Payer: Self-pay | Admitting: Neurosurgery

## 2018-09-23 DIAGNOSIS — M5442 Lumbago with sciatica, left side: Secondary | ICD-10-CM

## 2018-09-26 ENCOUNTER — Ambulatory Visit (HOSPITAL_BASED_OUTPATIENT_CLINIC_OR_DEPARTMENT_OTHER)
Admission: RE | Admit: 2018-09-26 | Discharge: 2018-09-26 | Disposition: A | Discharge status: Home / Self Care | Payer: Medicare Other | Source: Ambulatory Visit | Attending: Neurosurgery | Admitting: Neurosurgery

## 2018-09-26 DIAGNOSIS — M5442 Lumbago with sciatica, left side: Secondary | ICD-10-CM | POA: Diagnosis not present

## 2018-09-26 DIAGNOSIS — M4726 Other spondylosis with radiculopathy, lumbar region: Secondary | ICD-10-CM | POA: Diagnosis not present

## 2018-09-26 DIAGNOSIS — M4807 Spinal stenosis, lumbosacral region: Secondary | ICD-10-CM | POA: Diagnosis not present

## 2018-09-26 DIAGNOSIS — M5117 Intervertebral disc disorders with radiculopathy, lumbosacral region: Secondary | ICD-10-CM | POA: Diagnosis not present

## 2018-09-30 DIAGNOSIS — M25559 Pain in unspecified hip: Secondary | ICD-10-CM | POA: Diagnosis not present

## 2018-09-30 DIAGNOSIS — I10 Essential (primary) hypertension: Secondary | ICD-10-CM | POA: Diagnosis not present

## 2018-10-01 DIAGNOSIS — M461 Sacroiliitis, not elsewhere classified: Secondary | ICD-10-CM | POA: Diagnosis not present

## 2018-10-01 DIAGNOSIS — M7062 Trochanteric bursitis, left hip: Secondary | ICD-10-CM | POA: Diagnosis not present

## 2018-10-02 ENCOUNTER — Encounter: Payer: Self-pay | Admitting: Family Medicine

## 2018-10-09 ENCOUNTER — Other Ambulatory Visit: Payer: Self-pay | Admitting: Family Medicine

## 2018-10-09 DIAGNOSIS — M7062 Trochanteric bursitis, left hip: Secondary | ICD-10-CM | POA: Diagnosis not present

## 2018-11-28 IMAGING — CR DG CERVICAL SPINE 2 OR 3 VIEWS
2 series · 2 of 2 positions shown · non-contrast
Comparison: MRI of the cervical spine January 04, 2017

CLINICAL DATA: C3-4 through C5-6 ACDF.

EXAM:
CERVICAL SPINE - 2-3 VIEW

[xtable (1 of 2)]
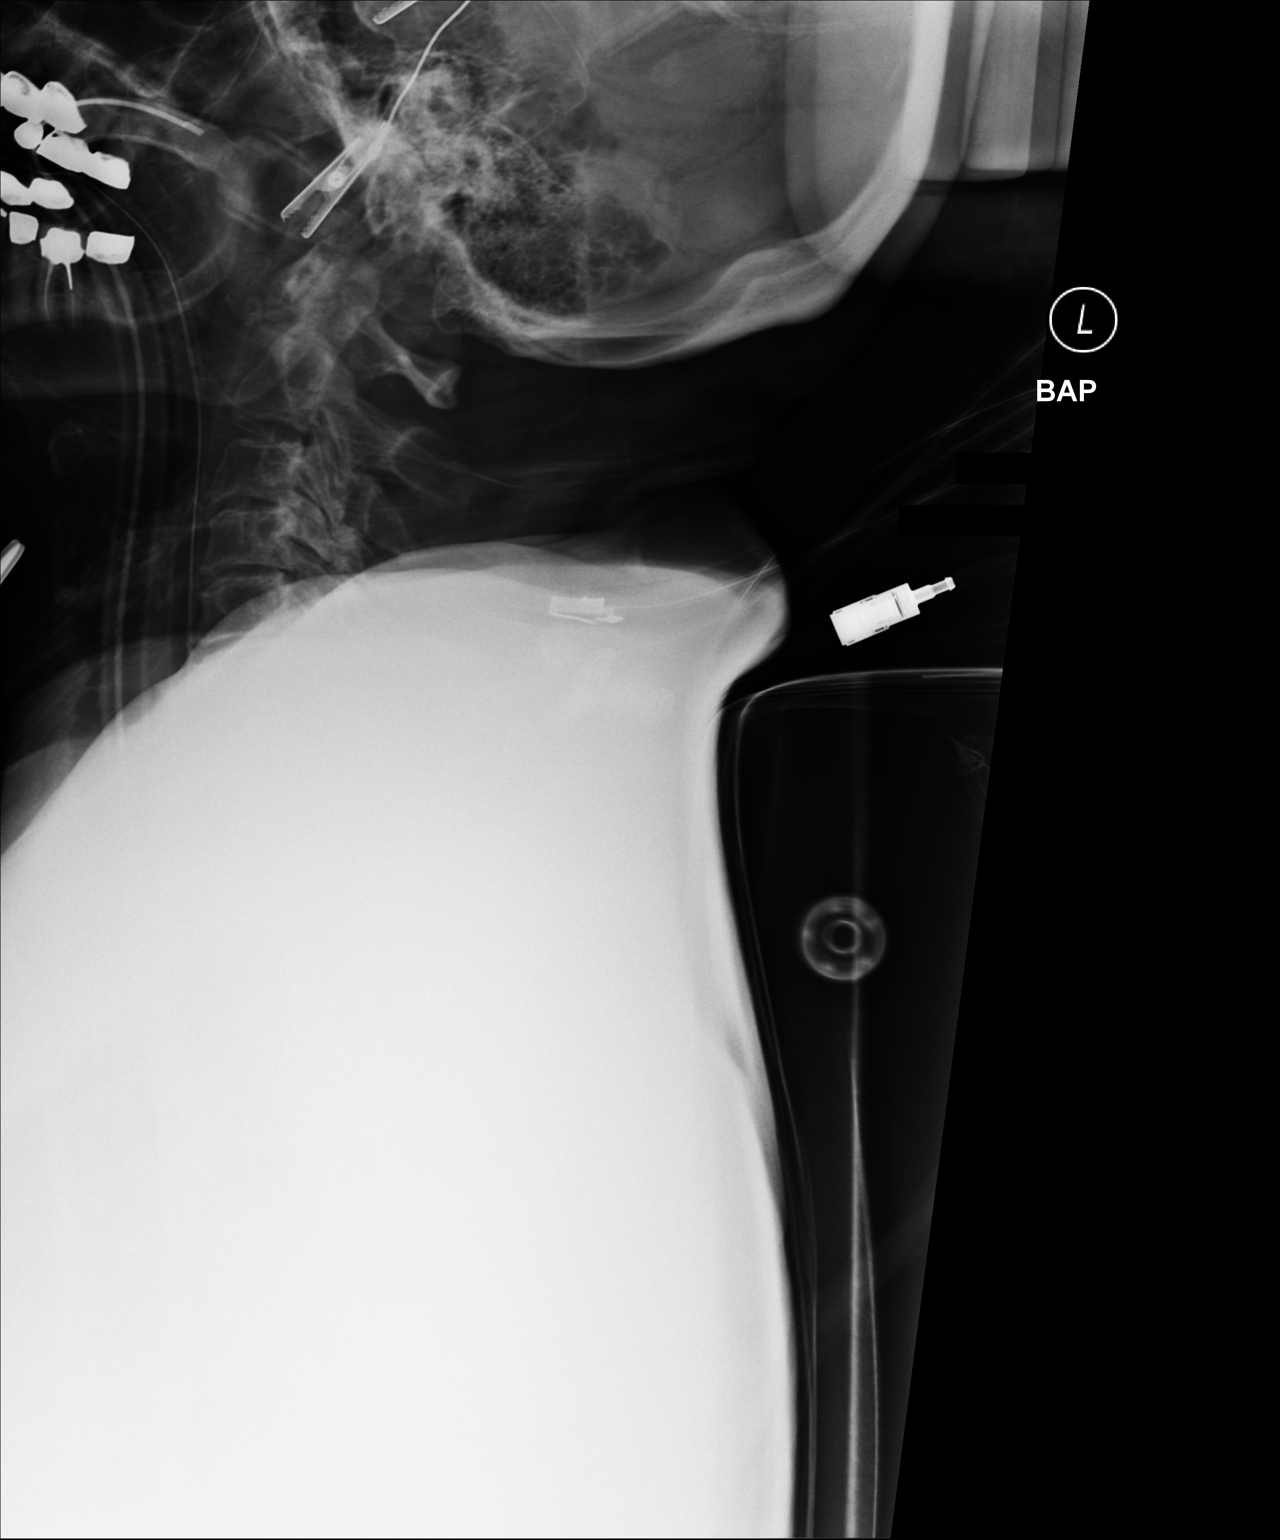

[xtable (2 of 2)]
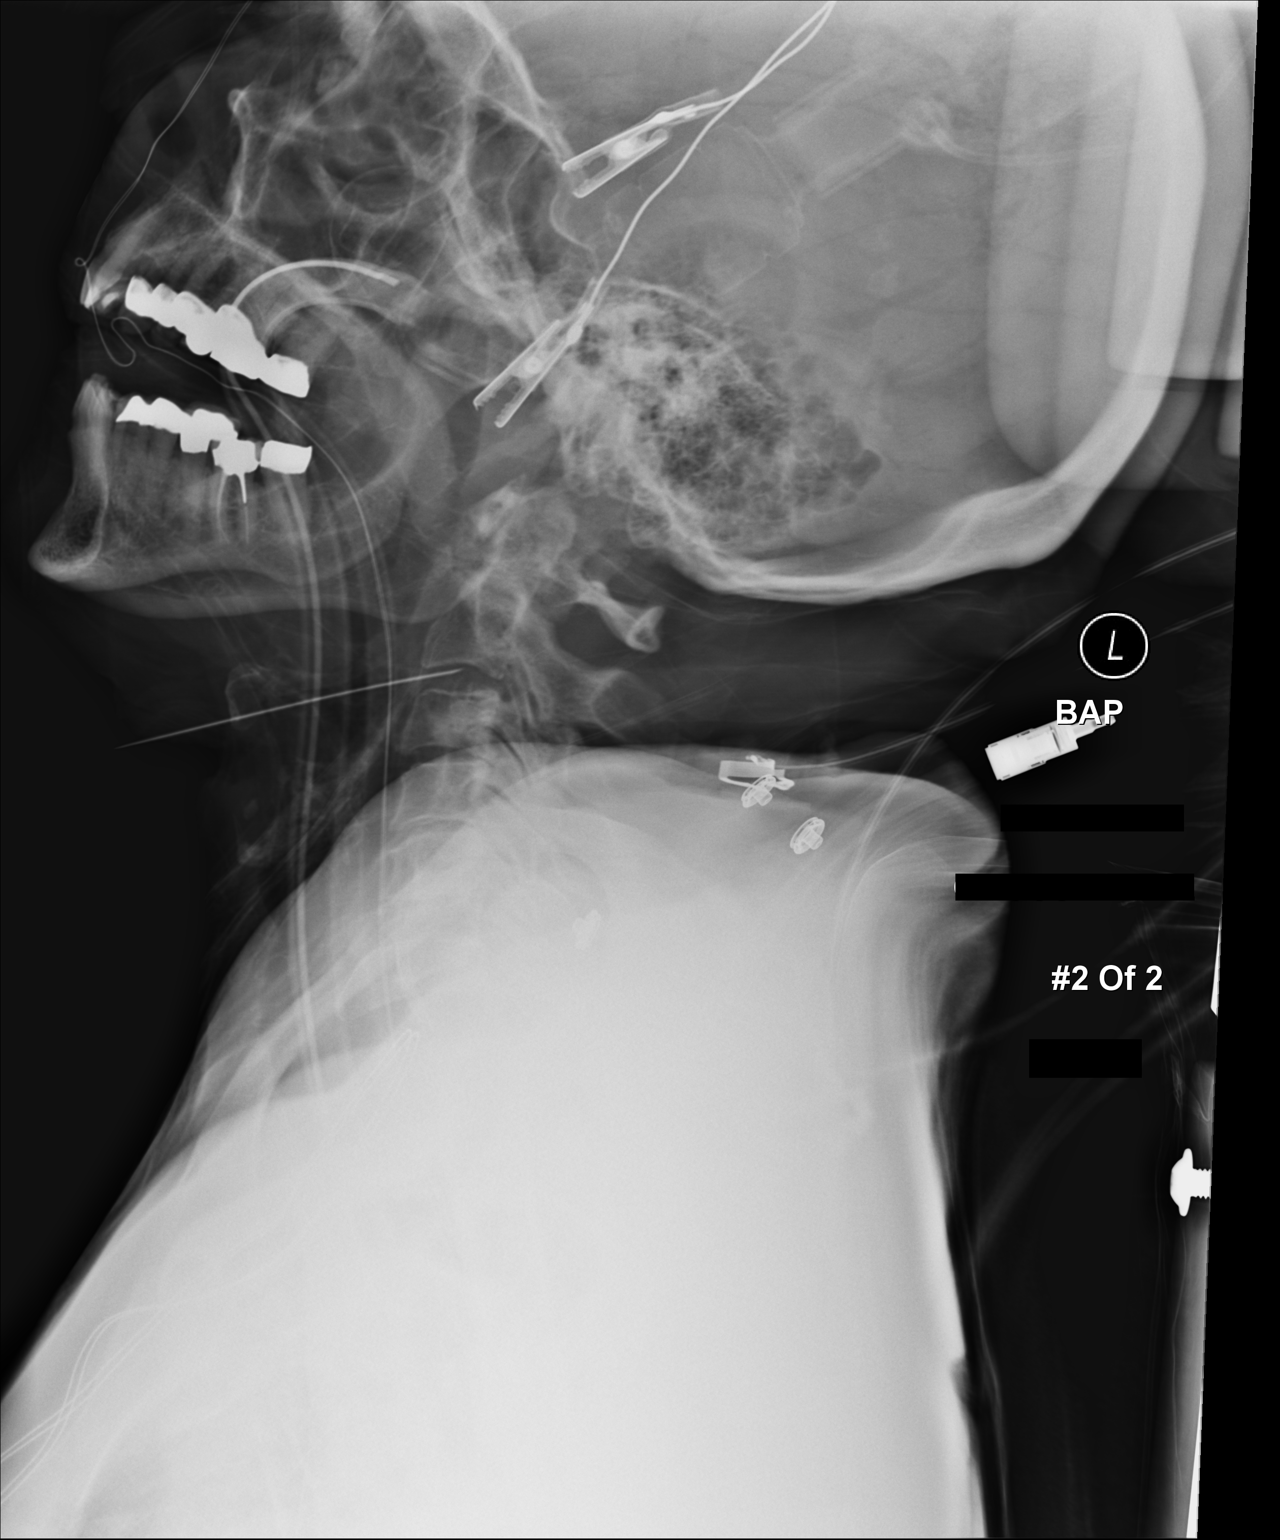

[2 of 2 positions shown; findings below may reference images not displayed]

FINDINGS: 2 intraoperative lateral radiographs of the upper cervical spine.

Image [DATE] without surgical instruments. Life-support lines in place.
Image [DATE] demonstrates surgical needle like instrument via anterior
approach within the C2-3 disc space.
IMPRESSION: Lateral intraoperative radiograph for localization indicating C2-3
disc space.

## 2018-11-28 IMAGING — CR DG CERVICAL SPINE 1V
1 series · 1 of 1 positions shown · non-contrast
Comparison: Earlier same day localizing radiographs.

CLINICAL DATA: ACDF C3-4 and C4-5.

EXAM:
CERVICAL SPINE 1 VIEW

[xtable]
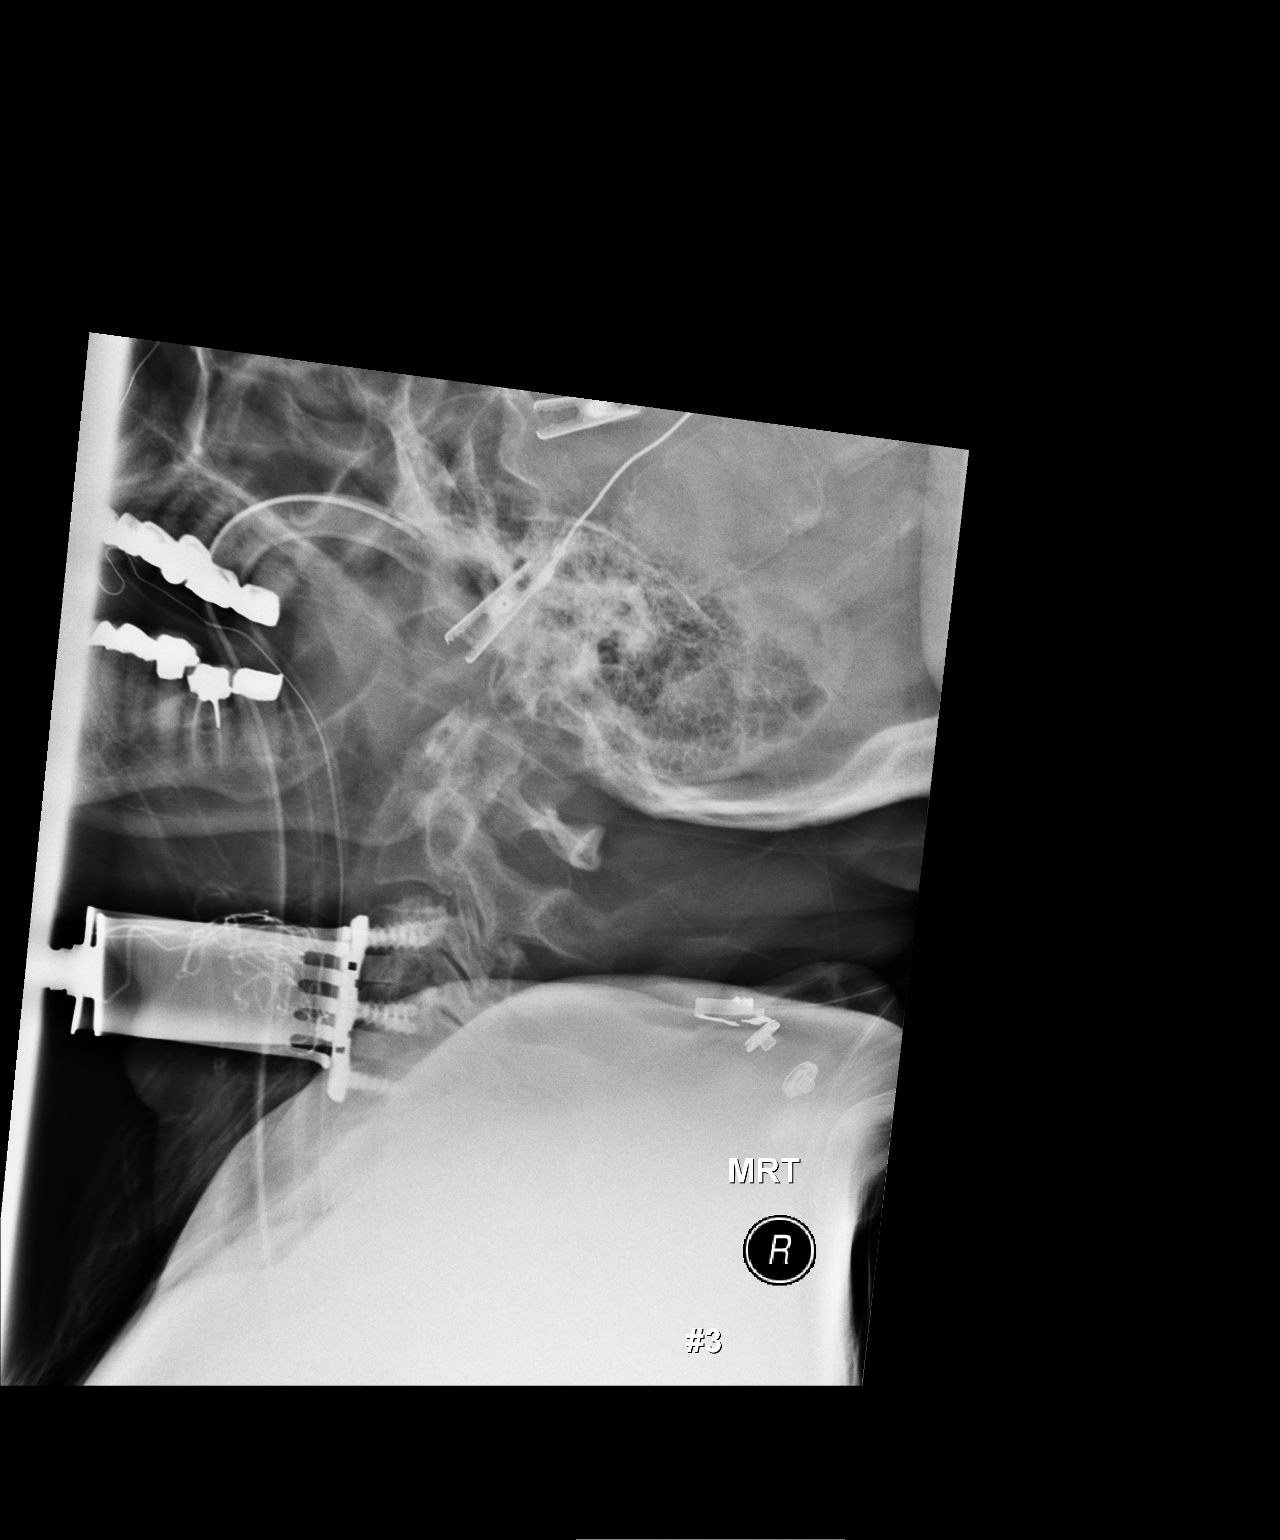

[1 of 1 positions shown; findings below may reference images not displayed]

FINDINGS: Anterior cervical disc fusion hardware is seen spanning C3 through
C5 with interbody blocks noted.
IMPRESSION: ACDF hardware with interbody block spanning C3 through C5.

## 2018-12-10 ENCOUNTER — Ambulatory Visit (INDEPENDENT_AMBULATORY_CARE_PROVIDER_SITE_OTHER): Payer: Medicare Other | Admitting: Family Medicine

## 2018-12-10 ENCOUNTER — Other Ambulatory Visit: Payer: Self-pay

## 2018-12-10 DIAGNOSIS — M542 Cervicalgia: Secondary | ICD-10-CM | POA: Diagnosis not present

## 2018-12-10 DIAGNOSIS — E785 Hyperlipidemia, unspecified: Secondary | ICD-10-CM | POA: Diagnosis not present

## 2018-12-10 DIAGNOSIS — Z7189 Other specified counseling: Secondary | ICD-10-CM | POA: Insufficient documentation

## 2018-12-10 DIAGNOSIS — I10 Essential (primary) hypertension: Secondary | ICD-10-CM | POA: Diagnosis not present

## 2018-12-10 DIAGNOSIS — N183 Chronic kidney disease, stage 3 unspecified: Secondary | ICD-10-CM

## 2018-12-10 NOTE — Assessment & Plan Note (Signed)
Hydrate and monitor 

## 2018-12-10 NOTE — Assessment & Plan Note (Signed)
Encouraged heart healthy diet, increase exercise, avoid trans fats 

## 2018-12-10 NOTE — Assessment & Plan Note (Signed)
no changes to meds. Encouraged heart healthy diet such as the DASH diet and exercise as tolerated.  

## 2018-12-10 NOTE — Assessment & Plan Note (Signed)
Encouraged to continue to quarantine, mask, hand wash, clean well and stay active, eat well, hydrate and add Vitamin C and Zinc daily and call if you develops symptoms

## 2018-12-13 NOTE — Progress Notes (Signed)
Virtual Visit via Video Note  I connected with Laura [name] on 12/10/2018 at  9:00 AM EDT by a Telephone enabled telemedicine application and verified that I am speaking with the correct person using two identifiers.  Location: Patient: home Provider: office   I discussed the limitations of evaluation and management by telemedicine and the availability of in person appointments. The patient expressed understanding and agreed to proceed. Magdalene Molly, CMA attempted to get the patient set up on the video platform but was unsuccessful so visit was completed with the telephone    Subjective:    Patient ID: Laura [name], female    DOB: 05-25-1941, 78 y.o.   MRN: [id number]  No chief complaint on file.   HPI Patient is in today for follow up on chronic medical concerns. No acute concerns today but she does continue to manage her neck pain daily, it does not stop her from managing her ADLs. She did suffer from a bad URI with a lose of taste and smell that lasted about 2 weeks but that has all resolved.s he is just left with some fatigue. Denies CP/palp/SOB/HA/congestion/fevers/GI or GU c/o. Taking meds as prescribed  Past Medical History:  Diagnosis Date  . Adenomatous colon polyp 03/2012  . Cancer ([place])    basal on back and nose  . Cataract   . Chicken pox as a child  . CKD (chronic kidney disease), stage III ([place]) 02/21/2017  . DDD (degenerative disc disease), cervical   . EBV infection    mono as child  . Fibrocystic breast 05/01/2014  . GERD (gastroesophageal reflux disease)    pt denies  . Hyperlipidemia   . Hypertension 20 yrs ago  . Measles as a child  . Medicare annual wellness visit, subsequent 02/01/2015  . Mumps as a child  . Neck pain 01/22/2016  . Other and unspecified hyperlipidemia 10/13/2013  . Overweight(278.02)   . PONV (postoperative nausea and vomiting)    pt would like something to prevent nausea  . Vasovagal reaction 07/15/2015   no syncope but close      Past Surgical History:  Procedure Laterality Date  . ABDOMINAL HYSTERECTOMY  29 yrs ago   heavy bleeding, ovaries left in place  . ANTERIOR CERVICAL DECOMP/DISCECTOMY FUSION N/A 05/15/2017   Procedure: ANTERIOR CERVICAL DECOMPRESSION/DISCECTOMY FUSION CERVICL THREE CERVICAL FOUR, CERVICAL FOUR- CERVICAL FIVE;  Surgeon: Ashok Pall, MD;  Location: [facility];  Service: Neurosurgery;  Laterality: N/A;  . 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 COLONOSCOPY    . EYE SURGERY  11-27-2010   cataract surgery in both eyes  . POLYPECTOMY    . VAGINAL HYSTERECTOMY  78 yrs old   uterus removed    Family History  Problem Relation Age of Onset  . Heart disease Father   . Heart attack Father 71  . Hypertension Father   . Dementia Maternal Grandmother   . Heart disease Maternal Grandfather   . Hyperlipidemia Paternal Grandmother   . Cancer Paternal Grandfather        stomach  . Heart attack Paternal Grandfather   . Stomach cancer Paternal Grandfather   . Dementia Mother   . Pulmonary embolism Son   . Vasculitis Son   . Pyruvate dehydrogenase deficiency Son   . Other Son        vasculitis led to PE  . Colon cancer Neg Hx   . Colon polyps Neg Hx   . Rectal cancer  Neg Hx     Social History   Socioeconomic History  . Marital status: Married    Spouse name: Not on file  . Number of children: Not on file  . Years of education: Not on file  . Highest education level: Not on file  Occupational History  . Not on file  Social Needs  . Financial resource strain: Not on file  . Food insecurity:    Worry: Not on file    Inability: Not on file  . Transportation needs:    Medical: Not on file    Non-medical: Not on file  Tobacco Use  . Smoking status: Former Smoker    Packs/day: 1.00    Years: 16.00    Pack years: 16.00    Types: Cigarettes    Start date: 07/29/1972  . Smokeless tobacco: Never Used  Substance and Sexual Activity  . Alcohol  use: No    Alcohol/week: 0.0 standard drinks  . Drug use: No  . Sexual activity: Never    Comment: lives with husband no major dietary restrictions  Lifestyle  . Physical activity:    Days per week: Not on file    Minutes per session: Not on file  . Stress: Not on file  Relationships  . Social connections:    Talks on phone: Not on file    Gets together: Not on file    Attends religious service: Not on file    Active member of club or organization: Not on file    Attends meetings of clubs or organizations: Not on file    Relationship status: Not on file  . Intimate partner violence:    Fear of current or ex partner: Not on file    Emotionally abused: Not on file    Physically abused: Not on file    Forced sexual activity: Not on file  Other Topics Concern  . Not on file  Social History Narrative  . Not on file    Outpatient Medications Prior to Visit  Medication Sig Dispense Refill  . acetaminophen (TYLENOL) 500 MG tablet Take 500 mg by mouth 2 (two) times daily as needed for mild pain.     . cholecalciferol (VITAMIN D) 1000 units tablet Take 1,000 Units by mouth daily.    . hydrochlorothiazide (MICROZIDE) 12.5 MG capsule TAKE 1 CAPSULE BY MOUTH EVERY DAY 90 capsule 0  . hydrocortisone-pramoxine (PROCTOFOAM HC) rectal foam Place 1 applicator rectally 2 (two) times daily. (Patient not taking: Reported on 03/18/2018) 10 g 0  . losartan (COZAAR) 25 MG tablet TAKE 2 TABLETS EVERY DAY 180 tablet 0  . naproxen (NAPROSYN) 500 MG tablet Take 500 mg by mouth at bedtime.   1  . tiZANidine (ZANAFLEX) 4 MG tablet Take 1 tablet (4 mg total) by mouth every 6 (six) hours as needed for muscle spasms. 60 tablet 0   No facility-administered medications prior to visit.     Allergies  Allergen Reactions  . Lisinopril Other (See Comments)    dizzy  . Statins Other (See Comments)    Muscle weakness   . Sulfa Antibiotics Other (See Comments)    "feels feverish"  . Tetracyclines & Related  Other (See Comments)    GI upset  . Benadryl [Diphenhydramine] Other (See Comments)    Urinary incontinence stopped when med discontinued    Review of Systems  Constitutional: Positive for malaise/fatigue. Negative for fever.  HENT: Negative for congestion.   Eyes: Negative for blurred vision.  Respiratory:  Negative for shortness of breath.   Cardiovascular: Negative for chest pain, palpitations and leg swelling.  Gastrointestinal: Negative for abdominal pain, blood in stool and nausea.  Genitourinary: Negative for dysuria and frequency.  Musculoskeletal: Positive for neck pain. Negative for falls.  Skin: Negative for rash.  Neurological: Negative for dizziness, loss of consciousness and headaches.  Endo/Heme/Allergies: Negative for environmental allergies.  Psychiatric/Behavioral: Negative for depression. The patient is not nervous/anxious.        Objective:    Physical Exam Vitals signs and nursing note reviewed.  Constitutional:      General: She is not in acute distress.    Appearance: Normal appearance. She is well-developed. She is not ill-appearing.  HENT:     Head: Normocephalic and atraumatic.     Nose: Nose normal.  Eyes:     General:        Right eye: No discharge.        Left eye: No discharge.  Neck:     Musculoskeletal: Normal range of motion.  Pulmonary:     Effort: Pulmonary effort is normal.  Skin:    General: Skin is dry.  Neurological:     Mental Status: She is alert and oriented to person, place, and time.  Psychiatric:        Mood and Affect: Mood normal.        Behavior: Behavior normal.     BP (!) 134/92   Pulse 60  Wt Readings from Last 3 Encounters:  02/12/18 166 lb (75.3 kg)  09/14/17 165 lb (74.8 kg)  08/07/17 165 lb 12.8 oz (75.2 kg)    Diabetic Foot Exam - Simple   No data filed     Lab Results  Component Value Date   WBC 4.9 02/24/2018   HGB 13.6 02/24/2018   HCT 40.0 02/24/2018   PLT 208.0 02/24/2018   GLUCOSE 92  02/12/2018   CHOL 226 (H) 02/12/2018   TRIG 167.0 (H) 02/12/2018   HDL 53.60 02/12/2018   LDLCALC 139 (H) 02/12/2018   ALT 11 02/12/2018   AST 14 02/12/2018   NA 137 02/12/2018   K 4.1 02/12/2018   CL 101 02/12/2018   CREATININE 0.94 02/12/2018   BUN 19 02/12/2018   CO2 31 02/12/2018   TSH 2.73 02/12/2018    Lab Results  Component Value Date   TSH 2.73 02/12/2018   Lab Results  Component Value Date   WBC 4.9 02/24/2018   HGB 13.6 02/24/2018   HCT 40.0 02/24/2018   MCV 88.7 02/24/2018   PLT 208.0 02/24/2018   Lab Results  Component Value Date   NA 137 02/12/2018   K 4.1 02/12/2018   CO2 31 02/12/2018   GLUCOSE 92 02/12/2018   BUN 19 02/12/2018   CREATININE 0.94 02/12/2018   BILITOT 0.4 02/12/2018   ALKPHOS 46 02/12/2018   AST 14 02/12/2018   ALT 11 02/12/2018   PROT 6.6 02/12/2018   ALBUMIN 4.3 02/12/2018   CALCIUM 9.4 02/12/2018   ANIONGAP 10 05/09/2017   GFR 61.58 02/12/2018   Lab Results  Component Value Date   CHOL 226 (H) 02/12/2018   Lab Results  Component Value Date   HDL 53.60 02/12/2018   Lab Results  Component Value Date   LDLCALC 139 (H) 02/12/2018   Lab Results  Component Value Date   TRIG 167.0 (H) 02/12/2018   Lab Results  Component Value Date   CHOLHDL 4 02/12/2018   No results found for: HGBA1C  Assessment & Plan:   Problem List Items Addressed This Visit    Neck pain    Encouraged moist heat and gentle stretching as tolerated. May try NSAIDs and prescription meds as directed and report if symptoms worsen or seek immediate care      Hypertension    no changes to meds. Encouraged heart healthy diet such as the DASH diet and exercise as tolerated.       HLD (hyperlipidemia)    Encouraged heart healthy diet, increase exercise, avoid trans fats      CKD (chronic kidney disease), stage III ([place])    Hydrate and monitor      Educated About Covid-19 Virus Infection    Encouraged to continue to quarantine, mask, hand  wash, clean well and stay active, eat well, hydrate and add Vitamin C and Zinc daily and call if you develops symptoms         I am having Lelan Pons A. Funes maintain her naproxen, cholecalciferol, acetaminophen, tiZANidine, hydrocortisone-pramoxine, hydrochlorothiazide, and losartan.  No orders of the defined types were placed in this encounter.  I discussed the assessment and treatment lan with the patient. The patient was provided an opportunity to ask questions and all were answered. The patient agreed with the plan and demonstrated an understanding of the instructions.   The patient was advised to call back or seek an in-person evaluation if the symptoms worsen or if the condition fails to improve as anticipated.  I provided 25 minutes of non-face-to-face time during this encounter.   Penni Homans, MD

## 2018-12-13 NOTE — Assessment & Plan Note (Signed)
Encouraged moist heat and gentle stretching as tolerated. May try NSAIDs and prescription meds as directed and report if symptoms worsen or seek immediate care 

## 2019-01-03 ENCOUNTER — Other Ambulatory Visit: Payer: Self-pay | Admitting: Family Medicine

## 2019-01-04 ENCOUNTER — Other Ambulatory Visit: Payer: Self-pay | Admitting: Family Medicine

## 2019-03-08 DIAGNOSIS — Z23 Encounter for immunization: Secondary | ICD-10-CM | POA: Diagnosis not present

## 2019-03-12 ENCOUNTER — Ambulatory Visit (INDEPENDENT_AMBULATORY_CARE_PROVIDER_SITE_OTHER): Payer: Medicare Other | Admitting: Family Medicine

## 2019-03-12 ENCOUNTER — Encounter: Payer: Self-pay | Admitting: Family Medicine

## 2019-03-12 VITALS — BP 129/86

## 2019-03-12 DIAGNOSIS — E785 Hyperlipidemia, unspecified: Secondary | ICD-10-CM | POA: Diagnosis not present

## 2019-03-12 DIAGNOSIS — I10 Essential (primary) hypertension: Secondary | ICD-10-CM

## 2019-03-12 DIAGNOSIS — Z7189 Other specified counseling: Secondary | ICD-10-CM | POA: Diagnosis not present

## 2019-03-12 MED ORDER — FAMOTIDINE 40 MG PO TABS: 40.0000 mg | ORAL_TABLET | Freq: Every day | ORAL | 2 refills | Status: DC | PRN

## 2019-03-12 MED ORDER — TETANUS-DIPHTHERIA TOXOIDS TD 5-2 LFU IM INJ: 0.5000 mL | INJECTION | Freq: Once | INTRAMUSCULAR | 0 refills | Status: AC

## 2019-03-12 MED ORDER — LOSARTAN POTASSIUM-HCTZ 50-12.5 MG PO TABS: 1.0000 | ORAL_TABLET | Freq: Every day | ORAL | 1 refills | Status: DC

## 2019-03-12 NOTE — Assessment & Plan Note (Signed)
Well controlled, no changes to meds. Encouraged heart healthy diet such as the DASH diet and exercise as tolerated.  °

## 2019-03-12 NOTE — Progress Notes (Signed)
Got her flu shot! She thinks she needs a Tetanus booster (last had one 07/29/2010) Wants to know about [facility] Shingrix shot was almost $700 at the pharmacy  Having some GERD issues

## 2019-03-13 ENCOUNTER — Encounter: Payer: Self-pay | Admitting: Family Medicine

## 2019-03-14 NOTE — Progress Notes (Signed)
Patient ID: Laura [name], female   DOB: 03/25/1941, 78 y.o.   MRN: [id number] Virtual Visit via Video Note  03/12/2019 at 10:20 AM EDT by a video enabled telemedicine application and verified that I am speaking with the correct person using two identifiers.  Location: Patient: home Provider: home   I discussed the limitations of evaluation and management by telemedicine and the availability of in person appointments. The patient expressed understanding and agreed to proceed. Magdalene Molly, CMA was able to get patient set up on visit, video   Subjective:    Patient ID: Laura [name], female    DOB: 07-29-1941, 78 y.o.   MRN: [id number]  Chief Complaint  Patient presents with  . Follow-up    HPI Patient is in today for follow up on chronic medical concerns including hyperlipidemia, hypertension and renal insufficiency. No recent febrile illness or hospitalizations. No polyuria or polydipsia. Is maintaining quarantine well. Denies CP/palp/SOB/HA/congestion/fevers/GI or GU c/o. Taking meds as prescribed  Past Medical History:  Diagnosis Date  . Adenomatous colon polyp 03/2012  . 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 Cataract   . Chicken pox as a child  . CKD (chronic kidney disease), stage III ([place]) 02/21/2017  . DDD (degenerative disc disease), cervical   . EBV infection    mono as child  . Fibrocystic breast 05/01/2014  . GERD (gastroesophageal reflux disease)    pt denies  . Hyperlipidemia   . Hypertension 20 yrs ago  . Measles as a child  . Medicare annual wellness visit, subsequent 02/01/2015  . Mumps as a child  . Neck pain 01/22/2016  . Other and unspecified hyperlipidemia 10/13/2013  . Overweight(278.02)   . PONV (postoperative nausea and vomiting)    pt would like something to prevent nausea  . Vasovagal reaction 07/15/2015   no syncope but close     Past Surgical History:  Procedure Laterality Date  . ABDOMINAL HYSTERECTOMY  29 yrs ago   heavy  bleeding, ovaries left in place  . ANTERIOR CERVICAL DECOMP/DISCECTOMY FUSION N/A 05/15/2017   Procedure: ANTERIOR CERVICAL DECOMPRESSION/DISCECTOMY FUSION CERVICL THREE CERVICAL FOUR, CERVICAL FOUR- CERVICAL FIVE;  Surgeon: Ashok Pall, MD;  Location: [facility];  Service: Neurosurgery;  Laterality: N/A;  . 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 COLONOSCOPY    . EYE SURGERY  11-27-2010   cataract surgery in both eyes  . POLYPECTOMY    . VAGINAL HYSTERECTOMY  78 yrs old   uterus removed    Family History  Problem Relation Age of Onset  . Heart disease Father   . Heart attack Father 67  . Hypertension Father   . Dementia Maternal Grandmother   . Heart disease Maternal Grandfather   . Hyperlipidemia Paternal Grandmother   . Cancer Paternal Grandfather        stomach  . Heart attack Paternal Grandfather   . Stomach cancer Paternal Grandfather   . Dementia Mother   . Pulmonary embolism Son   . Vasculitis Son   . Pyruvate dehydrogenase deficiency Son   . Other Son        vasculitis led to PE  . Colon cancer Neg Hx   . Colon polyps Neg Hx   . Rectal cancer Neg Hx     Social History   Socioeconomic History  . Marital status: Married    Spouse name: Not on file  . Number of children: Not on file  .  Years of education: Not on file  . Highest education level: Not on file  Occupational History  . Not on file  Social Needs  . Financial resource strain: Not on file  . Food insecurity    Worry: Not on file    Inability: Not on file  . Transportation needs    Medical: Not on file    Non-medical: Not on file  Tobacco Use  . Smoking status: Former Smoker    Packs/day: 1.00    Years: 16.00    Pack years: 16.00    Types: Cigarettes    Start date: 07/29/1972  . Smokeless tobacco: Never Used  Substance and Sexual Activity  . Alcohol use: No    Alcohol/week: 0.0 standard drinks  . Drug use: No  . Sexual activity: Never    Comment: lives  with husband no major dietary restrictions  Lifestyle  . Physical activity    Days per week: Not on file    Minutes per session: Not on file  . Stress: Not on file  Relationships  . Social Herbalist on phone: Not on file    Gets together: Not on file    Attends religious service: Not on file    Active member of club or organization: Not on file    Attends meetings of clubs or organizations: Not on file    Relationship status: Not on file  . Intimate partner violence    Fear of current or ex partner: Not on file    Emotionally abused: Not on file    Physically abused: Not on file    Forced sexual activity: Not on file  Other Topics Concern  . Not on file  Social History Narrative  . Not on file    Outpatient Medications Prior to Visit  Medication Sig Dispense Refill  . acetaminophen (TYLENOL) 500 MG tablet Take 500 mg by mouth 2 (two) times daily as needed for mild pain.     . cholecalciferol (VITAMIN D) 1000 units tablet Take 1,000 Units by mouth daily.    . naproxen (NAPROSYN) 500 MG tablet Take 500 mg by mouth at bedtime.   1  . hydrochlorothiazide (MICROZIDE) 12.5 MG capsule TAKE 1 CAPSULE BY MOUTH EVERY DAY 90 capsule 0  . hydrocortisone-pramoxine (PROCTOFOAM HC) rectal foam Place 1 applicator rectally 2 (two) times daily. (Patient not taking: Reported on 03/18/2018) 10 g 0  . losartan (COZAAR) 25 MG tablet TAKE 2 TABLETS EVERY DAY 180 tablet 0  . losartan-hydrochlorothiazide (HYZAAR) 50-12.5 MG tablet TAKE 1 TABLET BY MOUTH EVERY DAY 90 tablet 1  . tiZANidine (ZANAFLEX) 4 MG tablet Take 1 tablet (4 mg total) by mouth every 6 (six) hours as needed for muscle spasms. 60 tablet 0   No facility-administered medications prior to visit.     Allergies  Allergen Reactions  . Lisinopril Other (See Comments)    dizzy  . Statins Other (See Comments)    Muscle weakness   . Sulfa Antibiotics Other (See Comments)    "feels feverish"  . Tetracyclines & Related Other  (See Comments)    GI upset  . Benadryl [Diphenhydramine] Other (See Comments)    Urinary incontinence stopped when med discontinued    Review of Systems  Constitutional: Negative for fever and malaise/fatigue.  HENT: Negative for congestion.   Eyes: Negative for blurred vision.  Respiratory: Negative for shortness of breath.   Cardiovascular: Negative for chest pain, palpitations and leg swelling.  Gastrointestinal: Negative  for abdominal pain, blood in stool and nausea.  Genitourinary: Negative for dysuria and frequency.  Musculoskeletal: Negative for falls.  Skin: Negative for rash.  Neurological: Negative for dizziness, loss of consciousness and headaches.  Endo/Heme/Allergies: Negative for environmental allergies.  Psychiatric/Behavioral: Negative for depression. The patient is not nervous/anxious.        Objective:    Physical Exam unable to obtain via phone  BP 129/86 (BP Location: Left Arm, Patient Position: Sitting, Cuff Size: Normal)  Wt Readings from Last 3 Encounters:  02/12/18 166 lb (75.3 kg)  09/14/17 165 lb (74.8 kg)  08/07/17 165 lb 12.8 oz (75.2 kg)    Diabetic Foot Exam - Simple   No data filed     Lab Results  Component Value Date   WBC 4.9 02/24/2018   HGB 13.6 02/24/2018   HCT 40.0 02/24/2018   PLT 208.0 02/24/2018   GLUCOSE 92 02/12/2018   CHOL 226 (H) 02/12/2018   TRIG 167.0 (H) 02/12/2018   HDL 53.60 02/12/2018   LDLCALC 139 (H) 02/12/2018   ALT 11 02/12/2018   AST 14 02/12/2018   NA 137 02/12/2018   K 4.1 02/12/2018   CL 101 02/12/2018   CREATININE 0.94 02/12/2018   BUN 19 02/12/2018   CO2 31 02/12/2018   TSH 2.73 02/12/2018    Lab Results  Component Value Date   TSH 2.73 02/12/2018   Lab Results  Component Value Date   WBC 4.9 02/24/2018   HGB 13.6 02/24/2018   HCT 40.0 02/24/2018   MCV 88.7 02/24/2018   PLT 208.0 02/24/2018   Lab Results  Component Value Date   NA 137 02/12/2018   K 4.1 02/12/2018   CO2 31  02/12/2018   GLUCOSE 92 02/12/2018   BUN 19 02/12/2018   CREATININE 0.94 02/12/2018   BILITOT 0.4 02/12/2018   ALKPHOS 46 02/12/2018   AST 14 02/12/2018   ALT 11 02/12/2018   PROT 6.6 02/12/2018   ALBUMIN 4.3 02/12/2018   CALCIUM 9.4 02/12/2018   ANIONGAP 10 05/09/2017   GFR 61.58 02/12/2018   Lab Results  Component Value Date   CHOL 226 (H) 02/12/2018   Lab Results  Component Value Date   HDL 53.60 02/12/2018   Lab Results  Component Value Date   LDLCALC 139 (H) 02/12/2018   Lab Results  Component Value Date   TRIG 167.0 (H) 02/12/2018   Lab Results  Component Value Date   CHOLHDL 4 02/12/2018   No results found for: HGBA1C     Assessment & Plan:   Problem List Items Addressed This Visit    Hypertension    Well controlled, no changes to meds. Encouraged heart healthy diet such as the DASH diet and exercise as tolerated.       Relevant Medications   losartan-hydrochlorothiazide (HYZAAR) 50-12.5 MG tablet   Other Relevant Orders   CBC   Comprehensive metabolic panel   TSH   HLD (hyperlipidemia) - Primary    Encouraged heart healthy diet, increase exercise, avoid trans fats, consider a krill oil cap daily      Relevant Medications   losartan-hydrochlorothiazide (HYZAAR) 50-12.5 MG tablet   Other Relevant Orders   Lipid panel   Educated About Covid-19 Virus Infection    Encouraged to get a pulse oximeter and monitor vitals weekly and prn. Continue quarantine, mask and social distance as able. Call with any symptoms for testing.          I have discontinued Orie Fisherman.  Dinkel's tiZANidine, hydrocortisone-pramoxine, losartan, and hydrochlorothiazide. I have also changed her losartan-hydrochlorothiazide. Additionally, I am having her start on famotidine and tetanus & diphtheria toxoids (adult). Lastly, I am having her maintain her naproxen, cholecalciferol, and acetaminophen.     I discussed the assessment and treatment plan with the patient. The patient  was provided an opportunity to ask questions and all were answered. The patient agreed with the plan and demonstrated an understanding of the instructions.   The patient was advised to call back or seek an in-person evaluation if the symptoms worsen or if the condition fails to improve as anticipated.  I provided 25 minutes of non-face-to-face time during this encounter.   Penni Homans, MD

## 2019-03-14 NOTE — Assessment & Plan Note (Signed)
Encouraged to get a pulse oximeter and monitor vitals weekly and prn. Continue quarantine, mask and social distance as able. Call with any symptoms for testing.

## 2019-03-14 NOTE — Assessment & Plan Note (Signed)
Encouraged heart healthy diet, increase exercise, avoid trans fats, consider a krill oil cap daily 

## 2019-03-17 ENCOUNTER — Other Ambulatory Visit: Payer: Self-pay

## 2019-03-17 ENCOUNTER — Other Ambulatory Visit (INDEPENDENT_AMBULATORY_CARE_PROVIDER_SITE_OTHER): Payer: Medicare Other

## 2019-03-17 DIAGNOSIS — E785 Hyperlipidemia, unspecified: Secondary | ICD-10-CM

## 2019-03-17 DIAGNOSIS — I10 Essential (primary) hypertension: Secondary | ICD-10-CM | POA: Diagnosis not present

## 2019-03-17 LAB — COMPREHENSIVE METABOLIC PANEL
ALT: 21 U/L (ref 0–35)
AST: 23 U/L (ref 0–37)
Albumin: 4.2 g/dL (ref 3.5–5.2)
Alkaline Phosphatase: 44 U/L (ref 39–117)
BUN: 23 mg/dL (ref 6–23)
CO2: 28 mEq/L (ref 19–32)
Calcium: 9.5 mg/dL (ref 8.4–10.5)
Chloride: 104 mEq/L (ref 96–112)
Creatinine, Ser: 1.04 mg/dL (ref 0.40–1.20)
GFR: 51.41 mL/min — ABNORMAL LOW (ref >60.00)
Glucose, Bld: 114 mg/dL — ABNORMAL HIGH (ref 70–99)
Potassium: 3.8 mEq/L (ref 3.5–5.1)
Sodium: 139 mEq/L (ref 135–145)
Total Bilirubin: 0.3 mg/dL (ref 0.2–1.2)
Total Protein: 6.8 g/dL (ref 6.0–8.3)

## 2019-03-17 LAB — CBC
HCT: 41.9 % (ref 36.0–46.0)
Hemoglobin: 13.9 g/dL (ref 12.0–15.0)
MCHC: 33.2 g/dL (ref 30.0–36.0)
MCV: 89.9 fl (ref 78.0–100.0)
Platelets: 254 10*3/uL (ref 150.0–400.0)
RBC: 4.66 Mil/uL (ref 3.87–5.11)
RDW: 13 % (ref 11.5–15.5)
WBC: 5.5 10*3/uL (ref 4.0–10.5)

## 2019-03-17 LAB — LIPID PANEL
Cholesterol: 230 mg/dL — ABNORMAL HIGH (ref 0–200)
HDL: 44.3 mg/dL (ref >39.00)
LDL Cholesterol: 153 mg/dL — ABNORMAL HIGH (ref 0–99)
NonHDL: 185.26
Total CHOL/HDL Ratio: 5
Triglycerides: 161 mg/dL — ABNORMAL HIGH (ref 0.0–149.0)
VLDL: 32.2 mg/dL (ref 0.0–40.0)

## 2019-03-17 LAB — TSH: TSH: 3.59 u[IU]/mL (ref 0.35–4.50)

## 2019-03-18 ENCOUNTER — Other Ambulatory Visit (INDEPENDENT_AMBULATORY_CARE_PROVIDER_SITE_OTHER): Payer: Medicare Other

## 2019-03-18 DIAGNOSIS — R739 Hyperglycemia, unspecified: Secondary | ICD-10-CM | POA: Diagnosis not present

## 2019-03-18 LAB — HEMOGLOBIN A1C: Hgb A1c MFr Bld: 6 % (ref 4.6–6.5)

## 2019-03-19 ENCOUNTER — Encounter: Payer: Self-pay | Admitting: Family Medicine

## 2019-03-19 ENCOUNTER — Telehealth: Payer: Self-pay | Admitting: *Deleted

## 2019-03-19 DIAGNOSIS — I10 Essential (primary) hypertension: Secondary | ICD-10-CM

## 2019-03-19 DIAGNOSIS — R739 Hyperglycemia, unspecified: Secondary | ICD-10-CM

## 2019-03-19 DIAGNOSIS — E785 Hyperlipidemia, unspecified: Secondary | ICD-10-CM

## 2019-03-19 NOTE — Telephone Encounter (Signed)
Please call patient to set up appointment for lab only appointment on or around 06/18/19.  Future orders are placed.

## 2019-05-05 DIAGNOSIS — D170 Benign lipomatous neoplasm of skin and subcutaneous tissue of head, face and neck: Secondary | ICD-10-CM | POA: Diagnosis not present

## 2019-05-05 DIAGNOSIS — Z08 Encounter for follow-up examination after completed treatment for malignant neoplasm: Secondary | ICD-10-CM | POA: Diagnosis not present

## 2019-05-05 DIAGNOSIS — L821 Other seborrheic keratosis: Secondary | ICD-10-CM | POA: Diagnosis not present

## 2019-05-05 DIAGNOSIS — Z85828 Personal history of other malignant neoplasm of skin: Secondary | ICD-10-CM | POA: Diagnosis not present

## 2019-05-05 DIAGNOSIS — H61002 Unspecified perichondritis of left external ear: Secondary | ICD-10-CM | POA: Diagnosis not present

## 2019-05-26 DIAGNOSIS — H5213 Myopia, bilateral: Secondary | ICD-10-CM | POA: Diagnosis not present

## 2019-05-26 DIAGNOSIS — H524 Presbyopia: Secondary | ICD-10-CM | POA: Diagnosis not present

## 2019-05-26 DIAGNOSIS — H04123 Dry eye syndrome of bilateral lacrimal glands: Secondary | ICD-10-CM | POA: Diagnosis not present

## 2019-05-26 DIAGNOSIS — H26493 Other secondary cataract, bilateral: Secondary | ICD-10-CM | POA: Diagnosis not present

## 2019-06-12 ENCOUNTER — Encounter: Payer: Self-pay | Admitting: Family Medicine

## 2019-06-18 ENCOUNTER — Other Ambulatory Visit: Payer: Self-pay

## 2019-06-18 ENCOUNTER — Other Ambulatory Visit (INDEPENDENT_AMBULATORY_CARE_PROVIDER_SITE_OTHER): Payer: Medicare Other

## 2019-06-18 DIAGNOSIS — E785 Hyperlipidemia, unspecified: Secondary | ICD-10-CM

## 2019-06-18 DIAGNOSIS — I10 Essential (primary) hypertension: Secondary | ICD-10-CM

## 2019-06-18 DIAGNOSIS — R739 Hyperglycemia, unspecified: Secondary | ICD-10-CM | POA: Diagnosis not present

## 2019-06-18 LAB — LIPID PANEL
Cholesterol: 233 mg/dL — ABNORMAL HIGH (ref 0–200)
HDL: 44.7 mg/dL (ref >39.00)
LDL Cholesterol: 161 mg/dL — ABNORMAL HIGH (ref 0–99)
NonHDL: 188.54
Total CHOL/HDL Ratio: 5
Triglycerides: 137 mg/dL (ref 0.0–149.0)
VLDL: 27.4 mg/dL (ref 0.0–40.0)

## 2019-06-18 LAB — COMPREHENSIVE METABOLIC PANEL
ALT: 10 U/L (ref 0–35)
AST: 13 U/L (ref 0–37)
Albumin: 4.2 g/dL (ref 3.5–5.2)
Alkaline Phosphatase: 44 U/L (ref 39–117)
BUN: 16 mg/dL (ref 6–23)
CO2: 29 mEq/L (ref 19–32)
Calcium: 9.3 mg/dL (ref 8.4–10.5)
Chloride: 103 mEq/L (ref 96–112)
Creatinine, Ser: 0.83 mg/dL (ref 0.40–1.20)
GFR: 66.65 mL/min (ref >60.00)
Glucose, Bld: 93 mg/dL (ref 70–99)
Potassium: 4.2 mEq/L (ref 3.5–5.1)
Sodium: 139 mEq/L (ref 135–145)
Total Bilirubin: 0.5 mg/dL (ref 0.2–1.2)
Total Protein: 6.4 g/dL (ref 6.0–8.3)

## 2019-06-18 LAB — HEMOGLOBIN A1C: Hgb A1c MFr Bld: 5.9 % (ref 4.6–6.5)

## 2019-06-22 ENCOUNTER — Encounter: Payer: Self-pay | Admitting: Family Medicine

## 2019-06-23 NOTE — Telephone Encounter (Signed)
Called patient and they decided that they were just going stay in town due to the pandemic getting worse again.

## 2019-06-29 ENCOUNTER — Encounter: Payer: Self-pay | Admitting: Family Medicine

## 2019-06-30 ENCOUNTER — Encounter: Payer: Self-pay | Admitting: Family Medicine

## 2019-07-05 ENCOUNTER — Other Ambulatory Visit (HOSPITAL_BASED_OUTPATIENT_CLINIC_OR_DEPARTMENT_OTHER): Payer: Self-pay | Admitting: Family Medicine

## 2019-07-05 DIAGNOSIS — Z1159 Encounter for screening for other viral diseases: Secondary | ICD-10-CM | POA: Diagnosis not present

## 2019-07-05 DIAGNOSIS — Z1231 Encounter for screening mammogram for malignant neoplasm of breast: Secondary | ICD-10-CM

## 2019-07-26 ENCOUNTER — Encounter: Payer: Self-pay | Admitting: Family Medicine

## 2019-07-28 ENCOUNTER — Encounter: Payer: Self-pay | Admitting: Family Medicine

## 2019-08-02 ENCOUNTER — Encounter: Payer: Self-pay | Admitting: Family Medicine

## 2019-08-06 ENCOUNTER — Encounter: Payer: Self-pay | Admitting: Family Medicine

## 2019-08-10 ENCOUNTER — Encounter: Payer: Self-pay | Admitting: Family Medicine

## 2019-08-11 NOTE — Telephone Encounter (Signed)
Immunization list has been updated.

## 2019-08-12 ENCOUNTER — Other Ambulatory Visit: Payer: Self-pay

## 2019-08-12 ENCOUNTER — Ambulatory Visit (HOSPITAL_BASED_OUTPATIENT_CLINIC_OR_DEPARTMENT_OTHER)
Admission: RE | Admit: 2019-08-12 | Discharge: 2019-08-12 | Disposition: A | Discharge status: Home / Self Care | Payer: Medicare Other | Source: Ambulatory Visit | Attending: Family Medicine | Admitting: Family Medicine

## 2019-08-12 DIAGNOSIS — Z1231 Encounter for screening mammogram for malignant neoplasm of breast: Secondary | ICD-10-CM | POA: Diagnosis not present

## 2019-09-13 ENCOUNTER — Other Ambulatory Visit: Payer: Self-pay

## 2019-09-13 ENCOUNTER — Encounter: Payer: Self-pay | Admitting: Family Medicine

## 2019-09-13 ENCOUNTER — Ambulatory Visit (INDEPENDENT_AMBULATORY_CARE_PROVIDER_SITE_OTHER): Payer: Medicare Other | Admitting: Family Medicine

## 2019-09-13 VITALS — BP 134/74 | HR 73 | Temp 96.9°F | Resp 16 | Ht 64.0 in | Wt 155.2 lb

## 2019-09-13 DIAGNOSIS — R002 Palpitations: Secondary | ICD-10-CM

## 2019-09-13 DIAGNOSIS — R739 Hyperglycemia, unspecified: Secondary | ICD-10-CM | POA: Diagnosis not present

## 2019-09-13 DIAGNOSIS — R079 Chest pain, unspecified: Secondary | ICD-10-CM | POA: Diagnosis not present

## 2019-09-13 DIAGNOSIS — M79642 Pain in left hand: Secondary | ICD-10-CM | POA: Diagnosis not present

## 2019-09-13 DIAGNOSIS — N183 Chronic kidney disease, stage 3 unspecified: Secondary | ICD-10-CM

## 2019-09-13 DIAGNOSIS — M720 Palmar fascial fibromatosis [Dupuytren]: Secondary | ICD-10-CM

## 2019-09-13 DIAGNOSIS — I10 Essential (primary) hypertension: Secondary | ICD-10-CM

## 2019-09-13 DIAGNOSIS — E785 Hyperlipidemia, unspecified: Secondary | ICD-10-CM

## 2019-09-13 LAB — COMPREHENSIVE METABOLIC PANEL
ALT: 9 U/L (ref 0–35)
AST: 14 U/L (ref 0–37)
Albumin: 4.2 g/dL (ref 3.5–5.2)
Alkaline Phosphatase: 46 U/L (ref 39–117)
BUN: 21 mg/dL (ref 6–23)
CO2: 29 mEq/L (ref 19–32)
Calcium: 9.9 mg/dL (ref 8.4–10.5)
Chloride: 101 mEq/L (ref 96–112)
Creatinine, Ser: 0.82 mg/dL (ref 0.40–1.20)
GFR: 67.54 mL/min (ref >60.00)
Glucose, Bld: 96 mg/dL (ref 70–99)
Potassium: 3.7 mEq/L (ref 3.5–5.1)
Sodium: 139 mEq/L (ref 135–145)
Total Bilirubin: 0.4 mg/dL (ref 0.2–1.2)
Total Protein: 6.5 g/dL (ref 6.0–8.3)

## 2019-09-13 LAB — LIPID PANEL
Cholesterol: 244 mg/dL — ABNORMAL HIGH (ref 0–200)
HDL: 43.1 mg/dL (ref >39.00)
NonHDL: 201.05
Total CHOL/HDL Ratio: 6
Triglycerides: 209 mg/dL — ABNORMAL HIGH (ref 0.0–149.0)
VLDL: 41.8 mg/dL — ABNORMAL HIGH (ref 0.0–40.0)

## 2019-09-13 LAB — LDL CHOLESTEROL, DIRECT: Direct LDL: 157 mg/dL

## 2019-09-13 LAB — CBC
HCT: 41.9 % (ref 36.0–46.0)
Hemoglobin: 14.2 g/dL (ref 12.0–15.0)
MCHC: 33.8 g/dL (ref 30.0–36.0)
MCV: 88.9 fl (ref 78.0–100.0)
Platelets: 242 10*3/uL (ref 150.0–400.0)
RBC: 4.71 Mil/uL (ref 3.87–5.11)
RDW: 13.3 % (ref 11.5–15.5)
WBC: 6.2 10*3/uL (ref 4.0–10.5)

## 2019-09-13 LAB — HEMOGLOBIN A1C: Hgb A1c MFr Bld: 6.1 % (ref 4.6–6.5)

## 2019-09-13 LAB — TSH: TSH: 2.83 u[IU]/mL (ref 0.35–4.50)

## 2019-09-13 MED ORDER — DIAZEPAM 2 MG PO TABS: 2.0000 mg | ORAL_TABLET | Freq: Four times a day (QID) | ORAL | 0 refills | Status: DC | PRN

## 2019-09-13 NOTE — Assessment & Plan Note (Signed)
Well controlled, no changes to meds. Encouraged heart healthy diet such as the DASH diet and exercise as tolerated.  °

## 2019-09-13 NOTE — Assessment & Plan Note (Signed)
Encouraged heart healthy diet, increase exercise, avoid trans fats, consider a krill oil cap daily 

## 2019-09-13 NOTE — Assessment & Plan Note (Signed)
She is noting increased episodes of chest pain with some sob and fatigue at times. EKG shows PVCs and some ST changes. She is referred to cardiology for further consideration. She has been under a great deal of stress and she is upset discussing her EKG, she is allowed Diazepam 1-2 mg daily prn.

## 2019-09-13 NOTE — Assessment & Plan Note (Signed)
Noted in both palms and she notes worsening symptoms on left mostly notably over thumb recently. She declines referral to hand surgeon at this time but will report if worsens.

## 2019-09-13 NOTE — Progress Notes (Signed)
Subjective:    Patient ID: Laura [name], female    DOB: May 22, 1941, 79 y.o.   MRN: [id number]  Chief Complaint  Patient presents with  . Hypertension    Here for follow up  . Hand Pain    left hand pain    HPI Patient is in today for follow up on chronic medical concerns. No recent febrile illness or hospitalizations. She has noted increased episodes of chst pain and malaiise. Denies palp/SOB/HA/congestion/fevers/GI or GU c/o. Taking meds as prescribed  Past Medical History:  Diagnosis Date  . Adenomatous colon polyp 03/2012  . Cancer ([place])    basal on back and nose  . Cataract   . Chicken pox as a child  . CKD (chronic kidney disease), stage III ([place]) 02/21/2017  . DDD (degenerative disc disease), cervical   . EBV infection    mono as child  . Fibrocystic breast 05/01/2014  . GERD (gastroesophageal reflux disease)    pt denies  . Hyperlipidemia   . Hypertension 20 yrs ago  . Measles as a child  . Medicare annual wellness visit, subsequent 02/01/2015  . Mumps as a child  . Neck pain 01/22/2016  . Other and unspecified hyperlipidemia 10/13/2013  . Overweight(278.02)   . PONV (postoperative nausea and vomiting)    pt would like something to prevent nausea  . Vasovagal reaction 07/15/2015   no syncope but close     Past Surgical History:  Procedure Laterality Date  . ABDOMINAL HYSTERECTOMY  29 yrs ago   heavy bleeding, ovaries left in place  . ANTERIOR CERVICAL DECOMP/DISCECTOMY FUSION N/A 05/15/2017   Procedure: ANTERIOR CERVICAL DECOMPRESSION/DISCECTOMY FUSION CERVICL THREE CERVICAL FOUR, CERVICAL FOUR- CERVICAL FIVE;  Surgeon: Ashok Pall, MD;  Location: [facility];  Service: Neurosurgery;  Laterality: N/A;  . 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 COLONOSCOPY    . EYE SURGERY  11-27-2010   cataract surgery in both eyes  . POLYPECTOMY    . VAGINAL HYSTERECTOMY  79 yrs old   uterus removed    Family History  Problem  Relation Age of Onset  . Heart disease Father   . Heart attack Father 15  . Hypertension Father   . Dementia Maternal Grandmother   . Heart disease Maternal Grandfather   . Hyperlipidemia Paternal Grandmother   . Cancer Paternal Grandfather        stomach  . Heart attack Paternal Grandfather   . Stomach cancer Paternal Grandfather   . Dementia Mother   . Pulmonary embolism Son   . Vasculitis Son   . Pyruvate dehydrogenase deficiency Son   . Other Son        vasculitis led to PE  . Colon cancer Neg Hx   . Colon polyps Neg Hx   . Rectal cancer Neg Hx     Social History   Socioeconomic History  . Marital status: Married    Spouse name: Not on file  . Number of children: Not on file  . Years of education: Not on file  . Highest education level: Not on file  Occupational History  . Not on file  Tobacco Use  . Smoking status: Former Smoker    Packs/day: 1.00    Years: 16.00    Pack years: 16.00    Types: Cigarettes    Start date: 07/29/1972  . Smokeless tobacco: Never Used  Substance and Sexual Activity  . Alcohol use: No    Alcohol/week:  0.0 standard drinks  . Drug use: No  . Sexual activity: Never    Comment: lives with husband no major dietary restrictions  Other Topics Concern  . Not on file  Social History Narrative  . Not on file   Social Determinants of Health   Financial Resource Strain:   . Difficulty of Paying Living Expenses: Not on file  Food Insecurity:   . Worried About Charity fundraiser in the Last Year: Not on file  . Ran Out of Food in the Last Year: Not on file  Transportation Needs:   . Lack of Transportation (Medical): Not on file  . Lack of Transportation (Non-Medical): Not on file  Physical Activity:   . Days of Exercise per Week: Not on file  . Minutes of Exercise per Session: Not on file  Stress:   . Feeling of Stress : Not on file  Social Connections:   . Frequency of Communication with Friends and Family: Not on file  . Frequency  of Social Gatherings with Friends and Family: Not on file  . Attends Religious Services: Not on file  . Active Member of Clubs or Organizations: Not on file  . Attends Archivist Meetings: Not on file  . Marital Status: Not on file  Intimate Partner Violence:   . Fear of Current or Ex-Partner: Not on file  . Emotionally Abused: Not on file  . Physically Abused: Not on file  . Sexually Abused: Not on file    Outpatient Medications Prior to Visit  Medication Sig Dispense Refill  . acetaminophen (TYLENOL) 500 MG tablet Take 500 mg by mouth 2 (two) times daily as needed for mild pain.     . cholecalciferol (VITAMIN D) 1000 units tablet Take 1,000 Units by mouth daily.    . famotidine (PEPCID) 40 MG tablet Take 1 tablet (40 mg total) by mouth daily as needed for heartburn or indigestion. 30 tablet 2  . losartan-hydrochlorothiazide (HYZAAR) 50-12.5 MG tablet Take 1 tablet by mouth daily. 90 tablet 1  . naproxen (NAPROSYN) 500 MG tablet Take 500 mg by mouth at bedtime.   1   No facility-administered medications prior to visit.    Allergies  Allergen Reactions  . Lisinopril Other (See Comments)    dizzy  . Statins Other (See Comments)    Muscle weakness   . Sulfa Antibiotics Other (See Comments)    "feels feverish"  . Tetracyclines & Related Other (See Comments)    GI upset  . Benadryl [Diphenhydramine] Other (See Comments)    Urinary incontinence stopped when med discontinued    Review of Systems  Constitutional: Negative for fever and malaise/fatigue.  HENT: Negative for congestion.   Eyes: Negative for blurred vision.  Respiratory: Positive for shortness of breath.   Cardiovascular: Positive for chest pain. Negative for palpitations and leg swelling.  Gastrointestinal: Negative for abdominal pain, blood in stool and nausea.  Genitourinary: Negative for dysuria and frequency.  Musculoskeletal: Negative for falls.  Skin: Negative for rash.  Neurological: Negative  for dizziness, loss of consciousness and headaches.  Endo/Heme/Allergies: Negative for environmental allergies.  Psychiatric/Behavioral: Negative for depression. The patient is not nervous/anxious.        Objective:    Physical Exam  BP 134/74 (BP Location: Left Arm, Patient Position: Sitting, Cuff Size: Small)   Pulse 73   Temp (!) 96.9 F (36.1 C) (Temporal)   Resp 16   Ht 5\' 4"  (1.626 m)   Wt 155 lb  3.2 oz (70.4 kg)   SpO2 100%   BMI 26.64 kg/m  Wt Readings from Last 3 Encounters:  09/13/19 155 lb 3.2 oz (70.4 kg)  02/12/18 166 lb (75.3 kg)  09/14/17 165 lb (74.8 kg)    Diabetic Foot Exam - Simple   No data filed     Lab Results  Component Value Date   WBC 6.2 09/13/2019   HGB 14.2 09/13/2019   HCT 41.9 09/13/2019   PLT 242.0 09/13/2019   GLUCOSE 96 09/13/2019   CHOL 244 (H) 09/13/2019   TRIG 209.0 (H) 09/13/2019   HDL 43.10 09/13/2019   LDLDIRECT 157.0 09/13/2019   LDLCALC 161 (H) 06/18/2019   ALT 9 09/13/2019   AST 14 09/13/2019   NA 139 09/13/2019   K 3.7 09/13/2019   CL 101 09/13/2019   CREATININE 0.82 09/13/2019   BUN 21 09/13/2019   CO2 29 09/13/2019   TSH 2.83 09/13/2019   HGBA1C 6.1 09/13/2019    Lab Results  Component Value Date   TSH 2.83 09/13/2019   Lab Results  Component Value Date   WBC 6.2 09/13/2019   HGB 14.2 09/13/2019   HCT 41.9 09/13/2019   MCV 88.9 09/13/2019   PLT 242.0 09/13/2019   Lab Results  Component Value Date   NA 139 09/13/2019   K 3.7 09/13/2019   CO2 29 09/13/2019   GLUCOSE 96 09/13/2019   BUN 21 09/13/2019   CREATININE 0.82 09/13/2019   BILITOT 0.4 09/13/2019   ALKPHOS 46 09/13/2019   AST 14 09/13/2019   ALT 9 09/13/2019   PROT 6.5 09/13/2019   ALBUMIN 4.2 09/13/2019   CALCIUM 9.9 09/13/2019   ANIONGAP 10 05/09/2017   GFR 67.54 09/13/2019   Lab Results  Component Value Date   CHOL 244 (H) 09/13/2019   Lab Results  Component Value Date   HDL 43.10 09/13/2019   Lab Results  Component Value  Date   LDLCALC 161 (H) 06/18/2019   Lab Results  Component Value Date   TRIG 209.0 (H) 09/13/2019   Lab Results  Component Value Date   CHOLHDL 6 09/13/2019   Lab Results  Component Value Date   HGBA1C 6.1 09/13/2019       Assessment & Plan:   Problem List Items Addressed This Visit    Chest pain    She is noting increased episodes of chest pain with some sob and fatigue at times. EKG shows PVCs and some ST changes. She is referred to cardiology for further consideration. She has been under a great deal of stress and she is upset discussing her EKG, she is allowed Diazepam 1-2 mg daily prn.       Hypertension    Well controlled, no changes to meds. Encouraged heart healthy diet such as the DASH diet and exercise as tolerated.       Relevant Orders   CBC (Completed)   TSH (Completed)   Ambulatory referral to Cardiology   HLD (hyperlipidemia)    Encouraged heart healthy diet, increase exercise, avoid trans fats, consider a krill oil cap daily      Relevant Orders   CBC (Completed)   Lipid panel (Completed)   Ambulatory referral to Cardiology   CKD (chronic kidney disease), stage III    Hydrate and monitor       Other Visit Diagnoses    Hyperglycemia    -  Primary   Relevant Orders   Hemoglobin A1c (Completed)   Comprehensive metabolic panel (Completed)  Ambulatory referral to Cardiology   Left hand pain       Relevant Orders   Rheumatoid Factor   Antinuclear Antib (ANA)   Palpitations       Relevant Orders   EKG 12-Lead (Completed)   Ambulatory referral to Cardiology      I am having Laura [name] start on diazepam. I am also having her maintain her naproxen, cholecalciferol, acetaminophen, losartan-hydrochlorothiazide, and famotidine.  Meds ordered this encounter  Medications  . diazepam (VALIUM) 2 MG tablet    Sig: Take 1 tablet (2 mg total) by mouth every 6 (six) hours as needed for anxiety.    Dispense:  30 tablet    Refill:  0      Penni Homans, MD

## 2019-09-13 NOTE — Assessment & Plan Note (Signed)
Hydrate and monitor 

## 2019-09-13 NOTE — Patient Instructions (Addendum)
[employer] Blood Pressure cuff, upper arm, want BP 100-140/60-90 Pulse oximeter, want oxygen in 90s  Weekly vitals  Take Multivitamin with minerals, selenium Vitamin D 1000-2000 IU daily Probiotic with lactobacillus and bifidophilus Asprin EC 81 mg daily  Hypertension, Adult Hypertension is another name for high blood pressure. High blood pressure forces your heart to work harder to pump blood. This can cause problems over time. There are two numbers in a blood pressure reading. There is a top number (systolic) over a bottom number (diastolic). It is best to have a blood pressure that is below 120/80. Healthy choices can help lower your blood pressure, or you may need medicine to help lower it. What are the causes? The cause of this condition is not known. Some conditions may be related to high blood pressure. What increases the risk?  Smoking.  Having type 2 diabetes mellitus, high cholesterol, or both.  Not getting enough exercise or physical activity.  Being overweight.  Having too much fat, sugar, calories, or salt (sodium) in your diet.  Drinking too much alcohol.  Having long-term (chronic) kidney disease.  Having a family history of high blood pressure.  Age. Risk increases with age.  Race. You may be at higher risk if you are African American.  Gender. Men are at higher risk than women before age 53. After age 81, women are at higher risk than men.  Having obstructive sleep apnea.  Stress. What are the signs or symptoms?  High blood pressure may not cause symptoms. Very high blood pressure (hypertensive crisis) may cause: ? Headache. ? Feelings of worry or nervousness (anxiety). ? Shortness of breath. ? Nosebleed. ? A feeling of being sick to your stomach (nausea). ? Throwing up (vomiting). ? Changes in how you see. ? Very bad chest pain. ? Seizures. How is this treated?  This condition is treated by making healthy lifestyle changes, such as: ? Eating  healthy foods. ? Exercising more. ? Drinking less alcohol.  Your health care provider may prescribe medicine if lifestyle changes are not enough to get your blood pressure under control, and if: ? Your top number is above 130. ? Your bottom number is above 80.  Your personal target blood pressure may vary. Follow these instructions at home: Eating and drinking   If told, follow the DASH eating plan. To follow this plan: ? Fill one half of your plate at each meal with fruits and vegetables. ? Fill one fourth of your plate at each meal with whole grains. Whole grains include whole-wheat pasta, brown rice, and whole-grain bread. ? Eat or drink low-fat dairy products, such as skim milk or low-fat yogurt. ? Fill one fourth of your plate at each meal with low-fat (lean) proteins. Low-fat proteins include fish, chicken without skin, eggs, beans, and tofu. ? Avoid fatty meat, cured and processed meat, or chicken with skin. ? Avoid pre-made or processed food.  Eat less than 1,500 mg of salt each day.  Do not drink alcohol if: ? Your doctor tells you not to drink. ? You are pregnant, may be pregnant, or are planning to become pregnant.  If you drink alcohol: ? Limit how much you use to:  0-1 drink a day for women.  0-2 drinks a day for men. ? Be aware of how much alcohol is in your drink. In the [place] one drink equals one 12 oz bottle of beer (355 mL), one 5 oz glass of wine (148 mL), or one 1 oz glass of  hard liquor (44 mL). Lifestyle   Work with your doctor to stay at a healthy weight or to lose weight. Ask your doctor what the best weight is for you.  Get at least 30 minutes of exercise most days of the week. This may include walking, swimming, or biking.  Get at least 30 minutes of exercise that strengthens your muscles (resistance exercise) at least 3 days a week. This may include lifting weights or doing Pilates.  Do not use any products that contain nicotine or tobacco, such  as cigarettes, e-cigarettes, and chewing tobacco. If you need help quitting, ask your doctor.  Check your blood pressure at home as told by your doctor.  Keep all follow-up visits as told by your doctor. This is important. Medicines  Take over-the-counter and prescription medicines only as told by your doctor. Follow directions carefully.  Do not skip doses of blood pressure medicine. The medicine does not work as well if you skip doses. Skipping doses also puts you at risk for problems.  Ask your doctor about side effects or reactions to medicines that you should watch for. Contact a doctor if you:  Think you are having a reaction to the medicine you are taking.  Have headaches that keep coming back (recurring).  Feel dizzy.  Have swelling in your ankles.  Have trouble with your vision. Get help right away if you:  Get a very bad headache.  Start to feel mixed up (confused).  Feel weak or numb.  Feel faint.  Have very bad pain in your: ? Chest. ? Belly (abdomen).  Throw up more than once.  Have trouble breathing. Summary  Hypertension is another name for high blood pressure.  High blood pressure forces your heart to work harder to pump blood.  For most people, a normal blood pressure is less than 120/80.  Making healthy choices can help lower blood pressure. If your blood pressure does not get lower with healthy choices, you may need to take medicine. This information is not intended to replace advice given to you by your health care provider. Make sure you discuss any questions you have with your health care provider. Document Revised: 03/25/2018 Document Reviewed: 03/25/2018 Elsevier Patient Education  [place].  Melatonin 2-5 mg at bedtime  [email or web address]  60-80 ounces

## 2019-09-14 ENCOUNTER — Ambulatory Visit (INDEPENDENT_AMBULATORY_CARE_PROVIDER_SITE_OTHER): Payer: Medicare Other | Admitting: Cardiology

## 2019-09-14 ENCOUNTER — Encounter: Payer: Self-pay | Admitting: Cardiology

## 2019-09-14 ENCOUNTER — Encounter: Payer: Self-pay | Admitting: *Deleted

## 2019-09-14 VITALS — BP 160/86 | HR 81 | Ht 64.0 in | Wt 155.2 lb

## 2019-09-14 DIAGNOSIS — I493 Ventricular premature depolarization: Secondary | ICD-10-CM | POA: Diagnosis not present

## 2019-09-14 DIAGNOSIS — I10 Essential (primary) hypertension: Secondary | ICD-10-CM | POA: Diagnosis not present

## 2019-09-14 DIAGNOSIS — R072 Precordial pain: Secondary | ICD-10-CM

## 2019-09-14 DIAGNOSIS — R9431 Abnormal electrocardiogram [ECG] [EKG]: Secondary | ICD-10-CM | POA: Insufficient documentation

## 2019-09-14 NOTE — Progress Notes (Signed)
Patient ID: Laura [name], female   DOB: [birth year], 79 y.o.   MRN: [id number] Patient enrolled for [employer] to mail a 3 day ZIO XT long term holter monitor to her home.

## 2019-09-14 NOTE — Progress Notes (Signed)
Primary Care Provider: [name], MD Cardiologist: Glenetta Hew, MD Electrophysiologist: None   Husband is a patient of Dr. Stanford Breed.  Clinic Note: Chief Complaint  Patient presents with  . New Patient (Initial Visit)    Abnormal EKG  . Palpitations    PVCs on EKG    HPI:    Laura [name] is a 78 y.o. female with extensive past medical history noted below who is being seen today for the evaluation of EPISODES OF CHEST PAIN AND MALAISE at the request of [name], MD.  Laura [name] was just seen on 09/13/2019 by Dr. Charlett Blake for routine follow-up with noted increased episodes of chest pain and malaise.  No dyspnea or palpitations. -->  She is noted to have a family history of CAD on both her father and mother side (see family history) and herself has hypertension and hyperlipidemia as well as tobacco use..  EKG showed PVCs with questionable ST changes.  She was referred for cardiology evaluation.  She has been seen in inpatient consultation for chest pain dyspnea back in July 2018.  Echocardiogram done in the hospital was within normal and she had an outpatient stress test scheduled.  Based on the fact that was read as completely normal, she chose to cancel her follow-up appointment and that was scheduled with Dr. Stanford Breed.  She never did follow-up with cardiology.   Recent Hospitalizations: None  Reviewed  CV studies:    The following studies were reviewed today: (if available, images/films reviewed: From Epic Chart or Care Everywhere) . 02/21/2017 -- TTE : EF 60 to 65%.  GR 1 DD.  Normal wall motion.  Normal atrial sizes.  Mildly dilated IVC.  03/10/2017-- Myoview Stress Test: EF 65-70%.  Exercise 4:30 min (4.6 METS)-she reached peak heart rate 146 bpm which is 96% max protected.  No EKG changes.  Normal wall motion. No evidence of ischemia or infarction.   Interval History:   Laura [name] presents here today for cardiology very evaluation indicating that she  never recalls mentioning having any chest tightness or discomfort.  What she describes as an irregular sensation in her chest with off-and-on palpitations.  These usually increase in frequency and duration when she is more active or when she is more excited or has any emotional stress.  When she has several of them, she may feel a little bit of an unusual sensation at the end where she feels like she may get a little dizzy but no actual chest pain.  Is more but unusual sensation.  She does not have any syncope or near syncope symptoms.  She has not had any resting exertional chest pain.  She says that these palpitations do improve with activity and get worse with caffeine or chocolate.  She is significant down her chocolate and caffeine intake now on the palpitations seem to be improving.  CV Review of Symptoms (Summary) Cardiovascular ROS: no chest pain or dyspnea on exertion positive for - irregular heartbeat, palpitations and Not fast rates. negative for - orthopnea, paroxysmal nocturnal dyspnea, rapid heart rate, shortness of breath or Syncope/near syncope, TIA/amaurosis fugax, claudication  The patient does not have symptoms concerning for COVID-19 infection (fever, chills, cough, or new shortness of breath).  The patient is practicing social distancing & Masking.    REVIEWED OF SYSTEMS   A comprehensive ROS was performed. Review of Systems  Constitutional: Negative for malaise/fatigue (Not full of energy, but otherwise doing okay.) and weight loss.  HENT: Negative for congestion and nosebleeds.   Cardiovascular: Negative for chest pain.  Gastrointestinal: Negative for blood in stool, heartburn and melena.  Genitourinary: Negative for hematuria.  Musculoskeletal: Positive for joint pain. Negative for falls.  Neurological: Negative for dizziness, focal weakness and headaches.  Psychiatric/Behavioral: Negative for depression and memory loss. The patient is not nervous/anxious.     I have  reviewed and (if needed) personally updated the patient's problem list, medications, allergies, past medical and surgical history, social and family history.   PAST MEDICAL HISTORY   Past Medical History:  Diagnosis Date  . Adenomatous colon polyp 03/2012  . Cancer ([place])    basal on back and nose  . Cataract   . Chicken pox as a child  . CKD (chronic kidney disease), stage III 02/21/2017  . DDD (degenerative disc disease), cervical   . EBV infection    mono as child  . Fibrocystic breast 05/01/2014  . GERD (gastroesophageal reflux disease)    pt denies  . Hyperlipidemia   . Hypertension 20 yrs ago  . Measles as a child  . Medicare annual wellness visit, subsequent 02/01/2015  . Mumps as a child  . Neck pain 01/22/2016  . Other and unspecified hyperlipidemia 10/13/2013  . Overweight(278.02)   . PONV (postoperative nausea and vomiting)    pt would like something to prevent nausea  . Vasovagal reaction 07/15/2015   no syncope but close     PAST SURGICAL HISTORY   Past Surgical History:  Procedure Laterality Date  . ABDOMINAL HYSTERECTOMY  30 yrs ago   heavy bleeding, ovaries left in place  . ANTERIOR CERVICAL DECOMP/DISCECTOMY FUSION N/A 05/15/2017   Procedure: ANTERIOR CERVICAL DECOMPRESSION/DISCECTOMY FUSION CERVICL THREE CERVICAL FOUR, CERVICAL FOUR- CERVICAL FIVE;  Surgeon: Ashok Pall, MD;  Location: [facility];  Service: Neurosurgery;  Laterality: N/A;  . 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 COLONOSCOPY    . EYE SURGERY  11-27-2010   cataract surgery in both eyes  . POLYPECTOMY    . VAGINAL HYSTERECTOMY  79 yrs old   uterus removed    MEDICATIONS/ALLERGIES   Current Meds  Medication Sig  . acetaminophen (TYLENOL) 500 MG tablet Take 500 mg by mouth 2 (two) times daily as needed for mild pain.   . cholecalciferol (VITAMIN D) 1000 units tablet Take 1,000 Units by mouth daily.  . diazepam (VALIUM) 2 MG tablet Take 1 tablet (2 mg  total) by mouth every 6 (six) hours as needed for anxiety.  . famotidine (PEPCID) 40 MG tablet Take 1 tablet (40 mg total) by mouth daily as needed for heartburn or indigestion.  Marland Kitchen losartan-hydrochlorothiazide (HYZAAR) 50-12.5 MG tablet Take 1 tablet by mouth daily.  . naproxen (NAPROSYN) 500 MG tablet Take 500 mg by mouth at bedtime.     Allergies  Allergen Reactions  . Lisinopril Other (See Comments)    dizzy  . Statins Other (See Comments)    Muscle weakness   . Sulfa Antibiotics Other (See Comments)    "feels feverish"  . Tetracyclines & Related Other (See Comments)    GI upset  . Benadryl [Diphenhydramine] Other (See Comments)    Urinary incontinence stopped when med discontinued  Notes intolerance to statins  SOCIAL HISTORY/FAMILY HISTORY   Social History   Tobacco Use  . Smoking status: Former Smoker    Packs/day: 1.00    Years: 16.00    Pack years: 16.00    Types: Cigarettes  Start date: 07/29/1972  . Smokeless tobacco: Never Used  Substance Use Topics  . Alcohol use: No    Alcohol/week: 0.0 standard drinks  . Drug use: No   Social History   Social History Narrative  . Not on file    Family History family history includes Cancer in her paternal grandfather; Dementia in her maternal grandmother and mother; Heart attack in her paternal grandfather; Heart attack (age of onset: 56) in her father; Heart disease in her father and maternal grandfather; Hyperlipidemia in her paternal grandmother; Hypertension in her father; Other in her son; Pulmonary embolism in her son; Pyruvate dehydrogenase deficiency in her son; Stomach cancer in her paternal grandfather; Vasculitis in her son.  OBJCTIVE -PE, EKG, labs   Wt Readings from Last 3 Encounters:  09/14/19 155 lb 3.2 oz (70.4 kg)  09/13/19 155 lb 3.2 oz (70.4 kg)  02/12/18 166 lb (75.3 kg)    Physical Exam: BP (!) 160/86   Pulse 81   Ht 5\' 4"  (1.626 m)   Wt 155 lb 3.2 oz (70.4 kg)   SpO2 99%   BMI 26.64  kg/m  Physical Exam  Constitutional: She is oriented to person, place, and time. She appears well-developed and well-nourished. No distress.  Healthy-appearing.  Well-groomed.  HENT:  Head: Normocephalic and atraumatic.  Eyes: Conjunctivae and EOM are normal.  Neck: No hepatojugular reflux and no JVD present. Carotid bruit is not present.  Cardiovascular: Normal rate, regular rhythm, normal heart sounds and intact distal pulses.  Occasional extrasystoles are present. PMI is not displaced. Exam reveals no gallop and no friction rub.  No murmur heard. Pulmonary/Chest: Effort normal and breath sounds normal. No respiratory distress. She has no wheezes. She has no rales.  Abdominal: Soft. Bowel sounds are normal. She exhibits no distension. There is no abdominal tenderness. There is no rebound.  No HSM.  Musculoskeletal:        General: Normal range of motion.     Cervical back: Normal range of motion and neck supple.  Neurological: She is alert and oriented to person, place, and time.  Psychiatric: She has a normal mood and affect. Her behavior is normal. Judgment and thought content normal.  Vitals reviewed.    Adult ECG Report Review of EKG from PCPs office. Rate: 81;  Rhythm: normal sinus rhythm, premature ventricular contractions (PVC) and Artifact in V4 and V5 make these leads unintelligible.  There is a subtle scooped ST segment in V6 consistent with previous EKGs.;  No evidence to suggest prior infarct or ischemia.  Normal R wave progression.  Normal axis, intervals and durations.  Narrative Interpretation: Essentially normal EKG with exception of PVCs.  Recent Labs:   Lab Results  Component Value Date   CHOL 244 (H) 09/13/2019   HDL 43.10 09/13/2019   LDLCALC 161 (H) 06/18/2019   LDLDIRECT 157.0 09/13/2019   TRIG 209.0 (H) 09/13/2019   CHOLHDL 6 09/13/2019   Lab Results  Component Value Date   CREATININE 0.82 09/13/2019   BUN 21 09/13/2019   NA 139 09/13/2019   K 3.7  09/13/2019   CL 101 09/13/2019   CO2 29 09/13/2019   Lab Results  Component Value Date   TSH 2.83 09/13/2019    ASSESSMENT/PLAN    Problem List Items Addressed This Visit    Hypertension - Primary (Chronic)    Blood pressure has been pretty well controlled on current dose of losartan-HCTZ.  She is hypertensive here today, but this is probably related  to her being very anxious showing up to the cardiology office.  She is extremely stressed out being told that she has possibly had a prior heart attack.  For now simply continue losartan of HCTZ, but if we are looking to further treat blood pressure, and option of either nondehydrating calcium channel blocker (diltiazem) or beta-blocker would be a good first choice.      Premature ventricular contractions (PVCs) (VPCs)    She did have PVCs noted on her EKG at her PCPs office and did have some ectopy on exam today.  These do not seem to be all that frequent, and seem to be improving in duration and symptom.  Would like to determine her overall PVC burden since they are happening most days.  Symptoms seem to be alleviated with Valium.  We could potentially have a similar effect of beta-blocker.  Plan: 3-day Zio patch monitor.  In the absence of true symptoms, probably would not treat.  But she does have a combination of blood pressure and heart rate room to add a new medication if necessary.      Relevant Orders   LONG TERM MONITOR (3-14 DAYS)   Chest pain    She did not mention anything to me about having any shortness of breath or fatigue.  What she describes having a sensation of irregular feeling in her chest with the PVCs.  I asked her several times and she said that she did not have chest pain or dyspnea.  She says that the palpitations are improved as is the clinical discomfort in her chest with the Valium.  For now, unless she does truthfully mention to me symptoms of chest tightness pressure dyspnea/fatigue would hold off on  doing a type of stress test.  She was tested about 2 and half years ago with Myoview stress test all it indicated was reduced exercise tolerance.      Relevant Orders   LONG TERM MONITOR (3-14 DAYS)   Nonspecific abnormal electrocardiogram (ECG) (EKG)    My review the EKG shows shows it is relatively stable compared to prior EKGs.  There is artifact in 2 leads making the 4 and V5 unintelligible.  However the subtle little scooped out ST segment in the lateral leads was present on previous EKGs.  This is not indicative of prior infarct or ischemia.  We will continue to monitor.      Relevant Orders   LONG TERM MONITOR (3-14 DAYS)       COVID-19 Education: The signs and symptoms of COVID-19 were discussed with the patient and how to seek care for testing (follow up with PCP or arrange E-visit).   The importance of social distancing was discussed today.  I spent a total of 18 minutes with the patient. >  50% of the time was spent in direct patient consultation.  Additional time spent with chart review  / charting (studies, outside notes, etc): 6 Total Time: 24 min   Current medicines are reviewed at length with the patient today.  (+/- concerns) n/a   Patient Instructions / Medication Changes & Studies & Tests Ordered   Patient Instructions  Medication Instructions:  No changes *If you need a refill on your cardiac medications before your next appointment, please call your pharmacy*  Lab Work: Not needed  Testing/Procedures: Will be mailed to you --Your physician has recommended that you wear a 3  DAY ZIO-PATCH monitor. The Zio patch cardiac monitor continuously records heart rhythm data for up to  14 days, this is for patients being evaluated for multiple types heart rhythms. For the first 24 hours post application, please avoid getting the Zio monitor wet in the shower or by excessive sweating during exercise. After that, feel free to carry on with regular activities. Keep soaps  and lotions away from the ZIO XT Patch.  This will be mailed to you, please expect 7-10 days to receive.  [employer] location - [place], [address].       Follow-Up: At [facility], you and your health needs are our priority.  As part of our continuing mission to provide you with exceptional heart care, we have created designated Provider Care Teams.  These Care Teams include your primary Cardiologist (physician) and Advanced Practice Providers (APPs -  Physician Assistants and Nurse Practitioners) who all work together to provide you with the care you need, when you need it.  Your next appointment:   7 week(s)  The format for your next appointment:   Virtual Visit   Provider:   Glenetta Hew, MD  Other Instructions   ZIO XT- Long Term Monitor Instructions  --Standard instructions provided    Studies Ordered:   Orders Placed This Encounter  Procedures  . LONG TERM MONITOR (3-14 DAYS)     Glenetta Hew, M.D., M.S. Interventional Cardiologist   Pager # [phone] Phone # [phone] [address] [place], [place] [address]   Thank you for choosing Heartcare at [facility]!!

## 2019-09-14 NOTE — Patient Instructions (Signed)
Medication Instructions:  No changes *If you need a refill on your cardiac medications before your next appointment, please call your pharmacy*  Lab Work: Not needed  Testing/Procedures: Will be mailed to you --Your physician has recommended that you wear a 3  DAY ZIO-PATCH monitor. The Zio patch cardiac monitor continuously records heart rhythm data for up to 14 days, this is for patients being evaluated for multiple types heart rhythms. For the first 24 hours post application, please avoid getting the Zio monitor wet in the shower or by excessive sweating during exercise. After that, feel free to carry on with regular activities. Keep soaps and lotions away from the ZIO XT Patch.  This will be mailed to you, please expect 7-10 days to receive.  [employer] location - [place], [address].        Follow-Up: At [facility], you and your health needs are our priority.  As part of our continuing mission to provide you with exceptional heart care, we have created designated Provider Care Teams.  These Care Teams include your primary Cardiologist (physician) and Advanced Practice Providers (APPs -  Physician Assistants and Nurse Practitioners) who all work together to provide you with the care you need, when you need it.  Your next appointment:   7 week(s)  The format for your next appointment:   Virtual Visit   Provider:   Glenetta Hew, MD  Other Instructions   ZIO XT- Long Term Monitor Instructions   Your physician has requested you wear your ZIO patch monitor___3____days.   This is a single patch monitor.  [employer] supplies one patch monitor per enrollment.  Additional stickers are not available.   Please do not apply patch if you will be having a Nuclear Stress Test, Echocardiogram, Cardiac CT, MRI, or Chest Xray during the time frame you would be wearing the monitor. The patch cannot be worn during these tests.  You cannot remove and re-apply the ZIO XT patch monitor.     Your ZIO patch monitor will be sent USPS Priority mail from [facility] directly to your home address. The monitor may also be mailed to a PO BOX if home delivery is not available.   It may take 3-5 days to receive your monitor after you have been enrolled.   Once you have received you monitor, please review enclosed instructions.  Your monitor has already been registered assigning a specific monitor serial # to you.   Applying the monitor   Shave hair from upper left chest.   Hold abrader disc by orange tab.  Rub abrader in 40 strokes over left upper chest as indicated in your monitor instructions.   Clean area with 4 enclosed alcohol pads .  Use all pads to assure are is cleaned thoroughly.  Let dry.   Apply patch as indicated in monitor instructions.  Patch will be place under collarbone on left side of chest with arrow pointing upward.   Rub patch adhesive wings for 2 minutes.Remove white label marked "1".  Remove white label marked "2".  Rub patch adhesive wings for 2 additional minutes.   While looking in a mirror, press and release button in center of patch.  A small green light will flash 3-4 times .  This will be your only indicator the monitor has been turned on.     Do not shower for the first 24 hours.  You may shower after the first 24 hours.   Press button if you  feel a symptom. You will hear a small click.  Record Date, Time and Symptom in the Patient Log Book.   When you are ready to remove patch, follow instructions on last 2 pages of Patient Log Book.  Stick patch monitor onto last page of Patient Log Book.   Place Patient Log Book in Gas box.  Use locking tab on box and tape box closed securely.  The [employer] and [employer] has [employer] on it.  Please place in mailbox as soon as possible.  Your physician should have your test results approximately 7 days after the monitor has been mailed back to [facility].   Call [place] [facility] [place] at  [phone] if you have questions regarding your ZIO XT patch monitor.  Call them immediately if you see an orange light blinking on your monitor.   If your monitor falls off in less than 4 days contact our Monitor department at [phone].  If your monitor becomes loose or falls off after 4 days call [employer] at [phone] for suggestions on securing your monitor.

## 2019-09-15 ENCOUNTER — Other Ambulatory Visit: Payer: Self-pay | Admitting: Family Medicine

## 2019-09-15 ENCOUNTER — Encounter: Payer: Self-pay | Admitting: Family Medicine

## 2019-09-15 ENCOUNTER — Telehealth: Payer: Self-pay

## 2019-09-15 DIAGNOSIS — M79641 Pain in right hand: Secondary | ICD-10-CM

## 2019-09-15 DIAGNOSIS — E785 Hyperlipidemia, unspecified: Secondary | ICD-10-CM

## 2019-09-15 DIAGNOSIS — M79642 Pain in left hand: Secondary | ICD-10-CM

## 2019-09-15 DIAGNOSIS — R768 Other specified abnormal immunological findings in serum: Secondary | ICD-10-CM

## 2019-09-15 DIAGNOSIS — Z789 Other specified health status: Secondary | ICD-10-CM

## 2019-09-15 LAB — ANA: Anti Nuclear Antibody (ANA): POSITIVE — AB

## 2019-09-15 LAB — ANTI-NUCLEAR AB-TITER (ANA TITER): ANA Titer 1: 1:160 {titer} — ABNORMAL HIGH

## 2019-09-15 LAB — RHEUMATOID FACTOR: Rheumatoid fact SerPl-aCnc: 16 IU/mL — ABNORMAL HIGH (ref <14)

## 2019-09-15 NOTE — Telephone Encounter (Signed)
SB-Pt is in agreement with both referrals [facility]  FYI: She wanted you to know that she saw Dr. Ellyn Hack with Cards and everything is fine/having her wear a monitory for 3d and if it comes back with too many beats he may consider adding a beta blocker/she states that you can contact her if you have any questions/thx dmf

## 2019-09-15 NOTE — Telephone Encounter (Signed)
Thanks both referrals placed

## 2019-09-15 NOTE — Telephone Encounter (Signed)
-----   Message from [name], MD sent at 09/14/2019  3:37 PM EST ----- Notify rheumatoid factor is elevated. Can refer to rheumatology for further evaluation if she would like

## 2019-09-16 ENCOUNTER — Ambulatory Visit: Payer: Medicare Other | Admitting: Cardiology

## 2019-09-16 ENCOUNTER — Encounter: Payer: Self-pay | Admitting: Cardiology

## 2019-09-16 NOTE — Assessment & Plan Note (Addendum)
She did not mention anything to me about having any shortness of breath or fatigue.  What she describes having a sensation of irregular feeling in her chest with the PVCs.  I asked her several times and she said that she did not have chest pain or dyspnea.  She says that the palpitations are improved as is the clinical discomfort in her chest with the Valium.  For now, unless she does truthfully mention to me symptoms of chest tightness pressure dyspnea/fatigue would hold off on doing a type of stress test.  She was tested about 2 and half years ago with Myoview stress test all it indicated was reduced exercise tolerance.

## 2019-09-16 NOTE — Assessment & Plan Note (Addendum)
She did have PVCs noted on her EKG at her PCPs office and did have some ectopy on exam today.  These do not seem to be all that frequent, and seem to be improving in duration and symptom.  Would like to determine her overall PVC burden since they are happening most days.  Symptoms seem to be alleviated with Valium.  We could potentially have a similar effect of beta-blocker.  Plan: 3-day Zio patch monitor.  In the absence of true symptoms, probably would not treat.  But she does have a combination of blood pressure and heart rate room to add a new medication if necessary.

## 2019-09-16 NOTE — Assessment & Plan Note (Signed)
Blood pressure has been pretty well controlled on current dose of losartan-HCTZ.  She is hypertensive here today, but this is probably related to her being very anxious showing up to the cardiology office.  She is extremely stressed out being told that she has possibly had a prior heart attack.  For now simply continue losartan of HCTZ, but if we are looking to further treat blood pressure, and option of either nondehydrating calcium channel blocker (diltiazem) or beta-blocker would be a good first choice.

## 2019-09-16 NOTE — Assessment & Plan Note (Signed)
My review the EKG shows shows it is relatively stable compared to prior EKGs.  There is artifact in 2 leads making the 4 and V5 unintelligible.  However the subtle little scooped out ST segment in the lateral leads was present on previous EKGs.  This is not indicative of prior infarct or ischemia.  We will continue to monitor.

## 2019-09-17 ENCOUNTER — Ambulatory Visit (INDEPENDENT_AMBULATORY_CARE_PROVIDER_SITE_OTHER): Payer: Medicare Other

## 2019-09-17 DIAGNOSIS — R072 Precordial pain: Secondary | ICD-10-CM | POA: Diagnosis not present

## 2019-09-17 DIAGNOSIS — I493 Ventricular premature depolarization: Secondary | ICD-10-CM

## 2019-09-17 DIAGNOSIS — R9431 Abnormal electrocardiogram [ECG] [EKG]: Secondary | ICD-10-CM | POA: Diagnosis not present

## 2019-09-27 ENCOUNTER — Other Ambulatory Visit: Payer: Self-pay | Admitting: Family Medicine

## 2019-09-28 DIAGNOSIS — R072 Precordial pain: Secondary | ICD-10-CM | POA: Diagnosis not present

## 2019-09-28 DIAGNOSIS — I493 Ventricular premature depolarization: Secondary | ICD-10-CM | POA: Diagnosis not present

## 2019-09-28 DIAGNOSIS — R9431 Abnormal electrocardiogram [ECG] [EKG]: Secondary | ICD-10-CM | POA: Diagnosis not present

## 2019-10-19 DIAGNOSIS — R768 Other specified abnormal immunological findings in serum: Secondary | ICD-10-CM | POA: Diagnosis not present

## 2019-10-19 DIAGNOSIS — M2550 Pain in unspecified joint: Secondary | ICD-10-CM | POA: Diagnosis not present

## 2019-10-22 ENCOUNTER — Other Ambulatory Visit: Payer: Self-pay

## 2019-10-22 ENCOUNTER — Encounter: Payer: Self-pay | Admitting: Cardiology

## 2019-10-22 ENCOUNTER — Ambulatory Visit (INDEPENDENT_AMBULATORY_CARE_PROVIDER_SITE_OTHER): Payer: Medicare Other | Admitting: Cardiology

## 2019-10-22 VITALS — BP 150/70 | HR 76 | Temp 96.8°F | Ht 64.0 in | Wt 155.6 lb

## 2019-10-22 DIAGNOSIS — I10 Essential (primary) hypertension: Secondary | ICD-10-CM

## 2019-10-22 DIAGNOSIS — I493 Ventricular premature depolarization: Secondary | ICD-10-CM | POA: Diagnosis not present

## 2019-10-22 DIAGNOSIS — R232 Flushing: Secondary | ICD-10-CM

## 2019-10-22 NOTE — Progress Notes (Signed)
Primary Care Provider: [name], MD Cardiologist: Glenetta Hew, MD Electrophysiologist: None   Husband is a patient of Dr. Stanford Breed.  Clinic Note: Chief Complaint  Patient presents with  . Follow-up    Monitor results.  . Palpitations    Present, did not really have that many when she was had her monitor on..  More anxiety than anything else.    HPI:    Laura [name] is a 79 y.o. female with extensive past medical history noted below who is being seen today for the evaluation of EPISODES OF CHEST PAIN AND MALAISE at the request of [name], MD.  Laura [name] was just seen on 09/13/2019 by Dr. Charlett Blake for routine follow-up with noted increased episodes of chest pain and malaise.  No dyspnea or palpitations. -->  She is noted to have a family history of CAD on both her father and mother side (see family history) and herself has hypertension and hyperlipidemia as well as tobacco use..  EKG showed PVCs with questionable ST changes.  She was referred for cardiology evaluation.  She has been seen in inpatient consultation for chest pain dyspnea back in July 2018.  Echocardiogram done in the hospital was within normal and she had an outpatient stress test scheduled.  Based on the fact that was read as completely normal, she chose to cancel her follow-up appointment and that was scheduled with Dr. Stanford Breed.  She never did follow-up with cardiology.   Recent Hospitalizations: None  Reviewed  CV studies:    The following studies were reviewed today: (if available, images/films reviewed: From Epic Chart or Care Everywhere)  Event Monitor: Predominant underlying rhythm was Sinus Rhythm -->min HR of 53 bpm, max HR of 127 bpm, and avg HR of 70bpm.  2 short runs of SVT-fastest and longest 102 bpm at 9 beats, rare (less than 1%) isolated PACs with minimal couplets and no triplets.  Also rare isolated PVCs (less than 1%) with even more rare couplets.  No triplets.  There was  ventricular trigeminy noted.   Interval History:   Laura [name] presents here today quite anxious as indicated by her blood pressure of 150/70.  She said this home her blood pressure at home this morning was 130/70 mmHg.  She tells me that she really has not had a lot of palpitations, did not have that many at all while wearing the monitor.  None of the spells that she noted.  However she did not push the button always wearing the monitor.  This would indicate that she did notice that brief runs of SVT.  She really has not had any chest tightness or pressure.  She is is very anxious.  She is not had any dizziness or lightheadedness.  No syncope or near syncope.  No resting or exertional chest pain or pressure.  No dyspnea.  She has noted that there was improvement in her palpitations with reducing triggers like caffeine and chocolate.  She then spent on time discussing her concerns about her baseline cardiovascular risk.  She is scheduled to see Dr. Debara Pickett in the lipid clinic for her elevated lipid levels and issues with statins.  We talked about concerning symptoms and features.  I explained that she has outlived most of the members of the family to have the premature coronary disease, and she herself has not had any symptoms.  We talked about different options of what she can do to evaluate this including test such as  high-res CRP and even coronary calcium scores as well as to do screening tests to determine how aggressive she should be with lipid management.  Also we spent 15 minutes talking about just this-this is in addition to the 15 minutes talking about her monitor..  CV Review of Symptoms (Summary) Cardiovascular ROS: no chest pain or dyspnea on exertion positive for - irregular heartbeat, palpitations and Not fast rates. negative for - edema, orthopnea, paroxysmal nocturnal dyspnea, rapid heart rate, shortness of breath or Syncope/near syncope, TIA/amaurosis fugax, claudication -> just  very anxious.  The patient does not have symptoms concerning for COVID-19 infection (fever, chills, cough, or new shortness of breath).  The patient is practicing social distancing & Masking.    REVIEWED OF SYSTEMS   A comprehensive ROS was performed. Review of Systems  Constitutional: Negative for malaise/fatigue (Not full of energy, but otherwise doing okay.) and weight loss.  HENT: Negative for congestion and nosebleeds.   Respiratory: Negative for shortness of breath.   Cardiovascular: Negative for chest pain.  Gastrointestinal: Negative for blood in stool, heartburn and melena.  Genitourinary: Negative for hematuria.  Musculoskeletal: Positive for joint pain. Negative for falls.  Neurological: Negative for dizziness, focal weakness and headaches.  Psychiatric/Behavioral: Negative for depression and memory loss. The patient is nervous/anxious.     I have reviewed and (if needed) personally updated the patient's problem list, medications, allergies, past medical and surgical history, social and family history.   PAST MEDICAL HISTORY   Past Medical History:  Diagnosis Date  . Adenomatous colon polyp 03/2012  . Cancer ([place])    basal on back and nose  . Cataract   . Chicken pox as a child  . CKD (chronic kidney disease), stage III 02/21/2017  . DDD (degenerative disc disease), cervical   . EBV infection    mono as child  . Fibrocystic breast 05/01/2014  . GERD (gastroesophageal reflux disease)    pt denies  . Hyperlipidemia   . Hypertension 20 yrs ago  . Measles as a child  . Medicare annual wellness visit, subsequent 02/01/2015  . Mumps as a child  . Neck pain 01/22/2016  . Other and unspecified hyperlipidemia 10/13/2013  . Overweight(278.02)   . PONV (postoperative nausea and vomiting)    pt would like something to prevent nausea  . Vasovagal reaction 07/15/2015   no syncope but close     PAST SURGICAL HISTORY   Past Surgical History:  Procedure Laterality Date  .  ABDOMINAL HYSTERECTOMY  29 yrs ago   heavy bleeding, ovaries left in place  . ANTERIOR CERVICAL DECOMP/DISCECTOMY FUSION N/A 05/15/2017   Procedure: ANTERIOR CERVICAL DECOMPRESSION/DISCECTOMY FUSION CERVICL THREE CERVICAL FOUR, CERVICAL FOUR- CERVICAL FIVE;  Surgeon: Ashok Pall, MD;  Location: [facility];  Service: Neurosurgery;  Laterality: N/A;  . 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 COLONOSCOPY    . EYE SURGERY  11-27-2010   cataract surgery in both eyes  . NM MYOVIEW LTD  04/2017   EF 65-70%.  Exercise 4:30 min (4.6 METS)-she reached peak heart rate 146 bpm which is 96% max protected.  No EKG changes.  Normal wall motion. No evidence of ischemia or infarction.   Marland Kitchen POLYPECTOMY    . TRANSTHORACIC ECHOCARDIOGRAM  01/2017    EF 60 to 65%.  GR 1 DD.  Normal wall motion.  Normal atrial sizes.  Mildly dilated IVC.  Marland Kitchen VAGINAL HYSTERECTOMY  79 yrs old   uterus removed  MEDICATIONS/ALLERGIES   No outpatient medications have been marked as taking for the 10/22/19 encounter (Office Visit) with Leonie Man, MD.    Allergies  Allergen Reactions  . Lisinopril Other (See Comments)    dizzy  . Statins Other (See Comments)    Muscle weakness   . Sulfa Antibiotics Other (See Comments)    "feels feverish"  . Tetracyclines & Related Other (See Comments)    GI upset  . Benadryl [Diphenhydramine] Other (See Comments)    Urinary incontinence stopped when med discontinued  Notes intolerance to statins  SOCIAL HISTORY/FAMILY HISTORY   Social History   Tobacco Use  . Smoking status: Former Smoker    Packs/day: 1.00    Years: 16.00    Pack years: 16.00    Types: Cigarettes    Start date: 07/29/1972  . Smokeless tobacco: Never Used  Substance Use Topics  . Alcohol use: No    Alcohol/week: 0.0 standard drinks  . Drug use: No   Social History   Social History Narrative  . Not on file    Family History family history includes Cancer in her  paternal grandfather; Dementia in her maternal grandmother and mother; Heart attack in her paternal grandfather; Heart attack (age of onset: 72) in her father; Heart disease in her father and maternal grandfather; Hyperlipidemia in her paternal grandmother; Hypertension in her father; Other in her son; Pulmonary embolism in her son; Pyruvate dehydrogenase deficiency in her son; Stomach cancer in her paternal grandfather; Vasculitis in her son.  OBJCTIVE -PE, EKG, labs   Wt Readings from Last 3 Encounters:  10/22/19 155 lb 9.6 oz (70.6 kg)  09/14/19 155 lb 3.2 oz (70.4 kg)  09/13/19 155 lb 3.2 oz (70.4 kg)    Physical Exam: BP (!) 150/70   Pulse 76   Temp (!) 96.8 F (36 C)   Ht 5\' 4"  (1.626 m)   Wt 155 lb 9.6 oz (70.6 kg)   SpO2 96%   BMI 26.71 kg/m  Physical Exam  Constitutional: She is oriented to person, place, and time. She appears well-developed and well-nourished. No distress.  Healthy-appearing.  Well-groomed.  HENT:  Head: Normocephalic and atraumatic.  Pulmonary/Chest: Effort normal.  Musculoskeletal:        General: No edema. Normal range of motion.     Cervical back: Normal range of motion and neck supple.  Neurological: She is alert and oriented to person, place, and time.  Psychiatric: She has a normal mood and affect. Her behavior is normal. Judgment and thought content normal.  Vitals reviewed.    Adult ECG Report Review of EKG from PCPs office. Rate: 81;  Rhythm: normal sinus rhythm, premature ventricular contractions (PVC) and Artifact in V4 and V5 make these leads unintelligible.  There is a subtle scooped ST segment in V6 consistent with previous EKGs.;  No evidence to suggest prior infarct or ischemia.  Normal R wave progression.  Normal axis, intervals and durations.  Narrative Interpretation: Essentially normal EKG with exception of PVCs.  Recent Labs:   Lab Results  Component Value Date   CHOL 244 (H) 09/13/2019   HDL 43.10 09/13/2019   LDLCALC 161  (H) 06/18/2019   LDLDIRECT 157.0 09/13/2019   TRIG 209.0 (H) 09/13/2019   CHOLHDL 6 09/13/2019   Lab Results  Component Value Date   CREATININE 0.82 09/13/2019   BUN 21 09/13/2019   NA 139 09/13/2019   K 3.7 09/13/2019   CL 101 09/13/2019   CO2 29 09/13/2019  Lab Results  Component Value Date   TSH 2.83 09/13/2019    ASSESSMENT/PLAN   Problem List Items Addressed This Visit    Premature ventricular contractions (PVCs) (VPCs) (Chronic)   Hypertension - Primary (Chronic)   Hot flashes     Laura [name] seems relatively stable.  Her PVC burden was very minimal on her monitor.  Less than 3 years ago she had a relatively normal echocardiogram and Myoview stress test.  She is not actively having significant symptoms of palpitations or arrhythmia.  She is not having any chest pain or pressure.  Her blood pressure is high today, but this is probably related to her being very anxious coming into the cardiology office.  I am leery of treating whitecoat syndrome blood pressure when her pressures at home are better.  Since she is having palpitations, if we were planning on treating her elevated blood pressures, my recommendation would be to consider a beta blockade with either a beta-blocker such as potentially Bystolic or metoprolol which are less likely to have side effects of fatigue.  Could also consider nondihydropyridine calcium channel blocker such as diltiazem which may not be as effective but less likely to cause fatigue.  As per baseline cardiovascular assessment I think she is wanted to continue to follow-up with cardiology.  She is seeing Dr. Debara Pickett in our lipid clinic here to discuss management options.  I think probably the best screening study for her to evaluate for underlying coronary disease would be a coronary calcium score.  This is a test that would be an out-of-pocket cost of $150 a screening test, but it would not tell us a baseline presence of coronary disease therefore perhaps  adjust our goals for treating her lipids.  I plan to see her back in a few months after which we can discuss whether or not we do a screening test such as coronary calcium score.  If she comes this conclusion after discussion with Dr. Debara Pickett, I would recommend to go ahead and order it and he can follow-up on the results and her visit with me.   COVID-19 Education: The signs and symptoms of COVID-19 were discussed with the patient and how to seek care for testing (follow up with PCP or arrange E-visit).   The importance of social distancing was discussed today.  I spent a total of 36 minutes with the patient. >  50% of the time was spent in direct patient consultation.  Despite having to review the results of her monitor and then other symptoms, she also undergone to discuss her lipids and baseline cardiovascular risk again.  We talked about screening tests, we talked about lipid clinic etc.  Extensive time spent. Additional time spent with chart review  / charting (studies, outside notes, etc): 6 Total Time: 42 min   Current medicines are reviewed at length with the patient today.  (+/- concerns) n/a   Patient Instructions / Medication Changes & Studies & Tests Ordered   Patient Instructions  Medication Instructions:  No changes  *If you need a refill on your cardiac medications before your next appointment, please call your pharmacy*   Lab Work: Not needed   Testing/Procedures :not needed   Follow-Up: At [facility], you and your health needs are our priority.  As part of our continuing mission to provide you with exceptional heart care, we have created designated Provider Care Teams.  These Care Teams include your primary Cardiologist (physician) and Advanced Practice Providers (APPs -  Physician Assistants  and Nurse Practitioners) who all work together to provide you with the care you need, when you need it.    Your next appointment:   3 to 4  month(s)  The format for  your next appointment:   In Person  Provider:   Glenetta Hew, MD   Other Instructions  keep your appointment with Dr Debara Pickett . Think about having a calcium scoring CT. You can let Dr Debara Pickett know your decision .   Keep Blood pressure Log  - at least once day or at 3 times week    CT coronary calcium score. This test is done at [address]. This is $150 out of pocket.   Coronary CalciumScan A coronary calcium scan is an imaging test used to look for deposits of calcium and other fatty materials (plaques) in the inner lining of the blood vessels of the heart (coronary arteries). These deposits of calcium and plaques can partly clog and narrow the coronary arteries without producing any symptoms or warning signs. This puts a person at risk for a heart attack. This test can detect these deposits before symptoms develop. Tell a health care provider about:  Any allergies you have.  All medicines you are taking, including vitamins, herbs, eye drops, creams, and over-the-counter medicines.  Any problems you or family members have had with anesthetic medicines.  Any blood disorders you have.  Any surgeries you have had.  Any medical conditions you have.  Whether you are pregnant or may be pregnant. What are the risks? Generally, this is a safe procedure. However, problems may occur, including:  Harm to a pregnant woman and her unborn baby. This test involves the use of radiation. Radiation exposure can be dangerous to a pregnant woman and her unborn baby. If you are pregnant, you generally should not have this procedure done.  Slight increase in the risk of cancer. This is because of the radiation involved in the test. What happens before the procedure? No preparation is needed for this procedure. What happens during the procedure?  You will undress and remove any jewelry around your neck or chest.  You will put on a hospital gown.  Sticky electrodes will be  placed on your chest. The electrodes will be connected to an electrocardiogram (ECG) machine to record a tracing of the electrical activity of your heart.  A CT scanner will take pictures of your heart. During this time, you will be asked to lie still and hold your breath for 2-3 seconds while a picture of your heart is being taken. The procedure may vary among health care providers and hospitals. What happens after the procedure?  You can get dressed.  You can return to your normal activities.  It is up to you to get the results of your test. Ask your health care provider, or the department that is doing the test, when your results will be ready. Summary  A coronary calcium scan is an imaging test used to look for deposits of calcium and other fatty materials (plaques) in the inner lining of the blood vessels of the heart (coronary arteries).  Generally, this is a safe procedure. Tell your health care provider if you are pregnant or may be pregnant.  No preparation is needed for this procedure.  A CT scanner will take pictures of your heart.  You can return to your normal activities after the scan is done. This information is not intended to replace advice given to you by  your health care provider. Make sure you discuss any questions you have with your health care provider. Document Released: 01/11/2008 Document Revised: 06/03/2016 Document Reviewed: 06/03/2016 Elsevier Interactive Patient Education  2017 [employer].       Studies Ordered:   No orders of the defined types were placed in this encounter.    Glenetta Hew, M.D., M.S. Interventional Cardiologist   Pager # [phone] Phone # [phone] [address] [place], [place] [address]   Thank you for choosing [facility] at [facility]!!

## 2019-10-22 NOTE — Patient Instructions (Signed)
Medication Instructions:  No changes  *If you need a refill on your cardiac medications before your next appointment, please call your pharmacy*   Lab Work: Not needed   Testing/Procedures :not needed   Follow-Up: At [facility], you and your health needs are our priority.  As part of our continuing mission to provide you with exceptional heart care, we have created designated Provider Care Teams.  These Care Teams include your primary Cardiologist (physician) and Advanced Practice Providers (APPs -  Physician Assistants and Nurse Practitioners) who all work together to provide you with the care you need, when you need it.    Your next appointment:   3 to 4  month(s)  The format for your next appointment:   In Person  Provider:   Glenetta Hew, MD   Other Instructions  keep your appointment with Dr Debara Pickett . Think about having a calcium scoring CT. You can let Dr Debara Pickett know your decision .   Keep Blood pressure Log  - at least once day or at 3 times week    CT coronary calcium score. This test is done at [address]. This is $150 out of pocket.   Coronary CalciumScan A coronary calcium scan is an imaging test used to look for deposits of calcium and other fatty materials (plaques) in the inner lining of the blood vessels of the heart (coronary arteries). These deposits of calcium and plaques can partly clog and narrow the coronary arteries without producing any symptoms or warning signs. This puts a person at risk for a heart attack. This test can detect these deposits before symptoms develop. Tell a health care provider about:  Any allergies you have.  All medicines you are taking, including vitamins, herbs, eye drops, creams, and over-the-counter medicines.  Any problems you or family members have had with anesthetic medicines.  Any blood disorders you have.  Any surgeries you have had.  Any medical conditions you have.  Whether you are  pregnant or may be pregnant. What are the risks? Generally, this is a safe procedure. However, problems may occur, including:  Harm to a pregnant woman and her unborn baby. This test involves the use of radiation. Radiation exposure can be dangerous to a pregnant woman and her unborn baby. If you are pregnant, you generally should not have this procedure done.  Slight increase in the risk of cancer. This is because of the radiation involved in the test. What happens before the procedure? No preparation is needed for this procedure. What happens during the procedure?  You will undress and remove any jewelry around your neck or chest.  You will put on a hospital gown.  Sticky electrodes will be placed on your chest. The electrodes will be connected to an electrocardiogram (ECG) machine to record a tracing of the electrical activity of your heart.  A CT scanner will take pictures of your heart. During this time, you will be asked to lie still and hold your breath for 2-3 seconds while a picture of your heart is being taken. The procedure may vary among health care providers and hospitals. What happens after the procedure?  You can get dressed.  You can return to your normal activities.  It is up to you to get the results of your test. Ask your health care provider, or the department that is doing the test, when your results will be ready. Summary  A coronary calcium scan is an imaging test used to  look for deposits of calcium and other fatty materials (plaques) in the inner lining of the blood vessels of the heart (coronary arteries).  Generally, this is a safe procedure. Tell your health care provider if you are pregnant or may be pregnant.  No preparation is needed for this procedure.  A CT scanner will take pictures of your heart.  You can return to your normal activities after the scan is done. This information is not intended to replace advice given to you by your health care  provider. Make sure you discuss any questions you have with your health care provider. Document Released: 01/11/2008 Document Revised: 06/03/2016 Document Reviewed: 06/03/2016 Elsevier Interactive Patient Education  2017 [employer].

## 2019-11-02 ENCOUNTER — Ambulatory Visit: Payer: Medicare Other | Admitting: Internal Medicine

## 2019-11-09 DIAGNOSIS — M7062 Trochanteric bursitis, left hip: Secondary | ICD-10-CM | POA: Diagnosis not present

## 2019-11-11 ENCOUNTER — Telehealth: Payer: Medicare Other | Admitting: Cardiology

## 2019-11-19 DIAGNOSIS — M7062 Trochanteric bursitis, left hip: Secondary | ICD-10-CM | POA: Diagnosis not present

## 2019-12-13 ENCOUNTER — Ambulatory Visit (INDEPENDENT_AMBULATORY_CARE_PROVIDER_SITE_OTHER): Payer: Medicare Other | Admitting: Family Medicine

## 2019-12-13 ENCOUNTER — Other Ambulatory Visit: Payer: Self-pay

## 2019-12-13 VITALS — BP 136/68 | HR 65 | Temp 97.6°F | Resp 12 | Ht 64.0 in

## 2019-12-13 DIAGNOSIS — E785 Hyperlipidemia, unspecified: Secondary | ICD-10-CM | POA: Diagnosis not present

## 2019-12-13 DIAGNOSIS — I10 Essential (primary) hypertension: Secondary | ICD-10-CM

## 2019-12-13 DIAGNOSIS — R739 Hyperglycemia, unspecified: Secondary | ICD-10-CM | POA: Diagnosis not present

## 2019-12-13 DIAGNOSIS — N183 Chronic kidney disease, stage 3 unspecified: Secondary | ICD-10-CM

## 2019-12-13 DIAGNOSIS — R9431 Abnormal electrocardiogram [ECG] [EKG]: Secondary | ICD-10-CM | POA: Diagnosis not present

## 2019-12-13 LAB — LIPID PANEL
Cholesterol: 243 mg/dL — ABNORMAL HIGH (ref 0–200)
HDL: 58.1 mg/dL (ref >39.00)
LDL Cholesterol: 161 mg/dL — ABNORMAL HIGH (ref 0–99)
NonHDL: 184.51
Total CHOL/HDL Ratio: 4
Triglycerides: 117 mg/dL (ref 0.0–149.0)
VLDL: 23.4 mg/dL (ref 0.0–40.0)

## 2019-12-13 LAB — COMPREHENSIVE METABOLIC PANEL
ALT: 11 U/L (ref 0–35)
AST: 15 U/L (ref 0–37)
Albumin: 4.2 g/dL (ref 3.5–5.2)
Alkaline Phosphatase: 50 U/L (ref 39–117)
BUN: 16 mg/dL (ref 6–23)
CO2: 30 mEq/L (ref 19–32)
Calcium: 9.4 mg/dL (ref 8.4–10.5)
Chloride: 103 mEq/L (ref 96–112)
Creatinine, Ser: 0.81 mg/dL (ref 0.40–1.20)
GFR: 68.46 mL/min (ref >60.00)
Glucose, Bld: 96 mg/dL (ref 70–99)
Potassium: 4.1 mEq/L (ref 3.5–5.1)
Sodium: 140 mEq/L (ref 135–145)
Total Bilirubin: 0.4 mg/dL (ref 0.2–1.2)
Total Protein: 6.3 g/dL (ref 6.0–8.3)

## 2019-12-13 LAB — CBC
HCT: 40 % (ref 36.0–46.0)
Hemoglobin: 13.4 g/dL (ref 12.0–15.0)
MCHC: 33.6 g/dL (ref 30.0–36.0)
MCV: 89.6 fl (ref 78.0–100.0)
Platelets: 236 10*3/uL (ref 150.0–400.0)
RBC: 4.46 Mil/uL (ref 3.87–5.11)
RDW: 14.1 % (ref 11.5–15.5)
WBC: 7.1 10*3/uL (ref 4.0–10.5)

## 2019-12-13 LAB — HEMOGLOBIN A1C: Hgb A1c MFr Bld: 5.9 % (ref 4.6–6.5)

## 2019-12-13 NOTE — Patient Instructions (Addendum)
Encouraged increased hydration and fiber in diet. Daily probiotics. If bowels not moving can use MOM 2 tbls po in 4 oz of warm prune juice by mouth every 2-3 days. If no results then repeat in 4 hours with  Dulcolax suppository pr, may repeat again in 4 more hours as needed. Seek care if symptoms worsen. Consider daily Miralax and/or Dulcolax if symptoms persist.    High Cholesterol  High cholesterol is a condition in which the blood has high levels of a white, waxy, fat-like substance (cholesterol). The human body needs small amounts of cholesterol. The liver makes all the cholesterol that the body needs. Extra (excess) cholesterol comes from the food that we eat. Cholesterol is carried from the liver by the blood through the blood vessels. If you have high cholesterol, deposits (plaques) may build up on the walls of your blood vessels (arteries). Plaques make the arteries narrower and stiffer. Cholesterol plaques increase your risk for heart attack and stroke. Work with your health care provider to keep your cholesterol levels in a healthy range. What increases the risk? This condition is more likely to develop in people who:  Eat foods that are high in animal fat (saturated fat) or cholesterol.  Are overweight.  Are not getting enough exercise.  Have a family history of high cholesterol. What are the signs or symptoms? There are no symptoms of this condition. How is this diagnosed? This condition may be diagnosed from the results of a blood test.  If you are older than age 69, your health care provider may check your cholesterol every 4-6 years.  You may be checked more often if you already have high cholesterol or other risk factors for heart disease. The blood test for cholesterol measures:  "Bad" cholesterol (LDL cholesterol). This is the main type of cholesterol that causes heart disease. The desired level for LDL is less than 100.  "Good" cholesterol (HDL cholesterol). This type  helps to protect against heart disease by cleaning the arteries and carrying the LDL away. The desired level for HDL is 60 or higher.  Triglycerides. These are fats that the body can store or burn for energy. The desired number for triglycerides is lower than 150.  Total cholesterol. This is a measure of the total amount of cholesterol in your blood, including LDL cholesterol, HDL cholesterol, and triglycerides. A healthy number is less than 200. How is this treated? This condition is treated with diet changes, lifestyle changes, and medicines. Diet changes  This may include eating more whole grains, fruits, vegetables, nuts, and fish.  This may also include cutting back on red meat and foods that have a lot of added sugar. Lifestyle changes  Changes may include getting at least 40 minutes of aerobic exercise 3 times a week. Aerobic exercises include walking, biking, and swimming. Aerobic exercise along with a healthy diet can help you maintain a healthy weight.  Changes may also include quitting smoking. Medicines  Medicines are usually given if diet and lifestyle changes have failed to reduce your cholesterol to healthy levels.  Your health care provider may prescribe a statin medicine. Statin medicines have been shown to reduce cholesterol, which can reduce the risk of heart disease. Follow these instructions at home: Eating and drinking If told by your health care provider:  Eat chicken (without skin), fish, veal, shellfish, ground [place] breast, and round or loin cuts of red meat.  Do not eat fried foods or fatty meats, such as hot dogs and salami.  Eat plenty of fruits, such as apples.  Eat plenty of vegetables, such as broccoli, potatoes, and carrots.  Eat beans, peas, and lentils.  Eat grains such as barley, rice, couscous, and bulgur wheat.  Eat pasta without cream sauces.  Use skim or nonfat milk, and eat low-fat or nonfat yogurt and cheeses.  Do not eat or drink  whole milk, cream, ice cream, egg yolks, or hard cheeses.  Do not eat stick margarine or tub margarines that contain trans fats (also called partially hydrogenated oils).  Do not eat saturated tropical oils, such as coconut oil and palm oil.  Do not eat cakes, cookies, crackers, or other baked goods that contain trans fats.  General instructions  Exercise as directed by your health care provider. Increase your activity level with activities such as gardening, walking, and taking the stairs.  Take over-the-counter and prescription medicines only as told by your health care provider.  Do not use any products that contain nicotine or tobacco, such as cigarettes and e-cigarettes. If you need help quitting, ask your health care provider.  Keep all follow-up visits as told by your health care provider. This is important. Contact a health care provider if:  You are struggling to maintain a healthy diet or weight.  You need help to start on an exercise program.  You need help to stop smoking. Get help right away if:  You have chest pain.  You have trouble breathing. This information is not intended to replace advice given to you by your health care provider. Make sure you discuss any questions you have with your health care provider. Document Revised: 07/18/2017 Document Reviewed: 01/13/2016 Elsevier Patient Education  [place].

## 2019-12-13 NOTE — Assessment & Plan Note (Signed)
Hydrate and monitor 

## 2019-12-13 NOTE — Progress Notes (Signed)
Subjective:    Patient ID: Laura [name], female    DOB: [birth year], 79 y.o.   MRN: [id number]  Chief Complaint  Patient presents with  . Follow-up    HPI Patient is in today for follow up on chronic medical concerns. No recent febrile illness or hospitalizations. She has been seen by cardiology and no concerns identified. She expresses hesitancy about going to their lipid clinic but she understands she needs to do something about her cholesterol. She has had both of her COVID shots and tolerated them well. Denies CP/palp/SOB/HA/congestion/fevers/GI or GU c/o. Taking meds as prescribed  Past Medical History:  Diagnosis Date  . Adenomatous colon polyp 03/2012  . Cancer ([place])    basal on back and nose  . Cataract   . Chicken pox as a child  . CKD (chronic kidney disease), stage III 02/21/2017  . DDD (degenerative disc disease), cervical   . EBV infection    mono as child  . Fibrocystic breast 05/01/2014  . GERD (gastroesophageal reflux disease)    pt denies  . Hyperlipidemia   . Hypertension 20 yrs ago  . Measles as a child  . Medicare annual wellness visit, subsequent 02/01/2015  . Mumps as a child  . Neck pain 01/22/2016  . Other and unspecified hyperlipidemia 10/13/2013  . Overweight(278.02)   . PONV (postoperative nausea and vomiting)    pt would like something to prevent nausea  . Vasovagal reaction 07/15/2015   no syncope but close     Past Surgical History:  Procedure Laterality Date  . ABDOMINAL HYSTERECTOMY  29 yrs ago   heavy bleeding, ovaries left in place  . ANTERIOR CERVICAL DECOMP/DISCECTOMY FUSION N/A 05/15/2017   Procedure: ANTERIOR CERVICAL DECOMPRESSION/DISCECTOMY FUSION CERVICL THREE CERVICAL FOUR, CERVICAL FOUR- CERVICAL FIVE;  Surgeon: Ashok Pall, MD;  Location: [facility];  Service: Neurosurgery;  Laterality: N/A;  . 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 COLONOSCOPY    . EYE SURGERY  11-27-2010   cataract  surgery in both eyes  . NM MYOVIEW LTD  04/2017   EF 65-70%.  Exercise 4:30 min (4.6 METS)-she reached peak heart rate 146 bpm which is 96% max protected.  No EKG changes.  Normal wall motion. No evidence of ischemia or infarction.   Marland Kitchen POLYPECTOMY    . TRANSTHORACIC ECHOCARDIOGRAM  01/2017    EF 60 to 65%.  GR 1 DD.  Normal wall motion.  Normal atrial sizes.  Mildly dilated IVC.  Marland Kitchen VAGINAL HYSTERECTOMY  79 yrs old   uterus removed    Family History  Problem Relation Age of Onset  . Heart disease Father   . Heart attack Father 83  . Hypertension Father   . Dementia Maternal Grandmother   . Heart disease Maternal Grandfather   . Hyperlipidemia Paternal Grandmother   . Cancer Paternal Grandfather        stomach  . Heart attack Paternal Grandfather   . Stomach cancer Paternal Grandfather   . Dementia Mother   . Pulmonary embolism Son   . Vasculitis Son   . Pyruvate dehydrogenase deficiency Son   . Other Son        vasculitis led to PE  . Colon cancer Neg Hx   . Colon polyps Neg Hx   . Rectal cancer Neg Hx     Social History   Socioeconomic History  . Marital status: Married    Spouse name: Not on file  .  Number of children: Not on file  . Years of education: Not on file  . Highest education level: Not on file  Occupational History  . Not on file  Tobacco Use  . Smoking status: Former Smoker    Packs/day: 1.00    Years: 16.00    Pack years: 16.00    Types: Cigarettes    Start date: 07/29/1972  . Smokeless tobacco: Never Used  Substance and Sexual Activity  . Alcohol use: No    Alcohol/week: 0.0 standard drinks  . Drug use: No  . Sexual activity: Never    Comment: lives with husband no major dietary restrictions  Other Topics Concern  . Not on file  Social History Narrative  . Not on file   Social Determinants of Health   Financial Resource Strain:   . Difficulty of Paying Living Expenses:   Food Insecurity:   . Worried About Charity fundraiser in the Last  Year:   . Arboriculturist in the Last Year:   Transportation Needs:   . Film/video editor (Medical):   Marland Kitchen Lack of Transportation (Non-Medical):   Physical Activity:   . Days of Exercise per Week:   . Minutes of Exercise per Session:   Stress:   . Feeling of Stress :   Social Connections:   . Frequency of Communication with Friends and Family:   . Frequency of Social Gatherings with Friends and Family:   . Attends Religious Services:   . Active Member of Clubs or Organizations:   . Attends Archivist Meetings:   Marland Kitchen Marital Status:   Intimate Partner Violence:   . Fear of Current or Ex-Partner:   . Emotionally Abused:   Marland Kitchen Physically Abused:   . Sexually Abused:     Outpatient Medications Prior to Visit  Medication Sig Dispense Refill  . acetaminophen (TYLENOL) 500 MG tablet Take 500 mg by mouth 2 (two) times daily as needed for mild pain.     Marland Kitchen losartan-hydrochlorothiazide (HYZAAR) 50-12.5 MG tablet TAKE 1 TABLET BY MOUTH EVERY DAY 90 tablet 1  . naproxen (NAPROSYN) 500 MG tablet Take 500 mg by mouth at bedtime.   1   No facility-administered medications prior to visit.    Allergies  Allergen Reactions  . Lisinopril Other (See Comments)    dizzy  . Statins Other (See Comments)    Muscle weakness   . Sulfa Antibiotics Other (See Comments)    "feels feverish"  . Tetracyclines & Related Other (See Comments)    GI upset  . Benadryl [Diphenhydramine] Other (See Comments)    Urinary incontinence stopped when med discontinued    Review of Systems  Constitutional: Negative for fever and malaise/fatigue.  HENT: Negative for congestion.   Eyes: Negative for blurred vision.  Respiratory: Negative for shortness of breath.   Cardiovascular: Negative for chest pain, palpitations and leg swelling.  Gastrointestinal: Negative for abdominal pain, blood in stool and nausea.  Genitourinary: Negative for dysuria and frequency.  Musculoskeletal: Negative for falls.    Skin: Negative for rash.  Neurological: Negative for dizziness, loss of consciousness and headaches.  Endo/Heme/Allergies: Negative for environmental allergies.  Psychiatric/Behavioral: Negative for depression. The patient is not nervous/anxious.        Objective:    Physical Exam Vitals and nursing note reviewed.  Constitutional:      General: She is not in acute distress.    Appearance: She is well-developed.  HENT:     Head:  Normocephalic and atraumatic.     Nose: Nose normal.  Eyes:     General:        Right eye: No discharge.        Left eye: No discharge.  Cardiovascular:     Rate and Rhythm: Normal rate and regular rhythm.     Heart sounds: No murmur.  Pulmonary:     Effort: Pulmonary effort is normal.     Breath sounds: Normal breath sounds.  Abdominal:     General: Bowel sounds are normal.     Palpations: Abdomen is soft.     Tenderness: There is no abdominal tenderness.  Musculoskeletal:     Cervical back: Normal range of motion and neck supple.  Skin:    General: Skin is warm and dry.  Neurological:     Mental Status: She is alert and oriented to person, place, and time.     BP 136/68 (BP Location: Right Arm, Cuff Size: Normal)   Pulse 65   Temp 97.6 F (36.4 C) (Temporal)   Resp 12   Ht 5\' 4"  (1.626 m)   SpO2 92%   BMI 26.71 kg/m  Wt Readings from Last 3 Encounters:  10/22/19 155 lb 9.6 oz (70.6 kg)  09/14/19 155 lb 3.2 oz (70.4 kg)  09/13/19 155 lb 3.2 oz (70.4 kg)    Diabetic Foot Exam - Simple   No data filed     Lab Results  Component Value Date   WBC 6.2 09/13/2019   HGB 14.2 09/13/2019   HCT 41.9 09/13/2019   PLT 242.0 09/13/2019   GLUCOSE 96 09/13/2019   CHOL 244 (H) 09/13/2019   TRIG 209.0 (H) 09/13/2019   HDL 43.10 09/13/2019   LDLDIRECT 157.0 09/13/2019   LDLCALC 161 (H) 06/18/2019   ALT 9 09/13/2019   AST 14 09/13/2019   NA 139 09/13/2019   K 3.7 09/13/2019   CL 101 09/13/2019   CREATININE 0.82 09/13/2019   BUN 21  09/13/2019   CO2 29 09/13/2019   TSH 2.83 09/13/2019   HGBA1C 6.1 09/13/2019    Lab Results  Component Value Date   TSH 2.83 09/13/2019   Lab Results  Component Value Date   WBC 6.2 09/13/2019   HGB 14.2 09/13/2019   HCT 41.9 09/13/2019   MCV 88.9 09/13/2019   PLT 242.0 09/13/2019   Lab Results  Component Value Date   NA 139 09/13/2019   K 3.7 09/13/2019   CO2 29 09/13/2019   GLUCOSE 96 09/13/2019   BUN 21 09/13/2019   CREATININE 0.82 09/13/2019   BILITOT 0.4 09/13/2019   ALKPHOS 46 09/13/2019   AST 14 09/13/2019   ALT 9 09/13/2019   PROT 6.5 09/13/2019   ALBUMIN 4.2 09/13/2019   CALCIUM 9.9 09/13/2019   ANIONGAP 10 05/09/2017   GFR 67.54 09/13/2019   Lab Results  Component Value Date   CHOL 244 (H) 09/13/2019   Lab Results  Component Value Date   HDL 43.10 09/13/2019   Lab Results  Component Value Date   LDLCALC 161 (H) 06/18/2019   Lab Results  Component Value Date   TRIG 209.0 (H) 09/13/2019   Lab Results  Component Value Date   CHOLHDL 6 09/13/2019   Lab Results  Component Value Date   HGBA1C 6.1 09/13/2019       Assessment & Plan:   Problem List Items Addressed This Visit    Hypertension (Chronic)    She has been monitoring her blood pressures  at home and seeing systolics in Q000111Q and 0000000 and diastolic Q000111Q and 123XX123. At home this am was 143/70. Will have her try moving her Losartanhct to bedtime and see if that helps. Will adjust dosing if still runs high      Relevant Orders   CBC   Comprehensive metabolic panel   TSH   HLD (hyperlipidemia) - Primary (Chronic)    Encouraged heart healthy diet, increase exercise, avoid trans fats, consider a krill oil cap daily. She does not tolerate Statins, she is encouraged to keep her appointment with the lipid clinic to help control this, check level today      Relevant Orders   Lipid panel   CKD (chronic kidney disease), stage III    Hydrate and monitor      Nonspecific abnormal  electrocardiogram (ECG) (EKG)    She has seen cardiology and has not had any recurrent episodes nor have they identified any concerns. She has follow up with cardiology next month.        Other Visit Diagnoses    Hyperglycemia       Relevant Orders   Hemoglobin A1c      I am having Mirna A. Destin maintain her naproxen, acetaminophen, and losartan-hydrochlorothiazide.  No orders of the defined types were placed in this encounter.    Penni Homans, MD

## 2019-12-13 NOTE — Assessment & Plan Note (Signed)
She has seen cardiology and has not had any recurrent episodes nor have they identified any concerns. She has follow up with cardiology next month.

## 2019-12-13 NOTE — Assessment & Plan Note (Signed)
Encouraged heart healthy diet, increase exercise, avoid trans fats, consider a krill oil cap daily. She does not tolerate Statins, she is encouraged to keep her appointment with the lipid clinic to help control this, check level today

## 2019-12-13 NOTE — Assessment & Plan Note (Signed)
She has been monitoring her blood pressures at home and seeing systolics in Q000111Q and 0000000 and diastolic Q000111Q and 123XX123. At home this am was 143/70. Will have her try moving her Losartanhct to bedtime and see if that helps. Will adjust dosing if still runs high

## 2019-12-14 LAB — TSH: TSH: 1.75 u[IU]/mL (ref 0.35–4.50)

## 2019-12-15 ENCOUNTER — Encounter: Payer: Self-pay | Admitting: Family Medicine

## 2019-12-17 ENCOUNTER — Encounter: Payer: Self-pay | Admitting: Family Medicine

## 2019-12-17 ENCOUNTER — Other Ambulatory Visit: Payer: Self-pay | Admitting: Family Medicine

## 2019-12-17 MED ORDER — LOSARTAN POTASSIUM 50 MG PO TABS
50.0000 mg | ORAL_TABLET | Freq: Every evening | ORAL | 1 refills | Status: DC
Start: 2019-12-17 — End: 2020-06-05

## 2019-12-17 MED ORDER — HYDROCHLOROTHIAZIDE 12.5 MG PO TABS
12.5000 mg | ORAL_TABLET | Freq: Every morning | ORAL | 1 refills | Status: DC
Start: 2019-12-17 — End: 2019-12-20

## 2019-12-17 NOTE — Telephone Encounter (Signed)
Patient sent a second mychart message regarding her BP as well, "I just took my blood pressure this morning after taking my blood pressure medication at night and it dropped down to 130/67. Not bad huh?"

## 2019-12-20 ENCOUNTER — Other Ambulatory Visit: Payer: Self-pay | Admitting: Family Medicine

## 2019-12-20 ENCOUNTER — Encounter: Payer: Self-pay | Admitting: Family Medicine

## 2019-12-20 MED ORDER — HYDROCHLOROTHIAZIDE 12.5 MG PO TABS: 12.5000 mg | ORAL_TABLET | Freq: Every morning | ORAL | 1 refills | Status: DC

## 2019-12-20 NOTE — Telephone Encounter (Signed)
Refill done.  

## 2019-12-20 NOTE — Telephone Encounter (Signed)
Caller: Lelan Pons  Call back phone number: [phone]  [facility] states [facility] has not received the prescription refill for  hydrochlorothiazide (HYDRODIURIL) 12.5 MG tablet Please re[facility]send rx to [id number] - [place], [place] - [address] AT [address]  [place], [place] [address]  Phone:  [phone] Fax:  [phone]

## 2019-12-31 ENCOUNTER — Ambulatory Visit: Payer: Medicare Other | Admitting: Cardiology

## 2020-01-03 ENCOUNTER — Ambulatory Visit: Payer: Medicare Other | Admitting: Cardiology

## 2020-02-03 ENCOUNTER — Ambulatory Visit: Payer: Medicare Other | Admitting: Internal Medicine

## 2020-02-04 ENCOUNTER — Encounter: Payer: Self-pay | Admitting: Internal Medicine

## 2020-02-04 ENCOUNTER — Telehealth (INDEPENDENT_AMBULATORY_CARE_PROVIDER_SITE_OTHER): Payer: Medicare Other | Admitting: Internal Medicine

## 2020-02-04 VITALS — BP 129/82 | HR 60 | Ht 64.0 in | Wt 150.0 lb

## 2020-02-04 DIAGNOSIS — E785 Hyperlipidemia, unspecified: Secondary | ICD-10-CM | POA: Diagnosis not present

## 2020-02-04 DIAGNOSIS — T466X5A Adverse effect of antihyperlipidemic and antiarteriosclerotic drugs, initial encounter: Secondary | ICD-10-CM

## 2020-02-04 DIAGNOSIS — I493 Ventricular premature depolarization: Secondary | ICD-10-CM | POA: Diagnosis not present

## 2020-02-04 DIAGNOSIS — I10 Essential (primary) hypertension: Secondary | ICD-10-CM | POA: Diagnosis not present

## 2020-02-04 DIAGNOSIS — M791 Myalgia, unspecified site: Secondary | ICD-10-CM

## 2020-02-04 NOTE — Patient Instructions (Signed)
Medication Instructions:  .instuc  *If you need a refill on your cardiac medications before your next appointment, please call your pharmacy*   Lab Work: FASTING lipid panel due in about 3-4 months Complete about 1 week prior to next lipid clinic visit @ any [facility] There is a lab in Dr. Richardo Priest. Hilty's office   If you have labs (blood work) drawn today and your tests are completely normal, you will receive your results only by: Marland Kitchen MyChart Message (if you have MyChart) OR . A paper copy in the mail If you have any lab test that is abnormal or we need to change your treatment, we will call you to review the results.   Testing/Procedures: Dr. Debara Pickett has ordered a CT coronary calcium score. This test is done at [address]. This is $150 out of pocket.   Coronary CalciumScan A coronary calcium scan is an imaging test used to look for deposits of calcium and other fatty materials (plaques) in the inner lining of the blood vessels of the heart (coronary arteries). These deposits of calcium and plaques can partly clog and narrow the coronary arteries without producing any symptoms or warning signs. This puts a person at risk for a heart attack. This test can detect these deposits before symptoms develop. Tell a health care provider about:  Any allergies you have.  All medicines you are taking, including vitamins, herbs, eye drops, creams, and over-the-counter medicines.  Any problems you or family members have had with anesthetic medicines.  Any blood disorders you have.  Any surgeries you have had.  Any medical conditions you have.  Whether you are pregnant or may be pregnant. What are the risks? Generally, this is a safe procedure. However, problems may occur, including:  Harm to a pregnant woman and her unborn baby. This test involves the use of radiation. Radiation exposure can be dangerous to a pregnant woman and her unborn baby. If you are pregnant, you  generally should not have this procedure done.  Slight increase in the risk of cancer. This is because of the radiation involved in the test. What happens before the procedure? No preparation is needed for this procedure. What happens during the procedure?  You will undress and remove any jewelry around your neck or chest.  You will put on a hospital gown.  Sticky electrodes will be placed on your chest. The electrodes will be connected to an electrocardiogram (ECG) machine to record a tracing of the electrical activity of your heart.  A CT scanner will take pictures of your heart. During this time, you will be asked to lie still and hold your breath for 2-3 seconds while a picture of your heart is being taken. The procedure may vary among health care providers and hospitals. What happens after the procedure?  You can get dressed.  You can return to your normal activities.  It is up to you to get the results of your test. Ask your health care provider, or the department that is doing the test, when your results will be ready. Summary  A coronary calcium scan is an imaging test used to look for deposits of calcium and other fatty materials (plaques) in the inner lining of the blood vessels of the heart (coronary arteries).  Generally, this is a safe procedure. Tell your health care provider if you are pregnant or may be pregnant.  No preparation is needed for this procedure.  A CT scanner will take pictures of  your heart.  You can return to your normal activities after the scan is done. This information is not intended to replace advice given to you by your health care provider. Make sure you discuss any questions you have with your health care provider. Document Released: 01/11/2008 Document Revised: 06/03/2016 Document Reviewed: 06/03/2016 Elsevier Interactive Patient Education  2017 [place]: At [facility], you and your health needs are our priority.   As part of our continuing mission to provide you with exceptional heart care, we have created designated Provider Care Teams.  These Care Teams include your primary Cardiologist (physician) and Advanced Practice Providers (APPs -  Physician Assistants and Nurse Practitioners) who all work together to provide you with the care you need, when you need it.  We recommend signing up for the patient portal called "MyChart".  Sign up information is provided on this After Visit Summary.  MyChart is used to connect with patients for Virtual Visits (Telemedicine).  Patients are able to view lab/test results, encounter notes, upcoming appointments, etc.  Non-urgent messages can be sent to your provider as well.   To learn more about what you can do with MyChart, go to [email or web address].    Your next appointment:   4 month(s) - lipid clinic  The format for your next appointment:   Either In Person or Virtual  Provider:   K. Mali Hilty, MD   Other Instructions

## 2020-02-04 NOTE — Progress Notes (Addendum)
Virtual Visit via Telephone Note   This visit type was conducted due to national recommendations for restrictions regarding the COVID-19 Pandemic (e.g. social distancing) in an effort to limit this patient's exposure and mitigate transmission in our community.  Due to her co-morbid illnesses, this patient is at least at moderate risk for complications without adequate follow up.  This format is felt to be most appropriate for this patient at this time.  The patient did not have access to video technology/had technical difficulties with video requiring transitioning to audio format only (telephone).  All issues noted in this document were discussed and addressed.  No physical exam could be performed with this format.  Please refer to the patient's chart for her  consent to telehealth for [facility].   Evaluation Performed:  Telephone visit  Date:  02/04/2020   ID:  Laura [name], DOB 05-15-1941, MRN [id number]  Patient Location:  [address] [address]  Provider location:   [address] [place] [address], [place] [address]  PCP:  [name], MD  Cardiologist:  Glenetta Hew, MD Electrophysiologist:  None   Chief Complaint:  Manage dyslipidemia  History of Present Illness:    Laura [name] is a 79 y.o. female who presents via audio/video conferencing for a telehealth visit today.  This is a 79 year old female from [place] with a strong family history of heart disease including her father and grandfather, but longevity according to her on her mother side.  She is recently established with Dr. Ellyn Hack for evaluation of palpitations and is noted to have PVCs.  She has had stress testing and an echo back in 2018 which only showed mild abnormalities and the Myoview was normal.  She reports longstanding dyslipidemia and is tried numerous statins including atorvastatin, simvastatin, rosuvastatin, pravastatin and ezetimibe, all of which were not tolerated  due to myalgias.  Her most recent lipid profile showed total cholesterol 243, triglycerides 117, HDL 58 and LDL 161.  This is generally where her cholesterol is on a stable diet.  She reports being fairly active.  I was not able to find any imaging evidence of atherosclerosis, including review of old records including a CT angio of the chest from April 2010 in [place] which did not comment on any ASCVD.  The patient does not have symptoms concerning for COVID-19 infection (fever, chills, cough, or new SHORTNESS OF BREATH).    Prior CV studies:   The following studies were reviewed today:  Chart reviewed  PMHx:  Past Medical History:  Diagnosis Date  . Adenomatous colon polyp 03/2012  . Cancer ([place])    basal on back and nose  . Cataract   . Chicken pox as a child  . CKD (chronic kidney disease), stage III 02/21/2017  . DDD (degenerative disc disease), cervical   . EBV infection    mono as child  . Fibrocystic breast 05/01/2014  . GERD (gastroesophageal reflux disease)    pt denies  . Hyperlipidemia   . Hypertension 20 yrs ago  . Measles as a child  . Medicare annual wellness visit, subsequent 02/01/2015  . Mumps as a child  . Neck pain 01/22/2016  . Other and unspecified hyperlipidemia 10/13/2013  . Overweight(278.02)   . PONV (postoperative nausea and vomiting)    pt would like something to prevent nausea  . Vasovagal reaction 07/15/2015   no syncope but close     Past Surgical History:  Procedure Laterality Date  .  ABDOMINAL HYSTERECTOMY  29 yrs ago   heavy bleeding, ovaries left in place  . ANTERIOR CERVICAL DECOMP/DISCECTOMY FUSION N/A 05/15/2017   Procedure: ANTERIOR CERVICAL DECOMPRESSION/DISCECTOMY FUSION CERVICL THREE CERVICAL FOUR, CERVICAL FOUR- CERVICAL FIVE;  Surgeon: Ashok Pall, MD;  Location: [facility];  Service: Neurosurgery;  Laterality: N/A;  . 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 COLONOSCOPY    . EYE SURGERY   11-27-2010   cataract surgery in both eyes  . NM MYOVIEW LTD  04/2017   EF 65-70%.  Exercise 4:30 min (4.6 METS)-she reached peak heart rate 146 bpm which is 96% max protected.  No EKG changes.  Normal wall motion. No evidence of ischemia or infarction.   Marland Kitchen POLYPECTOMY    . TRANSTHORACIC ECHOCARDIOGRAM  01/2017    EF 60 to 65%.  GR 1 DD.  Normal wall motion.  Normal atrial sizes.  Mildly dilated IVC.  Marland Kitchen VAGINAL HYSTERECTOMY  79 yrs old   uterus removed    FAMHx:  Family History  Problem Relation Age of Onset  . Heart disease Father   . Heart attack Father 52  . Hypertension Father   . Dementia Maternal Grandmother   . Heart disease Maternal Grandfather   . Hyperlipidemia Paternal Grandmother   . Cancer Paternal Grandfather        stomach  . Heart attack Paternal Grandfather   . Stomach cancer Paternal Grandfather   . Dementia Mother   . Pulmonary embolism Son   . Vasculitis Son   . Pyruvate dehydrogenase deficiency Son   . Other Son        vasculitis led to PE  . Colon cancer Neg Hx   . Colon polyps Neg Hx   . Rectal cancer Neg Hx     SOCHx:   reports that she has quit smoking. Her smoking use included cigarettes. She started smoking about 47 years ago. She has a 16.00 pack-year smoking history. She has never used smokeless tobacco. She reports that she does not drink alcohol and does not use drugs.  ALLERGIES:  Allergies  Allergen Reactions  . Lisinopril Other (See Comments)    dizzy  . Statins Other (See Comments)    Muscle weakness - atorvastatin    . Sulfa Antibiotics Other (See Comments)    "feels feverish"  . Tetracyclines & Related Other (See Comments)    GI upset  . Benadryl [Diphenhydramine] Other (See Comments)    Urinary incontinence stopped when med discontinued    MEDS:  Current Meds  Medication Sig  . acetaminophen (TYLENOL) 500 MG tablet Take 500 mg by mouth 2 (two) times daily as needed for mild pain.   . hydrochlorothiazide (HYDRODIURIL) 12.5  MG tablet Take 1 tablet (12.5 mg total) by mouth every morning.  Marland Kitchen losartan (COZAAR) 50 MG tablet Take 1 tablet (50 mg total) by mouth every evening.  . naproxen (NAPROSYN) 500 MG tablet Take 500 mg by mouth daily as needed.      ROS: Pertinent items noted in HPI and remainder of comprehensive ROS otherwise negative.  Labs/Other Tests and Data Reviewed:    Recent Labs: 12/13/2019: ALT 11; BUN 16; Creatinine, Ser 0.81; Hemoglobin 13.4; Platelets 236.0; Potassium 4.1; Sodium 140; TSH 1.75   Recent Lipid Panel Lab Results  Component Value Date/Time   CHOL 243 (H) 12/13/2019 11:26 AM   TRIG 117.0 12/13/2019 11:26 AM   HDL 58.10 12/13/2019 11:26 AM   CHOLHDL 4 12/13/2019 11:26 AM  LDLCALC 161 (H) 12/13/2019 11:26 AM   LDLDIRECT 157.0 09/13/2019 10:29 AM    Wt Readings from Last 3 Encounters:  02/04/20 150 lb (68 kg)  10/22/19 155 lb 9.6 oz (70.6 kg)  09/14/19 155 lb 3.2 oz (70.4 kg)     Exam:    Vital Signs:  BP 129/82   Pulse 60   Ht 5\' 4"  (1.626 m)   Wt 150 lb (68 kg)   BMI 25.75 kg/m    Exam not performed due to telephone visit  ASSESSMENT & PLAN:    1. Mixed dyslipidemia 2. Statin intolerant-myalgias 3. Strong family history of coronary disease 4. Symptomatic PVCs  Ms. Morefield has a strong family history of coronary disease and a mixed dyslipidemia but is intolerant to statin due to myalgias and cannot take ezetimibe.  Her options for lowering her LDL cholesterol are limited.  I think would be better to risk stratify her further with coronary calcium scoring.  She is agreeable to that and will try to schedule it in mid August.  She is going to be traveling prior to that.  Based on those findings will likely pursue PCSK9 inhibitor which I think will be best tolerated and give her the most cardiovascular risk reduction.  She is agreeable to that plan.  Follow-up with me in 3 to 4 months with a lipid profile.  Thanks again for the kind referral.  COVID-19  Education: The signs and symptoms of COVID-19 were discussed with the patient and how to seek care for testing (follow up with PCP or arrange E-visit).  The importance of social distancing was discussed today.  Patient Risk:   After full review of this patients clinical status, I feel that they are at least moderate risk at this time.  Time:   Today, I have spent 25 minutes with the patient with telehealth technology discussing dyslipidemia, statin myalgia, PCSK9 inhibitors.  Medication Adjustments/Labs and Tests Ordered: Current medicines are reviewed at length with the patient today.  Concerns regarding medicines are outlined above.   Tests Ordered: Orders Placed This Encounter  Procedures  . CT CARDIAC SCORING  . Lipid panel    Medication Changes: No orders of the defined types were placed in this encounter.   Disposition:  in 4 month(s)  Pixie Casino, MD, [facility], [place] Director of the Advanced Lipid Disorders &  Cardiovascular Risk Reduction Clinic Diplomate of the American Board of Clinical Lipidology Attending Cardiologist  Direct Dial: [phone]  Fax: [phone]  Website:  [email or web address]  Pixie Casino, MD  02/04/2020 10:04 AM

## 2020-02-07 ENCOUNTER — Telehealth: Payer: Self-pay | Admitting: *Deleted

## 2020-02-07 ENCOUNTER — Encounter: Payer: Self-pay | Admitting: Cardiology

## 2020-02-07 ENCOUNTER — Telehealth (INDEPENDENT_AMBULATORY_CARE_PROVIDER_SITE_OTHER): Payer: Medicare Other | Admitting: Cardiology

## 2020-02-07 VITALS — BP 119/86 | HR 86 | Ht 64.0 in | Wt 153.0 lb

## 2020-02-07 DIAGNOSIS — E782 Mixed hyperlipidemia: Secondary | ICD-10-CM | POA: Diagnosis not present

## 2020-02-07 DIAGNOSIS — I10 Essential (primary) hypertension: Secondary | ICD-10-CM | POA: Diagnosis not present

## 2020-02-07 DIAGNOSIS — I493 Ventricular premature depolarization: Secondary | ICD-10-CM

## 2020-02-07 DIAGNOSIS — R0782 Intercostal pain: Secondary | ICD-10-CM | POA: Diagnosis not present

## 2020-02-07 NOTE — Telephone Encounter (Signed)
.    Patient Consent for Virtual Visit         Laura [name] has provided verbal consent on 02/07/2020 for a virtual visit (video or telephone).   CONSENT FOR VIRTUAL VISIT FOR:  Laura [name]  By participating in this virtual visit I agree to the following:  I hereby voluntarily request, consent and authorize [facility] and its employed or contracted physicians, physician assistants, nurse practitioners or other licensed health care professionals (the Practitioner), to provide me with telemedicine health care services (the "Services") as deemed necessary by the treating Practitioner. I acknowledge and consent to receive the Services by the Practitioner via telemedicine. I understand that the telemedicine visit will involve communicating with the Practitioner through live audiovisual communication technology and the disclosure of certain medical information by electronic transmission. I acknowledge that I have been given the opportunity to request an in-person assessment or other available alternative prior to the telemedicine visit and am voluntarily participating in the telemedicine visit.  I understand that I have the right to withhold or withdraw my consent to the use of telemedicine in the course of my care at any time, without affecting my right to future care or treatment, and that the Practitioner or I may terminate the telemedicine visit at any time. I understand that I have the right to inspect all information obtained and/or recorded in the course of the telemedicine visit and may receive copies of available information for a reasonable fee.  I understand that some of the potential risks of receiving the Services via telemedicine include:  Marland Kitchen Delay or interruption in medical evaluation due to technological equipment failure or disruption; . Information transmitted may not be sufficient (e.g. poor resolution of images) to allow for appropriate medical decision making by the  Practitioner; and/or  . In rare instances, security protocols could fail, causing a breach of personal health information.  Furthermore, I acknowledge that it is my responsibility to provide information about my medical history, conditions and care that is complete and accurate to the best of my ability. I acknowledge that Practitioner's advice, recommendations, and/or decision may be based on factors not within their control, such as incomplete or inaccurate data provided by me or distortions of diagnostic images or specimens that may result from electronic transmissions. I understand that the practice of medicine is not an exact science and that Practitioner makes no warranties or guarantees regarding treatment outcomes. I acknowledge that a copy of this consent can be made available to me via my patient portal ([facility]), or I can request a printed copy by calling the office of [facility].    I understand that my insurance will be billed for this visit.   I have read or had this consent read to me. . I understand the contents of this consent, which adequately explains the benefits and risks of the Services being provided via telemedicine.  . I have been provided ample opportunity to ask questions regarding this consent and the Services and have had my questions answered to my satisfaction. . I give my informed consent for the services to be provided through the use of telemedicine in my medical care

## 2020-02-07 NOTE — Assessment & Plan Note (Signed)
No real complaints of chest pain.  She just yet feels unusual when she has PVCs, but no exertional or resting chest pain

## 2020-02-07 NOTE — Assessment & Plan Note (Addendum)
Her palpitation symptoms seem to be much more rare now, and are relatively predictable/not worrisome to her.  We discussed the fact that he is relatively benign and probably do not require medical management.  She agrees that she would prefer to avoid medications if at all possible.  I reviewed her recent echo and Myoview results stating that these are pretty reassuring

## 2020-02-07 NOTE — Telephone Encounter (Signed)
RN spoke to patient. Instruction were given  from today's virtual visit 02/16/20.  AVS SUMMARY has been sent by mychart .   Patient verbalized understanding

## 2020-02-07 NOTE — Assessment & Plan Note (Signed)
Blood pressure looks great.  I do agree that HCTZ should not be taken at night and therefore if the intention was for her to take her blood pressure medications at night, the combination of losartan HCTZ is not the right choice.  She is now taking the losartan in the evening and HCTZ in the morning.  Trying to avoid further management.

## 2020-02-07 NOTE — Patient Instructions (Addendum)
Medication Instructions:  n/a *If you need a refill on your cardiac medications before your next appointment, please call your pharmacy*   Lab Work: n/a    Testing/Procedures: ** -You have been scheduled for the coronary calcium score CT scan on September 2 at 9:30 AM -> see directions/instructions below  CT coronary calcium score. This test is done at [address]. This is $150 out of pocket.   Coronary CalciumScan A coronary calcium scan is an imaging test used to look for deposits of calcium and other fatty materials (plaques) in the inner lining of the blood vessels of the heart (coronary arteries). These deposits of calcium and plaques can partly clog and narrow the coronary arteries without producing any symptoms or warning signs. This puts a person at risk for a heart attack. This test can detect these deposits before symptoms develop. Tell a health care provider about:  Any allergies you have.  All medicines you are taking, including vitamins, herbs, eye drops, creams, and over-the-counter medicines.  Any problems you or family members have had with anesthetic medicines.  Any blood disorders you have.  Any surgeries you have had.  Any medical conditions you have.  Whether you are pregnant or may be pregnant. What are the risks? Generally, this is a safe procedure. However, problems may occur, including:  Harm to a pregnant woman and her unborn baby. This test involves the use of radiation. Radiation exposure can be dangerous to a pregnant woman and her unborn baby. If you are pregnant, you generally should not have this procedure done.  Slight increase in the risk of cancer. This is because of the radiation involved in the test. What happens before the procedure? No preparation is needed for this procedure. What happens during the procedure?  You will undress and remove any jewelry around your neck or chest.  You will put on a hospital  gown.  Sticky electrodes will be placed on your chest. The electrodes will be connected to an electrocardiogram (ECG) machine to record a tracing of the electrical activity of your heart.  A CT scanner will take pictures of your heart. During this time, you will be asked to lie still and hold your breath for 2-3 seconds while a picture of your heart is being taken. The procedure may vary among health care providers and hospitals. What happens after the procedure?  You can get dressed.  You can return to your normal activities.  It is up to you to get the results of your test. Ask your health care provider, or the department that is doing the test, when your results will be ready. Summary  A coronary calcium scan is an imaging test used to look for deposits of calcium and other fatty materials (plaques) in the inner lining of the blood vessels of the heart (coronary arteries).  Generally, this is a safe procedure. Tell your health care provider if you are pregnant or may be pregnant.  No preparation is needed for this procedure.  A CT scanner will take pictures of your heart.  You can return to your normal activities after the scan is done. This information is not intended to replace advice given to you by your health care provider. Make sure you discuss any questions you have with your health care provider. Document Released: 01/11/2008 Document Revised: 06/03/2016 Document Reviewed: 06/03/2016 Elsevier Interactive Patient Education  2017 [employer].    Follow-Up: At [facility], you and your health needs  are our priority.  As part of our continuing mission to provide you with exceptional heart care, we have created designated Provider Care Teams.  These Care Teams include your primary Cardiologist (physician) and Advanced Practice Providers (APPs -  Physician Assistants and Nurse Practitioners) who all work together to provide you with the care you need, when you need  it.    Your next appointment:   3 month(s)  The format for your next appointment:   Either In Person or Virtual  Provider:   Glenetta Hew, MD   Other Instructions n/a

## 2020-02-07 NOTE — Assessment & Plan Note (Signed)
Again very reluctant to take any medications.  As per my discussion last visit and her visit with Dr. Debara Pickett, plan is for her to have a coronary calcium score checked to determine her underlying risk.  This would potentially drive [place] toward considering more aggressive management as opposed to simply dietary adjustments and exercise.

## 2020-02-07 NOTE — Progress Notes (Addendum)
Virtual Visit via Telephone Note   This visit type was conducted due to national recommendations for restrictions regarding the COVID-19 Pandemic (e.g. social distancing) in an effort to limit this patient's exposure and mitigate transmission in our community.  Due to her co-morbid illnesses, this patient is at least at moderate risk for complications without adequate follow up.  This format is felt to be most appropriate for this patient at this time.  The patient did not have access to video technology/had technical difficulties with video requiring transitioning to audio format only (telephone).  All issues noted in this document were discussed and addressed.  No physical exam could be performed with this format.  Please refer to the patient's chart for her  consent to telehealth for [facility].   Patient has given verbal permission to conduct this visit via virtual appointment and to bill insurance 02/10/2020 8:10 AM     Evaluation Performed:  Follow-up visit  Date:  02/10/2020   ID:  Laura [name], DOB [birth year], MRN [id number]  Patient Location: Home Provider Location: Home Office  PCP:  [name], MD  Cardiologist:  Glenetta Hew, MD  Electrophysiologist:  None   Chief Complaint:   Chief Complaint  Patient presents with  . Follow-up    Doing relatively well.  . Palpitations    Rare, trying to avoid triggers.    History of Present Illness:    Laura [name] is a 79 y.o. female with PMH below who presents via audio/video conferencing for a telehealth visit today for 20-monthfollow-up.  MKortneewas initially seen in consultation when admitted for chest pain and dyspnea back in July 2018.  She had a relative normal echocardiogram and was lost to follow-up.  Laura PAWLEYwas last seen by me on October 22, 2019 for 1 month follow after initial consultation at the request of Dr. SWillette Almafor PVCs and possible ST changes.  She had also describes episodes of chest  pain and malaise.. -> We reviewed the results of event monitor showing rare isolated PACs and PVCs.  2 short bursts of 5-9 beats of SVT.  We opted to avoid medical management -relatively asymptomatic of these  I referred her to Dr. HDebara Pickettfor hyperlipidemia management and suggested that may be we consider screening with coronary calcium score.  Dr. HDebara Pickettagreed with this and she has a scheduled coronary calcium score CT scan ordered for September 2  Hospitalizations:  . None   Recent - Interim CV studies:    The following studies were reviewed today:  March 2021: Event Monitor: Predominant underlying rhythm was Sinus Rhythm -->min HR of 53 bpm, max HR of 127 bpm, and avg HR of 70bpm.  2 short runs of SVT-fastest and longest 102 bpm at 9 beats, rare (less than 1%) isolated PACs with minimal couplets and no triplets.  Also rare isolated PVCs (less than 1%) with even more rare couplets.  No triplets.  There was ventricular trigeminy noted.   ISynthia InnocentHistory   Laura Kennedymet with Dr.Hilty on July 9 via telemedicine to discuss her lipids. ->  Coronary calcium score ordered.  She tells me that she is no longer really having any chest discomfort.  Her blood pressures have been doing very well since the addition of her new medication (losartan).  Her PCP actually recommended that she take the losartan at night (in order to do this, they separated the losartan and HCTZ, she is taking HCTZ in  the morning now.)  Since this change, she has been doing fine with blood pressures.  Her palpitations are also doing fairly well.  She has had a couple spells here and there but nothing really prolonged.  She thinks that they may be triggered by things like different foods or if she has her Diet Coke later in the day.  She is making a concerted effort to avoid certain triggers, but has a hard time getting away from her Diet Coke.  She is a little bit upset because her PCP diagnosed her with prediabetes.  I  explained that this is not all unexpected in a 79 year old.  Thankfully, she is not having any significant cardiac symptoms at this point.  Cardiovascular ROS: no chest pain or dyspnea on exertion positive for - Very rare palpitations. negative for - edema, orthopnea, paroxysmal nocturnal dyspnea, shortness of breath or Syncope/near syncope, TIA/almost fugax, claudication  ROS:  Please see the history of present illness.    The patient does not have symptoms concerning for COVID-19 infection (fever, chills, cough, or new shortness of breath).  Review of Systems  Constitutional: Negative for malaise/fatigue and weight loss.  HENT: Negative for congestion and nosebleeds.   Respiratory: Negative for shortness of breath.   Gastrointestinal: Negative for blood in stool and melena.  Genitourinary: Negative for hematuria.  Musculoskeletal: Positive for joint pain (Normal arthritis pains).  Neurological: Negative for dizziness, focal weakness and headaches.  Psychiatric/Behavioral: Negative for memory loss. The patient is nervous/anxious. The patient does not have insomnia.    The patient is practicing social distancing.  Past Medical History:  Diagnosis Date  . Adenomatous colon polyp 03/2012  . Cancer ([place])    basal on back and nose  . Cataract   . Chicken pox as a child  . CKD (chronic kidney disease), stage III 02/21/2017  . DDD (degenerative disc disease), cervical   . EBV infection    mono as child  . Fibrocystic breast 05/01/2014  . GERD (gastroesophageal reflux disease)    pt denies  . Hyperlipidemia   . Hypertension 20 yrs ago  . Measles as a child  . Medicare annual wellness visit, subsequent 02/01/2015  . Mumps as a child  . Neck pain 01/22/2016  . Other and unspecified hyperlipidemia 10/13/2013  . Overweight(278.02)   . PONV (postoperative nausea and vomiting)    pt would like something to prevent nausea  . Vasovagal reaction 07/15/2015   no syncope but close     Past  Surgical History:  Procedure Laterality Date  . ABDOMINAL HYSTERECTOMY  29 yrs ago   heavy bleeding, ovaries left in place  . ANTERIOR CERVICAL DECOMP/DISCECTOMY FUSION N/A 05/15/2017   Procedure: ANTERIOR CERVICAL DECOMPRESSION/DISCECTOMY FUSION CERVICL THREE CERVICAL FOUR, CERVICAL FOUR- CERVICAL FIVE;  Surgeon: Ashok Pall, MD;  Location: [place];  Service: Neurosurgery;  Laterality: N/A;  . 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 COLONOSCOPY    . EYE SURGERY  11-27-2010   cataract surgery in both eyes  . NM MYOVIEW LTD  04/2017   EF 65-70%.  Exercise 4:30 min (4.6 METS)-she reached peak heart rate 146 bpm which is 96% max protected.  No EKG changes.  Normal wall motion. No evidence of ischemia or infarction.   Marland Kitchen POLYPECTOMY    . TRANSTHORACIC ECHOCARDIOGRAM  01/2017    EF 60 to 65%.  GR 1 DD.  Normal wall motion.  Normal atrial sizes.  Mildly dilated IVC.  Marland Kitchen  VAGINAL HYSTERECTOMY  79 yrs old   uterus removed     Current Meds  Medication Sig  . acetaminophen (TYLENOL) 500 MG tablet Take 500 mg by mouth 2 (two) times daily as needed for mild pain.   . hydrochlorothiazide (HYDRODIURIL) 12.5 MG tablet Take 1 tablet (12.5 mg total) by mouth every morning.  Marland Kitchen losartan (COZAAR) 50 MG tablet Take 1 tablet (50 mg total) by mouth every evening.  . Multiple Vitamin (MULTIVITAMIN) tablet Take 1 tablet by mouth daily.  . naproxen (NAPROSYN) 500 MG tablet Take 500 mg by mouth daily as needed.      Allergies:   Lisinopril, Statins, Sulfa antibiotics, Tetracyclines & related, and Benadryl [diphenhydramine]   Social History   Tobacco Use  . Smoking status: Former Smoker    Packs/day: 1.00    Years: 16.00    Pack years: 16.00    Types: Cigarettes    Start date: 07/29/1972  . Smokeless tobacco: Never Used  Substance Use Topics  . Alcohol use: No    Alcohol/week: 0.0 standard drinks  . Drug use: No     Family Hx: The patient's family history  includes Cancer in her paternal grandfather; Dementia in her maternal grandmother and mother; Heart attack in her paternal grandfather; Heart attack (age of onset: 83) in her father; Heart disease in her father and maternal grandfather; Hyperlipidemia in her paternal grandmother; Hypertension in her father; Other in her son; Pulmonary embolism in her son; Stomach cancer in her paternal grandfather; Vasculitis in her son. There is no history of Colon cancer, Colon polyps, or Rectal cancer.   Labs/Other Tests and Data Reviewed:    EKG:  No ECG reviewed.  Recent Labs: 12/13/2019: ALT 11; BUN 16; Creatinine, Ser 0.81; Hemoglobin 13.4; Platelets 236.0; Potassium 4.1; Sodium 140; TSH 1.75   Recent Lipid Panel Lab Results  Component Value Date/Time   CHOL 243 (H) 12/13/2019 11:26 AM   TRIG 117.0 12/13/2019 11:26 AM   HDL 58.10 12/13/2019 11:26 AM   CHOLHDL 4 12/13/2019 11:26 AM   LDLCALC 161 (H) 12/13/2019 11:26 AM   LDLDIRECT 157.0 09/13/2019 10:29 AM    Wt Readings from Last 3 Encounters:  02/07/20 153 lb (69.4 kg)  02/04/20 150 lb (68 kg)  10/22/19 155 lb 9.6 oz (70.6 kg)     Objective:    Vital Signs:  BP 119/86   Pulse 86   Ht _0  (1.626 Laura)   Wt 153 lb (69.4 kg)   BMI 26.26 kg/Laura   VITAL SIGNS:  reviewed  Pleasant elderly female in no acute distress. A&O x 3.  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  We discussed the fact that he is relatively benign and probably do not require medical management.  She agrees that she would prefer to avoid medications if at all possible.  I reviewed her recent echo and Myoview results stating that these are pretty reassuring      Hypertension (Chronic)    Blood pressure looks great.  I  do agree that HCTZ should not be taken at night and therefore if the intention was for her to take her blood pressure medications at night, the combination of losartan HCTZ is not the right choice.  She is now taking the losartan in the evening and HCTZ in the morning.  Trying to avoid further management.      HLD (hyperlipidemia) (Chronic)    Again very reluctant to take any medications.  As per my discussion last visit and her visit with Dr. Debara Pickett, plan is for her to have a coronary calcium score checked to determine her underlying risk.  This would potentially drive [place] toward considering more aggressive management as opposed to simply dietary adjustments and exercise.      Chest pain    No real complaints of chest pain.  She just yet feels unusual when she has PVCs, but no exertional or resting chest pain         COVID-19 Education: The signs and symptoms of COVID-19 were discussed with the patient and how to seek care for testing (follow up with PCP or arrange E-visit).   The importance of social distancing was discussed today.  Time:   Today, I have spent 22 minutes with the patient with telehealth technology discussing the above problems.  Additional 6 minutes spent in charting.  28 minutes total;; multiple questions asked and answered reference cardiovascular health in women of her age.  We did discuss the coronary calcium score test.  We also talked about her palpitations.  I reviewed for her her prior tests including echocardiogram and nuclear stress test results.   Medication Adjustments/Labs and Tests Ordered: Current medicines are reviewed at length with the patient today.  Concerns regarding medicines are outlined above.   Patient Instructions  Medication Instructions:  n/a *If you need a refill on your cardiac medications before your next appointment, please call your pharmacy*   Lab Work: n/a    Testing/Procedures: ** -You have been scheduled for the coronary  calcium score CT scan on September 2 at 9:30 AM -> see directions/instructions below  CT coronary calcium score. This test is done at [address]. This is $150 out of pocket.   Coronary CalciumScan A coronary calcium scan is an imaging test used to look for deposits of calcium and other fatty materials (plaques) in the inner lining of the blood vessels of the heart (coronary arteries). These deposits of calcium and plaques can partly clog and narrow the coronary arteries without producing any symptoms or warning signs. This puts a person at risk for a heart attack. This test can detect these deposits before symptoms develop. Tell a health care provider about:  Any allergies you have.  All medicines you are taking, including vitamins, herbs, eye drops, creams, and over-the-counter medicines.  Any problems you or family members have had with anesthetic medicines.  Any blood disorders you have.  Any surgeries you have had.  Any medical conditions you have.  Whether you are pregnant or may be pregnant. What are the risks? Generally, this is a safe procedure. However, problems may occur, including:  Harm to a pregnant woman and her unborn baby. This test involves the use of radiation. Radiation exposure can be dangerous to a pregnant woman and her unborn baby. If you are pregnant, you generally should not have this procedure done.  Slight increase in the risk of cancer. This is because of the radiation involved in the test. What happens before the procedure? No preparation is needed for this procedure. What happens during the procedure?  You will undress and remove any jewelry around your neck or chest.  You will put on a hospital gown.  Sticky  electrodes will be placed on your chest. The electrodes will be connected to an electrocardiogram (ECG) machine to record a tracing of the electrical activity of your heart.  A CT scanner will take pictures of your heart.  During this time, you will be asked to lie still and hold your breath for 2-3 seconds while a picture of your heart is being taken. The procedure may vary among health care providers and hospitals. What happens after the procedure?  You can get dressed.  You can return to your normal activities.  It is up to you to get the results of your test. Ask your health care provider, or the department that is doing the test, when your results will be ready. Summary  A coronary calcium scan is an imaging test used to look for deposits of calcium and other fatty materials (plaques) in the inner lining of the blood vessels of the heart (coronary arteries).  Generally, this is a safe procedure. Tell your health care provider if you are pregnant or may be pregnant.  No preparation is needed for this procedure.  A CT scanner will take pictures of your heart.  You can return to your normal activities after the scan is done. This information is not intended to replace advice given to you by your health care provider. Make sure you discuss any questions you have with your health care provider. Document Released: 01/11/2008 Document Revised: 06/03/2016 Document Reviewed: 06/03/2016 Elsevier Interactive Patient Education  2017 [place]: At [facility], you and your health needs are our priority.  As part of our continuing mission to provide you with exceptional heart care, we have created designated Provider Care Teams.  These Care Teams include your primary Cardiologist (physician) and Advanced Practice Providers (APPs -  Physician Assistants and Nurse Practitioners) who all work together to provide you with the care you need, when you need it.    Your next appointment:   3 month(s)  The format for your next appointment:   Either In Person or Virtual  Provider:   Glenetta Hew, MD   Other Instructions n/a     Signed, Glenetta Hew, MD  02/10/2020 8:10 AM    [place]

## 2020-02-10 ENCOUNTER — Encounter: Payer: Self-pay | Admitting: Cardiology

## 2020-02-11 ENCOUNTER — Ambulatory Visit: Payer: Medicare Other | Admitting: Family Medicine

## 2020-02-14 ENCOUNTER — Ambulatory Visit: Payer: Medicare Other | Admitting: Medical

## 2020-03-15 ENCOUNTER — Other Ambulatory Visit: Payer: Medicare Other

## 2020-03-22 ENCOUNTER — Telehealth: Payer: Self-pay | Admitting: Internal Medicine

## 2020-03-22 ENCOUNTER — Ambulatory Visit (INDEPENDENT_AMBULATORY_CARE_PROVIDER_SITE_OTHER)
Admission: RE | Admit: 2020-03-22 | Discharge: 2020-03-22 | Disposition: A | Discharge status: Home / Self Care | Payer: Self-pay | Source: Ambulatory Visit | Attending: Internal Medicine | Admitting: Internal Medicine

## 2020-03-22 ENCOUNTER — Other Ambulatory Visit: Payer: Self-pay

## 2020-03-22 DIAGNOSIS — E785 Hyperlipidemia, unspecified: Secondary | ICD-10-CM

## 2020-03-22 DIAGNOSIS — R911 Solitary pulmonary nodule: Secondary | ICD-10-CM | POA: Diagnosis not present

## 2020-03-22 NOTE — Telephone Encounter (Signed)
[facility] called concerning patients CT calcium score report performed today. Report in Epic Message sent to MD

## 2020-03-23 ENCOUNTER — Telehealth: Payer: Self-pay | Admitting: Internal Medicine

## 2020-03-23 ENCOUNTER — Other Ambulatory Visit: Payer: Medicare Other

## 2020-03-23 ENCOUNTER — Other Ambulatory Visit: Payer: Self-pay | Admitting: *Deleted

## 2020-03-23 DIAGNOSIS — R911 Solitary pulmonary nodule: Secondary | ICD-10-CM

## 2020-03-23 DIAGNOSIS — E782 Mixed hyperlipidemia: Secondary | ICD-10-CM

## 2020-03-23 DIAGNOSIS — I2510 Atherosclerotic heart disease of native coronary artery without angina pectoris: Secondary | ICD-10-CM

## 2020-03-23 MED ORDER — PRALUENT 150 MG/ML ~~LOC~~ SOAJ: 1.0000 | SUBCUTANEOUS | 11 refills | Status: DC

## 2020-03-23 NOTE — Telephone Encounter (Signed)
PA submitted for Praluent 150mg /mL via CMM - preferred per [employer] Type of coverage approved: Prior Authorization This approval authorizes your coverage from 12/24/2019 - 03/23/2021

## 2020-03-23 NOTE — Telephone Encounter (Signed)
MD called patient and advised her of results, medication recommendation (PCSK9) and repeat chest CT

## 2020-03-30 ENCOUNTER — Other Ambulatory Visit: Payer: Medicare Other

## 2020-03-30 MED ORDER — ROSUVASTATIN CALCIUM 20 MG PO TABS: 20.0000 mg | ORAL_TABLET | Freq: Every day | ORAL | 3 refills | Status: DC

## 2020-03-30 NOTE — Addendum Note (Signed)
Addended by: Fidel Levy on: 03/30/2020 09:39 AM   Modules accepted: Orders

## 2020-04-02 DIAGNOSIS — Z23 Encounter for immunization: Secondary | ICD-10-CM | POA: Diagnosis not present

## 2020-04-03 ENCOUNTER — Encounter: Payer: Self-pay | Admitting: Family Medicine

## 2020-04-05 ENCOUNTER — Telehealth: Payer: Self-pay | Admitting: Internal Medicine

## 2020-04-05 NOTE — Telephone Encounter (Signed)
New message:     Wells Guiles from [place].

## 2020-04-05 NOTE — Telephone Encounter (Signed)
Laura [name] from [place] [facility] called in regards to patients CT scheduled 9/13. Patient recently had CT Scoring done on 8/26 which revealed a lung nodule. Patient has another CT scan scheduled 9/13. According to CT on 8/26 reccommended follow up would be 1 year for the nodule follow up. Wanted to clarify with Dr. Debara Pickett whether or not the patient needs to have CT 9/13 or if it can be scheduled for next August as a follow up. Advised I would send this message to Dr. Debara Pickett for advice.

## 2020-04-05 NOTE — Telephone Encounter (Signed)
That is a follow-up CT that should be done in 1 year (August or September 2022) due to a lung nodule.    Thanks.  Dr Lemmie Evens

## 2020-04-06 NOTE — Telephone Encounter (Deleted)
LM for [employer] FEP to return call with update on PA status or fax any info to our office. Was unable to reach live representative at time of call

## 2020-04-06 NOTE — Telephone Encounter (Signed)
Spoke with Wells Guiles from [facility] and advised her of Dr. Rod Mae advice, that a follow up CT is due in 1 year. Wells Guiles stated she would get scheduling to contact patient to cancel upcoming CT and reschedule for next year per Dr. Lysbeth Penner recommendation. Advised her to let us know if patient had any questions or concerns.

## 2020-04-10 ENCOUNTER — Inpatient Hospital Stay: Admission: RE | Admit: 2020-04-10 | Payer: Medicare Other | Source: Ambulatory Visit

## 2020-04-25 ENCOUNTER — Telehealth: Payer: Medicare Other | Admitting: Internal Medicine

## 2020-05-02 DIAGNOSIS — M7062 Trochanteric bursitis, left hip: Secondary | ICD-10-CM | POA: Diagnosis not present

## 2020-05-11 ENCOUNTER — Telehealth: Payer: Self-pay | Admitting: Family Medicine

## 2020-05-11 NOTE — Progress Notes (Signed)
  Chronic Care Management   Outreach Note  05/11/2020 Name: ONDA KATTNER MRN: [id number] DOB: Oct 24, 1940  Referred by: [name], MD Reason for referral : No chief complaint on file.   An unsuccessful telephone outreach was attempted today. The patient was referred to the pharmacist for assistance with care management and care coordination.   Follow Up Plan:   Carley Perdue [employer] Scheduler

## 2020-05-12 ENCOUNTER — Telehealth: Payer: Medicare Other | Admitting: Cardiology

## 2020-05-22 ENCOUNTER — Ambulatory Visit (INDEPENDENT_AMBULATORY_CARE_PROVIDER_SITE_OTHER): Payer: Medicare Other | Admitting: Family Medicine

## 2020-05-22 ENCOUNTER — Other Ambulatory Visit: Payer: Self-pay

## 2020-05-22 ENCOUNTER — Encounter: Payer: Self-pay | Admitting: Family Medicine

## 2020-05-22 VITALS — BP 112/70 | HR 80 | Temp 97.8°F | Ht 64.0 in | Wt 150.8 lb

## 2020-05-22 DIAGNOSIS — I2510 Atherosclerotic heart disease of native coronary artery without angina pectoris: Secondary | ICD-10-CM | POA: Diagnosis not present

## 2020-05-22 DIAGNOSIS — R739 Hyperglycemia, unspecified: Secondary | ICD-10-CM | POA: Diagnosis not present

## 2020-05-22 DIAGNOSIS — M25552 Pain in left hip: Secondary | ICD-10-CM | POA: Diagnosis not present

## 2020-05-22 DIAGNOSIS — E782 Mixed hyperlipidemia: Secondary | ICD-10-CM

## 2020-05-22 DIAGNOSIS — I10 Essential (primary) hypertension: Secondary | ICD-10-CM | POA: Diagnosis not present

## 2020-05-22 DIAGNOSIS — N183 Chronic kidney disease, stage 3 unspecified: Secondary | ICD-10-CM | POA: Diagnosis not present

## 2020-05-22 NOTE — Assessment & Plan Note (Signed)
Encouraged heart healthy diet, increase exercise, avoid trans fats, consider a krill oil cap daily, tolerating Rosuvastatin, no changes

## 2020-05-22 NOTE — Assessment & Plan Note (Signed)
Well controlled, no changes to meds. Encouraged heart healthy diet such as the DASH diet and exercise as tolerated.  °

## 2020-05-22 NOTE — Assessment & Plan Note (Signed)
Hydrate and monitor 

## 2020-05-22 NOTE — Assessment & Plan Note (Signed)
She was seen at [facility] for left hip bursitis and had a steroid injection which helped the bursitis and actual hip pain but now she is noting pain stretching from side of hip down the side of the leg that really only pulls when lies down on her side especially on the right side then she gets a pulling on her leg. No back pain, no pain with walking no pain with sitting straight but if she sits with weight on left hip pain can recur. Will refer to PT. xrays confirmed joint spaces preserved.

## 2020-05-22 NOTE — Progress Notes (Signed)
Subjective:    Patient ID: Laura [name], female    DOB: 08-10-1940, 79 y.o.   MRN: [id number]  Chief Complaint  Patient presents with  . Follow-up  . Hyperlipidemia    HPI Patient is in today for follow up on chronic medical concerns. No recent febrile illness or hospitalizations. No polyuria or polydipsia. She is trying to eat a heart healthy diet and to stay active. Denies CP/palp/SOB/HA/congestion/fevers/GI or GU c/o. Taking meds as prescribed  Past Medical History:  Diagnosis Date  . Adenomatous colon polyp 03/2012  . Cancer ([place])    basal on back and nose  . Cataract   . Chicken pox as a child  . CKD (chronic kidney disease), stage III ([place]) 02/21/2017  . DDD (degenerative disc disease), cervical   . EBV infection    mono as child  . Fibrocystic breast 05/01/2014  . GERD (gastroesophageal reflux disease)    pt denies  . Hyperlipidemia   . Hypertension 20 yrs ago  . Measles as a child  . Medicare annual wellness visit, subsequent 02/01/2015  . Mumps as a child  . Neck pain 01/22/2016  . Other and unspecified hyperlipidemia 10/13/2013  . Overweight(278.02)   . PONV (postoperative nausea and vomiting)    pt would like something to prevent nausea  . Vasovagal reaction 07/15/2015   no syncope but close     Past Surgical History:  Procedure Laterality Date  . ABDOMINAL HYSTERECTOMY  29 yrs ago   heavy bleeding, ovaries left in place  . ANTERIOR CERVICAL DECOMP/DISCECTOMY FUSION N/A 05/15/2017   Procedure: ANTERIOR CERVICAL DECOMPRESSION/DISCECTOMY FUSION CERVICL THREE CERVICAL FOUR, CERVICAL FOUR- CERVICAL FIVE;  Surgeon: Ashok Pall, MD;  Location: [facility];  Service: Neurosurgery;  Laterality: N/A;  . 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 COLONOSCOPY    . EYE SURGERY  11-27-2010   cataract surgery in both eyes  . NM MYOVIEW LTD  04/2017   EF 65-70%.  Exercise 4:30 min (4.6 METS)-she reached peak heart rate 146 bpm which is  96% max protected.  No EKG changes.  Normal wall motion. No evidence of ischemia or infarction.   Marland Kitchen POLYPECTOMY    . TRANSTHORACIC ECHOCARDIOGRAM  01/2017    EF 60 to 65%.  GR 1 DD.  Normal wall motion.  Normal atrial sizes.  Mildly dilated IVC.  Marland Kitchen VAGINAL HYSTERECTOMY  79 yrs old   uterus removed    Family History  Problem Relation Age of Onset  . Heart disease Father   . Heart attack Father 35  . Hypertension Father   . Dementia Maternal Grandmother   . Heart disease Maternal Grandfather   . Hyperlipidemia Paternal Grandmother   . Cancer Paternal Grandfather        stomach  . Heart attack Paternal Grandfather   . Stomach cancer Paternal Grandfather   . Dementia Mother   . Pulmonary embolism Son   . Vasculitis Son   . Other Son        vasculitis led to PE  . Colon cancer Neg Hx   . Colon polyps Neg Hx   . Rectal cancer Neg Hx     Social History   Socioeconomic History  . Marital status: Married    Spouse name: Not on file  . Number of children: Not on file  . Years of education: Not on file  . Highest education level: Not on file  Occupational History  . Not  on file  Tobacco Use  . Smoking status: Former Smoker    Packs/day: 1.00    Years: 16.00    Pack years: 16.00    Types: Cigarettes    Start date: 07/29/1972  . Smokeless tobacco: Never Used  Substance and Sexual Activity  . Alcohol use: No    Alcohol/week: 0.0 standard drinks  . Drug use: No  . Sexual activity: Never    Comment: lives with husband no major dietary restrictions  Other Topics Concern  . Not on file  Social History Narrative  . Not on file   Social Determinants of Health   Financial Resource Strain:   . Difficulty of Paying Living Expenses: Not on file  Food Insecurity:   . Worried About Charity fundraiser in the Last Year: Not on file  . Ran Out of Food in the Last Year: Not on file  Transportation Needs:   . Lack of Transportation (Medical): Not on file  . Lack of Transportation  (Non-Medical): Not on file  Physical Activity:   . Days of Exercise per Week: Not on file  . Minutes of Exercise per Session: Not on file  Stress:   . Feeling of Stress : Not on file  Social Connections:   . Frequency of Communication with Friends and Family: Not on file  . Frequency of Social Gatherings with Friends and Family: Not on file  . Attends Religious Services: Not on file  . Active Member of Clubs or Organizations: Not on file  . Attends Archivist Meetings: Not on file  . Marital Status: Not on file  Intimate Partner Violence:   . Fear of Current or Ex-Partner: Not on file  . Emotionally Abused: Not on file  . Physically Abused: Not on file  . Sexually Abused: Not on file    Outpatient Medications Prior to Visit  Medication Sig Dispense Refill  . acetaminophen (TYLENOL) 500 MG tablet Take 500 mg by mouth 2 (two) times daily as needed for mild pain.     . hydrochlorothiazide (HYDRODIURIL) 12.5 MG tablet Take 1 tablet (12.5 mg total) by mouth every morning. 90 tablet 1  . losartan (COZAAR) 50 MG tablet Take 1 tablet (50 mg total) by mouth every evening. 90 tablet 1  . Multiple Vitamin (MULTIVITAMIN) tablet Take 1 tablet by mouth daily.    . naproxen (NAPROSYN) 500 MG tablet Take 500 mg by mouth daily as needed.   1  . rosuvastatin (CRESTOR) 20 MG tablet Take 1 tablet (20 mg total) by mouth daily. 90 tablet 3  . Alirocumab (PRALUENT) 150 MG/ML SOAJ Inject 1 Dose into the skin every 14 (fourteen) days. 2 mL 11   No facility-administered medications prior to visit.    Allergies  Allergen Reactions  . Lisinopril Other (See Comments)    dizzy  . Statins Other (See Comments)    Muscle weakness - atorvastatin    . Sulfa Antibiotics Other (See Comments)    "feels feverish"  . Tetracyclines & Related Other (See Comments)    GI upset  . Benadryl [Diphenhydramine] Other (See Comments)    Urinary incontinence stopped when med discontinued    Review of Systems    Constitutional: Negative for fever and malaise/fatigue.  HENT: Negative for congestion.   Eyes: Negative for blurred vision.  Respiratory: Negative for shortness of breath.   Cardiovascular: Negative for chest pain, palpitations and leg swelling.  Gastrointestinal: Negative for abdominal pain, blood in stool and nausea.  Genitourinary: Negative for dysuria and frequency.  Musculoskeletal: Negative for falls.  Skin: Negative for rash.  Neurological: Negative for dizziness, loss of consciousness and headaches.  Endo/Heme/Allergies: Negative for environmental allergies.  Psychiatric/Behavioral: Negative for depression. The patient is not nervous/anxious.        Objective:    Physical Exam Vitals and nursing note reviewed.  Constitutional:      General: She is not in acute distress.    Appearance: She is well-developed.  HENT:     Head: Normocephalic and atraumatic.     Nose: Nose normal.  Eyes:     General:        Right eye: No discharge.        Left eye: No discharge.  Cardiovascular:     Rate and Rhythm: Normal rate and regular rhythm.     Heart sounds: No murmur heard.   Pulmonary:     Effort: Pulmonary effort is normal.     Breath sounds: Normal breath sounds.  Abdominal:     General: Bowel sounds are normal.     Palpations: Abdomen is soft.     Tenderness: There is no abdominal tenderness.  Musculoskeletal:     Cervical back: Normal range of motion and neck supple.  Skin:    General: Skin is warm and dry.  Neurological:     Mental Status: She is alert and oriented to person, place, and time.     BP 112/70 (BP Location: Right Arm)   Pulse 80   Temp 97.8 F (36.6 C)   Ht 5\' 4"  (1.626 m)   Wt 150 lb 12.8 oz (68.4 kg)   SpO2 98%   BMI 25.88 kg/m  Wt Readings from Last 3 Encounters:  05/22/20 150 lb 12.8 oz (68.4 kg)  02/07/20 153 lb (69.4 kg)  02/04/20 150 lb (68 kg)    Diabetic Foot Exam - Simple   No data filed     Lab Results  Component Value  Date   WBC 7.1 12/13/2019   HGB 13.4 12/13/2019   HCT 40.0 12/13/2019   PLT 236.0 12/13/2019   GLUCOSE 96 12/13/2019   CHOL 243 (H) 12/13/2019   TRIG 117.0 12/13/2019   HDL 58.10 12/13/2019   LDLDIRECT 157.0 09/13/2019   LDLCALC 161 (H) 12/13/2019   ALT 11 12/13/2019   AST 15 12/13/2019   NA 140 12/13/2019   K 4.1 12/13/2019   CL 103 12/13/2019   CREATININE 0.81 12/13/2019   BUN 16 12/13/2019   CO2 30 12/13/2019   TSH 1.75 12/13/2019   HGBA1C 5.9 12/13/2019    Lab Results  Component Value Date   TSH 1.75 12/13/2019   Lab Results  Component Value Date   WBC 7.1 12/13/2019   HGB 13.4 12/13/2019   HCT 40.0 12/13/2019   MCV 89.6 12/13/2019   PLT 236.0 12/13/2019   Lab Results  Component Value Date   NA 140 12/13/2019   K 4.1 12/13/2019   CO2 30 12/13/2019   GLUCOSE 96 12/13/2019   BUN 16 12/13/2019   CREATININE 0.81 12/13/2019   BILITOT 0.4 12/13/2019   ALKPHOS 50 12/13/2019   AST 15 12/13/2019   ALT 11 12/13/2019   PROT 6.3 12/13/2019   ALBUMIN 4.2 12/13/2019   CALCIUM 9.4 12/13/2019   ANIONGAP 10 05/09/2017   GFR 68.46 12/13/2019   Lab Results  Component Value Date   CHOL 243 (H) 12/13/2019   Lab Results  Component Value Date   HDL 58.10 12/13/2019  Lab Results  Component Value Date   LDLCALC 161 (H) 12/13/2019   Lab Results  Component Value Date   TRIG 117.0 12/13/2019   Lab Results  Component Value Date   CHOLHDL 4 12/13/2019   Lab Results  Component Value Date   HGBA1C 5.9 12/13/2019       Assessment & Plan:   Problem List Items Addressed This Visit    Hypertension (Chronic)    Well controlled, no changes to meds. Encouraged heart healthy diet such as the DASH diet and exercise as tolerated.       Relevant Orders   CBC   Comprehensive metabolic panel   TSH   HLD (hyperlipidemia) - Primary (Chronic)    Encouraged heart healthy diet, increase exercise, avoid trans fats, consider a krill oil cap daily, tolerating Rosuvastatin,  no changes      Relevant Orders   Lipid panel   CKD (chronic kidney disease), stage III ([place])    Hydrate and monitor      Relevant Orders   CBC   Comprehensive metabolic panel   Left hip pain    She was seen at [facility] for left hip bursitis and had a steroid injection which helped the bursitis and actual hip pain but now she is noting pain stretching from side of hip down the side of the leg that really only pulls when lies down on her side especially on the right side then she gets a pulling on her leg. No back pain, no pain with walking no pain with sitting straight but if she sits with weight on left hip pain can recur. Will refer to PT. xrays confirmed joint spaces preserved.      Hyperglycemia    hgba1c acceptable, minimize simple carbs. Increase exercise as tolerated.       Relevant Orders   Hemoglobin A1c      I have discontinued Lelan Pons A. Zachman's Praluent. I am also having her maintain her naproxen, acetaminophen, losartan, hydrochlorothiazide, multivitamin, and rosuvastatin.  No orders of the defined types were placed in this encounter.    Penni Homans, MD

## 2020-05-22 NOTE — Assessment & Plan Note (Signed)
hgba1c acceptable, minimize simple carbs. Increase exercise as tolerated.  

## 2020-05-23 ENCOUNTER — Encounter: Payer: Self-pay | Admitting: Family Medicine

## 2020-05-23 LAB — CBC
HCT: 41.4 % (ref 35.0–45.0)
Hemoglobin: 13.7 g/dL (ref 11.7–15.5)
MCH: 29.8 pg (ref 27.0–33.0)
MCHC: 33.1 g/dL (ref 32.0–36.0)
MCV: 90.2 fL (ref 80.0–100.0)
MPV: 10.1 fL (ref 7.5–12.5)
Platelets: 259 10*3/uL (ref 140–400)
RBC: 4.59 10*6/uL (ref 3.80–5.10)
RDW: 12.2 % (ref 11.0–15.0)
WBC: 8 10*3/uL (ref 3.8–10.8)

## 2020-05-23 LAB — COMPREHENSIVE METABOLIC PANEL
AG Ratio: 2 (calc) (ref 1.0–2.5)
ALT: 13 U/L (ref 6–29)
AST: 16 U/L (ref 10–35)
Albumin: 4.5 g/dL (ref 3.6–5.1)
Alkaline phosphatase (APISO): 45 U/L (ref 37–153)
BUN: 20 mg/dL (ref 7–25)
CO2: 27 mmol/L (ref 20–32)
Calcium: 10 mg/dL (ref 8.6–10.4)
Chloride: 105 mmol/L (ref 98–110)
Creat: 0.85 mg/dL (ref 0.60–0.93)
Globulin: 2.3 g/dL (calc) (ref 1.9–3.7)
Glucose, Bld: 84 mg/dL (ref 65–99)
Potassium: 4.3 mmol/L (ref 3.5–5.3)
Sodium: 142 mmol/L (ref 135–146)
Total Bilirubin: 0.3 mg/dL (ref 0.2–1.2)
Total Protein: 6.8 g/dL (ref 6.1–8.1)

## 2020-05-23 LAB — LIPID PANEL
Cholesterol: 162 mg/dL (ref <200)
HDL: 60 mg/dL (ref >=50)
LDL Cholesterol (Calc): 78 mg/dL (calc)
Non-HDL Cholesterol (Calc): 102 mg/dL (calc) (ref <130)
Total CHOL/HDL Ratio: 2.7 (calc) (ref <5.0)
Triglycerides: 143 mg/dL (ref <150)

## 2020-05-23 LAB — HEMOGLOBIN A1C
Hgb A1c MFr Bld: 5.7 % of total Hgb — ABNORMAL HIGH (ref <5.7)
Mean Plasma Glucose: 117 (calc)
eAG (mmol/L): 6.5 (calc)

## 2020-05-23 LAB — TSH: TSH: 2.48 mIU/L (ref 0.40–4.50)

## 2020-05-24 DIAGNOSIS — Z23 Encounter for immunization: Secondary | ICD-10-CM | POA: Diagnosis not present

## 2020-05-29 DIAGNOSIS — H524 Presbyopia: Secondary | ICD-10-CM | POA: Diagnosis not present

## 2020-05-29 DIAGNOSIS — H04123 Dry eye syndrome of bilateral lacrimal glands: Secondary | ICD-10-CM | POA: Diagnosis not present

## 2020-05-29 DIAGNOSIS — H26493 Other secondary cataract, bilateral: Secondary | ICD-10-CM | POA: Diagnosis not present

## 2020-05-29 DIAGNOSIS — H5213 Myopia, bilateral: Secondary | ICD-10-CM | POA: Diagnosis not present

## 2020-05-31 ENCOUNTER — Ambulatory Visit: Payer: Medicare Other | Admitting: Internal Medicine

## 2020-06-04 ENCOUNTER — Other Ambulatory Visit: Payer: Self-pay | Admitting: Family Medicine

## 2020-06-08 ENCOUNTER — Encounter (HOSPITAL_BASED_OUTPATIENT_CLINIC_OR_DEPARTMENT_OTHER): Payer: Self-pay | Admitting: *Deleted

## 2020-06-08 ENCOUNTER — Emergency Department (HOSPITAL_BASED_OUTPATIENT_CLINIC_OR_DEPARTMENT_OTHER)
Admission: EM | Admit: 2020-06-08 | Discharge: 2020-06-08 | Disposition: A | Discharge status: Home / Self Care | Payer: Medicare Other | Attending: Emergency Medicine | Admitting: Emergency Medicine

## 2020-06-08 ENCOUNTER — Other Ambulatory Visit: Payer: Self-pay

## 2020-06-08 ENCOUNTER — Emergency Department (HOSPITAL_BASED_OUTPATIENT_CLINIC_OR_DEPARTMENT_OTHER): Payer: Medicare Other

## 2020-06-08 DIAGNOSIS — Z87891 Personal history of nicotine dependence: Secondary | ICD-10-CM | POA: Diagnosis not present

## 2020-06-08 DIAGNOSIS — S6991XA Unspecified injury of right wrist, hand and finger(s), initial encounter: Secondary | ICD-10-CM | POA: Diagnosis not present

## 2020-06-08 DIAGNOSIS — W010XXA Fall on same level from slipping, tripping and stumbling without subsequent striking against object, initial encounter: Secondary | ICD-10-CM | POA: Insufficient documentation

## 2020-06-08 DIAGNOSIS — Z85828 Personal history of other malignant neoplasm of skin: Secondary | ICD-10-CM | POA: Insufficient documentation

## 2020-06-08 DIAGNOSIS — N183 Chronic kidney disease, stage 3 unspecified: Secondary | ICD-10-CM | POA: Diagnosis not present

## 2020-06-08 DIAGNOSIS — Z79899 Other long term (current) drug therapy: Secondary | ICD-10-CM | POA: Insufficient documentation

## 2020-06-08 DIAGNOSIS — I129 Hypertensive chronic kidney disease with stage 1 through stage 4 chronic kidney disease, or unspecified chronic kidney disease: Secondary | ICD-10-CM | POA: Insufficient documentation

## 2020-06-08 DIAGNOSIS — M25531 Pain in right wrist: Secondary | ICD-10-CM | POA: Insufficient documentation

## 2020-06-08 NOTE — ED Triage Notes (Signed)
Pt came to triage when another pts name was called. Pt refused weight. Her DOB was verified after complaint did not match.  States she slipped and fell. Injury to her right wrist.

## 2020-06-08 NOTE — ED Provider Notes (Signed)
[facility] EMERGENCY DEPARTMENT Provider Note   CSN: [id number] Arrival date & time: 06/08/20  1416     History Chief Complaint  Patient presents with  . Fall  . Wrist Injury    Laura [name] is a 79 y.o. female.  79 yo F with a chief complaints of right wrist pain after a fall.  The patient was in a rush to let her friend in the front door and she lost her balance and fell onto her right side.  Complaining of pain mostly to the right wrist.  Denies head injury denies loss consciousness denies neck or back pain.  She landed on her bottom but denies any significant pain to her right hip.  Has been able to ambulate without issue.  She does have a history of fractures of the right wrist in the past.  Has seen an orthopedist in [place].  The history is provided by the patient.  Fall This is a new problem. The current episode started 3 to 5 hours ago. The problem occurs rarely. The problem has been resolved. Pertinent negatives include no chest pain, no headaches and no shortness of breath. The symptoms are aggravated by bending and twisting. Nothing relieves the symptoms. She has tried nothing for the symptoms. The treatment provided no relief.  Wrist Injury Associated symptoms: no fever        Past Medical History:  Diagnosis Date  . Adenomatous colon polyp 03/2012  . 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 Cataract   . Chicken pox as a child  . CKD (chronic kidney disease), stage III ([place]) 02/21/2017  . DDD (degenerative disc disease), cervical   . EBV infection    mono as child  . Fibrocystic breast 05/01/2014  . GERD (gastroesophageal reflux disease)    pt denies  . Hyperlipidemia   . Hypertension 20 yrs ago  . Measles as a child  . Medicare annual wellness visit, subsequent 02/01/2015  . Mumps as a child  . Neck pain 01/22/2016  . Other and unspecified hyperlipidemia 10/13/2013  . Overweight(278.02)   . PONV (postoperative nausea and vomiting)    pt would  like something to prevent nausea  . Vasovagal reaction 07/15/2015   no syncope but close     Patient Active Problem List   Diagnosis Date Noted  . Left hip pain 05/22/2020  . Hyperglycemia 05/22/2020  . Premature ventricular contractions (PVCs) (VPCs) 09/14/2019  . Nonspecific abnormal electrocardiogram (ECG) (EKG) 09/14/2019  . 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/13/2019  . Educated about COVID-19 virus infection 12/10/2018  . Occipital neuralgia 08/07/2017  . Constipation 08/07/2017  . 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 Left foot pain 05/01/2017  . Obesity (BMI 30-39.9) 02/21/2017  . CKD (chronic kidney disease), stage III ([place]) 02/21/2017  . Chest pain 02/20/2017  . Hypertension 02/20/2017  . HLD (hyperlipidemia) 02/20/2017  . Neck pain 01/22/2016  . History of colonic polyps 07/15/2015  . Preventative health care 02/01/2015  . Fibrocystic breast 05/01/2014  . 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 Decreased visual acuity 01/23/2014  . Sun-damaged skin 01/23/2014  . Hot flashes 01/23/2014  . Recurrent BCC (basal cell carcinoma) 10/13/2013  . Cancer ([place])   . Cataract     Past Surgical History:  Procedure Laterality Date  . ABDOMINAL HYSTERECTOMY  29 yrs ago   heavy bleeding, ovaries left in place  . ANTERIOR CERVICAL DECOMP/DISCECTOMY FUSION N/A 05/15/2017   Procedure: ANTERIOR CERVICAL DECOMPRESSION/DISCECTOMY FUSION CERVICL  THREE CERVICAL FOUR, CERVICAL FOUR- CERVICAL FIVE;  Surgeon: Ashok Pall, MD;  Location: [facility];  Service: Neurosurgery;  Laterality: N/A;  . 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 COLONOSCOPY    . EYE SURGERY  11-27-2010   cataract surgery in both eyes  . NM MYOVIEW LTD  04/2017   EF 65-70%.  Exercise 4:30 min (4.6 METS)-she reached peak heart rate 146 bpm which is 96% max protected.  No EKG changes.  Normal wall motion. No evidence of ischemia or infarction.   Marland Kitchen POLYPECTOMY    . TRANSTHORACIC ECHOCARDIOGRAM   01/2017    EF 60 to 65%.  GR 1 DD.  Normal wall motion.  Normal atrial sizes.  Mildly dilated IVC.  Marland Kitchen VAGINAL HYSTERECTOMY  79 yrs old   uterus removed     OB History   No obstetric history on file.     Family History  Problem Relation Age of Onset  . Heart disease Father   . Heart attack Father 12  . Hypertension Father   . Dementia Maternal Grandmother   . Heart disease Maternal Grandfather   . Hyperlipidemia Paternal Grandmother   . Cancer Paternal Grandfather        stomach  . Heart attack Paternal Grandfather   . Stomach cancer Paternal Grandfather   . Dementia Mother   . Pulmonary embolism Son   . Vasculitis Son   . Other Son        vasculitis led to PE  . Colon cancer Neg Hx   . Colon polyps Neg Hx   . Rectal cancer Neg Hx     Social History   Tobacco Use  . Smoking status: Former Smoker    Packs/day: 1.00    Years: 16.00    Pack years: 16.00    Types: Cigarettes    Start date: 07/29/1972  . Smokeless tobacco: Never Used  Substance Use Topics  . Alcohol use: No    Alcohol/week: 0.0 standard drinks  . Drug use: No    Home Medications Prior to Admission medications   Medication Sig Start Date End Date Taking? Authorizing Provider  acetaminophen (TYLENOL) 500 MG tablet Take 500 mg by mouth 2 (two) times daily as needed for mild pain.     [provider]  hydrochlorothiazide (HYDRODIURIL) 12.5 MG tablet TAKE 1 TABLET BY MOUTH EVERY MORNING 06/05/20   [name], MD  losartan (COZAAR) 50 MG tablet TAKE 1 TABLET (50 MG TOTAL) BY MOUTH EVERY EVENING. 06/05/20   [name], MD  Multiple Vitamin (MULTIVITAMIN) tablet Take 1 tablet by mouth daily.    [provider]  naproxen (NAPROSYN) 500 MG tablet Take 500 mg by mouth daily as needed.  11/11/16   [provider]  rosuvastatin (CRESTOR) 20 MG tablet Take 1 tablet (20 mg total) by mouth daily. 03/30/20 06/28/20  Pixie Casino, MD    Allergies    Lisinopril, Statins, Sulfa  antibiotics, Tetracyclines & related, and Benadryl [diphenhydramine]  Review of Systems   Review of Systems  Constitutional: Negative for chills and fever.  HENT: Negative for congestion and rhinorrhea.   Eyes: Negative for redness and visual disturbance.  Respiratory: Negative for shortness of breath and wheezing.   Cardiovascular: Negative for chest pain and palpitations.  Gastrointestinal: Negative for nausea and vomiting.  Genitourinary: Negative for dysuria and urgency.  Musculoskeletal: Positive for arthralgias and myalgias.  Skin: Negative for pallor and wound.  Neurological:  Negative for dizziness and headaches.    Physical Exam Updated Vital Signs BP (!) 170/75 (BP Location: Left Arm)   Pulse 71   Temp 97.9 F (36.6 C) (Oral)   Resp 20   Ht 5\' 4"  (1.626 m)   SpO2 99%   BMI 25.88 kg/m   Physical Exam Vitals and nursing note reviewed.  Constitutional:      General: She is not in acute distress.    Appearance: She is well-developed. She is not diaphoretic.  HENT:     Head: Normocephalic and atraumatic.  Eyes:     Pupils: Pupils are equal, round, and reactive to light.  Cardiovascular:     Rate and Rhythm: Normal rate and regular rhythm.     Heart sounds: No murmur heard.  No friction rub. No gallop.   Pulmonary:     Effort: Pulmonary effort is normal.     Breath sounds: No wheezing or rales.  Abdominal:     General: There is no distension.     Palpations: Abdomen is soft.     Tenderness: There is no abdominal tenderness.  Musculoskeletal:        General: Tenderness present.     Cervical back: Normal range of motion and neck supple.     Comments: Mild tenderness to the right distal radius.  No obvious edema or bruising.  Pulse motor and sensation intact.  Some decreased dorsiflexion.  No pain at the elbow hand shoulder.  Skin:    General: Skin is warm and dry.  Neurological:     Mental Status: She is alert and oriented to person, place, and time.    Psychiatric:        Behavior: Behavior normal.     ED Results / Procedures / Treatments   Labs (all labs ordered are listed, but only abnormal results are displayed) Labs Reviewed - No data to display  EKG None  Radiology DG Wrist Complete Right  Result Date: 06/08/2020 CLINICAL DATA:  Fall with wrist injury EXAM: RIGHT WRIST - COMPLETE 3+ VIEW COMPARISON:  None. FINDINGS: There is chronic fracture deformity of the distal radius. There is possible acute nondisplaced fracture lucency of the distal radius on the scaphoid view. No subluxation. Degenerative changes at the first [facility] joint. IMPRESSION: Chronic fracture deformity of distal radius with possible acute nondisplaced fracture lucency on one view. Electronically Signed   By: Donavan Foil M.D.   On: 06/08/2020 15:11    Procedures Procedures (including critical care time)  Medications Ordered in ED Medications - No data to display  ED Course  I have reviewed the triage vital signs and the nursing notes.  Pertinent labs & imaging results that were available during my care of the patient were reviewed by me and considered in my medical decision making (see chart for details).    MDM Rules/Calculators/A&P                          79 yo F with a chief complaint of right wrist pain.  Patient fell while she was trying to answer the door.  Nonsyncopal by history.  Not obviously fractured on exam.  Subtle finding on one view of plain film concerning for possible acute fracture.  Will place in a splint.  Orthopedic follow-up.  4:47 PM:  I have discussed the diagnosis/risks/treatment options with the patient and believe the pt to be eligible for discharge home to follow-up with Ortho. We also discussed  returning to the ED immediately if new or worsening sx occur. We discussed the sx which are most concerning (e.g., sudden worsening pain, fever, inability to tolerate by mouth) that necessitate immediate return. Medications administered  to the patient during their visit and any new prescriptions provided to the patient are listed below.  Medications given during this visit Medications - No data to display   The patient appears reasonably screen and/or stabilized for discharge and I doubt any other medical condition or other [facility] requiring further screening, evaluation, or treatment in the ED at this time prior to discharge.   Final Clinical Impression(s) / ED Diagnoses Final diagnoses:  Wrist pain, acute, right    Rx / DC Orders ED Discharge Orders    None       Deno Etienne, DO 06/08/20 1648

## 2020-06-08 NOTE — Discharge Instructions (Signed)
Follow up with ortho in the office.  Return for worsening pain, numbness.

## 2020-06-09 DIAGNOSIS — M25531 Pain in right wrist: Secondary | ICD-10-CM | POA: Diagnosis not present

## 2020-08-03 ENCOUNTER — Other Ambulatory Visit (HOSPITAL_BASED_OUTPATIENT_CLINIC_OR_DEPARTMENT_OTHER): Payer: Self-pay | Admitting: Family Medicine

## 2020-08-03 DIAGNOSIS — Z1231 Encounter for screening mammogram for malignant neoplasm of breast: Secondary | ICD-10-CM

## 2020-08-17 ENCOUNTER — Ambulatory Visit (HOSPITAL_BASED_OUTPATIENT_CLINIC_OR_DEPARTMENT_OTHER)
Admission: RE | Admit: 2020-08-17 | Discharge: 2020-08-17 | Disposition: A | Discharge status: Home / Self Care | Payer: Medicare Other | Source: Ambulatory Visit | Attending: Family Medicine | Admitting: Family Medicine

## 2020-08-17 ENCOUNTER — Other Ambulatory Visit: Payer: Self-pay

## 2020-08-17 DIAGNOSIS — Z1231 Encounter for screening mammogram for malignant neoplasm of breast: Secondary | ICD-10-CM | POA: Diagnosis not present

## 2020-11-13 DIAGNOSIS — Z23 Encounter for immunization: Secondary | ICD-10-CM | POA: Diagnosis not present

## 2020-11-21 ENCOUNTER — Encounter: Payer: Self-pay | Admitting: Family Medicine

## 2020-11-21 ENCOUNTER — Ambulatory Visit (INDEPENDENT_AMBULATORY_CARE_PROVIDER_SITE_OTHER): Payer: Medicare Other | Admitting: Family Medicine

## 2020-11-21 ENCOUNTER — Other Ambulatory Visit: Payer: Self-pay

## 2020-11-21 DIAGNOSIS — I10 Essential (primary) hypertension: Secondary | ICD-10-CM

## 2020-11-21 DIAGNOSIS — R739 Hyperglycemia, unspecified: Secondary | ICD-10-CM

## 2020-11-21 DIAGNOSIS — N183 Chronic kidney disease, stage 3 unspecified: Secondary | ICD-10-CM | POA: Diagnosis not present

## 2020-11-21 DIAGNOSIS — I493 Ventricular premature depolarization: Secondary | ICD-10-CM

## 2020-11-21 DIAGNOSIS — E782 Mixed hyperlipidemia: Secondary | ICD-10-CM | POA: Diagnosis not present

## 2020-11-21 LAB — COMPREHENSIVE METABOLIC PANEL
ALT: 13 U/L (ref 0–35)
AST: 18 U/L (ref 0–37)
Albumin: 4.3 g/dL (ref 3.5–5.2)
Alkaline Phosphatase: 44 U/L (ref 39–117)
BUN: 25 mg/dL — ABNORMAL HIGH (ref 6–23)
CO2: 29 mEq/L (ref 19–32)
Calcium: 9.8 mg/dL (ref 8.4–10.5)
Chloride: 101 mEq/L (ref 96–112)
Creatinine, Ser: 0.93 mg/dL (ref 0.40–1.20)
GFR: 58.87 mL/min — ABNORMAL LOW (ref >60.00)
Glucose, Bld: 87 mg/dL (ref 70–99)
Potassium: 4.2 mEq/L (ref 3.5–5.1)
Sodium: 139 mEq/L (ref 135–145)
Total Bilirubin: 0.5 mg/dL (ref 0.2–1.2)
Total Protein: 7 g/dL (ref 6.0–8.3)

## 2020-11-21 LAB — LIPID PANEL
Cholesterol: 156 mg/dL (ref 0–200)
HDL: 49.2 mg/dL (ref >39.00)
LDL Cholesterol: 79 mg/dL (ref 0–99)
NonHDL: 106.71
Total CHOL/HDL Ratio: 3
Triglycerides: 141 mg/dL (ref 0.0–149.0)
VLDL: 28.2 mg/dL (ref 0.0–40.0)

## 2020-11-21 LAB — CBC
HCT: 39.4 % (ref 36.0–46.0)
Hemoglobin: 13.2 g/dL (ref 12.0–15.0)
MCHC: 33.5 g/dL (ref 30.0–36.0)
MCV: 88.2 fl (ref 78.0–100.0)
Platelets: 241 10*3/uL (ref 150.0–400.0)
RBC: 4.46 Mil/uL (ref 3.87–5.11)
RDW: 13.4 % (ref 11.5–15.5)
WBC: 5.4 10*3/uL (ref 4.0–10.5)

## 2020-11-21 LAB — TSH: TSH: 3.03 u[IU]/mL (ref 0.35–4.50)

## 2020-11-21 LAB — HEMOGLOBIN A1C: Hgb A1c MFr Bld: 6.1 % (ref 4.6–6.5)

## 2020-11-21 NOTE — Patient Instructions (Addendum)
Consider getting a tetanus shot at pharmacy and definitely get one if you get injured  Shingrix is the new shingles shot, 2 shots over 2-6 months, confirm coverage with insurance and document, then can return here for shots with nurse appt or at pharmacy Hypertension, Adult High blood pressure (hypertension) is when the force of blood pumping through the arteries is too strong. The arteries are the blood vessels that carry blood from the heart throughout the body. Hypertension forces the heart to work harder to pump blood and may cause arteries to become narrow or stiff. Untreated or uncontrolled hypertension can cause a heart attack, heart failure, a stroke, kidney disease, and other problems. A blood pressure reading consists of a higher number over a lower number. Ideally, your blood pressure should be below 120/80. The first ("top") number is called the systolic pressure. It is a measure of the pressure in your arteries as your heart beats. The second ("bottom") number is called the diastolic pressure. It is a measure of the pressure in your arteries as the heart relaxes. What are the causes? The exact cause of this condition is not known. There are some conditions that result in or are related to high blood pressure. What increases the risk? Some risk factors for high blood pressure are under your control. The following factors may make you more likely to develop this condition:  Smoking.  Having type 2 diabetes mellitus, high cholesterol, or both.  Not getting enough exercise or physical activity.  Being overweight.  Having too much fat, sugar, calories, or salt (sodium) in your diet.  Drinking too much alcohol. Some risk factors for high blood pressure may be difficult or impossible to change. Some of these factors include:  Having chronic kidney disease.  Having a family history of high blood pressure.  Age. Risk increases with age.  Race. You may be at higher risk if you are  African American.  Gender. Men are at higher risk than women before age 36. After age 96, women are at higher risk than men.  Having obstructive sleep apnea.  Stress. What are the signs or symptoms? High blood pressure may not cause symptoms. Very high blood pressure (hypertensive crisis) may cause:  Headache.  Anxiety.  Shortness of breath.  Nosebleed.  Nausea and vomiting.  Vision changes.  Severe chest pain.  Seizures. How is this diagnosed? This condition is diagnosed by measuring your blood pressure while you are seated, with your arm resting on a flat surface, your legs uncrossed, and your feet flat on the floor. The cuff of the blood pressure monitor will be placed directly against the skin of your upper arm at the level of your heart. It should be measured at least twice using the same arm. Certain conditions can cause a difference in blood pressure between your right and left arms. Certain factors can cause blood pressure readings to be lower or higher than normal for a short period of time:  When your blood pressure is higher when you are in a health care provider's office than when you are at home, this is called white coat hypertension. Most people with this condition do not need medicines.  When your blood pressure is higher at home than when you are in a health care provider's office, this is called masked hypertension. Most people with this condition may need medicines to control blood pressure. If you have a high blood pressure reading during one visit or you have normal blood pressure with  other risk factors, you may be asked to:  Return on a different day to have your blood pressure checked again.  Monitor your blood pressure at home for 1 week or longer. If you are diagnosed with hypertension, you may have other blood or imaging tests to help your health care provider understand your overall risk for other conditions. How is this treated? This condition is  treated by making healthy lifestyle changes, such as eating healthy foods, exercising more, and reducing your alcohol intake. Your health care provider may prescribe medicine if lifestyle changes are not enough to get your blood pressure under control, and if:  Your systolic blood pressure is above 130.  Your diastolic blood pressure is above 80. Your personal target blood pressure may vary depending on your medical conditions, your age, and other factors. Follow these instructions at home: Eating and drinking  Eat a diet that is high in fiber and potassium, and low in sodium, added sugar, and fat. An example eating plan is called the DASH (Dietary Approaches to Stop Hypertension) diet. To eat this way: ? Eat plenty of fresh fruits and vegetables. Try to fill one half of your plate at each meal with fruits and vegetables. ? Eat whole grains, such as whole-wheat pasta, brown rice, or whole-grain bread. Fill about one fourth of your plate with whole grains. ? Eat or drink low-fat dairy products, such as skim milk or low-fat yogurt. ? Avoid fatty cuts of meat, processed or cured meats, and poultry with skin. Fill about one fourth of your plate with lean proteins, such as fish, chicken without skin, beans, eggs, or tofu. ? Avoid pre-made and processed foods. These tend to be higher in sodium, added sugar, and fat.  Reduce your daily sodium intake. Most people with hypertension should eat less than 1,500 mg of sodium a day.  Do not drink alcohol if: ? Your health care provider tells you not to drink. ? You are pregnant, may be pregnant, or are planning to become pregnant.  If you drink alcohol: ? Limit how much you use to:  0-1 drink a day for women.  0-2 drinks a day for men. ? Be aware of how much alcohol is in your drink. In the [place] one drink equals one 12 oz bottle of beer (355 mL), one 5 oz glass of wine (148 mL), or one 1 oz glass of hard liquor (44 mL).   Lifestyle  Work with your  health care provider to maintain a healthy body weight or to lose weight. Ask what an ideal weight is for you.  Get at least 30 minutes of exercise most days of the week. Activities may include walking, swimming, or biking.  Include exercise to strengthen your muscles (resistance exercise), such as Pilates or lifting weights, as part of your weekly exercise routine. Try to do these types of exercises for 30 minutes at least 3 days a week.  Do not use any products that contain nicotine or tobacco, such as cigarettes, e-cigarettes, and chewing tobacco. If you need help quitting, ask your health care provider.  Monitor your blood pressure at home as told by your health care provider.  Keep all follow-up visits as told by your health care provider. This is important.   Medicines  Take over-the-counter and prescription medicines only as told by your health care provider. Follow directions carefully. Blood pressure medicines must be taken as prescribed.  Do not skip doses of blood pressure medicine. Doing this puts you  at risk for problems and can make the medicine less effective.  Ask your health care provider about side effects or reactions to medicines that you should watch for. Contact a health care provider if you:  Think you are having a reaction to a medicine you are taking.  Have headaches that keep coming back (recurring).  Feel dizzy.  Have swelling in your ankles.  Have trouble with your vision. Get help right away if you:  Develop a severe headache or confusion.  Have unusual weakness or numbness.  Feel faint.  Have severe pain in your chest or abdomen.  Vomit repeatedly.  Have trouble breathing. Summary  Hypertension is when the force of blood pumping through your arteries is too strong. If this condition is not controlled, it may put you at risk for serious complications.  Your personal target blood pressure may vary depending on your medical conditions, your  age, and other factors. For most people, a normal blood pressure is less than 120/80.  Hypertension is treated with lifestyle changes, medicines, or a combination of both. Lifestyle changes include losing weight, eating a healthy, low-sodium diet, exercising more, and limiting alcohol. This information is not intended to replace advice given to you by your health care provider. Make sure you discuss any questions you have with your health care provider. Document Revised: 03/25/2018 Document Reviewed: 03/25/2018 Elsevier Patient Education  2021 [employer].

## 2020-11-21 NOTE — Assessment & Plan Note (Signed)
Tolerating statin, encouraged heart healthy diet, avoid trans fats, minimize simple carbs and saturated fats. Increase exercise as tolerated 

## 2020-11-21 NOTE — Assessment & Plan Note (Signed)
hgba1c acceptable, minimize simple carbs. Increase exercise as tolerated.  

## 2020-11-21 NOTE — Assessment & Plan Note (Addendum)
Notes occasional episodes after eating. Does not last more than seconds and no associated symptoms. She follows with cardiology

## 2020-11-21 NOTE — Assessment & Plan Note (Signed)
Has been on the low end of normal lately, will try HCTZ stopping for now. Monitor bp

## 2020-11-21 NOTE — Assessment & Plan Note (Signed)
Hydrate and monitor 

## 2020-11-22 NOTE — Progress Notes (Signed)
Subjective:    Patient ID: Laura [name], female    DOB: 11-Oct-1940, 80 y.o.   MRN: [id number]  Chief Complaint  Patient presents with  . Follow-up  . Hypertension  . Hyperlipidemia    Pt has no problems or concerns.    HPI Patient is in today for follow up on chronic medical concerns including hypertension, hyperlipidemia and more. She feels well today. No recent febrile illness or hospitalizations. She has been staying active and maintaining a heart healthy diet. Denies CP/palp/SOB/HA/congestion/fevers/GI or GU c/o. Taking meds as prescribed  Past Medical History:  Diagnosis Date  . Adenomatous colon polyp 03/2012  . Cancer ([place])    basal on back and nose  . Cataract   . Chicken pox as a child  . CKD (chronic kidney disease), stage III ([place]) 02/21/2017  . DDD (degenerative disc disease), cervical   . EBV infection    mono as child  . Fibrocystic breast 05/01/2014  . GERD (gastroesophageal reflux disease)    pt denies  . Hyperlipidemia   . Hypertension 20 yrs ago  . Measles as a child  . Medicare annual wellness visit, subsequent 02/01/2015  . Mumps as a child  . Neck pain 01/22/2016  . Other and unspecified hyperlipidemia 10/13/2013  . Overweight(278.02)   . PONV (postoperative nausea and vomiting)    pt would like something to prevent nausea  . Vasovagal reaction 07/15/2015   no syncope but close     Past Surgical History:  Procedure Laterality Date  . ABDOMINAL HYSTERECTOMY  29 yrs ago   heavy bleeding, ovaries left in place  . ANTERIOR CERVICAL DECOMP/DISCECTOMY FUSION N/A 05/15/2017   Procedure: ANTERIOR CERVICAL DECOMPRESSION/DISCECTOMY FUSION CERVICL THREE CERVICAL FOUR, CERVICAL FOUR- CERVICAL FIVE;  Surgeon: Ashok Pall, MD;  Location: [facility];  Service: Neurosurgery;  Laterality: N/A;  . 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 COLONOSCOPY    . EYE SURGERY  11-27-2010   cataract surgery in both eyes  . NM MYOVIEW  LTD  04/2017   EF 65-70%.  Exercise 4:30 min (4.6 METS)-she reached peak heart rate 146 bpm which is 96% max protected.  No EKG changes.  Normal wall motion. No evidence of ischemia or infarction.   Marland Kitchen POLYPECTOMY    . TRANSTHORACIC ECHOCARDIOGRAM  01/2017    EF 60 to 65%.  GR 1 DD.  Normal wall motion.  Normal atrial sizes.  Mildly dilated IVC.  Marland Kitchen VAGINAL HYSTERECTOMY  80 yrs old   uterus removed    Family History  Problem Relation Age of Onset  . Heart disease Father   . Heart attack Father 76  . Hypertension Father   . Dementia Maternal Grandmother   . Heart disease Maternal Grandfather   . Hyperlipidemia Paternal Grandmother   . Cancer Paternal Grandfather        stomach  . Heart attack Paternal Grandfather   . Stomach cancer Paternal Grandfather   . Dementia Mother   . Pulmonary embolism Son   . Vasculitis Son   . Other Son        vasculitis led to PE  . Colon cancer Neg Hx   . Colon polyps Neg Hx   . Rectal cancer Neg Hx     Social History   Socioeconomic History  . Marital status: Married    Spouse name: Not on file  . Number of children: Not on file  . Years of education: Not on  file  . Highest education level: Not on file  Occupational History  . Not on file  Tobacco Use  . Smoking status: Former Smoker    Packs/day: 1.00    Years: 16.00    Pack years: 16.00    Types: Cigarettes    Start date: 07/29/1972  . Smokeless tobacco: Never Used  Substance and Sexual Activity  . Alcohol use: No    Alcohol/week: 0.0 standard drinks  . Drug use: No  . Sexual activity: Never    Comment: lives with husband no major dietary restrictions  Other Topics Concern  . Not on file  Social History Narrative  . Not on file   Social Determinants of Health   Financial Resource Strain: Not on file  Food Insecurity: Not on file  Transportation Needs: Not on file  Physical Activity: Not on file  Stress: Not on file  Social Connections: Not on file  Intimate Partner  Violence: Not on file    Outpatient Medications Prior to Visit  Medication Sig Dispense Refill  . acetaminophen (TYLENOL) 500 MG tablet Take 500 mg by mouth 2 (two) times daily as needed for mild pain.     Marland Kitchen losartan (COZAAR) 50 MG tablet TAKE 1 TABLET (50 MG TOTAL) BY MOUTH EVERY EVENING. 90 tablet 1  . Multiple Vitamin (MULTIVITAMIN) tablet Take 1 tablet by mouth daily.    . naproxen (NAPROSYN) 500 MG tablet Take 500 mg by mouth daily as needed.   1  . hydrochlorothiazide (HYDRODIURIL) 12.5 MG tablet TAKE 1 TABLET BY MOUTH EVERY MORNING 90 tablet 1  . rosuvastatin (CRESTOR) 20 MG tablet Take 1 tablet (20 mg total) by mouth daily. 90 tablet 3   No facility-administered medications prior to visit.    Allergies  Allergen Reactions  . Lisinopril Other (See Comments)    dizzy  . Statins Other (See Comments)    Muscle weakness - atorvastatin    . Sulfa Antibiotics Other (See Comments)    "feels feverish"  . Tetracyclines & Related Other (See Comments)    GI upset  . Benadryl [Diphenhydramine] Other (See Comments)    Urinary incontinence stopped when med discontinued    Review of Systems  Constitutional: Negative for fever and malaise/fatigue.  HENT: Negative for congestion.   Eyes: Negative for blurred vision.  Respiratory: Negative for shortness of breath.   Cardiovascular: Negative for chest pain, palpitations and leg swelling.  Gastrointestinal: Negative for abdominal pain, blood in stool and nausea.  Genitourinary: Negative for dysuria and frequency.  Musculoskeletal: Negative for falls.  Skin: Negative for rash.  Neurological: Negative for dizziness, loss of consciousness and headaches.  Endo/Heme/Allergies: Negative for environmental allergies.  Psychiatric/Behavioral: Negative for depression. The patient is not nervous/anxious.        Objective:    Physical Exam Vitals and nursing note reviewed.  Constitutional:      General: She is not in acute distress.     Appearance: She is well-developed.  HENT:     Head: Normocephalic and atraumatic.     Nose: Nose normal.  Eyes:     General:        Right eye: No discharge.        Left eye: No discharge.  Cardiovascular:     Rate and Rhythm: Normal rate and regular rhythm.     Heart sounds: No murmur heard.   Pulmonary:     Effort: Pulmonary effort is normal.     Breath sounds: Normal breath sounds.  Abdominal:     General: Bowel sounds are normal.     Palpations: Abdomen is soft.     Tenderness: There is no abdominal tenderness.  Musculoskeletal:     Cervical back: Normal range of motion and neck supple.  Skin:    General: Skin is warm and dry.  Neurological:     Mental Status: She is alert and oriented to person, place, and time.     BP 110/64   Pulse 84   Temp 97.9 F (36.6 C)   Resp 16   Wt 153 lb 9.6 oz (69.7 kg)   SpO2 97%   BMI 26.37 kg/m  Wt Readings from Last 3 Encounters:  11/21/20 153 lb 9.6 oz (69.7 kg)  05/22/20 150 lb 12.8 oz (68.4 kg)  02/07/20 153 lb (69.4 kg)    Diabetic Foot Exam - Simple   No data filed    Lab Results  Component Value Date   WBC 5.4 11/21/2020   HGB 13.2 11/21/2020   HCT 39.4 11/21/2020   PLT 241.0 11/21/2020   GLUCOSE 87 11/21/2020   CHOL 156 11/21/2020   TRIG 141.0 11/21/2020   HDL 49.20 11/21/2020   LDLDIRECT 157.0 09/13/2019   LDLCALC 79 11/21/2020   ALT 13 11/21/2020   AST 18 11/21/2020   NA 139 11/21/2020   K 4.2 11/21/2020   CL 101 11/21/2020   CREATININE 0.93 11/21/2020   BUN 25 (H) 11/21/2020   CO2 29 11/21/2020   TSH 3.03 11/21/2020   HGBA1C 6.1 11/21/2020    Lab Results  Component Value Date   TSH 3.03 11/21/2020   Lab Results  Component Value Date   WBC 5.4 11/21/2020   HGB 13.2 11/21/2020   HCT 39.4 11/21/2020   MCV 88.2 11/21/2020   PLT 241.0 11/21/2020   Lab Results  Component Value Date   NA 139 11/21/2020   K 4.2 11/21/2020   CO2 29 11/21/2020   GLUCOSE 87 11/21/2020   BUN 25 (H) 11/21/2020    CREATININE 0.93 11/21/2020   BILITOT 0.5 11/21/2020   ALKPHOS 44 11/21/2020   AST 18 11/21/2020   ALT 13 11/21/2020   PROT 7.0 11/21/2020   ALBUMIN 4.3 11/21/2020   CALCIUM 9.8 11/21/2020   ANIONGAP 10 05/09/2017   GFR 58.87 (L) 11/21/2020   Lab Results  Component Value Date   CHOL 156 11/21/2020   Lab Results  Component Value Date   HDL 49.20 11/21/2020   Lab Results  Component Value Date   LDLCALC 79 11/21/2020   Lab Results  Component Value Date   TRIG 141.0 11/21/2020   Lab Results  Component Value Date   CHOLHDL 3 11/21/2020   Lab Results  Component Value Date   HGBA1C 6.1 11/21/2020       Assessment & Plan:   Problem List Items Addressed This Visit    Hypertension (Chronic)    Has been on the low end of normal lately, will try HCTZ stopping for now. Monitor bp      Relevant Orders   CBC (Completed)   Comprehensive metabolic panel (Completed)   TSH (Completed)   HLD (hyperlipidemia) (Chronic)    Tolerating statin, encouraged heart healthy diet, avoid trans fats, minimize simple carbs and saturated fats. Increase exercise as tolerated      Relevant Orders   Lipid panel (Completed)   Premature ventricular contractions (PVCs) (VPCs) (Chronic)    Notes occasional episodes after eating. Does not last more than seconds and no  associated symptoms. She follows with cardiology      CKD (chronic kidney disease), stage III ([place])    Hydrate and monitor      Hyperglycemia    hgba1c acceptable, minimize simple carbs. Increase exercise as tolerated.      Relevant Orders   Hemoglobin A1c (Completed)      I have discontinued Lelan Pons A. Mancuso's hydrochlorothiazide. I am also having her maintain her naproxen, acetaminophen, multivitamin, rosuvastatin, and losartan.  No orders of the defined types were placed in this encounter.    Penni Homans, MD

## 2020-11-25 ENCOUNTER — Other Ambulatory Visit: Payer: Self-pay | Admitting: Family Medicine

## 2020-12-24 ENCOUNTER — Encounter: Payer: Self-pay | Admitting: Family Medicine

## 2020-12-26 NOTE — Telephone Encounter (Signed)
Patient scheduled for 01/30/21 at 320pm

## 2021-01-26 ENCOUNTER — Other Ambulatory Visit: Payer: Self-pay | Admitting: Internal Medicine

## 2021-01-30 ENCOUNTER — Telehealth (INDEPENDENT_AMBULATORY_CARE_PROVIDER_SITE_OTHER): Payer: Medicare Other | Admitting: Family Medicine

## 2021-01-30 ENCOUNTER — Other Ambulatory Visit: Payer: Self-pay

## 2021-01-30 DIAGNOSIS — M542 Cervicalgia: Secondary | ICD-10-CM

## 2021-01-30 DIAGNOSIS — I10 Essential (primary) hypertension: Secondary | ICD-10-CM

## 2021-01-30 DIAGNOSIS — R739 Hyperglycemia, unspecified: Secondary | ICD-10-CM

## 2021-01-30 DIAGNOSIS — N183 Chronic kidney disease, stage 3 unspecified: Secondary | ICD-10-CM | POA: Diagnosis not present

## 2021-01-30 MED ORDER — LOSARTAN POTASSIUM 25 MG PO TABS: 25.0000 mg | ORAL_TABLET | Freq: Every day | ORAL | 1 refills | Status: DC

## 2021-01-31 NOTE — Assessment & Plan Note (Signed)
She struggles with chronic pain and she follows with Raliegh Ip.

## 2021-01-31 NOTE — Assessment & Plan Note (Signed)
hgba1c acceptable, minimize simple carbs. Increase exercise as tolerated.  

## 2021-01-31 NOTE — Progress Notes (Signed)
Virtual telephone visit    Virtual Visit via Telephone Note   This visit type was conducted due to national recommendations for restrictions regarding the COVID-19 Pandemic (e.g. social distancing) in an effort to limit this patient's exposure and mitigate transmission in our community. Due to her co-morbid illnesses, this patient is at least at moderate risk for complications without adequate follow up. This format is felt to be most appropriate for this patient at this time. The patient did not have access to video technology or had technical difficulties with video requiring transitioning to audio format only (telephone). Physical exam was limited to content and character of the telephone converstion. S Chism, CMA was able to get the patient set up on a telephone visit.   Patient location: home Patient and provider in visit Provider location: Office  I discussed the limitations of evaluation and management by telemedicine and the availability of in person appointments. The patient expressed understanding and agreed to proceed.   Visit Date: 01/30/2021  Today's healthcare provider: Penni Homans, MD     Subjective:    Patient ID: Laura [name], female    DOB: Sep 05, 1940, 80 y.o.   MRN: [id number]  Chief Complaint  Patient presents with   Follow-up   Hypertension    HPI Patient is in today for follow up on hypertension and other chronic concerns. She was having trouble with feeling light headed when she changed position and her blood pressure was dropping. She dropped her Losartan from 50 to 25 mg and she is feeling much better. She continues to struggle with daily pain most notably in her neck and thumb with stiffness as well. She is following with Ralie[facility] Ip. Denies CP/palp/SOB/HA/congestion/fevers/GI or GU c/o. Taking meds as prescribed   Past Medical History:  Diagnosis Date   Adenomatous colon polyp 03/2012   Cancer ([place])    basal on back and nose   Cataract     Chicken pox as a child   CKD (chronic kidney disease), stage III ([place]) 02/21/2017   DDD (degenerative disc disease), cervical    EBV infection    mono as child   Fibrocystic breast 05/01/2014   GERD (gastroesophageal reflux disease)    pt denies   Hyperlipidemia    Hypertension 20 yrs ago   Measles as a child   Medicare annual wellness visit, subsequent 02/01/2015   Mumps as a child   Neck pain 01/22/2016   Other and unspecified hyperlipidemia 10/13/2013   Overweight(278.02)    PONV (postoperative nausea and vomiting)    pt would like something to prevent nausea   Vasovagal reaction 07/15/2015   no syncope but close     Past Surgical History:  Procedure Laterality Date   ABDOMINAL HYSTERECTOMY  29 yrs ago   heavy bleeding, ovaries left in place   ANTERIOR CERVICAL DECOMP/DISCECTOMY FUSION N/A 05/15/2017   Procedure: ANTERIOR CERVICAL DECOMPRESSION/DISCECTOMY FUSION CERVICL THREE CERVICAL FOUR, CERVICAL FOUR- CERVICAL FIVE;  Surgeon: Ashok Pall, MD;  Location: [facility];  Service: Neurosurgery;  Laterality: N/A;   CATARACT EXTRACTION, BILATERAL Bilateral 2012   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  Exercise 4:30 min (4.6 METS)-she reached peak heart rate 146 bpm which is 96% max protected.  No EKG changes.  Normal wall motion. No evidence of ischemia or infarction.    POLYPECTOMY  TRANSTHORACIC ECHOCARDIOGRAM  01/2017    EF 60 to 65%.  GR 1 DD.  Normal wall motion.  Normal atrial sizes.  Mildly dilated IVC.   VAGINAL HYSTERECTOMY  80 yrs old   uterus removed    Family History  Problem Relation Age of Onset   Heart disease Father    Heart attack Father 64   Hypertension Father    Dementia Maternal Grandmother    Heart disease Maternal Grandfather    Hyperlipidemia Paternal Grandmother    Cancer Paternal Grandfather        stomach   Heart attack Paternal Grandfather     Stomach cancer Paternal Grandfather    Dementia Mother    Pulmonary embolism Son    Vasculitis Son    Other Son        vasculitis led to PE   Colon cancer Neg Hx    Colon polyps Neg Hx    Rectal cancer Neg Hx     Social History   Socioeconomic History   Marital status: Married    Spouse name: Not on file   Number of children: Not on file   Years of education: Not on file   Highest education level: Not on file  Occupational History   Not on file  Tobacco Use   Smoking status: Former    Packs/day: 1.00    Years: 16.00    Pack years: 16.00    Types: Cigarettes    Start date: 07/29/1972   Smokeless tobacco: Never  Substance and Sexual Activity   Alcohol use: No    Alcohol/week: 0.0 standard drinks   Drug use: No   Sexual activity: Never    Comment: lives with husband no major dietary restrictions  Other Topics Concern   Not on file  Social History Narrative   Not on file   Social Determinants of Health   Financial Resource Strain: Not on file  Food Insecurity: Not on file  Transportation Needs: Not on file  Physical Activity: Not on file  Stress: Not on file  Social Connections: Not on file  Intimate Partner Violence: Not on file    Outpatient Medications Prior to Visit  Medication Sig Dispense Refill   acetaminophen (TYLENOL) 500 MG tablet Take 500 mg by mouth 2 (two) times daily as needed for mild pain.      hydrochlorothiazide (HYDRODIURIL) 12.5 MG tablet TAKE 1 TABLET BY MOUTH EVERY MORNING 90 tablet 1   Multiple Vitamin (MULTIVITAMIN) tablet Take 1 tablet by mouth daily.     naproxen (NAPROSYN) 500 MG tablet Take 500 mg by mouth daily as needed.   1   rosuvastatin (CRESTOR) 20 MG tablet TAKE 1 TABLET BY MOUTH EVERY DAY 90 tablet 0   losartan (COZAAR) 50 MG tablet TAKE 1 TABLET (50 MG TOTAL) BY MOUTH EVERY EVENING. 90 tablet 1   No facility-administered medications prior to visit.    Allergies  Allergen Reactions   Lisinopril Other (See Comments)     dizzy   Statins Other (See Comments)    Muscle weakness - atorvastatin     Sulfa Antibiotics Other (See Comments)    "feels feverish"   Tetracyclines & Related Other (See Comments)    GI upset   Benadryl [Diphenhydramine] Other (See Comments)    Urinary incontinence stopped when med discontinued    Review of Systems  Constitutional:  Positive for malaise/fatigue. Negative for fever.  HENT:  Negative for congestion.   Eyes:  Negative for blurred vision.  Respiratory:  Negative for shortness of breath.   Cardiovascular:  Negative for chest pain, palpitations and leg swelling.  Gastrointestinal:  Negative for abdominal pain, blood in stool and nausea.  Genitourinary:  Negative for dysuria and frequency.  Musculoskeletal:  Positive for joint pain, myalgias and neck pain. Negative for falls.  Skin:  Negative for rash.  Neurological:  Negative for dizziness, loss of consciousness and headaches.  Endo/Heme/Allergies:  Negative for environmental allergies.  Psychiatric/Behavioral:  Negative for depression. The patient is not nervous/anxious.       Objective:    Physical Exam unable to obtain via phone visit  There were no vitals taken for this visit. Wt Readings from Last 3 Encounters:  11/21/20 153 lb 9.6 oz (69.7 kg)  05/22/20 150 lb 12.8 oz (68.4 kg)  02/07/20 153 lb (69.4 kg)    Diabetic Foot Exam - Simple   No data filed    Lab Results  Component Value Date   WBC 5.4 11/21/2020   HGB 13.2 11/21/2020   HCT 39.4 11/21/2020   PLT 241.0 11/21/2020   GLUCOSE 87 11/21/2020   CHOL 156 11/21/2020   TRIG 141.0 11/21/2020   HDL 49.20 11/21/2020   LDLDIRECT 157.0 09/13/2019   LDLCALC 79 11/21/2020   ALT 13 11/21/2020   AST 18 11/21/2020   NA 139 11/21/2020   K 4.2 11/21/2020   CL 101 11/21/2020   CREATININE 0.93 11/21/2020   BUN 25 (H) 11/21/2020   CO2 29 11/21/2020   TSH 3.03 11/21/2020   HGBA1C 6.1 11/21/2020    Lab Results  Component Value Date   TSH 3.03  11/21/2020   Lab Results  Component Value Date   WBC 5.4 11/21/2020   HGB 13.2 11/21/2020   HCT 39.4 11/21/2020   MCV 88.2 11/21/2020   PLT 241.0 11/21/2020   Lab Results  Component Value Date   NA 139 11/21/2020   K 4.2 11/21/2020   CO2 29 11/21/2020   GLUCOSE 87 11/21/2020   BUN 25 (H) 11/21/2020   CREATININE 0.93 11/21/2020   BILITOT 0.5 11/21/2020   ALKPHOS 44 11/21/2020   AST 18 11/21/2020   ALT 13 11/21/2020   PROT 7.0 11/21/2020   ALBUMIN 4.3 11/21/2020   CALCIUM 9.8 11/21/2020   ANIONGAP 10 05/09/2017   GFR 58.87 (L) 11/21/2020   Lab Results  Component Value Date   CHOL 156 11/21/2020   Lab Results  Component Value Date   HDL 49.20 11/21/2020   Lab Results  Component Value Date   LDLCALC 79 11/21/2020   Lab Results  Component Value Date   TRIG 141.0 11/21/2020   Lab Results  Component Value Date   CHOLHDL 3 11/21/2020   Lab Results  Component Value Date   HGBA1C 6.1 11/21/2020       Assessment & Plan:   Problem List Items Addressed This Visit     Hypertension (Chronic)    She feels much better since her Losartan was decreased from 50 mg to 25 mg daily. No further episodes of dizziness. Her BP has been running between 117/65 to 131/78. No further changes       Relevant Medications   losartan (COZAAR) 25 MG tablet   Neck pain    She struggles with chronic pain and she follows with Ralie[facility] Ip.        CKD (chronic kidney disease), stage III ([place])    Hydrate and monitor       Hyperglycemia    hgba1c  acceptable, minimize simple carbs. Increase exercise as tolerated.         I have discontinued Orie Fisherman. Kirwan's losartan. I am also having her start on losartan. Additionally, I am having her maintain her naproxen, acetaminophen, multivitamin, hydrochlorothiazide, and rosuvastatin.  Meds ordered this encounter  Medications   losartan (COZAAR) 25 MG tablet    Sig: Take 1 tablet (25 mg total) by mouth daily.    Dispense:  90  tablet    Refill:  1     I discussed the assessment and treatment plan with the patient. The patient was provided an opportunity to ask questions and all were answered. The patient agreed with the plan and demonstrated an understanding of the instructions.   The patient was advised to call back or seek an in-person evaluation if the symptoms worsen or if the condition fails to improve as anticipated.  I provided 21 minutes of non-face-to-face time during this encounter.   Penni Homans, MD [facility] at [facility] [phone] (phone) [phone] (fax)  [place]

## 2021-01-31 NOTE — Assessment & Plan Note (Signed)
Hydrate and monitor 

## 2021-01-31 NOTE — Assessment & Plan Note (Signed)
She feels much better since her Losartan was decreased from 50 mg to 25 mg daily. No further episodes of dizziness. Her BP has been running between 117/65 to 131/78. No further changes

## 2021-02-01 ENCOUNTER — Encounter: Payer: Self-pay | Admitting: Family Medicine

## 2021-02-05 DIAGNOSIS — M25532 Pain in left wrist: Secondary | ICD-10-CM | POA: Diagnosis not present

## 2021-02-08 ENCOUNTER — Encounter: Payer: Self-pay | Admitting: Gastroenterology

## 2021-02-20 ENCOUNTER — Telehealth: Payer: Self-pay | Admitting: Internal Medicine

## 2021-02-20 NOTE — Telephone Encounter (Signed)
Ok to make appt later, to allow her to have the CT scan - she can get that at [facility].  Dr Lemmie Evens

## 2021-02-20 NOTE — Telephone Encounter (Signed)
Okay to move this appointment until end of August? Or September- per last note was doing repeat of lung nodule that was noticed on cardiac score.  Thanks!

## 2021-02-20 NOTE — Telephone Encounter (Signed)
New Message:     Patient wants to know if it is necessary for her  to have the CT on  02-26-21?  If t is, she would like to have t postponed until  the end of August or the first of September please. She also would like to know if she can have it at the [facility] in [facility]?

## 2021-02-21 ENCOUNTER — Other Ambulatory Visit: Payer: Self-pay

## 2021-02-21 DIAGNOSIS — R911 Solitary pulmonary nodule: Secondary | ICD-10-CM

## 2021-02-21 NOTE — Telephone Encounter (Signed)
Called patient, advised okay to wait for CT. Reordered for [facility]. Patient will cancel the upcoming appointment.  Patient verbalized understanding.

## 2021-02-27 ENCOUNTER — Other Ambulatory Visit: Payer: Medicare Other

## 2021-02-27 ENCOUNTER — Ambulatory Visit (HOSPITAL_BASED_OUTPATIENT_CLINIC_OR_DEPARTMENT_OTHER): Payer: Medicare Other

## 2021-04-16 DIAGNOSIS — Z23 Encounter for immunization: Secondary | ICD-10-CM | POA: Diagnosis not present

## 2021-04-25 ENCOUNTER — Telehealth: Payer: Self-pay | Admitting: Family Medicine

## 2021-04-25 NOTE — Telephone Encounter (Signed)
Patient mentioned she has a headache and sinus infection and wants to know if any meds can be prescribed until her appointment on 9/30. Please advise.

## 2021-04-26 NOTE — Telephone Encounter (Signed)
Pt is aware and is covid neg

## 2021-04-27 ENCOUNTER — Ambulatory Visit (INDEPENDENT_AMBULATORY_CARE_PROVIDER_SITE_OTHER): Payer: Medicare Other | Admitting: Family

## 2021-04-27 ENCOUNTER — Other Ambulatory Visit: Payer: Self-pay

## 2021-04-27 VITALS — BP 149/84 | HR 70 | Temp 98.2°F | Resp 16 | Ht 64.0 in

## 2021-04-27 DIAGNOSIS — R0981 Nasal congestion: Secondary | ICD-10-CM | POA: Diagnosis not present

## 2021-04-27 DIAGNOSIS — H6121 Impacted cerumen, right ear: Secondary | ICD-10-CM | POA: Diagnosis not present

## 2021-04-27 NOTE — Assessment & Plan Note (Addendum)
New. Suspect allergies are a contributing factor.   Recommended the following:    Please continue mucinex 600mg  twice daily. Add claritin 10mg  once daily. Add flonase 2 sprays each nostril once daily. Call if new/worsening symptoms or if symptoms are not improved in 3-4 days.  Discussed that she is unlikely to benefit from abx at this time but if symptoms worsen or fail to improve in 3-4 days would consider initiating antibiotic.

## 2021-04-27 NOTE — Patient Instructions (Signed)
Please continue mucinex 600mg  twice daily. Add claritin 10mg  once daily. Add flonase 2 sprays each nostril once daily. Call if new/worsening symptoms or if symptoms are not improved in 3-4 days.

## 2021-04-27 NOTE — Progress Notes (Signed)
Subjective:     Patient ID: Laura [name], female    DOB: Dec 14, 1940, 80 y.o.   MRN: [id number]  Chief Complaint  Patient presents with   Facial Pain    Complains of sinus pain    Ear Fullness    Patient complains of left ear fullness    HPI Patient is in today for to discuss head congestion, left ear "crackling" and then some on the right.  Facial pain with bending forward. She has been taking mucinex bid, acetaminophen.  Symptoms began 3 days ago. She states that she had a flu shot on 9/19 and felt sick the next day.  Notes she does not hear that well out of her right ear.  Health Maintenance Due  Topic Date Due   Hepatitis C Screening  Never done   Zoster Vaccines- Shingrix (1 of 2) Never done   TETANUS/TDAP  07/29/2020   COLONOSCOPY (Pts 45-72yrs Insurance coverage will need to be confirmed)  09/18/2020   COVID-19 Vaccine (5 - Booster for Moderna series) 03/15/2021    Past Medical History:  Diagnosis Date   Adenomatous colon polyp 03/2012   Cancer ([place])    basal on back and nose   Cataract    Chicken pox as a child   CKD (chronic kidney disease), stage III ([place]) 02/21/2017   DDD (degenerative disc disease), cervical    EBV infection    mono as child   Fibrocystic breast 05/01/2014   GERD (gastroesophageal reflux disease)    pt denies   Hyperlipidemia    Hypertension 20 yrs ago   Measles as a child   Medicare annual wellness visit, subsequent 02/01/2015   Mumps as a child   Neck pain 01/22/2016   Other and unspecified hyperlipidemia 10/13/2013   Overweight(278.02)    PONV (postoperative nausea and vomiting)    pt would like something to prevent nausea   Vasovagal reaction 07/15/2015   no syncope but close     Past Surgical History:  Procedure Laterality Date   ABDOMINAL HYSTERECTOMY  29 yrs ago   heavy bleeding, ovaries left in place   ANTERIOR CERVICAL DECOMP/DISCECTOMY FUSION N/A 05/15/2017   Procedure: ANTERIOR CERVICAL DECOMPRESSION/DISCECTOMY FUSION  CERVICL THREE CERVICAL FOUR, CERVICAL FOUR- CERVICAL FIVE;  Surgeon: Ashok Pall, MD;  Location: [facility];  Service: Neurosurgery;  Laterality: N/A;   CATARACT EXTRACTION, BILATERAL Bilateral 2012   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  Exercise 4:30 min (4.6 METS)-she reached peak heart rate 146 bpm which is 96% max protected.  No EKG changes.  Normal wall motion. No evidence of ischemia or infarction.    POLYPECTOMY     TRANSTHORACIC ECHOCARDIOGRAM  01/2017    EF 60 to 65%.  GR 1 DD.  Normal wall motion.  Normal atrial sizes.  Mildly dilated IVC.   VAGINAL HYSTERECTOMY  81 yrs old   uterus removed    Family History  Problem Relation Age of Onset   Heart disease Father    Heart attack Father 80   Hypertension Father    Dementia Maternal Grandmother    Heart disease Maternal Grandfather    Hyperlipidemia Paternal Grandmother    Cancer Paternal Grandfather        stomach   Heart attack Paternal Grandfather    Stomach cancer Paternal Grandfather    Dementia  Mother    Pulmonary embolism Son    Vasculitis Son    Other Son        vasculitis led to PE   Colon cancer Neg Hx    Colon polyps Neg Hx    Rectal cancer Neg Hx     Social History   Socioeconomic History   Marital status: Married    Spouse name: Not on file   Number of children: Not on file   Years of education: Not on file   Highest education level: Not on file  Occupational History   Not on file  Tobacco Use   Smoking status: Former    Packs/day: 1.00    Years: 16.00    Pack years: 16.00    Types: Cigarettes    Start date: 07/29/1972   Smokeless tobacco: Never  Substance and Sexual Activity   Alcohol use: No    Alcohol/week: 0.0 standard drinks   Drug use: No   Sexual activity: Never    Comment: lives with husband no major dietary restrictions  Other Topics Concern   Not on file  Social History  Narrative   Not on file   Social Determinants of Health   Financial Resource Strain: Not on file  Food Insecurity: Not on file  Transportation Needs: Not on file  Physical Activity: Not on file  Stress: Not on file  Social Connections: Not on file  Intimate Partner Violence: Not on file    Outpatient Medications Prior to Visit  Medication Sig Dispense Refill   acetaminophen (TYLENOL) 500 MG tablet Take 500 mg by mouth 2 (two) times daily as needed for mild pain.      hydrochlorothiazide (HYDRODIURIL) 12.5 MG tablet TAKE 1 TABLET BY MOUTH EVERY MORNING 90 tablet 1   losartan (COZAAR) 25 MG tablet Take 1 tablet (25 mg total) by mouth daily. 90 tablet 1   Multiple Vitamin (MULTIVITAMIN) tablet Take 1 tablet by mouth daily.     naproxen (NAPROSYN) 500 MG tablet Take 500 mg by mouth daily as needed.   1   rosuvastatin (CRESTOR) 20 MG tablet TAKE 1 TABLET BY MOUTH EVERY DAY 90 tablet 0   No facility-administered medications prior to visit.    Allergies  Allergen Reactions   Lisinopril Other (See Comments)    dizzy   Statins Other (See Comments)    Muscle weakness - atorvastatin     Sulfa Antibiotics Other (See Comments)    "feels feverish"   Tetracyclines & Related Other (See Comments)    GI upset   Benadryl [Diphenhydramine] Other (See Comments)    Urinary incontinence stopped when med discontinued    ROS See HPI    Objective:    Physical Exam Constitutional:      General: She is not in acute distress.    Appearance: Normal appearance. She is well-developed.  HENT:     Head: Normocephalic and atraumatic.     Right Ear: External ear normal. There is impacted cerumen.     Left Ear: Tympanic membrane and external ear normal.  Eyes:     General: No scleral icterus. Neck:     Thyroid: No thyromegaly.  Cardiovascular:     Rate and Rhythm: Normal rate and regular rhythm.     Heart sounds: Normal heart sounds. No murmur heard. Pulmonary:     Effort: Pulmonary effort  is normal. No respiratory distress.     Breath sounds: Normal breath sounds. No wheezing.  Musculoskeletal:  Cervical back: Neck supple.  Skin:    General: Skin is warm and dry.  Neurological:     Mental Status: She is alert and oriented to person, place, and time.  Psychiatric:        Mood and Affect: Mood normal.        Behavior: Behavior normal.        Thought Content: Thought content normal.        Judgment: Judgment normal.    BP (!) 149/84 (BP Location: Right Arm, Patient Position: Sitting, Cuff Size: Small)   Pulse 70   Temp 98.2 F (36.8 C) (Oral)   Resp 16   Ht 5\' 4"  (1.626 m)   SpO2 95%   BMI 26.37 kg/m  Wt Readings from Last 3 Encounters:  11/21/20 153 lb 9.6 oz (69.7 kg)  05/22/20 150 lb 12.8 oz (68.4 kg)  02/07/20 153 lb (69.4 kg)       Assessment & Plan:   Problem List Items Addressed This Visit       Unprioritized   Sinus congestion - Primary    New.  Recommended the following:    Please continue mucinex 600mg  twice daily. Add claritin 10mg  once daily. Add flonase 2 sprays each nostril once daily. Call if new/worsening symptoms or if symptoms are not improved in 3-4 days.  Discussed that she is unlikely to benefit from abx at this time but if symptoms worsen or fail to improve in 3-4 days would consider initiating antibiotic.       Impacted cerumen of right ear    Attempted to remove cerumen with irrigation and curette without success. Cerumen is very deep/hard and packed in her ear.  Patient did experience some dizziness following the procedure.  I waited with the patient until the dizziness subsided. Discussed use of ceruminex/debrox kits at home to soften/remove the wax.       20 minutes spent on today's visit.  The majority of the time was spent counseling the patient and working on cerumen removal.   I am having Laura [name] maintain her naproxen, acetaminophen, multivitamin, hydrochlorothiazide, rosuvastatin, and losartan.  No  orders of the defined types were placed in this encounter.

## 2021-04-27 NOTE — Assessment & Plan Note (Signed)
Attempted to remove cerumen with irrigation and curette without success. Cerumen is very deep/hard and packed in her ear.  Patient did experience some dizziness following the procedure.  I waited with the patient until the dizziness subsided. Discussed use of ceruminex/debrox kits at home to soften/remove the wax.

## 2021-04-29 ENCOUNTER — Encounter: Payer: Self-pay | Admitting: Family

## 2021-04-29 MED ORDER — AMOXICILLIN 500 MG PO CAPS: 500.0000 mg | ORAL_CAPSULE | Freq: Three times a day (TID) | ORAL | 0 refills | Status: AC

## 2021-04-30 ENCOUNTER — Ambulatory Visit: Payer: Medicare Other | Admitting: Medical

## 2021-05-10 ENCOUNTER — Other Ambulatory Visit: Payer: Self-pay

## 2021-05-10 ENCOUNTER — Ambulatory Visit (INDEPENDENT_AMBULATORY_CARE_PROVIDER_SITE_OTHER): Payer: Medicare Other | Admitting: Family Medicine

## 2021-05-10 VITALS — BP 124/66 | HR 77 | Temp 98.3°F | Resp 16

## 2021-05-10 DIAGNOSIS — E782 Mixed hyperlipidemia: Secondary | ICD-10-CM

## 2021-05-10 DIAGNOSIS — R739 Hyperglycemia, unspecified: Secondary | ICD-10-CM | POA: Diagnosis not present

## 2021-05-10 DIAGNOSIS — S41112A Laceration without foreign body of left upper arm, initial encounter: Secondary | ICD-10-CM | POA: Diagnosis not present

## 2021-05-10 DIAGNOSIS — Z23 Encounter for immunization: Secondary | ICD-10-CM

## 2021-05-10 DIAGNOSIS — I10 Essential (primary) hypertension: Secondary | ICD-10-CM | POA: Diagnosis not present

## 2021-05-10 DIAGNOSIS — Z1159 Encounter for screening for other viral diseases: Secondary | ICD-10-CM

## 2021-05-10 DIAGNOSIS — N183 Chronic kidney disease, stage 3 unspecified: Secondary | ICD-10-CM | POA: Diagnosis not present

## 2021-05-10 DIAGNOSIS — H6121 Impacted cerumen, right ear: Secondary | ICD-10-CM | POA: Diagnosis not present

## 2021-05-10 DIAGNOSIS — R0981 Nasal congestion: Secondary | ICD-10-CM

## 2021-05-10 LAB — HEMOGLOBIN A1C: Hgb A1c MFr Bld: 6.1 % (ref 4.6–6.5)

## 2021-05-10 NOTE — Assessment & Plan Note (Signed)
hgba1c acceptable, minimize simple carbs. Increase exercise as tolerated.  

## 2021-05-10 NOTE — Progress Notes (Signed)
Patient ID: Laura [name], female    DOB: Jul 05, 1941  Age: 81 y.o. MRN: [id number]    Subjective:   No chief complaint on file.  Subjective   HPI Laura [name] presents for office visit today for follow up on HTN and recent HA's accompanied with congestion. Laura [name] is still taking mucinex and tylenol for the pain, but overall Laura [name] is feeling a lot better. Laura [name] states that yesterday Laura [name] had pain behind her ears. Hot flashes might be contributing to her episodes. Laura [name] denies any loud noises, nausea, vomiting, or visual disturbances during episodes. Denies CP/palp/SOB/congestion/fevers/GI or GU c/o. Taking meds as prescribed.  Laura [name] has history of migraines with aura which also causes her visual disturbances. Laura [name] has a skin laceration on left arm due to cat scratch.   Review of Systems  Constitutional:  Negative for chills, fatigue and fever.  HENT:  Positive for postnasal drip. Negative for congestion, rhinorrhea, sinus pressure, sinus pain and sore throat.   Eyes:  Negative for pain.  Respiratory:  Negative for cough and shortness of breath.   Cardiovascular:  Negative for chest pain, palpitations and leg swelling.  Gastrointestinal:  Negative for abdominal pain, blood in stool, diarrhea, nausea and vomiting.  Genitourinary:  Negative for decreased urine volume, flank pain, frequency, vaginal bleeding and vaginal discharge.  Musculoskeletal:  Negative for back pain.  Skin:  Positive for wound.  Neurological:  Positive for headaches.   History Past Medical History:  Diagnosis Date   Adenomatous colon polyp 03/2012   Cancer ([place])    basal on back and nose   Cataract    Chicken pox as a child   CKD (chronic kidney disease), stage III ([place]) 02/21/2017   DDD (degenerative disc disease), cervical    EBV infection    mono as child   Fibrocystic breast 05/01/2014   GERD (gastroesophageal reflux disease)    pt denies   Hyperlipidemia    Hypertension 20 yrs ago   Measles as a child    Medicare annual wellness visit, subsequent 02/01/2015   Mumps as a child   Neck pain 01/22/2016   Other and unspecified hyperlipidemia 10/13/2013   Overweight(278.02)    PONV (postoperative nausea and vomiting)    pt would like something to prevent nausea   Vasovagal reaction 07/15/2015   no syncope but close     Laura [name] has a past surgical history that includes Vaginal hysterectomy (80 yrs old); Eye surgery (11-27-2010); Cataract extraction, bilateral (Bilateral, 2012); Abdominal hysterectomy (29 yrs ago); Colonoscopy; Polypectomy; Anterior cervical decomp/discectomy fusion (N/A, 05/15/2017); transthoracic echocardiogram (01/2017); and NM MYOVIEW LTD (04/2017).   Her family history includes Cancer in her paternal grandfather; Dementia in her maternal grandmother and mother; Heart attack in her paternal grandfather; Heart attack (age of onset: 85) in her father; Heart disease in her father and maternal grandfather; Hyperlipidemia in her paternal grandmother; Hypertension in her father; Other in her son; Pulmonary embolism in her son; Stomach cancer in her paternal grandfather; Vasculitis in her son.Laura [name] reports that Laura [name] has quit smoking. Her smoking use included cigarettes. Laura [name] started smoking about 48 years ago. Laura [name] has a 16.00 pack-year smoking history. Laura [name] has never used smokeless tobacco. Laura [name] reports that Laura [name] does not drink alcohol and does not use drugs.  Current Outpatient Medications on File Prior to Visit  Medication Sig Dispense Refill   acetaminophen (TYLENOL) 500 MG tablet Take 500 mg by mouth 2 (two) times daily as needed for mild pain.  hydrochlorothiazide (HYDRODIURIL) 12.5 MG tablet TAKE 1 TABLET BY MOUTH EVERY MORNING 90 tablet 1   losartan (COZAAR) 25 MG tablet Take 1 tablet (25 mg total) by mouth daily. 90 tablet 1   Multiple Vitamin (MULTIVITAMIN) tablet Take 1 tablet by mouth daily.     naproxen (NAPROSYN) 500 MG tablet Take 500 mg by mouth daily as needed.   1   rosuvastatin  (CRESTOR) 20 MG tablet TAKE 1 TABLET BY MOUTH EVERY DAY 90 tablet 0   No current facility-administered medications on file prior to visit.     Objective:  Objective  Physical Exam Constitutional:      General: Laura [name] is not in acute distress.    Appearance: Normal appearance. Laura [name] is not ill-appearing or toxic-appearing.  HENT:     Head: Normocephalic and atraumatic.     Right Ear: Tympanic membrane, ear canal and external ear normal.     Left Ear: Tympanic membrane, ear canal and external ear normal.     Nose: No congestion or rhinorrhea.  Eyes:     Extraocular Movements: Extraocular movements intact.     Pupils: Pupils are equal, round, and reactive to light.  Cardiovascular:     Rate and Rhythm: Normal rate and regular rhythm.     Pulses: Normal pulses.     Heart sounds: Normal heart sounds. No murmur heard. Pulmonary:     Effort: Pulmonary effort is normal. No respiratory distress.     Breath sounds: Normal breath sounds. No wheezing, rhonchi or rales.  Abdominal:     General: Bowel sounds are normal.     Palpations: Abdomen is soft. There is no mass.     Tenderness: There is no abdominal tenderness. There is no guarding.     Hernia: No hernia is present.  Musculoskeletal:        General: Normal range of motion.     Cervical back: Normal range of motion and neck supple.  Skin:    General: Skin is warm and dry.  Neurological:     Mental Status: Laura [name] is alert and oriented to person, place, and time.  Psychiatric:        Behavior: Behavior normal.   BP 124/66   Pulse 77   Temp 98.3 F (36.8 C)   Resp 16   SpO2 97%  Wt Readings from Last 3 Encounters:  11/21/20 153 lb 9.6 oz (69.7 kg)  05/22/20 150 lb 12.8 oz (68.4 kg)  02/07/20 153 lb (69.4 kg)     Lab Results  Component Value Date   WBC 5.4 11/21/2020   HGB 13.2 11/21/2020   HCT 39.4 11/21/2020   PLT 241.0 11/21/2020   GLUCOSE 87 11/21/2020   CHOL 156 11/21/2020   TRIG 141.0 11/21/2020   HDL 49.20  11/21/2020   LDLDIRECT 157.0 09/13/2019   LDLCALC 79 11/21/2020   ALT 13 11/21/2020   AST 18 11/21/2020   NA 139 11/21/2020   K 4.2 11/21/2020   CL 101 11/21/2020   CREATININE 0.93 11/21/2020   BUN 25 (H) 11/21/2020   CO2 29 11/21/2020   TSH 3.03 11/21/2020   HGBA1C 6.1 11/21/2020    MM 3D SCREEN BREAST BILATERAL  Result Date: 08/17/2020 CLINICAL DATA:  Screening. EXAM: DIGITAL SCREENING BILATERAL MAMMOGRAM WITH TOMO AND CAD COMPARISON:  Previous exam(s). ACR Breast Density Category b: There are scattered areas of fibroglandular density. FINDINGS: There are no findings suspicious for malignancy. Images were processed with CAD. IMPRESSION: No mammographic evidence of malignancy. A  result letter of this screening mammogram will be mailed directly to the patient. RECOMMENDATION: Screening mammogram in one year. (Code:SM-B-01Y) BI-RADS CATEGORY  1: Negative. Electronically Signed   By: Everlean Alstrom M.D.   On: 08/17/2020 10:20     Assessment & Plan:  Plan    No orders of the defined types were placed in this encounter.   Problem List Items Addressed This Visit     Hypertension - Primary (Chronic)    Well controlled, no changes to meds. Encouraged heart healthy diet such as the DASH diet and exercise as tolerated.       Relevant Orders   CBC   Comprehensive metabolic panel   TSH   HLD (hyperlipidemia) (Chronic)   Relevant Orders   Lipid panel   CKD (chronic kidney disease), stage III ([place])    Hydrate and monitor      Hyperglycemia    hgba1c acceptable, minimize simple carbs. Increase exercise as tolerated.       Relevant Orders   Hemoglobin A1c   Sinus congestion    Left sided sinus and ear pressure for past week or so is better today and Laura [name] feels could be related to allergies and weather fronts. No changes today      Impacted cerumen of right ear    Some wax but not impacted      Arm laceration, left, initial encounter    Laura [name] was scratched by her cat and  while it does not show any signs of secondary infection we will give a Tdap today as it has been more than 10 years since her last shot      Relevant Orders   Tdap vaccine greater than or equal to 7yo IM (Completed)   Other Visit Diagnoses     Encounter for hepatitis C screening test for low risk patient       Relevant Orders   Hepatitis C Antibody   Need for Tdap vaccination       Relevant Orders   Tdap vaccine greater than or equal to 7yo IM (Completed)       Follow-up: Return in about 6 months (around 11/08/2021) for f/u visit.  I, Suezanne Jacquet, acting as a scribe for Penni Homans, MD, have documented all relevent documentation on behalf of Penni Homans, MD, as directed by Penni Homans, MD while in the presence of Penni Homans, MD. DO:05/10/21.  I, [name], MD personally performed the services described in this documentation. All medical record entries made by the scribe were at my direction and in my presence. I have reviewed the chart and agree that the record reflects my personal performance and is accurate and complete

## 2021-05-10 NOTE — Assessment & Plan Note (Signed)
She was scratched by her cat and while it does not show any signs of secondary infection we will give a Tdap today as it has been more than 10 years since her last shot

## 2021-05-10 NOTE — Assessment & Plan Note (Signed)
Left sided sinus and ear pressure for past week or so is better today and she feels could be related to allergies and weather fronts. No changes today

## 2021-05-10 NOTE — Assessment & Plan Note (Signed)
Well controlled, no changes to meds. Encouraged heart healthy diet such as the DASH diet and exercise as tolerated.  °

## 2021-05-10 NOTE — Assessment & Plan Note (Signed)
Some wax but not impacted

## 2021-05-10 NOTE — Assessment & Plan Note (Signed)
Hydrate and monitor 

## 2021-05-10 NOTE — Patient Instructions (Addendum)
Bivalent covid booster available downstairs at the [facility] Mon-Fri, 9am-3pm with walk-ins available or check other pharmacies.  Think about getting the shingles shot in 2023. Shingrix is the new shingles shot, 2 shots over 2-6 months, confirm coverage with insurance and document, then can return here for shots with nurse appt or at pharmacy.

## 2021-05-11 LAB — HEPATITIS C ANTIBODY
Hepatitis C Ab: NONREACTIVE
SIGNAL TO CUT-OFF: 0.04 (ref <1.00)

## 2021-05-14 ENCOUNTER — Other Ambulatory Visit (INDEPENDENT_AMBULATORY_CARE_PROVIDER_SITE_OTHER): Payer: Medicare Other

## 2021-05-14 ENCOUNTER — Other Ambulatory Visit: Payer: Self-pay

## 2021-05-14 DIAGNOSIS — I10 Essential (primary) hypertension: Secondary | ICD-10-CM | POA: Diagnosis not present

## 2021-05-14 DIAGNOSIS — E782 Mixed hyperlipidemia: Secondary | ICD-10-CM | POA: Diagnosis not present

## 2021-05-14 LAB — COMPREHENSIVE METABOLIC PANEL
ALT: 18 U/L (ref 0–35)
AST: 21 U/L (ref 0–37)
Albumin: 4.2 g/dL (ref 3.5–5.2)
Alkaline Phosphatase: 41 U/L (ref 39–117)
BUN: 15 mg/dL (ref 6–23)
CO2: 30 mEq/L (ref 19–32)
Calcium: 9.7 mg/dL (ref 8.4–10.5)
Chloride: 103 mEq/L (ref 96–112)
Creatinine, Ser: 0.89 mg/dL (ref 0.40–1.20)
GFR: 61.85 mL/min (ref >60.00)
Glucose, Bld: 82 mg/dL (ref 70–99)
Potassium: 3.9 mEq/L (ref 3.5–5.1)
Sodium: 140 mEq/L (ref 135–145)
Total Bilirubin: 0.4 mg/dL (ref 0.2–1.2)
Total Protein: 6.5 g/dL (ref 6.0–8.3)

## 2021-05-14 LAB — LIPID PANEL
Cholesterol: 165 mg/dL (ref 0–200)
HDL: 52.9 mg/dL (ref >39.00)
LDL Cholesterol: 82 mg/dL (ref 0–99)
NonHDL: 112.38
Total CHOL/HDL Ratio: 3
Triglycerides: 150 mg/dL — ABNORMAL HIGH (ref 0.0–149.0)
VLDL: 30 mg/dL (ref 0.0–40.0)

## 2021-05-14 LAB — CBC
HCT: 38.6 % (ref 36.0–46.0)
Hemoglobin: 12.9 g/dL (ref 12.0–15.0)
MCHC: 33.5 g/dL (ref 30.0–36.0)
MCV: 88.6 fl (ref 78.0–100.0)
Platelets: 218 10*3/uL (ref 150.0–400.0)
RBC: 4.36 Mil/uL (ref 3.87–5.11)
RDW: 13.5 % (ref 11.5–15.5)
WBC: 4.9 10*3/uL (ref 4.0–10.5)

## 2021-05-14 LAB — TSH: TSH: 3.4 u[IU]/mL (ref 0.35–5.50)

## 2021-05-22 ENCOUNTER — Encounter: Payer: Self-pay | Admitting: Family Medicine

## 2021-05-24 ENCOUNTER — Other Ambulatory Visit: Payer: Self-pay

## 2021-05-24 ENCOUNTER — Ambulatory Visit (HOSPITAL_BASED_OUTPATIENT_CLINIC_OR_DEPARTMENT_OTHER)
Admission: RE | Admit: 2021-05-24 | Discharge: 2021-05-24 | Disposition: A | Discharge status: Home / Self Care | Payer: Medicare Other | Source: Ambulatory Visit | Attending: Internal Medicine | Admitting: Internal Medicine

## 2021-05-24 DIAGNOSIS — I700 Atherosclerosis of aorta: Secondary | ICD-10-CM | POA: Diagnosis not present

## 2021-05-24 DIAGNOSIS — R911 Solitary pulmonary nodule: Secondary | ICD-10-CM | POA: Insufficient documentation

## 2021-05-28 ENCOUNTER — Encounter: Payer: Self-pay | Admitting: Family Medicine

## 2021-05-28 DIAGNOSIS — Z23 Encounter for immunization: Secondary | ICD-10-CM | POA: Diagnosis not present

## 2021-06-04 DIAGNOSIS — Z85828 Personal history of other malignant neoplasm of skin: Secondary | ICD-10-CM | POA: Diagnosis not present

## 2021-06-04 DIAGNOSIS — L821 Other seborrheic keratosis: Secondary | ICD-10-CM | POA: Diagnosis not present

## 2021-06-06 ENCOUNTER — Other Ambulatory Visit: Payer: Self-pay | Admitting: Internal Medicine

## 2021-06-07 ENCOUNTER — Other Ambulatory Visit: Payer: Self-pay | Admitting: Family Medicine

## 2021-06-07 DIAGNOSIS — U071 COVID-19: Secondary | ICD-10-CM | POA: Diagnosis not present

## 2021-06-25 ENCOUNTER — Telehealth (INDEPENDENT_AMBULATORY_CARE_PROVIDER_SITE_OTHER): Payer: Medicare Other | Admitting: Family

## 2021-06-25 VITALS — BP 129/70 | HR 81

## 2021-06-25 DIAGNOSIS — J069 Acute upper respiratory infection, unspecified: Secondary | ICD-10-CM | POA: Diagnosis not present

## 2021-06-25 MED ORDER — BENZONATATE 100 MG PO CAPS: 100.0000 mg | ORAL_CAPSULE | Freq: Three times a day (TID) | ORAL | 0 refills | Status: DC | PRN

## 2021-06-25 NOTE — Progress Notes (Signed)
Virtual telephone visit    Virtual Visit via Telephone Note   This visit type was conducted due to national recommendations for restrictions regarding the COVID-19 Pandemic (e.g. social distancing) in an effort to limit this patient's exposure and mitigate transmission in our community. Due to her co-morbid illnesses, this patient is at least at moderate risk for complications without adequate follow up. This format is felt to be most appropriate for this patient at this time. The patient did not have access to video technology or had technical difficulties with video requiring transitioning to audio format only (telephone). Physical exam was limited to content and character of the telephone converstion. CMA was able to get the patient set up on a telephone visit.   Patient location: Home. Patient and provider in visit Provider location: Office  I discussed the limitations of evaluation and management by telemedicine and the availability of in person appointments. The patient expressed understanding and agreed to proceed.   Visit Date: 06/25/2021  Today's healthcare provider: Nance Pear, NP     Subjective:    Patient ID: Laura [name], female    DOB: 24-Dec-1940, 80 y.o.   MRN: [id number]  Chief Complaint  Patient presents with   Cough    Complains of persistent dry cough. Tested negative for covid 06/24/21.     HPI  Reports a deep cough (dry), mainly at night when she is laying down. Feels OK during the day.  Reports that cough started on Saturday night.  She drove back from [place].  Husband had been coughing a few days before.  She took a covid test which was negative. Denies body aches or fever.    Past Medical History:  Diagnosis Date   Adenomatous colon polyp 03/2012   Cancer ([place])    basal on back and nose   Cataract    Chicken pox as a child   CKD (chronic kidney disease), stage III ([place]) 02/21/2017   DDD (degenerative disc disease), cervical     EBV infection    mono as child   Fibrocystic breast 05/01/2014   GERD (gastroesophageal reflux disease)    pt denies   Hyperlipidemia    Hypertension 20 yrs ago   Measles as a child   Medicare annual wellness visit, subsequent 02/01/2015   Mumps as a child   Neck pain 01/22/2016   Other and unspecified hyperlipidemia 10/13/2013   Overweight(278.02)    PONV (postoperative nausea and vomiting)    pt would like something to prevent nausea   Vasovagal reaction 07/15/2015   no syncope but close     Past Surgical History:  Procedure Laterality Date   ABDOMINAL HYSTERECTOMY  29 yrs ago   heavy bleeding, ovaries left in place   ANTERIOR CERVICAL DECOMP/DISCECTOMY FUSION N/A 05/15/2017   Procedure: ANTERIOR CERVICAL DECOMPRESSION/DISCECTOMY FUSION CERVICL THREE CERVICAL FOUR, CERVICAL FOUR- CERVICAL FIVE;  Surgeon: Ashok Pall, MD;  Location: [facility];  Service: Neurosurgery;  Laterality: N/A;   CATARACT EXTRACTION, BILATERAL Bilateral 2012   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  Exercise 4:30 min (4.6 METS)-she reached peak heart rate 146 bpm which is 96% max protected.  No EKG changes.  Normal wall motion. No evidence of ischemia or infarction.    POLYPECTOMY     TRANSTHORACIC ECHOCARDIOGRAM  01/2017    EF 60  to 65%.  GR 1 DD.  Normal wall motion.  Normal atrial sizes.  Mildly dilated IVC.   VAGINAL HYSTERECTOMY  80 yrs old   uterus removed    Family History  Problem Relation Age of Onset   Heart disease Father    Heart attack Father 7   Hypertension Father    Dementia Maternal Grandmother    Heart disease Maternal Grandfather    Hyperlipidemia Paternal Grandmother    Cancer Paternal Grandfather        stomach   Heart attack Paternal Grandfather    Stomach cancer Paternal Grandfather    Dementia Mother    Pulmonary embolism Son    Vasculitis Son    Other Son         vasculitis led to PE   Colon cancer Neg Hx    Colon polyps Neg Hx    Rectal cancer Neg Hx     Social History   Socioeconomic History   Marital status: Married    Spouse name: Not on file   Number of children: Not on file   Years of education: Not on file   Highest education level: Not on file  Occupational History   Not on file  Tobacco Use   Smoking status: Former    Packs/day: 1.00    Years: 16.00    Pack years: 16.00    Types: Cigarettes    Start date: 07/29/1972   Smokeless tobacco: Never  Substance and Sexual Activity   Alcohol use: No    Alcohol/week: 0.0 standard drinks   Drug use: No   Sexual activity: Never    Comment: lives with husband no major dietary restrictions  Other Topics Concern   Not on file  Social History Narrative   Not on file   Social Determinants of Health   Financial Resource Strain: Not on file  Food Insecurity: Not on file  Transportation Needs: Not on file  Physical Activity: Not on file  Stress: Not on file  Social Connections: Not on file  Intimate Partner Violence: Not on file    Outpatient Medications Prior to Visit  Medication Sig Dispense Refill   acetaminophen (TYLENOL) 500 MG tablet Take 500 mg by mouth 2 (two) times daily as needed for mild pain.      hydrochlorothiazide (HYDRODIURIL) 12.5 MG tablet TAKE 1 TABLET BY MOUTH EVERY MORNING 90 tablet 1   losartan (COZAAR) 25 MG tablet TAKE 1 TABLET (25 MG TOTAL) BY MOUTH DAILY. 90 tablet 1   Multiple Vitamin (MULTIVITAMIN) tablet Take 1 tablet by mouth daily.     naproxen (NAPROSYN) 500 MG tablet Take 500 mg by mouth daily as needed.   1   rosuvastatin (CRESTOR) 20 MG tablet Take 1 tablet (20 mg total) by mouth daily. NEED OV. 90 tablet 0   No facility-administered medications prior to visit.    Allergies  Allergen Reactions   Lisinopril Other (See Comments)    dizzy   Statins Other (See Comments)    Muscle weakness - atorvastatin     Sulfa Antibiotics Other (See  Comments)    "feels feverish"   Tetracyclines & Related Other (See Comments)    GI upset   Benadryl [Diphenhydramine] Other (See Comments)    Urinary incontinence stopped when med discontinued    ROS     Objective:    Physical Exam  There were no vitals taken for this visit. Wt Readings from Last 3 Encounters:  11/21/20 153 lb 9.6 oz (  69.7 kg)  05/22/20 150 lb 12.8 oz (68.4 kg)  02/07/20 153 lb (69.4 kg)        Assessment & Plan:   Problem List Items Addressed This Visit       Unprioritized   Viral URI with cough - Primary    Seems to be improving overall. Will plan to rx with tessalon prn. Pt is advised to call if new/worsening symptoms or if symptoms are not improved in 3-4 days.        I am having Zacaria A. Musgrave start on benzonatate. I am also having her maintain her naproxen, acetaminophen, multivitamin, hydrochlorothiazide, rosuvastatin, and losartan.  Meds ordered this encounter  Medications   benzonatate (TESSALON) 100 MG capsule    Sig: Take 1 capsule (100 mg total) by mouth 3 (three) times daily as needed.    Dispense:  20 capsule    Refill:  0    Order Specific Question:   Supervising Provider    Answer:   Penni Homans A [[id number]]     I discussed the assessment and treatment plan with the patient. The patient was provided an opportunity to ask questions and all were answered. The patient agreed with the plan and demonstrated an understanding of the instructions.   The patient was advised to call back or seek an in-person evaluation if the symptoms worsen or if the condition fails to improve as anticipated.  I provided 11 minutes of non-face-to-face time during this encounter.   Nance Pear, NP [employer] [facility] [employer] [facility] [phone] (phone) [phone] (fax)  [place]

## 2021-06-25 NOTE — Assessment & Plan Note (Signed)
Seems to be improving overall. Will plan to rx with tessalon prn. Pt is advised to call if new/worsening symptoms or if symptoms are not improved in 3-4 days.

## 2021-06-29 ENCOUNTER — Other Ambulatory Visit: Payer: Self-pay

## 2021-06-29 ENCOUNTER — Ambulatory Visit (INDEPENDENT_AMBULATORY_CARE_PROVIDER_SITE_OTHER): Payer: Medicare Other | Admitting: Family

## 2021-06-29 ENCOUNTER — Ambulatory Visit (HOSPITAL_BASED_OUTPATIENT_CLINIC_OR_DEPARTMENT_OTHER)
Admission: RE | Admit: 2021-06-29 | Discharge: 2021-06-29 | Disposition: A | Discharge status: Home / Self Care | Payer: Medicare Other | Source: Ambulatory Visit | Attending: Family | Admitting: Family

## 2021-06-29 ENCOUNTER — Telehealth: Payer: Self-pay | Admitting: Family Medicine

## 2021-06-29 VITALS — BP 122/70 | HR 68 | Resp 18 | Ht 64.0 in | Wt 154.0 lb

## 2021-06-29 DIAGNOSIS — J40 Bronchitis, not specified as acute or chronic: Secondary | ICD-10-CM

## 2021-06-29 DIAGNOSIS — J984 Other disorders of lung: Secondary | ICD-10-CM | POA: Diagnosis not present

## 2021-06-29 MED ORDER — CEFDINIR 300 MG PO CAPS: 300.0000 mg | ORAL_CAPSULE | Freq: Two times a day (BID) | ORAL | 0 refills | Status: DC

## 2021-06-29 NOTE — Telephone Encounter (Signed)
Let's see if she can come in this afternoon to be seen at 3 pm please.

## 2021-06-29 NOTE — Progress Notes (Signed)
Subjective:   By signing my name below, I, Zite Okoli, attest that this documentation has been prepared under the direction and in the presence of Debbrah Alar, NP  06/29/2021    Patient ID: Laura [name], female    DOB: [birth year], 80 y.o.   MRN: [id number]  Chief Complaint  Patient presents with   Follow-up    Viral URI with cough- seen via VV on 06/25/21, RX tessalon prn on virtual visit but pt never picked this up, used OTC tx instead because she did not want her throat to be numb.     HPI Patient is in today for an office visit.  Cough-  She was seen on 11/28 for having a dry cough. We treated her symptoms for presumed virual uri. She reports that the cough is now "wet" and has some rhinorrhea. She denies ear pain and sinus pressure. Mentions there might have been some "crackling". She was prescribed tessalon [place] but did not pick it up because she had heard it numbs the throat. She has also been feeling very crappy and is not sleeping properly.  Past Medical History:  Diagnosis Date   Adenomatous colon polyp 03/2012   Cancer ([place])    basal on back and nose   Cataract    Chicken pox as a child   CKD (chronic kidney disease), stage III ([place]) 02/21/2017   DDD (degenerative disc disease), cervical    EBV infection    mono as child   Fibrocystic breast 05/01/2014   GERD (gastroesophageal reflux disease)    pt denies   Hyperlipidemia    Hypertension 20 yrs ago   Measles as a child   Medicare annual wellness visit, subsequent 02/01/2015   Mumps as a child   Neck pain 01/22/2016   Other and unspecified hyperlipidemia 10/13/2013   Overweight(278.02)    PONV (postoperative nausea and vomiting)    pt would like something to prevent nausea   Vasovagal reaction 07/15/2015   no syncope but close     Past Surgical History:  Procedure Laterality Date   ABDOMINAL HYSTERECTOMY  29 yrs ago   heavy bleeding, ovaries left in place   ANTERIOR CERVICAL DECOMP/DISCECTOMY  FUSION N/A 05/15/2017   Procedure: ANTERIOR CERVICAL DECOMPRESSION/DISCECTOMY FUSION CERVICL THREE CERVICAL FOUR, CERVICAL FOUR- CERVICAL FIVE;  Surgeon: Ashok Pall, MD;  Location: [facility];  Service: Neurosurgery;  Laterality: N/A;   CATARACT EXTRACTION, BILATERAL Bilateral 2012   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  Exercise 4:30 min (4.6 METS)-she reached peak heart rate 146 bpm which is 96% max protected.  No EKG changes.  Normal wall motion. No evidence of ischemia or infarction.    POLYPECTOMY     TRANSTHORACIC ECHOCARDIOGRAM  01/2017    EF 60 to 65%.  GR 1 DD.  Normal wall motion.  Normal atrial sizes.  Mildly dilated IVC.   VAGINAL HYSTERECTOMY  80 yrs old   uterus removed    Family History  Problem Relation Age of Onset   Heart disease Father    Heart attack Father 78   Hypertension Father    Dementia Maternal Grandmother    Heart disease Maternal Grandfather    Hyperlipidemia Paternal Grandmother    Cancer Paternal Grandfather        stomach   Heart attack Paternal Grandfather  Stomach cancer Paternal Grandfather    Dementia Mother    Pulmonary embolism Son    Vasculitis Son    Other Son        vasculitis led to PE   Colon cancer Neg Hx    Colon polyps Neg Hx    Rectal cancer Neg Hx     Social History   Socioeconomic History   Marital status: Married    Spouse name: Not on file   Number of children: Not on file   Years of education: Not on file   Highest education level: Not on file  Occupational History   Not on file  Tobacco Use   Smoking status: Former    Packs/day: 1.00    Years: 16.00    Pack years: 16.00    Types: Cigarettes    Start date: 07/29/1972   Smokeless tobacco: Never  Substance and Sexual Activity   Alcohol use: No    Alcohol/week: 0.0 standard drinks   Drug use: No   Sexual activity: Never    Comment: lives with husband no  major dietary restrictions  Other Topics Concern   Not on file  Social History Narrative   Not on file   Social Determinants of Health   Financial Resource Strain: Not on file  Food Insecurity: Not on file  Transportation Needs: Not on file  Physical Activity: Not on file  Stress: Not on file  Social Connections: Not on file  Intimate Partner Violence: Not on file    Outpatient Medications Prior to Visit  Medication Sig Dispense Refill   acetaminophen (TYLENOL) 500 MG tablet Take 500 mg by mouth 2 (two) times daily as needed for mild pain.      hydrochlorothiazide (HYDRODIURIL) 12.5 MG tablet TAKE 1 TABLET BY MOUTH EVERY MORNING 90 tablet 1   losartan (COZAAR) 25 MG tablet TAKE 1 TABLET (25 MG TOTAL) BY MOUTH DAILY. 90 tablet 1   Multiple Vitamin (MULTIVITAMIN) tablet Take 1 tablet by mouth daily.     naproxen (NAPROSYN) 500 MG tablet Take 500 mg by mouth daily as needed.   1   rosuvastatin (CRESTOR) 20 MG tablet Take 1 tablet (20 mg total) by mouth daily. NEED OV. 90 tablet 0   benzonatate (TESSALON) 100 MG capsule Take 1 capsule (100 mg total) by mouth 3 (three) times daily as needed. (Patient not taking: Reported on 06/29/2021) 20 capsule 0   No facility-administered medications prior to visit.    Allergies  Allergen Reactions   Lisinopril Other (See Comments)    dizzy   Statins Other (See Comments)    Muscle weakness - atorvastatin     Sulfa Antibiotics Other (See Comments)    "feels feverish"   Tetracyclines & Related Other (See Comments)    GI upset   Benadryl [Diphenhydramine] Other (See Comments)    Urinary incontinence stopped when med discontinued    Review of Systems  Constitutional:  Positive for malaise/fatigue. Negative for fever.  HENT:  Negative for ear pain and hearing loss.        (+) rhinorrhea   Eyes:  Negative for blurred vision.  Respiratory:  Positive for cough. Negative for shortness of breath and wheezing.   Cardiovascular:  Negative for  chest pain and leg swelling.  Gastrointestinal:  Negative for blood in stool, diarrhea, nausea and vomiting.  Genitourinary:  Negative for dysuria and frequency.  Musculoskeletal:  Negative for joint pain and myalgias.  Skin:  Negative for rash.  Neurological:  Negative for headaches.  Psychiatric/Behavioral:  Negative for depression. The patient is not nervous/anxious.       Objective:    Physical Exam Constitutional:      General: She is not in acute distress.    Appearance: Normal appearance. She is not ill-appearing.  HENT:     Head: Normocephalic and atraumatic.     Right Ear: External ear normal.     Left Ear: External ear normal.  Eyes:     Extraocular Movements: Extraocular movements intact.     Pupils: Pupils are equal, round, and reactive to light.  Cardiovascular:     Rate and Rhythm: Normal rate and regular rhythm.     Pulses: Normal pulses.     Heart sounds: Normal heart sounds. No murmur heard. Pulmonary:     Effort: Pulmonary effort is normal. No respiratory distress.     Breath sounds: Normal breath sounds. No wheezing or rhonchi.  Abdominal:     General: Bowel sounds are normal. There is no distension.     Palpations: Abdomen is soft.     Tenderness: There is no abdominal tenderness. There is no guarding or rebound.  Musculoskeletal:     Cervical back: Neck supple.  Lymphadenopathy:     Cervical: No cervical adenopathy.  Skin:    General: Skin is warm and dry.  Neurological:     Mental Status: She is alert and oriented to person, place, and time.  Psychiatric:        Behavior: Behavior normal.        Judgment: Judgment normal.    BP 122/70 (BP Location: Left Arm, Patient Position: Sitting, Cuff Size: Normal)   Pulse 68   Resp 18   Ht 5\' 4"  (1.626 m)   Wt 154 lb (69.9 kg) Comment: pt reported  SpO2 99%   BMI 26.43 kg/m  Wt Readings from Last 3 Encounters:  06/29/21 154 lb (69.9 kg)  11/21/20 153 lb 9.6 oz (69.7 kg)  05/22/20 150 lb 12.8 oz  (68.4 kg)    Diabetic Foot Exam - Simple   No data filed    Lab Results  Component Value Date   WBC 4.9 05/14/2021   HGB 12.9 05/14/2021   HCT 38.6 05/14/2021   PLT 218.0 05/14/2021   GLUCOSE 82 05/14/2021   CHOL 165 05/14/2021   TRIG 150.0 (H) 05/14/2021   HDL 52.90 05/14/2021   LDLDIRECT 157.0 09/13/2019   LDLCALC 82 05/14/2021   ALT 18 05/14/2021   AST 21 05/14/2021   NA 140 05/14/2021   K 3.9 05/14/2021   CL 103 05/14/2021   CREATININE 0.89 05/14/2021   BUN 15 05/14/2021   CO2 30 05/14/2021   TSH 3.40 05/14/2021   HGBA1C 6.1 05/10/2021    Lab Results  Component Value Date   TSH 3.40 05/14/2021   Lab Results  Component Value Date   WBC 4.9 05/14/2021   HGB 12.9 05/14/2021   HCT 38.6 05/14/2021   MCV 88.6 05/14/2021   PLT 218.0 05/14/2021   Lab Results  Component Value Date   NA 140 05/14/2021   K 3.9 05/14/2021   CO2 30 05/14/2021   GLUCOSE 82 05/14/2021   BUN 15 05/14/2021   CREATININE 0.89 05/14/2021   BILITOT 0.4 05/14/2021   ALKPHOS 41 05/14/2021   AST 21 05/14/2021   ALT 18 05/14/2021   PROT 6.5 05/14/2021   ALBUMIN 4.2 05/14/2021   CALCIUM 9.7 05/14/2021   ANIONGAP 10 05/09/2017   GFR 61.85  05/14/2021   Lab Results  Component Value Date   CHOL 165 05/14/2021   Lab Results  Component Value Date   HDL 52.90 05/14/2021   Lab Results  Component Value Date   LDLCALC 82 05/14/2021   Lab Results  Component Value Date   TRIG 150.0 (H) 05/14/2021   Lab Results  Component Value Date   CHOLHDL 3 05/14/2021   Lab Results  Component Value Date   HGBA1C 6.1 05/10/2021       Assessment & Plan:   Problem List Items Addressed This Visit       Unprioritized   Bronchitis - Primary    New. Pt is advised as follows:  Please begin cefdinir twice daily for bronchitis. You may use tessalon as needed for cough or an otc cough syrup such as delsym if you prefer. Complete chest x-ray on the first floor. Call if you develop fever, if  symptoms worsen, or if symptoms do not improve.       Relevant Orders   DG Chest 2 View      Meds ordered this encounter  Medications   cefdinir (OMNICEF) 300 MG capsule    Sig: Take 1 capsule (300 mg total) by mouth 2 (two) times daily.    Dispense:  14 capsule    Refill:  0    Order Specific Question:   Supervising Provider    Answer:   Penni Homans A [[id number]]    I,Zite Okoli,acting as a scribe for Nance Pear, NP.,have documented all relevant documentation on the behalf of Nance Pear, NP,as directed by  Nance Pear, NP while in the presence of Nance Pear, NP.   I, Debbrah Alar, NP, personally preformed the services described in this documentation.  All medical record entries made by the scribe were at my direction and in my presence.  I have reviewed the chart and discharge instructions (if applicable) and agree that the record reflects my personal performance and is accurate and complete. 06/29/2021

## 2021-06-29 NOTE — Telephone Encounter (Signed)
Pt. Called and stated she was informed to call back by Friday if she wasn't feeling better.Pt states she is not feeling better and wants  to see her next steps.

## 2021-06-29 NOTE — Telephone Encounter (Signed)
Pt will be coming in.

## 2021-06-29 NOTE — Telephone Encounter (Signed)
Pt had a video visit on 11/28 this was the plan- please advise:  Viral URI with cough - Primary       Seems to be improving overall. Will plan to rx with tessalon prn. Pt is advised to call if new/worsening symptoms or if symptoms are not improved in 3-4 days.

## 2021-06-29 NOTE — Patient Instructions (Signed)
Please begin cefdinir twice daily for bronchitis. You may use tessalon as needed for cough or an otc cough syrup such as delsym if you prefer. Complete chest x-ray on the first floor. Call if you develop fever, if symptoms worsen, or if symptoms do not improve.

## 2021-06-29 NOTE — Assessment & Plan Note (Signed)
New. Pt is advised as follows:  Please begin cefdinir twice daily for bronchitis. You may use tessalon as needed for cough or an otc cough syrup such as delsym if you prefer. Complete chest x-ray on the first floor. Call if you develop fever, if symptoms worsen, or if symptoms do not improve.

## 2021-07-04 ENCOUNTER — Telehealth: Payer: Self-pay | Admitting: Family Medicine

## 2021-07-04 ENCOUNTER — Other Ambulatory Visit: Payer: Self-pay

## 2021-07-04 DIAGNOSIS — Z1231 Encounter for screening mammogram for malignant neoplasm of breast: Secondary | ICD-10-CM

## 2021-07-04 NOTE — Telephone Encounter (Signed)
Mammogram placed.

## 2021-07-04 NOTE — Telephone Encounter (Signed)
Patient stated it is time for her mammogram but when she called to schedule one they told her she did not have orders in for one. She would like for a mammogram to be ordered. Please advice.

## 2021-07-09 ENCOUNTER — Encounter: Payer: Self-pay | Admitting: Family

## 2021-07-10 ENCOUNTER — Encounter: Payer: Self-pay | Admitting: Family Medicine

## 2021-07-10 ENCOUNTER — Telehealth (INDEPENDENT_AMBULATORY_CARE_PROVIDER_SITE_OTHER): Payer: Medicare Other | Admitting: Family

## 2021-07-10 DIAGNOSIS — R197 Diarrhea, unspecified: Secondary | ICD-10-CM

## 2021-07-10 NOTE — Assessment & Plan Note (Signed)
New. Need to rule out C diff. Husband will come pick up specimen container today and will return tomorrow am. In the meantime, advised her to hydrate well. Add probiotic/yogurt for gut health.

## 2021-07-10 NOTE — Patient Instructions (Signed)
Please pick up a stool kit from the office today.  Increase hydration, yogurt intake and begin probiotics.  Continue to monitor symptoms and schedule a follow up visit if they worsen or do not improve.

## 2021-07-10 NOTE — Progress Notes (Signed)
Virtual telephone visit    Virtual Visit via Telephone Note   This visit type was conducted due to national recommendations for restrictions regarding the COVID-19 Pandemic (e.g. social distancing) in an effort to limit this patient's exposure and mitigate transmission in our community. Due to her co-morbid illnesses, this patient is at least at moderate risk for complications without adequate follow up. This format is felt to be most appropriate for this patient at this time. The patient did not have access to video technology or had technical difficulties with video requiring transitioning to audio format only (telephone). Physical exam was limited to content and character of the telephone converstion. Rod Holler was able to get the patient set up on a telephone visit.   Patient location: Home Patient and provider in visit Provider location: Office  I discussed the limitations of evaluation and management by telemedicine and the availability of in person appointments. The patient expressed understanding and agreed to proceed.   Visit Date: 07/10/2021  Today's healthcare provider: Nance Pear, NP     Subjective:    Patient ID: Laura [name], female    DOB: [birth year], 80 y.o.   MRN: [id number]  Chief Complaint  Patient presents with   Diarrhea    Complains of diarrhea worse in am. Still soft stool during the rest of the day. This started 07-07-21 after taking Cefdinir x 7 days last pill 07-06-21.     Diarrhea   Patient is in today for a telephone visit.  Diarrhea: She was last seen in the office on 06/29/2021 for bronchitis symptoms. She was treated with 300 mg cefdinir. She finished the course of of the 300 mg cefdinir without any adverse reactions. She began experiencing diarrhea a couple of days ago. She notes that when she wakes up in the morning she urinates and has loose stool at the same time. Then throughout the day she has formed but very soft stools. She notes  that she has collected a stool sample at this time and is willing to have a stool test performed.   Past Medical History:  Diagnosis Date   Adenomatous colon polyp 03/2012   Cancer ([place])    basal on back and nose   Cataract    Chicken pox as a child   CKD (chronic kidney disease), stage III ([place]) 02/21/2017   DDD (degenerative disc disease), cervical    EBV infection    mono as child   Fibrocystic breast 05/01/2014   GERD (gastroesophageal reflux disease)    pt denies   Hyperlipidemia    Hypertension 20 yrs ago   Measles as a child   Medicare annual wellness visit, subsequent 02/01/2015   Mumps as a child   Neck pain 01/22/2016   Other and unspecified hyperlipidemia 10/13/2013   Overweight(278.02)    PONV (postoperative nausea and vomiting)    pt would like something to prevent nausea   Vasovagal reaction 07/15/2015   no syncope but close     Past Surgical History:  Procedure Laterality Date   ABDOMINAL HYSTERECTOMY  29 yrs ago   heavy bleeding, ovaries left in place   ANTERIOR CERVICAL DECOMP/DISCECTOMY FUSION N/A 05/15/2017   Procedure: ANTERIOR CERVICAL DECOMPRESSION/DISCECTOMY FUSION CERVICL THREE CERVICAL FOUR, CERVICAL FOUR- CERVICAL FIVE;  Surgeon: Ashok Pall, MD;  Location: [facility];  Service: Neurosurgery;  Laterality: N/A;   CATARACT EXTRACTION, BILATERAL Bilateral 2012   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  Exercise 4:30 min (4.6 METS)-she reached peak heart rate 146 bpm which is 96% max protected.  No EKG changes.  Normal wall motion. No evidence of ischemia or infarction.    POLYPECTOMY     TRANSTHORACIC ECHOCARDIOGRAM  01/2017    EF 60 to 65%.  GR 1 DD.  Normal wall motion.  Normal atrial sizes.  Mildly dilated IVC.   VAGINAL HYSTERECTOMY  80 yrs old   uterus removed    Family History  Problem Relation Age of Onset   Heart disease Father    Heart attack  Father 80   Hypertension Father    Dementia Maternal Grandmother    Heart disease Maternal Grandfather    Hyperlipidemia Paternal Grandmother    Cancer Paternal Grandfather        stomach   Heart attack Paternal Grandfather    Stomach cancer Paternal Grandfather    Dementia Mother    Pulmonary embolism Son    Vasculitis Son    Other Son        vasculitis led to PE   Colon cancer Neg Hx    Colon polyps Neg Hx    Rectal cancer Neg Hx     Social History   Socioeconomic History   Marital status: Married    Spouse name: Not on file   Number of children: Not on file   Years of education: Not on file   Highest education level: Not on file  Occupational History   Not on file  Tobacco Use   Smoking status: Former    Packs/day: 1.00    Years: 16.00    Pack years: 16.00    Types: Cigarettes    Start date: 07/29/1972   Smokeless tobacco: Never  Substance and Sexual Activity   Alcohol use: No    Alcohol/week: 0.0 standard drinks   Drug use: No   Sexual activity: Never    Comment: lives with husband no major dietary restrictions  Other Topics Concern   Not on file  Social History Narrative   Not on file   Social Determinants of Health   Financial Resource Strain: Not on file  Food Insecurity: Not on file  Transportation Needs: Not on file  Physical Activity: Not on file  Stress: Not on file  Social Connections: Not on file  Intimate Partner Violence: Not on file    Outpatient Medications Prior to Visit  Medication Sig Dispense Refill   acetaminophen (TYLENOL) 500 MG tablet Take 500 mg by mouth 2 (two) times daily as needed for mild pain.      hydrochlorothiazide (HYDRODIURIL) 12.5 MG tablet TAKE 1 TABLET BY MOUTH EVERY MORNING 90 tablet 1   losartan (COZAAR) 25 MG tablet TAKE 1 TABLET (25 MG TOTAL) BY MOUTH DAILY. 90 tablet 1   Multiple Vitamin (MULTIVITAMIN) tablet Take 1 tablet by mouth daily.     naproxen (NAPROSYN) 500 MG tablet Take 500 mg by mouth daily as  needed.   1   rosuvastatin (CRESTOR) 20 MG tablet Take 1 tablet (20 mg total) by mouth daily. NEED OV. 90 tablet 0   cefdinir (OMNICEF) 300 MG capsule Take 1 capsule (300 mg total) by mouth 2 (two) times daily. (Patient not taking: Reported on 07/10/2021) 14 capsule 0   No facility-administered medications prior to visit.    Allergies  Allergen Reactions   Lisinopril Other (See Comments)  dizzy   Statins Other (See Comments)    Muscle weakness - atorvastatin     Sulfa Antibiotics Other (See Comments)    "feels feverish"   Tetracyclines & Related Other (See Comments)    GI upset   Benadryl [Diphenhydramine] Other (See Comments)    Urinary incontinence stopped when med discontinued    Review of Systems  Gastrointestinal:  Positive for diarrhea.      Objective:    Physical Exam Constitutional:      General: She is not in acute distress. Neurological:     Mental Status: She is oriented to person, place, and time.  Psychiatric:        Behavior: Behavior normal.        Judgment: Judgment normal.    There were no vitals taken for this visit. Wt Readings from Last 3 Encounters:  06/29/21 154 lb (69.9 kg)  11/21/20 153 lb 9.6 oz (69.7 kg)  05/22/20 150 lb 12.8 oz (68.4 kg)    Diabetic Foot Exam - Simple   No data filed    Lab Results  Component Value Date   WBC 4.9 05/14/2021   HGB 12.9 05/14/2021   HCT 38.6 05/14/2021   PLT 218.0 05/14/2021   GLUCOSE 82 05/14/2021   CHOL 165 05/14/2021   TRIG 150.0 (H) 05/14/2021   HDL 52.90 05/14/2021   LDLDIRECT 157.0 09/13/2019   LDLCALC 82 05/14/2021   ALT 18 05/14/2021   AST 21 05/14/2021   NA 140 05/14/2021   K 3.9 05/14/2021   CL 103 05/14/2021   CREATININE 0.89 05/14/2021   BUN 15 05/14/2021   CO2 30 05/14/2021   TSH 3.40 05/14/2021   HGBA1C 6.1 05/10/2021    Lab Results  Component Value Date   TSH 3.40 05/14/2021   Lab Results  Component Value Date   WBC 4.9 05/14/2021   HGB 12.9 05/14/2021   HCT  38.6 05/14/2021   MCV 88.6 05/14/2021   PLT 218.0 05/14/2021   Lab Results  Component Value Date   NA 140 05/14/2021   K 3.9 05/14/2021   CO2 30 05/14/2021   GLUCOSE 82 05/14/2021   BUN 15 05/14/2021   CREATININE 0.89 05/14/2021   BILITOT 0.4 05/14/2021   ALKPHOS 41 05/14/2021   AST 21 05/14/2021   ALT 18 05/14/2021   PROT 6.5 05/14/2021   ALBUMIN 4.2 05/14/2021   CALCIUM 9.7 05/14/2021   ANIONGAP 10 05/09/2017   GFR 61.85 05/14/2021   Lab Results  Component Value Date   CHOL 165 05/14/2021   Lab Results  Component Value Date   HDL 52.90 05/14/2021   Lab Results  Component Value Date   LDLCALC 82 05/14/2021   Lab Results  Component Value Date   TRIG 150.0 (H) 05/14/2021   Lab Results  Component Value Date   CHOLHDL 3 05/14/2021   Lab Results  Component Value Date   HGBA1C 6.1 05/10/2021       Assessment & Plan:   Problem List Items Addressed This Visit       Unprioritized   Diarrhea - Primary    New. Need to rule out C diff. Husband will come pick up specimen container today and will return tomorrow am. In the meantime, advised her to hydrate well. Add probiotic/yogurt for gut health.       Relevant Orders   Clostridium Difficile by PCR([facility])     No orders of the defined types were placed in this encounter.    I  discussed the assessment and treatment plan with the patient. The patient was provided an opportunity to ask questions and all were answered. The patient agreed with the plan and demonstrated an understanding of the instructions.   The patient was advised to call back or seek an in-person evaluation if the symptoms worsen or if the condition fails to improve as anticipated.  I,Lyric Barr-McArthur,acting as a Education administrator for [employer], NP.,have documented all relevant documentation on the behalf of Nance Pear, NP,as directed by  Nance Pear, NP while in the presence of Nance Pear, NP.  I  provided 11 minutes of non-face-to-face time during this encounter.   Nance Pear, NP [employer] [facility] [employer] [phone] (phone) [phone] (fax)  [place]

## 2021-07-11 ENCOUNTER — Other Ambulatory Visit: Payer: Medicare Other

## 2021-07-11 DIAGNOSIS — R197 Diarrhea, unspecified: Secondary | ICD-10-CM

## 2021-07-12 LAB — CLOSTRIDIUM DIFFICILE BY PCR: Toxigenic C. Difficile by PCR: NEGATIVE

## 2021-07-13 ENCOUNTER — Telehealth: Payer: Self-pay

## 2021-07-13 ENCOUNTER — Encounter: Payer: Self-pay | Admitting: Family

## 2021-07-13 NOTE — Telephone Encounter (Signed)
Per patient she received results and message from Laura [name].

## 2021-07-13 NOTE — Telephone Encounter (Signed)
Caller states need test results. [employer] [facility] state they are in. Requesting results.   Telephone: [phone]  Alt phone: [phone]

## 2021-07-17 ENCOUNTER — Ambulatory Visit: Payer: Medicare Other | Admitting: Family Medicine

## 2021-08-20 ENCOUNTER — Other Ambulatory Visit: Payer: Self-pay

## 2021-08-20 ENCOUNTER — Ambulatory Visit (HOSPITAL_BASED_OUTPATIENT_CLINIC_OR_DEPARTMENT_OTHER)
Admission: RE | Admit: 2021-08-20 | Discharge: 2021-08-20 | Disposition: A | Discharge status: Home / Self Care | Payer: Medicare Other | Source: Ambulatory Visit | Attending: Family Medicine | Admitting: Family Medicine

## 2021-08-20 ENCOUNTER — Encounter (HOSPITAL_BASED_OUTPATIENT_CLINIC_OR_DEPARTMENT_OTHER): Payer: Self-pay

## 2021-08-20 DIAGNOSIS — Z1231 Encounter for screening mammogram for malignant neoplasm of breast: Secondary | ICD-10-CM | POA: Insufficient documentation

## 2021-09-01 ENCOUNTER — Other Ambulatory Visit: Payer: Self-pay | Admitting: Internal Medicine

## 2021-09-02 ENCOUNTER — Other Ambulatory Visit: Payer: Self-pay | Admitting: Family Medicine

## 2021-09-03 ENCOUNTER — Other Ambulatory Visit: Payer: Self-pay | Admitting: Family Medicine

## 2021-09-03 ENCOUNTER — Telehealth: Payer: Self-pay | Admitting: Family Medicine

## 2021-09-03 MED ORDER — ROSUVASTATIN CALCIUM 20 MG PO TABS: 20.0000 mg | ORAL_TABLET | Freq: Every day | ORAL | 3 refills | Status: DC

## 2021-09-03 NOTE — Telephone Encounter (Signed)
Patient states she has never seen Dr. Debara Pickett, and only had a telephone call with him at the lipid clinic. When she tried to get a refill for her rosuvastatin they have her a prescription but advised her to get an OV in order to get more refills once she runs out. Patient states she would like Dr. Charlett Blake to manage that medication from now on. Please advise.

## 2021-11-07 NOTE — Progress Notes (Signed)
? ?Subjective:  ? ? Patient ID: Laura [name], female    DOB: 20-Jun-1941, 81 y.o.   MRN: [id number] ? ?Chief Complaint  ?Patient presents with  ? Follow-up  ? ? ?HPI ?Patient is in today for a follow up and overall she is doing well. No recent febrile illness or hospitalizations. No polyuria or polydipsia. Struggles with some constipation at times but warm prune juice usually does the trick uses an occasional stool softeners. Denies CP/palp/SOB/HA/congestion/fevers/GI or GU c/o. Taking meds as prescribed  ? ?Past Medical History:  ?Diagnosis Date  ? Adenomatous colon polyp 03/2012  ? Cancer [facility])   ? basal on back and nose  ? Cataract   ? Chicken pox as a child  ? CKD (chronic kidney disease), stage III ([place]) 02/21/2017  ? DDD (degenerative disc disease), cervical   ? EBV infection   ? mono as child  ? Fibrocystic breast 05/01/2014  ? GERD (gastroesophageal reflux disease)   ? pt denies  ? Hyperlipidemia   ? Hypertension 20 yrs ago  ? Measles as a child  ? Medicare annual wellness visit, subsequent 02/01/2015  ? Mumps as a child  ? Neck pain 01/22/2016  ? Other and unspecified hyperlipidemia 10/13/2013  ? Overweight(278.02)   ? PONV (postoperative nausea and vomiting)   ? pt would like something to prevent nausea  ? Vasovagal reaction 07/15/2015  ? no syncope but close   ? ? ?Past Surgical History:  ?Procedure Laterality Date  ? ABDOMINAL HYSTERECTOMY  30 yrs ago  ? heavy bleeding, ovaries left in place  ? ANTERIOR CERVICAL DECOMP/DISCECTOMY FUSION N/A 05/15/2017  ? Procedure: ANTERIOR CERVICAL DECOMPRESSION/DISCECTOMY FUSION CERVICL THREE CERVICAL FOUR, CERVICAL FOUR- CERVICAL FIVE;  Surgeon: Ashok Pall, MD;  Location: [place];  Service: Neurosurgery;  Laterality: N/A;  ? CATARACT EXTRACTION, BILATERAL Bilateral 2012  ? c/o blepharospasms since then  ? COLONOSCOPY    ? EYE SURGERY  11-27-2010  ? cataract surgery in both eyes  ? NM MYOVIEW LTD  04/2017  ? EF 65-70%.  Exercise 4:30 min (4.6 METS)-she reached peak heart  rate 146 bpm which is 96% max protected.  No EKG changes.  Normal wall motion. No evidence of ischemia or infarction.   ? POLYPECTOMY    ? TRANSTHORACIC ECHOCARDIOGRAM  01/2017  ?  EF 60 to 65%.  GR 1 DD.  Normal wall motion.  Normal atrial sizes.  Mildly dilated IVC.  ? VAGINAL HYSTERECTOMY  81 yrs old  ? uterus removed  ? ? ?Family History  ?Problem Relation Age of Onset  ? Heart disease Father   ? Heart attack Father 27  ? Hypertension Father   ? Dementia Maternal Grandmother   ? Heart disease Maternal Grandfather   ? Hyperlipidemia Paternal Grandmother   ? Cancer Paternal Grandfather   ?     stomach  ? Heart attack Paternal Grandfather   ? Stomach cancer Paternal Grandfather   ? Dementia Mother   ? Pulmonary embolism Son   ? Vasculitis Son   ? Other Son   ?     vasculitis led to PE  ? Colon cancer Neg Hx   ? Colon polyps Neg Hx   ? Rectal cancer Neg Hx   ? ? ?Social History  ? ?Socioeconomic History  ? Marital status: Married  ?  Spouse name: Not on file  ? Number of children: Not on file  ? Years of education: Not on file  ? Highest education level: Not on  file  ?Occupational History  ? Not on file  ?Tobacco Use  ? Smoking status: Former  ?  Packs/day: 1.00  ?  Years: 16.00  ?  Pack years: 16.00  ?  Types: Cigarettes  ?  Start date: 07/29/1972  ? Smokeless tobacco: Never  ?Substance and Sexual Activity  ? Alcohol use: No  ?  Alcohol/week: 0.0 standard drinks  ? Drug use: No  ? Sexual activity: Never  ?  Comment: lives with husband no major dietary restrictions  ?Other Topics Concern  ? Not on file  ?Social History Narrative  ? Not on file  ? ?Social Determinants of Health  ? ?Financial Resource Strain: Not on file  ?Food Insecurity: Not on file  ?Transportation Needs: Not on file  ?Physical Activity: Not on file  ?Stress: Not on file  ?Social Connections: Not on file  ?Intimate Partner Violence: Not on file  ? ? ?Outpatient Medications Prior to Visit  ?Medication Sig Dispense Refill  ? acetaminophen (TYLENOL) 500  MG tablet Take 500 mg by mouth 2 (two) times daily as needed for mild pain.     ? hydrochlorothiazide (HYDRODIURIL) 12.5 MG tablet TAKE 1 TABLET BY MOUTH EVERY DAY IN THE MORNING 90 tablet 1  ? losartan (COZAAR) 25 MG tablet TAKE 1 TABLET (25 MG TOTAL) BY MOUTH DAILY. 90 tablet 1  ? Multiple Vitamin (MULTIVITAMIN) tablet Take 1 tablet by mouth daily.    ? naproxen (NAPROSYN) 500 MG tablet Take 500 mg by mouth daily as needed.   1  ? rosuvastatin (CRESTOR) 20 MG tablet Take 1 tablet (20 mg total) by mouth daily. Patient Needs Office Visit for future refills 30 tablet 3  ? cefdinir (OMNICEF) 300 MG capsule Take 1 capsule (300 mg total) by mouth 2 (two) times daily. (Patient not taking: Reported on 07/10/2021) 14 capsule 0  ? ?No facility-administered medications prior to visit.  ? ? ?Allergies  ?Allergen Reactions  ? Omnicef [Cefdinir] Nausea And Vomiting  ? Lisinopril Other (See Comments)  ?  dizzy  ? Statins Other (See Comments)  ?  Muscle weakness - atorvastatin  ?  ? Sulfa Antibiotics Other (See Comments)  ?  "feels feverish"  ? Tetracyclines & Related Other (See Comments)  ?  GI upset  ? Benadryl [Diphenhydramine] Other (See Comments)  ?  Urinary incontinence stopped when med discontinued  ? ? ?Review of Systems  ?Constitutional:  Negative for fever and malaise/fatigue.  ?HENT:  Negative for congestion.   ?Eyes:  Negative for blurred vision.  ?Respiratory:  Negative for shortness of breath.   ?Cardiovascular:  Negative for chest pain, palpitations and leg swelling.  ?Gastrointestinal:  Negative for abdominal pain, blood in stool and nausea.  ?Genitourinary:  Negative for dysuria and frequency.  ?Musculoskeletal:  Positive for joint pain. Negative for falls.  ?Skin:  Negative for rash.  ?Neurological:  Positive for focal weakness. Negative for dizziness, loss of consciousness and headaches.  ?     Weakness and pain in left thumb, stiffness with arthritis   ?Endo/Heme/Allergies:  Negative for environmental  allergies.  ?Psychiatric/Behavioral:  Negative for depression. The patient is not nervous/anxious.   ? ?   ?Objective:  ?  ?Physical Exam ?Constitutional:   ?   General: She is not in acute distress. ?   Appearance: She is well-developed.  ?HENT:  ?   Head: Normocephalic and atraumatic.  ?Eyes:  ?   Conjunctiva/sclera: Conjunctivae normal.  ?Neck:  ?   Thyroid: No thyromegaly.  ?  Cardiovascular:  ?   Rate and Rhythm: Normal rate and regular rhythm.  ?   Heart sounds: Normal heart sounds. No murmur heard. ?Pulmonary:  ?   Effort: Pulmonary effort is normal. No respiratory distress.  ?   Breath sounds: Normal breath sounds.  ?Abdominal:  ?   General: Bowel sounds are normal. There is no distension.  ?   Palpations: Abdomen is soft. There is no mass.  ?   Tenderness: There is no abdominal tenderness.  ?Musculoskeletal:     ?   General: Deformity present.  ?   Cervical back: Neck supple.  ?   Comments: Right foot 2nd and 3rd toe flexed joint and bunion on right foot   ?Lymphadenopathy:  ?   Cervical: No cervical adenopathy.  ?Skin: ?   General: Skin is warm and dry.  ?Neurological:  ?   Mental Status: She is alert and oriented to person, place, and time.  ?Psychiatric:     ?   Behavior: Behavior normal.  ? ? ?BP 120/86 (BP Location: Right Arm, Patient Position: Sitting, Cuff Size: Normal)   Pulse 76   Resp 20   Ht '5\' 4"'$  (1.626 m)   Wt 157 lb (71.2 kg)   SpO2 99%   BMI 26.95 kg/m?  ?Wt Readings from Last 3 Encounters:  ?11/08/21 157 lb (71.2 kg)  ?06/29/21 154 lb (69.9 kg)  ?11/21/20 153 lb 9.6 oz (69.7 kg)  ? ? ?Diabetic Foot Exam - Simple   ?No data filed ?  ? ?Lab Results  ?Component Value Date  ? WBC 4.9 05/14/2021  ? HGB 12.9 05/14/2021  ? HCT 38.6 05/14/2021  ? PLT 218.0 05/14/2021  ? GLUCOSE 82 05/14/2021  ? CHOL 165 05/14/2021  ? TRIG 150.0 (H) 05/14/2021  ? HDL 52.90 05/14/2021  ? LDLDIRECT 157.0 09/13/2019  ? [place] 82 05/14/2021  ? ALT 18 05/14/2021  ? AST 21 05/14/2021  ? NA 140 05/14/2021  ? K 3.9  05/14/2021  ? CL 103 05/14/2021  ? CREATININE 0.89 05/14/2021  ? BUN 15 05/14/2021  ? CO2 30 05/14/2021  ? TSH 3.40 05/14/2021  ? HGBA1C 6.1 05/10/2021  ? ? ?Lab Results  ?Component Value Date  ? TSH 3.40 05/14/2021  ?

## 2021-11-08 ENCOUNTER — Encounter: Payer: Self-pay | Admitting: Family Medicine

## 2021-11-08 ENCOUNTER — Ambulatory Visit (INDEPENDENT_AMBULATORY_CARE_PROVIDER_SITE_OTHER): Payer: Medicare Other | Admitting: Family Medicine

## 2021-11-08 VITALS — BP 120/86 | HR 76 | Resp 20 | Ht 64.0 in | Wt 157.0 lb

## 2021-11-08 DIAGNOSIS — M79645 Pain in left finger(s): Secondary | ICD-10-CM | POA: Diagnosis not present

## 2021-11-08 DIAGNOSIS — M79671 Pain in right foot: Secondary | ICD-10-CM

## 2021-11-08 DIAGNOSIS — M19042 Primary osteoarthritis, left hand: Secondary | ICD-10-CM | POA: Diagnosis not present

## 2021-11-08 DIAGNOSIS — E782 Mixed hyperlipidemia: Secondary | ICD-10-CM | POA: Diagnosis not present

## 2021-11-08 DIAGNOSIS — N183 Chronic kidney disease, stage 3 unspecified: Secondary | ICD-10-CM

## 2021-11-08 DIAGNOSIS — R739 Hyperglycemia, unspecified: Secondary | ICD-10-CM | POA: Diagnosis not present

## 2021-11-08 DIAGNOSIS — M720 Palmar fascial fibromatosis [Dupuytren]: Secondary | ICD-10-CM | POA: Diagnosis not present

## 2021-11-08 DIAGNOSIS — I10 Essential (primary) hypertension: Secondary | ICD-10-CM

## 2021-11-08 DIAGNOSIS — K5900 Constipation, unspecified: Secondary | ICD-10-CM

## 2021-11-08 LAB — CBC
HCT: 39.3 % (ref 36.0–46.0)
Hemoglobin: 13.4 g/dL (ref 12.0–15.0)
MCHC: 34 g/dL (ref 30.0–36.0)
MCV: 88.6 fl (ref 78.0–100.0)
Platelets: 193 10*3/uL (ref 150.0–400.0)
RBC: 4.44 Mil/uL (ref 3.87–5.11)
RDW: 14.2 % (ref 11.5–15.5)
WBC: 4.7 10*3/uL (ref 4.0–10.5)

## 2021-11-08 LAB — COMPREHENSIVE METABOLIC PANEL
ALT: 13 U/L (ref 0–35)
AST: 17 U/L (ref 0–37)
Albumin: 4.2 g/dL (ref 3.5–5.2)
Alkaline Phosphatase: 43 U/L (ref 39–117)
BUN: 19 mg/dL (ref 6–23)
CO2: 29 mEq/L (ref 19–32)
Calcium: 9.3 mg/dL (ref 8.4–10.5)
Chloride: 103 mEq/L (ref 96–112)
Creatinine, Ser: 0.82 mg/dL (ref 0.40–1.20)
GFR: 68 mL/min (ref >60.00)
Glucose, Bld: 86 mg/dL (ref 70–99)
Potassium: 4 mEq/L (ref 3.5–5.1)
Sodium: 139 mEq/L (ref 135–145)
Total Bilirubin: 0.4 mg/dL (ref 0.2–1.2)
Total Protein: 6.4 g/dL (ref 6.0–8.3)

## 2021-11-08 LAB — LIPID PANEL
Cholesterol: 168 mg/dL (ref 0–200)
HDL: 57.4 mg/dL (ref >39.00)
LDL Cholesterol: 90 mg/dL (ref 0–99)
NonHDL: 110.57
Total CHOL/HDL Ratio: 3
Triglycerides: 102 mg/dL (ref 0.0–149.0)
VLDL: 20.4 mg/dL (ref 0.0–40.0)

## 2021-11-08 LAB — HEMOGLOBIN A1C: Hgb A1c MFr Bld: 6.1 % (ref 4.6–6.5)

## 2021-11-08 LAB — TSH: TSH: 4.79 u[IU]/mL (ref 0.35–5.50)

## 2021-11-08 NOTE — Assessment & Plan Note (Signed)
Tolerating statin, encouraged heart healthy diet, avoid trans fats, minimize simple carbs and saturated fats. Increase exercise as tolerated 

## 2021-11-08 NOTE — Patient Instructions (Addendum)
Encouraged increased hydration and fiber in diet. Daily probiotics. If bowels not moving can use MOM 2 tbls po in 4 oz of warm prune juice by mouth every 2-3 days. If no results then repeat in 4 hours with  Dulcolax suppository pr, may repeat again in 4 more hours as needed. Seek care if symptoms worsen. Consider daily Miralax and/or Dulcolax if symptoms persist.   ? ?Bunion ?A bunion (hallux valgus) is a bump that forms slowly on the inner side of the big toe joint. It occurs when the big toe turns toward the second toe. Bunions may be small at first, but they often get larger over time. They can make walking painful. ?What are the causes? ?This condition may be caused by: ?Wearing narrow or pointed shoes that force the big toe to press against the other toes. ?Abnormal foot development that causes the foot to roll inward. ?Changes in the foot that are caused by certain diseases, such as rheumatoid arthritis or polio. ?A foot injury. ?What increases the risk? ?The following factors may make you more likely to develop this condition: ?Wearing shoes that squeeze the toes together. ?Having certain diseases, such as: ?Rheumatoid arthritis. ?Polio. ?Cerebral palsy. ?Having family members who have bunions. ?Being born with abnormally shaped feet (a foot deformity), such as flat feet or low arches. ?Doing activities that put a lot of pressure on the feet, such as ballet dancing. ?What are the signs or symptoms? ?The main symptom of this condition is a bump on your big toe that you can notice. ?Other symptoms may include: ?Pain. ?Redness and inflammation around your big toe. ?Thick or hardened skin on your big toe or between your toes. ?Stiffness or loss of motion in your big toe. ?Trouble with walking. ?How is this diagnosed? ?This condition may be diagnosed based on your symptoms, medical history, and activities. You may also have tests and imaging, such as: ?X-rays. These allow your health care provider to check the  position of the bones in your foot and look for damage to your joint. They also help your health care provider determine the severity of your bunion and the best way to treat it. ?Joint aspiration. In this test, a sample of fluid is removed from the toe joint. This test may be done if you are in a lot of pain. It helps rule out diseases that cause painful swelling of the joints, such as arthritis or gout. ?How is this treated? ?Treatment depends on the severity of your symptoms. The goal of treatment is to relieve symptoms and prevent your bunion from getting worse. Your health care provider may recommend: ?Wearing shoes that have a wide toe box, or using bunion pads to cushion the affected area. ?Taping your toes together to keep them in a normal position. ?Placing a device inside your shoe (orthotic device) to help reduce pressure on your toe joint. ?Taking medicine to ease pain and inflammation. ?Putting ice or heat on the affected area. ?Doing stretching exercises. ?Surgery, for severe cases. ?Follow these instructions at home: ?Managing pain, stiffness, and swelling ?  ?If directed, put ice on the painful area. To do this: ?Put ice in a plastic bag. ?Place a towel between your skin and the bag. ?Leave the ice on for 20 minutes, 2-3 times a day. ?Remove the ice if your skin turns bright red. This is very important. If you cannot feel pain, heat, or cold, you have a greater risk of damage to the area. ?If directed, apply  heat to the affected area before you exercise. Use the heat source that your health care provider recommends, such as a moist heat pack or a heating pad. ?Place a towel between your skin and the heat source. ?Leave the heat on for 20-30 minutes. ?Remove the heat if your skin turns bright red. This is especially important if you are unable to feel pain, heat, or cold. You have a greater risk of getting burned. ?General instructions ?Do exercises as told by your health care provider. ?Support your  toe joint with proper footwear, shoe padding, or taping as told by your health care provider. ?Take over-the-counter and prescription medicines only as told by your health care provider. ?Do not use any products that contain nicotine or tobacco, such as cigarettes, e-cigarettes, and chewing tobacco. If you need help quitting, ask your health care provider. ?Keep all follow-up visits. This is important. ?Contact a health care provider if: ?Your symptoms get worse. ?Your symptoms do not improve in 2 weeks. ?Get help right away if: ?You have severe pain and trouble with walking. ?Summary ?A bunion is a bump on the inner side of the big toe joint that forms when the big toe turns toward the second toe. ?Bunions can make walking painful. ?Treatment depends on the severity of your symptoms. ?Support your toe joint with proper footwear, shoe padding, or taping as told by your health care provider. ?This information is not intended to replace advice given to you by your health care provider. Make sure you discuss any questions you have with your health care provider. ?Document Revised: 11/19/2019 Document Reviewed: 11/19/2019 ?Elsevier Patient Education ? [place]. ? ?

## 2021-11-08 NOTE — Assessment & Plan Note (Signed)
Bilateral hands referred to hand surgeon for eval ?

## 2021-11-08 NOTE — Assessment & Plan Note (Signed)
hgba1c acceptable, minimize simple carbs. Increase exercise as tolerated.  

## 2021-11-08 NOTE — Assessment & Plan Note (Signed)
Encouraged increased hydration and fiber in diet. Daily probiotics. If bowels not moving can use MOM 2 tbls po in 4 oz of warm prune juice by mouth every 2-3 days. If no results then repeat in 4 hours with  Dulcolax suppository pr, may repeat again in 4 more hours as needed. Seek care if symptoms worsen. Consider daily Miralax and/or Dulcolax if symptoms persist.  

## 2021-11-08 NOTE — Assessment & Plan Note (Signed)
Well controlled, no changes to meds. Encouraged heart healthy diet such as the DASH diet and exercise as tolerated.  °

## 2021-11-08 NOTE — Assessment & Plan Note (Signed)
Hydrate and monitor 

## 2021-11-08 NOTE — Assessment & Plan Note (Signed)
Referred to hand surgeon. Due to weakness and pain ?

## 2021-11-09 ENCOUNTER — Other Ambulatory Visit: Payer: Self-pay

## 2021-11-09 DIAGNOSIS — I10 Essential (primary) hypertension: Secondary | ICD-10-CM

## 2021-11-09 NOTE — Telephone Encounter (Signed)
Repeat TSH ordered, lab appointment made for 02/07/22 ?

## 2021-11-14 ENCOUNTER — Ambulatory Visit: Payer: Medicare Other | Admitting: Podiatry

## 2021-11-30 ENCOUNTER — Ambulatory Visit: Payer: Medicare Other | Admitting: Podiatry

## 2021-12-21 ENCOUNTER — Ambulatory Visit (INDEPENDENT_AMBULATORY_CARE_PROVIDER_SITE_OTHER): Payer: Medicare Other | Admitting: Family

## 2021-12-21 VITALS — BP 120/80 | HR 80 | Temp 97.8°F | Resp 16 | Ht 64.0 in | Wt 157.0 lb

## 2021-12-21 DIAGNOSIS — L259 Unspecified contact dermatitis, unspecified cause: Secondary | ICD-10-CM | POA: Diagnosis not present

## 2021-12-21 MED ORDER — PREDNISONE 20 MG PO TABS: 20.0000 mg | ORAL_TABLET | Freq: Every day | ORAL | 0 refills | Status: DC

## 2021-12-21 MED ORDER — TRIAMCINOLONE ACETONIDE 0.1 % EX CREA: 1.0000 "application " | TOPICAL_CREAM | Freq: Two times a day (BID) | CUTANEOUS | 0 refills | Status: DC

## 2021-12-21 NOTE — Progress Notes (Signed)
Laura [name] is a 81 y.o. 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   Educated about COVID-19 virus infection 12/10/2018   Occipital neuralgia 08/07/2017   Constipation 08/07/2017   Spondylosis of cervical joint 05/15/2017   Left foot pain 05/01/2017   Obesity (BMI 30-39.9) 02/21/2017   CKD (chronic kidney disease), stage III ([place]) 02/21/2017   Chest pain 02/20/2017   Hypertension 02/20/2017   HLD (hyperlipidemia) 02/20/2017   Neck pain 01/22/2016   History of colonic polyps 07/15/2015   Preventative health care 02/01/2015   Fibrocystic breast 05/01/2014   Viral URI with cough 04/18/2014   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      hydrochlorothiazide (HYDRODIURIL) 12.5 MG tablet TAKE 1 TABLET BY MOUTH EVERY DAY IN THE MORNING 90 tablet 1   losartan (COZAAR) 25 MG tablet TAKE 1 TABLET (25 MG TOTAL) BY MOUTH DAILY. 90 tablet 1   Multiple Vitamin (MULTIVITAMIN) tablet Take 1 tablet by mouth daily.     naproxen (NAPROSYN) 500 MG tablet Take 500 mg by mouth daily as needed.   1   predniSONE (DELTASONE) 20 MG tablet Take 1 tablet (20 mg total) by mouth daily with breakfast. 5 tablet 0   rosuvastatin (CRESTOR) 20  MG tablet Take 1 tablet (20 mg total) by mouth daily. Patient Needs Office Visit for future refills 30 tablet 3   triamcinolone cream (KENALOG) 0.1 % Apply 1 application. topically 2 (two) times daily. 45 g 0   No current facility-administered medications for this visit.    Allergies: Omnicef [cefdinir], Lisinopril, Statins, Sulfa antibiotics, Tetracyclines & related, and Benadryl [diphenhydramine]  Past Medical History:  Diagnosis Date   Adenomatous colon polyp 03/2012   Cancer ([place])    basal on back and nose   Cataract    Chicken pox as a child   CKD (chronic kidney disease), stage III ([place]) 02/21/2017   DDD (degenerative disc disease), cervical    EBV infection    mono as child   Fibrocystic breast 05/01/2014   GERD (gastroesophageal reflux disease)    pt denies   Hyperlipidemia    Hypertension 20 yrs ago   Measles as a child   Medicare annual wellness visit, subsequent 02/01/2015   Mumps as a child   Neck pain 01/22/2016   Other and unspecified hyperlipidemia 10/13/2013   Overweight(278.02)    PONV (postoperative nausea and vomiting)    pt would like something to prevent nausea   Vasovagal reaction 07/15/2015   no syncope but close     Past Surgical History:  Procedure Laterality Date   ABDOMINAL HYSTERECTOMY  30 yrs ago   heavy bleeding, ovaries left in place   ANTERIOR CERVICAL DECOMP/DISCECTOMY FUSION N/A 05/15/2017  Procedure: ANTERIOR CERVICAL DECOMPRESSION/DISCECTOMY FUSION CERVICL THREE CERVICAL FOUR, CERVICAL FOUR- CERVICAL FIVE;  Surgeon: Ashok Pall, MD;  Location: [place];  Service: Neurosurgery;  Laterality: N/A;   CATARACT EXTRACTION, BILATERAL Bilateral 2012   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  Exercise 4:30 min (4.6 METS)-she reached peak heart rate 146 bpm which is 96% max protected.  No EKG changes.  Normal wall motion. No evidence of ischemia or  infarction.    POLYPECTOMY     TRANSTHORACIC ECHOCARDIOGRAM  01/2017    EF 60 to 65%.  GR 1 DD.  Normal wall motion.  Normal atrial sizes.  Mildly dilated IVC.   VAGINAL HYSTERECTOMY  81 yrs old   uterus removed    Family History  Problem Relation Age of Onset   Heart disease Father    Heart attack Father 27   Hypertension Father    Dementia Maternal Grandmother    Heart disease Maternal Grandfather    Hyperlipidemia Paternal Grandmother    Cancer Paternal Grandfather        stomach   Heart attack Paternal Grandfather    Stomach cancer Paternal Grandfather    Dementia Mother    Pulmonary embolism Son    Vasculitis Son    Other Son        vasculitis led to PE   Colon cancer Neg Hx    Colon polyps Neg Hx    Rectal cancer Neg Hx     Social History   Tobacco Use   Smoking status: Former    Packs/day: 1.00    Years: 16.00    Pack years: 16.00    Types: Cigarettes    Start date: 07/29/1972   Smokeless tobacco: Never  Substance Use Topics   Alcohol use: No    Alcohol/week: 0.0 standard drinks    Subjective:  Patient presents with concerns for possible poison ivy exposure; notes she was pulling some grass in her yard recently; rash started in the past 2-3 days; localized over right side of face/ upper chest and upper left shoulder; is adamant that she cannot tolerate prednisone in high dosages or antihistamines;      Objective:  Vitals:   12/21/21 1049  BP: 120/80  Pulse: 80  Resp: 16  Temp: 97.8 F (36.6 C)  TempSrc: Oral  SpO2: 95%  Weight: 157 lb (71.2 kg)  Height: '5\' 4"'$  (1.626 m)    General: Well developed, well nourished, in no acute distress  Skin : Warm and dry. Linear vesicular rash noted;  Head: Normocephalic and atraumatic  Lungs: Respirations unlabored;  Neurologic: Alert and oriented; speech intact; face symmetrical; moves all extremities well; CNII-XII intact without focal deficit   Assessment:  1. Contact dermatitis, unspecified contact  dermatitis type, unspecified trigger     Plan:  Patient cannot tolerate antihistamine; Rx for Prednisone 20 mg qd x 5 days; Rx for Triamcinolone cream- apply bid; she understands to use OTC hydrocortisone on her face; follow up worse, no better.   No follow-ups on file.  No orders of the defined types were placed in this encounter.   Requested Prescriptions   Signed Prescriptions Disp Refills   predniSONE (DELTASONE) 20 MG tablet 5 tablet 0    Sig: Take 1 tablet (20 mg total) by mouth daily with breakfast.   triamcinolone cream (KENALOG) 0.1 % 45  g 0    Sig: Apply 1 application. topically 2 (two) times daily.

## 2021-12-26 DIAGNOSIS — L237 Allergic contact dermatitis due to plants, except food: Secondary | ICD-10-CM | POA: Diagnosis not present

## 2021-12-27 ENCOUNTER — Other Ambulatory Visit: Payer: Self-pay | Admitting: Family Medicine

## 2021-12-27 ENCOUNTER — Other Ambulatory Visit: Payer: Self-pay | Admitting: Family

## 2022-02-07 ENCOUNTER — Other Ambulatory Visit (INDEPENDENT_AMBULATORY_CARE_PROVIDER_SITE_OTHER): Payer: Medicare Other

## 2022-02-07 DIAGNOSIS — I10 Essential (primary) hypertension: Secondary | ICD-10-CM

## 2022-02-07 LAB — TSH: TSH: 4.24 u[IU]/mL (ref 0.35–5.50)

## 2022-03-14 DIAGNOSIS — H35372 Puckering of macula, left eye: Secondary | ICD-10-CM | POA: Diagnosis not present

## 2022-03-14 DIAGNOSIS — H5213 Myopia, bilateral: Secondary | ICD-10-CM | POA: Diagnosis not present

## 2022-03-14 DIAGNOSIS — H04123 Dry eye syndrome of bilateral lacrimal glands: Secondary | ICD-10-CM | POA: Diagnosis not present

## 2022-03-14 DIAGNOSIS — Z961 Presence of intraocular lens: Secondary | ICD-10-CM | POA: Diagnosis not present

## 2022-03-14 DIAGNOSIS — H26493 Other secondary cataract, bilateral: Secondary | ICD-10-CM | POA: Diagnosis not present

## 2022-03-29 DIAGNOSIS — Z23 Encounter for immunization: Secondary | ICD-10-CM | POA: Diagnosis not present

## 2022-04-17 ENCOUNTER — Telehealth: Payer: Self-pay | Admitting: Licensed Clinical Social Worker

## 2022-04-17 NOTE — Patient Outreach (Signed)
  Care Coordination   04/17/2022 Name: Laura [name] MRN: [id number] DOB: 06-03-1941   Care Coordination Outreach Attempts:  An unsuccessful telephone outreach was attempted today to offer the patient information about available care coordination services as a benefit of their health plan.   Follow Up Plan:  Additional outreach attempts will be made to offer the patient care coordination information and services.   Encounter Outcome:  No Answer  Care Coordination Interventions Activated:  No   Care Coordination Interventions:  No, not indicated    Casimer Lanius, [place] [phone]

## 2022-04-19 DIAGNOSIS — H1132 Conjunctival hemorrhage, left eye: Secondary | ICD-10-CM | POA: Diagnosis not present

## 2022-04-25 ENCOUNTER — Telehealth: Payer: Self-pay | Admitting: Licensed Clinical Social Worker

## 2022-04-25 NOTE — Patient Outreach (Signed)
  Care Coordination   04/25/2022 Name: Laura [name] MRN: [id number] DOB: 07/16/1941   Care Coordination Outreach Attempts:  A second unsuccessful outreach was attempted today to offer the patient with information about available care coordination services as a benefit of their health plan.     Follow Up Plan:  Additional outreach attempts will be made to offer the patient care coordination information and services.   Encounter Outcome:  No Answer  Care Coordination Interventions Activated:  No   Care Coordination Interventions:  No, not indicated    Casimer Lanius, [place] [phone]

## 2022-04-28 ENCOUNTER — Ambulatory Visit
Admission: EM | Admit: 2022-04-28 | Discharge: 2022-04-28 | Disposition: A | Discharge status: Home / Self Care | Payer: Medicare Other | Attending: Urgent Care | Admitting: Urgent Care

## 2022-04-28 ENCOUNTER — Encounter: Payer: Self-pay | Admitting: Emergency Medicine

## 2022-04-28 DIAGNOSIS — L259 Unspecified contact dermatitis, unspecified cause: Secondary | ICD-10-CM | POA: Diagnosis not present

## 2022-04-28 DIAGNOSIS — L299 Pruritus, unspecified: Secondary | ICD-10-CM

## 2022-04-28 MED ORDER — HYDROXYZINE HCL 25 MG PO TABS: 12.5000 mg | ORAL_TABLET | Freq: Three times a day (TID) | ORAL | 0 refills | Status: DC | PRN

## 2022-04-28 MED ORDER — TRIAMCINOLONE ACETONIDE 0.5 % EX OINT: TOPICAL_OINTMENT | Freq: Two times a day (BID) | CUTANEOUS | 0 refills | Status: DC

## 2022-04-28 NOTE — ED Triage Notes (Signed)
Believes she may have poison ivy. States Wednesday she developed a rash on her right arm/shoulder/neck that itches. Dry, non-draining. States everything is itches, even where theres not a rash. Denies any pain. Using triamcinolone cream at home with minor improvement. Says the last time she had this she had to be on prednisone for a month and couldn't tolerate it. Requesting steroid shot today.

## 2022-04-28 NOTE — ED Provider Notes (Signed)
[facility]  Note:  This document was prepared using Systems analyst and may include unintentional dictation errors.  MRN: [id number] DOB: 03-17-1941  Subjective:   Laura [name] is a 81 y.o. female presenting for 4 day history of recurrent rash over the right extremity extending across to the neck area.  Patient believes that it is poison ivy.  Previously she was told by her dermatologist that that is what it was.  She has undergone oral steroids which she did not do well with.  They subsequently did steroid injections.  Patient has an indoor cat.  No dogs.  Does not work outdoors, does not come into contact with drugs, vines, no yard work.  Does not go to [place] where she will come in contact with brush. Denies eating any new foods, starting new medications, exposure to poisonous plants, new hygiene products, new cleaning products or detergents.   No current facility-administered medications for this encounter.  Current Outpatient Medications:    acetaminophen (TYLENOL) 500 MG tablet, Take 500 mg by mouth 2 (two) times daily as needed for mild pain. , Disp: , Rfl:    hydrochlorothiazide (HYDRODIURIL) 12.5 MG tablet, TAKE 1 TABLET BY MOUTH EVERY DAY IN THE MORNING, Disp: 90 tablet, Rfl: 1   losartan (COZAAR) 25 MG tablet, TAKE 1 TABLET (25 MG TOTAL) BY MOUTH DAILY., Disp: 90 tablet, Rfl: 1   Multiple Vitamin (MULTIVITAMIN) tablet, Take 1 tablet by mouth daily., Disp: , Rfl:    naproxen (NAPROSYN) 500 MG tablet, Take 500 mg by mouth daily as needed. , Disp: , Rfl: 1   predniSONE (DELTASONE) 20 MG tablet, Take 1 tablet (20 mg total) by mouth daily with breakfast., Disp: 5 tablet, Rfl: 0   rosuvastatin (CRESTOR) 20 MG tablet, TAKE 1 TABLET (20 MG TOTAL) BY MOUTH DAILY. PATIENT NEEDS OFFICE VISIT FOR FUTURE REFILLS, Disp: 90 tablet, Rfl: 1   triamcinolone cream (KENALOG) 0.1 %, Apply 1 application. topically 2 (two) times daily., Disp: 45 g, Rfl: 0    Allergies  Allergen Reactions   Omnicef [Cefdinir] Nausea And Vomiting   Lisinopril Other (See Comments)    dizzy   Statins Other (See Comments)    Muscle weakness - atorvastatin     Sulfa Antibiotics Other (See Comments)    "feels feverish"   Tetracyclines & Related Other (See Comments)    GI upset   Benadryl [Diphenhydramine] Other (See Comments)    Urinary incontinence stopped when med discontinued    Past Medical History:  Diagnosis Date   Adenomatous colon polyp 03/2012   Cancer ([place])    basal on back and nose   Cataract    Chicken pox as a child   CKD (chronic kidney disease), stage III ([place]) 02/21/2017   DDD (degenerative disc disease), cervical    EBV infection    mono as child   Fibrocystic breast 05/01/2014   GERD (gastroesophageal reflux disease)    pt denies   Hyperlipidemia    Hypertension 20 yrs ago   Measles as a child   Medicare annual wellness visit, subsequent 02/01/2015   Mumps as a child   Neck pain 01/22/2016   Other and unspecified hyperlipidemia 10/13/2013   Overweight(278.02)    PONV (postoperative nausea and vomiting)    pt would like something to prevent nausea   Vasovagal reaction 07/15/2015   no syncope but close      Past Surgical History:  Procedure Laterality Date   ABDOMINAL  HYSTERECTOMY  29 yrs ago   heavy bleeding, ovaries left in place   ANTERIOR CERVICAL DECOMP/DISCECTOMY FUSION N/A 05/15/2017   Procedure: ANTERIOR CERVICAL DECOMPRESSION/DISCECTOMY FUSION CERVICL THREE CERVICAL FOUR, CERVICAL FOUR- CERVICAL FIVE;  Surgeon: Ashok Pall, MD;  Location: [facility];  Service: Neurosurgery;  Laterality: N/A;   CATARACT EXTRACTION, BILATERAL Bilateral 2012   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  Exercise 4:30 min (4.6 METS)-she reached peak heart rate 146 bpm which is 96% max protected.  No EKG changes.  Normal wall motion. No  evidence of ischemia or infarction.    POLYPECTOMY     TRANSTHORACIC ECHOCARDIOGRAM  01/2017    EF 60 to 65%.  GR 1 DD.  Normal wall motion.  Normal atrial sizes.  Mildly dilated IVC.   VAGINAL HYSTERECTOMY  81 yrs old   uterus removed    Family History  Problem Relation Age of Onset   Heart disease Father    Heart attack Father 15   Hypertension Father    Dementia Maternal Grandmother    Heart disease Maternal Grandfather    Hyperlipidemia Paternal Grandmother    Cancer Paternal Grandfather        stomach   Heart attack Paternal Grandfather    Stomach cancer Paternal Grandfather    Dementia Mother    Pulmonary embolism Son    Vasculitis Son    Other Son        vasculitis led to PE   Colon cancer Neg Hx    Colon polyps Neg Hx    Rectal cancer Neg Hx     Social History   Tobacco Use   Smoking status: Former    Packs/day: 1.00    Years: 16.00    Total pack years: 16.00    Types: Cigarettes    Start date: 07/29/1972   Smokeless tobacco: Never  Substance Use Topics   Alcohol use: No    Alcohol/week: 0.0 standard drinks of alcohol   Drug use: No    ROS   Objective:   Vitals: BP 117/77 (BP Location: Left Arm)   Pulse 80   Temp 98 F (36.7 C) (Oral)   Resp 16   SpO2 98%   Physical Exam Constitutional:      General: She is not in acute distress.    Appearance: Normal appearance. She is well-developed. She is not ill-appearing, toxic-appearing or diaphoretic.  HENT:     Head: Normocephalic and atraumatic.     Nose: Nose normal.     Mouth/Throat:     Mouth: Mucous membranes are moist.  Eyes:     General: No scleral icterus.       Right eye: No discharge.        Left eye: No discharge.     Extraocular Movements: Extraocular movements intact.  Cardiovascular:     Rate and Rhythm: Normal rate.  Pulmonary:     Effort: Pulmonary effort is normal.  Skin:    General: Skin is warm and dry.     Findings: Rash (scantly scattered maculopapular pruritic areas  over the right arm extending across the chest and neck to a lesser degree still.) present.  Neurological:     General: No focal deficit present.     Mental Status: She is alert and oriented to person, place, and time.  Psychiatric:  Mood and Affect: Mood normal.        Behavior: Behavior normal.     Assessment and Plan :   PDMP not reviewed this encounter.  1. Contact dermatitis, unspecified contact dermatitis type, unspecified trigger   2. Itching     Low suspicion for Rhus dermatitis.  Patient has a very mild signs of contact dermatitis.  I counseled against steroid use.  Recommended topical steroids.  She would like something stronger and therefore would use triamcinolone ointment.  Advised on appropriate use of this.  Use hydroxyzine for itching. Counseled patient on potential for adverse effects with medications prescribed/recommended today, ER and return-to-clinic precautions discussed, patient verbalized understanding.    Jaynee Eagles, PA-C 04/28/22 [id number]

## 2022-05-10 DIAGNOSIS — H26491 Other secondary cataract, right eye: Secondary | ICD-10-CM | POA: Diagnosis not present

## 2022-05-15 ENCOUNTER — Telehealth: Payer: Self-pay

## 2022-05-15 NOTE — Patient Outreach (Signed)
  Care Coordination   Initial Visit Note   05/15/2022 Name: Laura [name] MRN: [id number] DOB: 03/26/1941  Laura [name] is a 81 y.o. year old female who sees [name], MD for primary care. I spoke with  [name] by phone today.  What matters to the patients health and wellness today?  Patient refused.    SDOH assessments and interventions completed:  No  Care Coordination Interventions Activated:  No  Care Coordination Interventions:  No, not indicated   Follow up plan: No further intervention required.   Encounter Outcome:  Pt. Refused   Thea Silversmith, RN, MSN, BSN, [place] Coordinator [phone]

## 2022-05-16 DIAGNOSIS — H26492 Other secondary cataract, left eye: Secondary | ICD-10-CM | POA: Diagnosis not present

## 2022-05-27 NOTE — Assessment & Plan Note (Signed)
Tolerating statin, encouraged heart healthy diet, avoid trans fats, minimize simple carbs and saturated fats. Increase exercise as tolerated 

## 2022-05-27 NOTE — Assessment & Plan Note (Signed)
Hydrate and monitor 

## 2022-05-27 NOTE — Assessment & Plan Note (Signed)
Well controlled, no changes to meds. Encouraged heart healthy diet such as the DASH diet and exercise as tolerated.  °

## 2022-05-27 NOTE — Assessment & Plan Note (Signed)
Encouraged DASH or MIND diet, decrease po intake and increase exercise as tolerated. Needs 7-8 hours of sleep nightly. Avoid trans fats, eat small, frequent meals every 4-5 hours with lean proteins, complex carbs and healthy fats. Minimize simple carbs, high fat foods and processed foods 

## 2022-05-27 NOTE — Assessment & Plan Note (Addendum)
hgba1c acceptable, minimize simple carbs. Increase exercise as tolerated. Consider COVID booster and Arexvy

## 2022-05-28 ENCOUNTER — Ambulatory Visit (INDEPENDENT_AMBULATORY_CARE_PROVIDER_SITE_OTHER): Payer: Medicare Other | Admitting: Family Medicine

## 2022-05-28 DIAGNOSIS — H547 Unspecified visual loss: Secondary | ICD-10-CM

## 2022-05-28 DIAGNOSIS — E669 Obesity, unspecified: Secondary | ICD-10-CM | POA: Diagnosis not present

## 2022-05-28 DIAGNOSIS — N183 Chronic kidney disease, stage 3 unspecified: Secondary | ICD-10-CM

## 2022-05-28 DIAGNOSIS — I10 Essential (primary) hypertension: Secondary | ICD-10-CM | POA: Diagnosis not present

## 2022-05-28 DIAGNOSIS — E782 Mixed hyperlipidemia: Secondary | ICD-10-CM

## 2022-05-28 DIAGNOSIS — R739 Hyperglycemia, unspecified: Secondary | ICD-10-CM

## 2022-05-28 LAB — COMPREHENSIVE METABOLIC PANEL
ALT: 18 U/L (ref 0–35)
AST: 21 U/L (ref 0–37)
Albumin: 4.1 g/dL (ref 3.5–5.2)
Alkaline Phosphatase: 45 U/L (ref 39–117)
BUN: 16 mg/dL (ref 6–23)
CO2: 29 mEq/L (ref 19–32)
Calcium: 9.2 mg/dL (ref 8.4–10.5)
Chloride: 104 mEq/L (ref 96–112)
Creatinine, Ser: 0.88 mg/dL (ref 0.40–1.20)
GFR: 62.24 mL/min (ref >60.00)
Glucose, Bld: 90 mg/dL (ref 70–99)
Potassium: 3.9 mEq/L (ref 3.5–5.1)
Sodium: 140 mEq/L (ref 135–145)
Total Bilirubin: 0.4 mg/dL (ref 0.2–1.2)
Total Protein: 6.4 g/dL (ref 6.0–8.3)

## 2022-05-28 LAB — HEMOGLOBIN A1C: Hgb A1c MFr Bld: 6.2 % (ref 4.6–6.5)

## 2022-05-28 LAB — LIPID PANEL
Cholesterol: 144 mg/dL (ref 0–200)
HDL: 49.8 mg/dL (ref >39.00)
LDL Cholesterol: 69 mg/dL (ref 0–99)
NonHDL: 93.92
Total CHOL/HDL Ratio: 3
Triglycerides: 123 mg/dL (ref 0.0–149.0)
VLDL: 24.6 mg/dL (ref 0.0–40.0)

## 2022-05-28 LAB — TSH: TSH: 3.4 u[IU]/mL (ref 0.35–5.50)

## 2022-05-28 LAB — CBC
HCT: 38.9 % (ref 36.0–46.0)
Hemoglobin: 13 g/dL (ref 12.0–15.0)
MCHC: 33.5 g/dL (ref 30.0–36.0)
MCV: 89.1 fl (ref 78.0–100.0)
Platelets: 204 10*3/uL (ref 150.0–400.0)
RBC: 4.36 Mil/uL (ref 3.87–5.11)
RDW: 13.4 % (ref 11.5–15.5)
WBC: 5.1 10*3/uL (ref 4.0–10.5)

## 2022-05-28 NOTE — Progress Notes (Signed)
Subjective:   By signing my name below, I, Laura [name], attest that this documentation has been prepared under the direction and in the presence of [name], MD., 05/28/2022.     Patient ID: Laura [name], female    DOB: [birth year], 81 y.o.   MRN: [id number]  Chief Complaint  Patient presents with   Follow-up    Here for follow up   HPI Patient is in today for an office visit.  Dermatitis: She reports that she visited the dermatologist and then the ER on 04/28/22 to manage a recurrent rash on her right forearm. She says that the rash was caused by poison ivy but she has taken both oral and topical medications and the rash has subsided.   Diet: She states that she only eats one meal a day along with small snacks.  Immunizations: She has been informed about receiving COVID-19, high-dose Flu, and RSV immunizations.  Immunization History  Administered Date(s) Administered   Fluad Quad(high Dose 65+) 03/29/2022   Influenza, High Dose Seasonal PF 05/07/2016, 04/24/2017, 04/24/2017, 04/26/2018, 04/02/2020, 04/16/2021   Influenza,inj,Quad PF,6+ Mos 04/29/2014, 07/10/2015, 03/08/2019   Influenza-Unspecified 04/28/2013, 04/26/2018   Moderna Covid-19 Vaccine Bivalent Booster 40yr & up 05/28/2021   Moderna Sars-Covid-2 Vaccination 08/10/2019, 09/07/2019, 05/24/2020, 11/13/2020, 05/28/2021   Pneumococcal Conjugate-13 04/29/2014   Pneumococcal Polysaccharide-23 07/04/2009, 08/08/2010, 04/28/2013   Td 07/29/2010   Tdap 05/10/2021   Zoster Recombinat (Shingrix) 09/04/2021, 11/15/2021   Zoster, Live 04/28/2013   Opthalmology: She reports that she recently saw her ophthalmologist and underwent bilateral cataracts surgery but has not noticed an improvement in her vision.  Past Medical History:  Diagnosis Date   Adenomatous colon polyp 03/2012   [place]    basal on back and nose   Cataract    Chicken pox as a child   CKD (chronic kidney disease), stage III [place] 02/21/2017    DDD (degenerative disc disease), cervical    EBV infection    mono as child   Fibrocystic breast 05/01/2014   GERD (gastroesophageal reflux disease)    pt denies   Hyperlipidemia    Hypertension 20 yrs ago   Measles as a child   Medicare annual wellness visit, subsequent 02/01/2015   Mumps as a child   Neck pain 01/22/2016   Other and unspecified hyperlipidemia 10/13/2013   Overweight(278.02)    PONV (postoperative nausea and vomiting)    pt would like something to prevent nausea   Vasovagal reaction 07/15/2015   no syncope but close    Past Surgical History:  Procedure Laterality Date   ABDOMINAL HYSTERECTOMY  29 yrs ago   heavy bleeding, ovaries left in place   ANTERIOR CERVICAL DECOMP/DISCECTOMY FUSION N/A 05/15/2017   Procedure: ANTERIOR CERVICAL DECOMPRESSION/DISCECTOMY FUSION CERVICL THREE CERVICAL FOUR, CERVICAL FOUR- CERVICAL FIVE;  Surgeon: CAshok Pall MD;  [facility]  Service: Neurosurgery;  Laterality: N/A;   CATARACT EXTRACTION, BILATERAL Bilateral 2012   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  Exercise 4:30 min (4.6 METS)-she reached peak heart rate 146 bpm which is 96% max protected.  No EKG changes.  Normal wall motion. No evidence of ischemia or infarction.    POLYPECTOMY     TRANSTHORACIC ECHOCARDIOGRAM  01/2017    EF 60 to 65%.  GR 1 DD.  Normal wall motion.  Normal atrial  sizes.  Mildly dilated IVC.   VAGINAL HYSTERECTOMY  81 yrs old   uterus removed   Family History  Problem Relation Age of Onset   Heart disease Father    Heart attack Father 42   Hypertension Father    Dementia Maternal Grandmother    Heart disease Maternal Grandfather    Hyperlipidemia Paternal Grandmother    Cancer Paternal Grandfather        stomach   Heart attack Paternal Grandfather    Stomach cancer Paternal Grandfather    Dementia Mother    Pulmonary embolism Son     Vasculitis Son    Other Son        vasculitis led to PE   Colon cancer Neg Hx    Colon polyps Neg Hx    Rectal cancer Neg Hx    Social History   Socioeconomic History   Marital status: Married    Spouse name: Not on file   Number of children: Not on file   Years of education: Not on file   Highest education level: Not on file  Occupational History   Not on file  Tobacco Use   Smoking status: Former    Packs/day: 1.00    Years: 16.00    Total pack years: 16.00    Types: Cigarettes    Start date: 07/29/1972   Smokeless tobacco: Never  Substance and Sexual Activity   Alcohol use: No    Alcohol/week: 0.0 standard drinks of alcohol   Drug use: No   Sexual activity: Never    Comment: lives with husband no major dietary restrictions  Other Topics Concern   Not on file  Social History Narrative   Not on file   Social Determinants of Health   Financial Resource Strain: Not on file  Food Insecurity: Not on file  Transportation Needs: Not on file  Physical Activity: Not on file  Stress: Not on file  Social Connections: Not on file  Intimate Partner Violence: Not on file   Outpatient Medications Prior to Visit  Medication Sig Dispense Refill   acetaminophen (TYLENOL) 500 MG tablet Take 500 mg by mouth 2 (two) times daily as needed for mild pain.      hydrochlorothiazide (HYDRODIURIL) 12.5 MG tablet TAKE 1 TABLET BY MOUTH EVERY DAY IN THE MORNING 90 tablet 1   losartan (COZAAR) 25 MG tablet TAKE 1 TABLET (25 MG TOTAL) BY MOUTH DAILY. 90 tablet 1   Multiple Vitamin (MULTIVITAMIN) tablet Take 1 tablet by mouth daily.     naproxen (NAPROSYN) 500 MG tablet Take 500 mg by mouth daily as needed.   1   rosuvastatin (CRESTOR) 20 MG tablet TAKE 1 TABLET (20 MG TOTAL) BY MOUTH DAILY. PATIENT NEEDS OFFICE VISIT FOR FUTURE REFILLS 90 tablet 1   hydrOXYzine (ATARAX) 25 MG tablet Take 0.5 tablets (12.5 mg total) by mouth every 8 (eight) hours as needed for itching. (Patient not taking:  Reported on 05/28/2022) 30 tablet 0   predniSONE (DELTASONE) 20 MG tablet Take 1 tablet (20 mg total) by mouth daily with breakfast. (Patient not taking: Reported on 05/28/2022) 5 tablet 0   triamcinolone ointment (KENALOG) 0.5 % Apply topically 2 (two) times daily. (Patient not taking: Reported on 05/28/2022) 45 g 0   No facility-administered medications prior to visit.   Allergies  Allergen Reactions   Omnicef [Cefdinir] Nausea And Vomiting   Lisinopril Other (See Comments)    dizzy   Statins Other (See Comments)  Muscle weakness - atorvastatin     Sulfa Antibiotics Other (See Comments)    "feels feverish"   Tetracyclines & Related Other (See Comments)    GI upset   Benadryl [Diphenhydramine] Other (See Comments)    Urinary incontinence stopped when med discontinued   ROS    Objective:    Physical Exam Constitutional:      General: She is not in acute distress.    Appearance: Normal appearance. She is not ill-appearing.  HENT:     Head: Normocephalic and atraumatic.     Right Ear: Tympanic membrane, ear canal and external ear normal.     Left Ear: Tympanic membrane, ear canal and external ear normal.     Mouth/Throat:     Mouth: Mucous membranes are dry.     Pharynx: Oropharynx is clear.  Eyes:     Extraocular Movements: Extraocular movements intact.     Pupils: Pupils are equal, round, and reactive to light.  Neck:     Vascular: No carotid bruit.  Cardiovascular:     Rate and Rhythm: Normal rate and regular rhythm.     Pulses: Normal pulses.     Heart sounds: Normal heart sounds. No murmur heard.    No gallop.  Pulmonary:     Effort: Pulmonary effort is normal. No respiratory distress.     Breath sounds: Normal breath sounds. No wheezing or rales.  Abdominal:     General: Bowel sounds are normal.  Lymphadenopathy:     Cervical: No cervical adenopathy.  Skin:    General: Skin is warm and dry.  Neurological:     Mental Status: She is alert and oriented to  person, place, and time.  Psychiatric:        Mood and Affect: Mood normal.        Behavior: Behavior normal.        Judgment: Judgment normal.    BP 122/78 (BP Location: Right Arm, Patient Position: Sitting, Cuff Size: Normal)   Pulse 83   Temp 98 F (36.7 C) (Oral)   Resp 16   Ht '5\' 4"'$  (1.626 m)   Wt 157 lb (71.2 kg)   SpO2 99%   BMI 26.95 kg/m  Wt Readings from Last 3 Encounters:  05/28/22 157 lb (71.2 kg)  12/21/21 157 lb (71.2 kg)  11/08/21 157 lb (71.2 kg)   Diabetic Foot Exam - Simple   No data filed    Lab Results  Component Value Date   WBC 4.7 11/08/2021   HGB 13.4 11/08/2021   HCT 39.3 11/08/2021   PLT 193.0 11/08/2021   GLUCOSE 86 11/08/2021   CHOL 168 11/08/2021   TRIG 102.0 11/08/2021   HDL 57.40 11/08/2021   LDLDIRECT 157.0 09/13/2019   LDLCALC 90 11/08/2021   ALT 13 11/08/2021   AST 17 11/08/2021   NA 139 11/08/2021   K 4.0 11/08/2021   CL 103 11/08/2021   CREATININE 0.82 11/08/2021   BUN 19 11/08/2021   CO2 29 11/08/2021   TSH 4.24 02/07/2022   HGBA1C 6.1 11/08/2021   Lab Results  Component Value Date   TSH 4.24 02/07/2022   Lab Results  Component Value Date   WBC 4.7 11/08/2021   HGB 13.4 11/08/2021   HCT 39.3 11/08/2021   MCV 88.6 11/08/2021   PLT 193.0 11/08/2021   Lab Results  Component Value Date   NA 139 11/08/2021   K 4.0 11/08/2021   CO2 29 11/08/2021   GLUCOSE 86 11/08/2021  BUN 19 11/08/2021   CREATININE 0.82 11/08/2021   BILITOT 0.4 11/08/2021   ALKPHOS 43 11/08/2021   AST 17 11/08/2021   ALT 13 11/08/2021   PROT 6.4 11/08/2021   ALBUMIN 4.2 11/08/2021   CALCIUM 9.3 11/08/2021   ANIONGAP 10 05/09/2017   GFR 68.00 11/08/2021   Lab Results  Component Value Date   CHOL 168 11/08/2021   Lab Results  Component Value Date   HDL 57.40 11/08/2021   Lab Results  Component Value Date   LDLCALC 90 11/08/2021   Lab Results  Component Value Date   TRIG 102.0 11/08/2021   Lab Results  Component Value Date    CHOLHDL 3 11/08/2021   Lab Results  Component Value Date   HGBA1C 6.1 11/08/2021      Assessment & Plan:   Problem List Items Addressed This Visit     Hypertension (Chronic)    Well controlled, no changes to meds. Encouraged heart healthy diet such as the DASH diet and exercise as tolerated.       Relevant Orders   CBC   TSH   HLD (hyperlipidemia) (Chronic)    Tolerating statin, encouraged heart healthy diet, avoid trans fats, minimize simple carbs and saturated fats. Increase exercise as tolerated      Relevant Orders   Lipid panel   Decreased visual acuity    Has cataracts placed but she developed films on them so they were lasered and she tolerated well      Obesity (BMI 30-39.9)    Encouraged DASH or MIND diet, decrease po intake and increase exercise as tolerated. Needs 7-8 hours of sleep nightly. Avoid trans fats, eat small, frequent meals every 4-5 hours with lean proteins, complex carbs and healthy fats. Minimize simple carbs, high fat foods and processed foods      CKD (chronic kidney disease), stage III ([facility])    Hydrate and monitor      Relevant Orders   Comprehensive metabolic panel   Hyperglycemia    hgba1c acceptable, minimize simple carbs. Increase exercise as tolerated. Consider COVID booster and Arexvy      Relevant Orders   Hemoglobin A1c   No orders of the defined types were placed in this encounter.  I, Penni Homans, MD, personally preformed the services described in this documentation.  All medical record entries made by the scribe were at my direction and in my presence.  I have reviewed the chart and discharge instructions (if applicable) and agree that the record reflects my personal performance and is accurate and complete. 05/28/2022  I,Mohammed Iqbal,acting as a scribe for Penni Homans, MD.,have documented all relevant documentation on the behalf of Penni Homans, MD,as directed by  Penni Homans, MD while in the presence of Penni Homans,  MD.  Penni Homans, MD

## 2022-05-28 NOTE — Patient Instructions (Addendum)
RSV (respiratory syncitial virus) vaccine at pharmacy, Arexvy Covid booster when new version at pharmacy High dose flu shot already [employer] followed by Laura [name] Astringent for any poison ivy exposure Hypertension, Adult High blood pressure (hypertension) is when the force of blood pumping through the arteries is too strong. The arteries are the blood vessels that carry blood from the heart throughout the body. Hypertension forces the heart to work harder to pump blood and may cause arteries to become narrow or stiff. Untreated or uncontrolled hypertension can lead to a heart attack, heart failure, a stroke, kidney disease, and other problems. A blood pressure reading consists of a higher number over a lower number. Ideally, your blood pressure should be below 120/80. The first ("top") number is called the systolic pressure. It is a measure of the pressure in your arteries as your heart beats. The second ("bottom") number is called the diastolic pressure. It is a measure of the pressure in your arteries as the heart relaxes. What are the causes? The exact cause of this condition is not known. There are some conditions that result in high blood pressure. What increases the risk? Certain factors may make you more likely to develop high blood pressure. Some of these risk factors are under your control, including: Smoking. Not getting enough exercise or physical activity. Being overweight. Having too much fat, sugar, calories, or salt (sodium) in your diet. Drinking too much alcohol. Other risk factors include: Having a personal history of heart disease, diabetes, high cholesterol, or kidney disease. Stress. Having a family history of high blood pressure and high cholesterol. Having obstructive sleep apnea. Age. The risk increases with age. What are the signs or symptoms? High blood pressure may not cause symptoms. Very high blood pressure (hypertensive crisis) may cause: Headache. Fast  or irregular heartbeats (palpitations). Shortness of breath. Nosebleed. Nausea and vomiting. Vision changes. Severe chest pain, dizziness, and seizures. How is this diagnosed? This condition is diagnosed by measuring your blood pressure while you are seated, with your arm resting on a flat surface, your legs uncrossed, and your feet flat on the floor. The cuff of the blood pressure monitor will be placed directly against the skin of your upper arm at the level of your heart. Blood pressure should be measured at least twice using the same arm. Certain conditions can cause a difference in blood pressure between your right and left arms. If you have a high blood pressure reading during one visit or you have normal blood pressure with other risk factors, you may be asked to: Return on a different day to have your blood pressure checked again. Monitor your blood pressure at home for 1 week or longer. If you are diagnosed with hypertension, you may have other blood or imaging tests to help your health care provider understand your overall risk for other conditions. How is this treated? This condition is treated by making healthy lifestyle changes, such as eating healthy foods, exercising more, and reducing your alcohol intake. You may be referred for counseling on a healthy diet and physical activity. Your health care provider may prescribe medicine if lifestyle changes are not enough to get your blood pressure under control and if: Your systolic blood pressure is above 130. Your diastolic blood pressure is above 80. Your personal target blood pressure may vary depending on your medical conditions, your age, and other factors. Follow these instructions at home: Eating and drinking  Eat a diet that is high in fiber and  potassium, and low in sodium, added sugar, and fat. An example of this eating plan is called the DASH diet. DASH stands for Dietary Approaches to Stop Hypertension. To eat this way: Eat  plenty of fresh fruits and vegetables. Try to fill one half of your plate at each meal with fruits and vegetables. Eat whole grains, such as whole-wheat pasta, brown rice, or whole-grain bread. Fill about one fourth of your plate with whole grains. Eat or drink low-fat dairy products, such as skim milk or low-fat yogurt. Avoid fatty cuts of meat, processed or cured meats, and poultry with skin. Fill about one fourth of your plate with lean proteins, such as fish, chicken without skin, beans, eggs, or tofu. Avoid pre-made and processed foods. These tend to be higher in sodium, added sugar, and fat. Reduce your daily sodium intake. Many people with hypertension should eat less than 1,500 mg of sodium a day. Do not drink alcohol if: Your health care provider tells you not to drink. You are pregnant, may be pregnant, or are planning to become pregnant. If you drink alcohol: Limit how much you have to: 0-1 drink a day for women. 0-2 drinks a day for men. Know how much alcohol is in your drink. In the [place] one drink equals one 12 oz bottle of beer (355 mL), one 5 oz glass of wine (148 mL), or one 1 oz glass of hard liquor (44 mL). Lifestyle  Work with your health care provider to maintain a healthy body weight or to lose weight. Ask what an ideal weight is for you. Get at least 30 minutes of exercise that causes your heart to beat faster (aerobic exercise) most days of the week. Activities may include walking, swimming, or biking. Include exercise to strengthen your muscles (resistance exercise), such as Pilates or lifting weights, as part of your weekly exercise routine. Try to do these types of exercises for 30 minutes at least 3 days a week. Do not use any products that contain nicotine or tobacco. These products include cigarettes, chewing tobacco, and vaping devices, such as e-cigarettes. If you need help quitting, ask your health care provider. Monitor your blood pressure at home as told by  your health care provider. Keep all follow-up visits. This is important. Medicines Take over-the-counter and prescription medicines only as told by your health care provider. Follow directions carefully. Blood pressure medicines must be taken as prescribed. Do not skip doses of blood pressure medicine. Doing this puts you at risk for problems and can make the medicine less effective. Ask your health care provider about side effects or reactions to medicines that you should watch for. Contact a health care provider if you: Think you are having a reaction to a medicine you are taking. Have headaches that keep coming back (recurring). Feel dizzy. Have swelling in your ankles. Have trouble with your vision. Get help right away if you: Develop a severe headache or confusion. Have unusual weakness or numbness. Feel faint. Have severe pain in your chest or abdomen. Vomit repeatedly. Have trouble breathing. These symptoms may be an emergency. Get help right away. Call 911. Do not wait to see if the symptoms will go away. Do not drive yourself to the hospital. Summary Hypertension is when the force of blood pumping through your arteries is too strong. If this condition is not controlled, it may put you at risk for serious complications. Your personal target blood pressure may vary depending on your medical conditions, your age, and  other factors. For most people, a normal blood pressure is less than 120/80. Hypertension is treated with lifestyle changes, medicines, or a combination of both. Lifestyle changes include losing weight, eating a healthy, low-sodium diet, exercising more, and limiting alcohol. This information is not intended to replace advice given to you by your health care provider. Make sure you discuss any questions you have with your health care provider. Document Revised: 05/22/2021 Document Reviewed: 05/22/2021 Elsevier Patient Education  [place].

## 2022-05-28 NOTE — Assessment & Plan Note (Signed)
Has cataracts placed but she developed films on them so they were lasered and she tolerated well

## 2022-06-17 ENCOUNTER — Other Ambulatory Visit: Payer: Self-pay

## 2022-06-17 ENCOUNTER — Telehealth: Payer: Self-pay | Admitting: Family Medicine

## 2022-06-17 MED ORDER — HYDROCHLOROTHIAZIDE 12.5 MG PO TABS: ORAL_TABLET | ORAL | 1 refills | Status: DC

## 2022-06-17 NOTE — Telephone Encounter (Signed)
Medication sent.

## 2022-06-17 NOTE — Telephone Encounter (Signed)
Medication: hydrochlorothiazide (HYDRODIURIL) 12.5 MG tablet   Has the patient contacted their pharmacy? Yes.   "Pharmacy has tried to reach [place] multiple times with no success"  Preferred Pharmacy (with phone number or street name):  [id number] [place] [address] [place] [address]Phone: [phone] Fax: [phone]   Agent: Please be advised that RX refills may take up to 3 business days. We ask that you follow-up with your pharmacy.

## 2022-06-19 ENCOUNTER — Other Ambulatory Visit: Payer: Self-pay | Admitting: Family Medicine

## 2022-06-21 ENCOUNTER — Other Ambulatory Visit: Payer: Self-pay | Admitting: Family Medicine

## 2022-07-01 DIAGNOSIS — M1812 Unilateral primary osteoarthritis of first carpometacarpal joint, left hand: Secondary | ICD-10-CM | POA: Diagnosis not present

## 2022-07-01 DIAGNOSIS — M79641 Pain in right hand: Secondary | ICD-10-CM | POA: Diagnosis not present

## 2022-07-01 DIAGNOSIS — M79642 Pain in left hand: Secondary | ICD-10-CM | POA: Diagnosis not present

## 2022-07-08 ENCOUNTER — Other Ambulatory Visit (HOSPITAL_BASED_OUTPATIENT_CLINIC_OR_DEPARTMENT_OTHER): Payer: Self-pay | Admitting: Family Medicine

## 2022-07-08 DIAGNOSIS — Z1231 Encounter for screening mammogram for malignant neoplasm of breast: Secondary | ICD-10-CM

## 2022-08-15 ENCOUNTER — Encounter: Payer: Self-pay | Admitting: Family Medicine

## 2022-08-15 ENCOUNTER — Ambulatory Visit (INDEPENDENT_AMBULATORY_CARE_PROVIDER_SITE_OTHER): Payer: Medicare Other | Admitting: Family

## 2022-08-15 ENCOUNTER — Other Ambulatory Visit (HOSPITAL_BASED_OUTPATIENT_CLINIC_OR_DEPARTMENT_OTHER): Payer: Self-pay

## 2022-08-15 ENCOUNTER — Encounter: Payer: Self-pay | Admitting: Family

## 2022-08-15 VITALS — BP 122/62 | HR 82 | Ht 64.0 in | Wt 154.0 lb

## 2022-08-15 DIAGNOSIS — J019 Acute sinusitis, unspecified: Secondary | ICD-10-CM | POA: Diagnosis not present

## 2022-08-15 MED ORDER — FLUTICASONE PROPIONATE 50 MCG/ACT NA SUSP
2.0000 | Freq: Every day | NASAL | 6 refills | Status: AC
  Filled 2022-08-15: qty 16, 30d supply, fill #0

## 2022-08-15 MED ORDER — AZITHROMYCIN 250 MG PO TABS
ORAL_TABLET | ORAL | 0 refills | Status: DC
  Filled 2022-08-15: qty 6, 5d supply, fill #0

## 2022-08-15 NOTE — Progress Notes (Signed)
Laura [name] is a 82 y.o. 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   Educated about COVID-19 virus infection 12/10/2018   Occipital neuralgia 08/07/2017   Constipation 08/07/2017   Spondylosis of cervical joint 05/15/2017   Left foot pain 05/01/2017   Obesity (BMI 30-39.9) 02/21/2017   CKD (chronic kidney disease), stage III ([place]) 02/21/2017   Chest pain 02/20/2017   Hypertension 02/20/2017   HLD (hyperlipidemia) 02/20/2017   Neck pain 01/22/2016   History of colonic polyps 07/15/2015   Preventative health care 02/01/2015   Fibrocystic breast 05/01/2014   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      azithromycin (ZITHROMAX Z-PAK) 250 MG tablet Take 2 tablets (500 mg) by mouth today, then take 1 tablet (250 mg) daily for 4 days. 6 tablet 0   fluticasone (FLONASE) 50 MCG/ACT nasal spray Place 2 sprays into both nostrils daily. 16 g 6   hydrochlorothiazide (HYDRODIURIL) 12.5 MG tablet TAKE 1 TABLET BY MOUTH EVERY DAY IN THE MORNING 90 tablet 1   losartan (COZAAR) 25 MG tablet TAKE 1 TABLET (25 MG TOTAL) BY MOUTH DAILY. 90 tablet 1   Multiple Vitamin (MULTIVITAMIN) tablet Take 1 tablet by mouth daily.     naproxen (NAPROSYN) 500 MG tablet Take 500 mg by mouth daily as  needed.   1   hydrOXYzine (ATARAX) 25 MG tablet Take 0.5 tablets (12.5 mg total) by mouth every 8 (eight) hours as needed for itching. (Patient not taking: Reported on 05/28/2022) 30 tablet 0   rosuvastatin (CRESTOR) 20 MG tablet Take 1 tablet (20 mg total) by mouth daily. 90 tablet 1   triamcinolone ointment (KENALOG) 0.5 % Apply topically 2 (two) times daily. (Patient not taking: Reported on 05/28/2022) 45 g 0   No current facility-administered medications for this visit.    Allergies: Omnicef [cefdinir], Dust mite extract, Lisinopril, Statins, Sulfa antibiotics, Tetracyclines & related, and Benadryl [diphenhydramine]  Past Medical History:  Diagnosis Date   Adenomatous colon polyp 03/2012   Cancer ([place])    basal on back and nose   Cataract    Chicken pox as a child   CKD (chronic kidney disease), stage III ([place]) 02/21/2017   DDD (degenerative disc disease), cervical    EBV infection    mono as child   Fibrocystic breast 05/01/2014   GERD (gastroesophageal reflux disease)    pt denies   Hyperlipidemia    Hypertension 20 yrs ago   Measles as a child   Medicare annual wellness visit, subsequent 02/01/2015   Mumps as a child   Neck pain 01/22/2016   Other and unspecified hyperlipidemia 10/13/2013   Overweight(278.02)    PONV (postoperative nausea and vomiting)    pt would like something to prevent nausea   Vasovagal reaction 07/15/2015  no syncope but close     Past Surgical History:  Procedure Laterality Date   ABDOMINAL HYSTERECTOMY  30 yrs ago   heavy bleeding, ovaries left in place   ANTERIOR CERVICAL DECOMP/DISCECTOMY FUSION N/A 05/15/2017   Procedure: ANTERIOR CERVICAL DECOMPRESSION/DISCECTOMY FUSION CERVICL THREE CERVICAL FOUR, CERVICAL FOUR- CERVICAL FIVE;  Surgeon: Ashok Pall, MD;  Location: [facility];  Service: Neurosurgery;  Laterality: N/A;   CATARACT EXTRACTION, BILATERAL Bilateral 2012   c/o blepharospasms since then   COLONOSCOPY     EYE SURGERY  11-27-2010    cataract surgery in both eyes   NM MYOVIEW LTD  04/2017   EF 65-70%.  Exercise 4:30 min (4.6 METS)-she reached peak heart rate 146 bpm which is 96% max protected.  No EKG changes.  Normal wall motion. No evidence of ischemia or infarction.    POLYPECTOMY     TRANSTHORACIC ECHOCARDIOGRAM  01/2017    EF 60 to 65%.  GR 1 DD.  Normal wall motion.  Normal atrial sizes.  Mildly dilated IVC.   VAGINAL HYSTERECTOMY  82 yrs old   uterus removed    Family History  Problem Relation Age of Onset   Heart disease Father    Heart attack Father 79   Hypertension Father    Dementia Maternal Grandmother    Heart disease Maternal Grandfather    Hyperlipidemia Paternal Grandmother    Cancer Paternal Grandfather        stomach   Heart attack Paternal Grandfather    Stomach cancer Paternal Grandfather    Dementia Mother    Pulmonary embolism Son    Vasculitis Son    Other Son        vasculitis led to PE   Colon cancer Neg Hx    Colon polyps Neg Hx    Rectal cancer Neg Hx     Social History   Tobacco Use   Smoking status: Former    Packs/day: 1.00    Years: 16.00    Total pack years: 16.00    Types: Cigarettes    Start date: 07/29/1972   Smokeless tobacco: Never  Substance Use Topics   Alcohol use: No    Alcohol/week: 0.0 standard drinks of alcohol    Subjective:   3 week history of sinus pain/ pressure; using OTC Mucinex; + PND; no chest pain or chest congestion; has done well on Z-pak in the past;     Objective:  Vitals:   08/15/22 1428  BP: 122/62  Pulse: 82  SpO2: 98%  Weight: 154 lb (69.9 kg)  Height: '5\' 4"'$  (1.626 m)    General: Well developed, well nourished, in no acute distress  Skin : Warm and dry.  Head: Normocephalic and atraumatic  Eyes: Sclera and conjunctiva clear; pupils round and reactive to light; extraocular movements intact  Ears: External normal; canals clear; tympanic membranes normal  Oropharynx: Pink, supple. No suspicious lesions  Neck: Supple without  thyromegaly, adenopathy  Lungs: Respirations unlabored; clear to auscultation bilaterally without wheeze, rales, rhonchi  CVS exam: normal rate and regular rhythm.  Neurologic: Alert and oriented; speech intact; face symmetrical; moves all extremities well; CNII-XII intact without focal deficit   Assessment:  1. Acute sinusitis, recurrence not specified, unspecified location     Plan:   Rx for Z-pak #1 take as directed; Rx for Flonase; increase fluids, rest and follow up worse, no better.    No follow-ups on file.  No orders of the defined types were placed in this  encounter.   Requested Prescriptions   Signed Prescriptions Disp Refills   azithromycin (ZITHROMAX Z-PAK) 250 MG tablet 6 tablet 0    Sig: Take 2 tablets (500 mg) by mouth today, then take 1 tablet (250 mg) daily for 4 days.   fluticasone (FLONASE) 50 MCG/ACT nasal spray 16 g 6    Sig: Place 2 sprays into both nostrils daily.

## 2022-08-26 ENCOUNTER — Encounter (HOSPITAL_BASED_OUTPATIENT_CLINIC_OR_DEPARTMENT_OTHER): Payer: Self-pay

## 2022-08-26 ENCOUNTER — Ambulatory Visit (HOSPITAL_BASED_OUTPATIENT_CLINIC_OR_DEPARTMENT_OTHER)
Admission: RE | Admit: 2022-08-26 | Discharge: 2022-08-26 | Disposition: A | Discharge status: Home / Self Care | Payer: Medicare Other | Source: Ambulatory Visit | Attending: Family Medicine | Admitting: Family Medicine

## 2022-08-26 DIAGNOSIS — Z1231 Encounter for screening mammogram for malignant neoplasm of breast: Secondary | ICD-10-CM | POA: Insufficient documentation

## 2022-09-25 ENCOUNTER — Encounter: Payer: Self-pay | Admitting: Family Medicine

## 2022-09-27 ENCOUNTER — Telehealth: Payer: Self-pay | Admitting: Internal Medicine

## 2022-09-27 MED ORDER — MOLNUPIRAVIR EUA 200MG CAPSULE: 4.0000 | ORAL_CAPSULE | Freq: Two times a day (BID) | ORAL | 0 refills | Status: AC

## 2022-09-27 NOTE — Telephone Encounter (Signed)
Called pt was advised per Dr.Paz he will send in medication to treat Covid  To make sure drinking lots of fluids and if symptom worsens will need to go ER. Pt stated she understand.

## 2022-09-27 NOTE — Telephone Encounter (Signed)
I just made aware of the situation now. Patient is 82 years old, no recent renal function, symptoms started 2 days ago. It is Friday, she cannot come to the office, no recent renal function consequently I can only prescribe molnupiravir.  Please call patient: Start molnupiravir ASAP Rest, fluids, Tylenol, Mucinex. If she is very ill (high fever, chest pain, shortness of breath, dizziness): Go to the ER

## 2022-09-27 NOTE — Telephone Encounter (Signed)
Pt is requesting medication sent in to help with covid. Her husband, MRN: [id number] tested + 2/24 and her sxs started 2/28 and she tested + then as well. They are not able to see their pcp for a virtual appt so they are hoping medication can be sent in. Her husband is outside of the window for paxlovid, but she asked if anything could be sent in to help both of them with their sxs. Sxs include coughing, chills headache congestion, sore throat. Please advise.    [id number] [place] [place] [facility] [address] [id number]: [phone] Fax: [phone]

## 2022-10-04 ENCOUNTER — Ambulatory Visit (INDEPENDENT_AMBULATORY_CARE_PROVIDER_SITE_OTHER): Payer: Medicare Other | Admitting: Family

## 2022-10-04 VITALS — BP 142/60 | HR 73 | Temp 98.0°F | Resp 16 | Wt 155.0 lb

## 2022-10-04 DIAGNOSIS — I10 Essential (primary) hypertension: Secondary | ICD-10-CM

## 2022-10-04 DIAGNOSIS — R6889 Other general symptoms and signs: Secondary | ICD-10-CM | POA: Diagnosis not present

## 2022-10-04 NOTE — Assessment & Plan Note (Addendum)
SBP very mildly elevated today. Will monitor for now on current doses of cozaar and hctz. She has follow up with PCP in April.

## 2022-10-04 NOTE — Assessment & Plan Note (Signed)
I don't see any evidence of bacterial infection at this time.  Lungs are clear, no significant sinus pain/drainage.  I think that this is some residual congestion which should continue to improve. Advised pt ok to continue mucinex prn. Could also try nasal saline rinses and coricidin.  She will let me know symptoms do not improve.

## 2022-10-04 NOTE — Progress Notes (Signed)
Subjective:   By signing my name below, I, [name], attest that this documentation has been prepared under the direction and in the presence of Debbrah Alar, NP.  10/04/2022.   Patient ID: Laura [name], female    DOB: 15-Sep-1940, 82 y.o.   MRN: [id number]  Chief Complaint  Patient presents with   Covid Positive    Tested positive 09/25/22 and 10/02/22   Nasal Congestion    Complains of congestion that is not getting better     HPI Patient is in today for an office visit.  Post-Covid:  Her symptoms began last week Tuesday night. She had a positive home Covid test last Wednesday. At the time she had cough, and a low grade fever for 2 days. Currently she complains of significant congestion and phlegm that she is unable to cough up or swallow. There is not much mucous production when blowing her nose. She has no sense of taste or smell; she often forces herself to eat. No fatigue. For treatment she has tried Mucinex and Mucinex-dm, with limited relief. She was prescribed paxlovid last week, but she did not start this as it wasn't covered by her insurance.  Blood pressure: In clinic today her blood pressure is 142/60 BP Readings from Last 3 Encounters:  10/04/22 (!) 142/60  08/15/22 122/62  05/28/22 122/78   Past Medical History:  Diagnosis Date   Adenomatous colon polyp 03/2012   Cancer ([place])    basal on back and nose   Cataract    Chicken pox as a child   CKD (chronic kidney disease), stage III ([place]) 02/21/2017   DDD (degenerative disc disease), cervical    EBV infection    mono as child   Fibrocystic breast 05/01/2014   GERD (gastroesophageal reflux disease)    pt denies   Hyperlipidemia    Hypertension 20 yrs ago   Measles as a child   Medicare annual wellness visit, subsequent 02/01/2015   Mumps as a child   Neck pain 01/22/2016   Other and unspecified hyperlipidemia 10/13/2013   Overweight(278.02)    PONV (postoperative nausea and vomiting)    pt would like  something to prevent nausea   Vasovagal reaction 07/15/2015   no syncope but close     Past Surgical History:  Procedure Laterality Date   ABDOMINAL HYSTERECTOMY  29 yrs ago   heavy bleeding, ovaries left in place   ANTERIOR CERVICAL DECOMP/DISCECTOMY FUSION N/A 05/15/2017   Procedure: ANTERIOR CERVICAL DECOMPRESSION/DISCECTOMY FUSION CERVICL THREE CERVICAL FOUR, CERVICAL FOUR- CERVICAL FIVE;  Surgeon: Ashok Pall, MD;  Location: [facility];  Service: Neurosurgery;  Laterality: N/A;   CATARACT EXTRACTION, BILATERAL Bilateral 2012   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  Exercise 4:30 min (4.6 METS)-she reached peak heart rate 146 bpm which is 96% max protected.  No EKG changes.  Normal wall motion. No evidence of ischemia or infarction.    POLYPECTOMY     TRANSTHORACIC ECHOCARDIOGRAM  01/2017    EF 60 to 65%.  GR 1 DD.  Normal wall motion.  Normal atrial sizes.  Mildly dilated IVC.   VAGINAL HYSTERECTOMY  82 yrs old   uterus removed    Family History  Problem Relation Age of Onset   Heart disease Father    Heart attack Father 82   Hypertension Father  Dementia Maternal Grandmother    Heart disease Maternal Grandfather    Hyperlipidemia Paternal Grandmother    Cancer Paternal Grandfather        stomach   Heart attack Paternal Grandfather    Stomach cancer Paternal Grandfather    Dementia Mother    Pulmonary embolism Son    Vasculitis Son    Other Son        vasculitis led to PE   Colon cancer Neg Hx    Colon polyps Neg Hx    Rectal cancer Neg Hx     Social History   Socioeconomic History   Marital status: Married    Spouse name: Not on file   Number of children: Not on file   Years of education: Not on file   Highest education level: Not on file  Occupational History   Not on file  Tobacco Use   Smoking status: Former    Packs/day: 1.00    Years: 16.00     Total pack years: 16.00    Types: Cigarettes    Start date: 07/29/1972   Smokeless tobacco: Never  Substance and Sexual Activity   Alcohol use: No    Alcohol/week: 0.0 standard drinks of alcohol   Drug use: No   Sexual activity: Never    Comment: lives with husband no major dietary restrictions  Other Topics Concern   Not on file  Social History Narrative   Not on file   Social Determinants of Health   Financial Resource Strain: Not on file  Food Insecurity: Not on file  Transportation Needs: Not on file  Physical Activity: Not on file  Stress: Not on file  Social Connections: Not on file  Intimate Partner Violence: Not on file    Outpatient Medications Prior to Visit  Medication Sig Dispense Refill   acetaminophen (TYLENOL) 500 MG tablet Take 500 mg by mouth 2 (two) times daily as needed for mild pain.      fluticasone (FLONASE) 50 MCG/ACT nasal spray Place 2 sprays into both nostrils daily. 16 g 6   hydrochlorothiazide (HYDRODIURIL) 12.5 MG tablet TAKE 1 TABLET BY MOUTH EVERY DAY IN THE MORNING 90 tablet 1   losartan (COZAAR) 25 MG tablet TAKE 1 TABLET (25 MG TOTAL) BY MOUTH DAILY. 90 tablet 1   Multiple Vitamin (MULTIVITAMIN) tablet Take 1 tablet by mouth daily.     naproxen (NAPROSYN) 500 MG tablet Take 500 mg by mouth daily as needed.   1   rosuvastatin (CRESTOR) 20 MG tablet Take 1 tablet (20 mg total) by mouth daily. 90 tablet 1   azithromycin (ZITHROMAX Z-PAK) 250 MG tablet Take 2 tablets (500 mg) by mouth today, then take 1 tablet (250 mg) daily for 4 days. 6 tablet 0   hydrOXYzine (ATARAX) 25 MG tablet Take 0.5 tablets (12.5 mg total) by mouth every 8 (eight) hours as needed for itching. (Patient not taking: Reported on 05/28/2022) 30 tablet 0   triamcinolone ointment (KENALOG) 0.5 % Apply topically 2 (two) times daily. (Patient not taking: Reported on 05/28/2022) 45 g 0   No facility-administered medications prior to visit.    Allergies  Allergen Reactions    Omnicef [Cefdinir] Nausea And Vomiting   Dust Mite Extract    Lisinopril Other (See Comments)    dizzy   Statins Other (See Comments)    Muscle weakness - atorvastatin     Sulfa Antibiotics Other (See Comments)    "feels feverish"   Tetracyclines & Related Other (  See Comments)    GI upset   Benadryl [Diphenhydramine] Other (See Comments)    Urinary incontinence stopped when med discontinued    Review of Systems  Constitutional:  Negative for fever.  HENT:  Positive for congestion. Negative for sinus pain and sore throat.   Respiratory:  Positive for cough. Negative for shortness of breath and wheezing.   Cardiovascular:  Negative for chest pain and palpitations.  Gastrointestinal:  Negative for blood in stool, constipation, diarrhea, nausea and vomiting.  Genitourinary:  Negative for dysuria, frequency and hematuria.  Musculoskeletal:  Negative for joint pain and myalgias.  Neurological:  Positive for sensory change (Loss of taste and smell.).       Objective:    Physical Exam Constitutional:      Appearance: Normal appearance.  HENT:     Head: Normocephalic and atraumatic.     Right Ear: Tympanic membrane, ear canal and external ear normal.     Left Ear: Tympanic membrane, ear canal and external ear normal.     Mouth/Throat:     Mouth: Mucous membranes are moist.     Pharynx: Oropharynx is clear. No oropharyngeal exudate or posterior oropharyngeal erythema.  Eyes:     Extraocular Movements: Extraocular movements intact.     Pupils: Pupils are equal, round, and reactive to light.  Cardiovascular:     Rate and Rhythm: Normal rate and regular rhythm.     Heart sounds: Normal heart sounds. No murmur heard.    No gallop.  Pulmonary:     Effort: Pulmonary effort is normal. No respiratory distress.     Breath sounds: Normal breath sounds. No wheezing or rales.  Skin:    General: Skin is warm and dry.  Neurological:     General: No focal deficit present.     Mental  Status: She is alert and oriented to person, place, and time.  Psychiatric:        Mood and Affect: Mood normal.        Behavior: Behavior normal.     BP (!) 142/60 (BP Location: Right Arm, Patient Position: Sitting, Cuff Size: Small)   Pulse 73   Temp 98 F (36.7 C) (Oral)   Resp 16   Wt 155 lb (70.3 kg)   SpO2 99%   BMI 26.61 kg/m  Wt Readings from Last 3 Encounters:  10/04/22 155 lb (70.3 kg)  08/15/22 154 lb (69.9 kg)  05/28/22 157 lb (71.2 kg)       Assessment & Plan:   Problem List Items Addressed This Visit       Unprioritized   Hypertension (Chronic)    SBP very mildly elevated today. Will monitor for now on current doses of cozaar and hctz. She has follow up with PCP in April.       Congestion of throat - Primary    I don't see any evidence of bacterial infection at this time.  Lungs are clear, no significant sinus pain/drainage.  I think that this is some residual congestion which should continue to improve. Advised pt ok to continue mucinex prn. Could also try nasal saline rinses and coricidin.  She will let me know symptoms do not improve.         No orders of the defined types were placed in this encounter.   I, Nance Pear, NP, personally preformed the services described in this documentation.  All medical record entries made by the scribe were at my direction and in my presence.  I have reviewed the chart and discharge instructions (if applicable) and agree that the record reflects my personal performance and is accurate and complete. 10/04/2022.  I,Mathew Stumpf,acting as a Education administrator for [employer], NP.,have documented all relevant documentation on the behalf of Nance Pear, NP,as directed by  Nance Pear, NP while in the presence of Nance Pear, NP.   Nance Pear, NP

## 2022-11-05 ENCOUNTER — Other Ambulatory Visit: Payer: Self-pay | Admitting: Family Medicine

## 2022-11-26 ENCOUNTER — Encounter: Payer: Self-pay | Admitting: Family Medicine

## 2022-11-26 ENCOUNTER — Ambulatory Visit (INDEPENDENT_AMBULATORY_CARE_PROVIDER_SITE_OTHER): Payer: Medicare Other | Admitting: Family Medicine

## 2022-11-26 VITALS — BP 126/78 | HR 77 | Temp 97.5°F | Resp 16 | Ht 64.0 in | Wt 153.0 lb

## 2022-11-26 DIAGNOSIS — Z8616 Personal history of COVID-19: Secondary | ICD-10-CM | POA: Diagnosis not present

## 2022-11-26 DIAGNOSIS — R739 Hyperglycemia, unspecified: Secondary | ICD-10-CM | POA: Diagnosis not present

## 2022-11-26 DIAGNOSIS — I10 Essential (primary) hypertension: Secondary | ICD-10-CM | POA: Diagnosis not present

## 2022-11-26 DIAGNOSIS — E782 Mixed hyperlipidemia: Secondary | ICD-10-CM | POA: Diagnosis not present

## 2022-11-26 DIAGNOSIS — N183 Chronic kidney disease, stage 3 unspecified: Secondary | ICD-10-CM | POA: Diagnosis not present

## 2022-11-26 LAB — TSH: TSH: 3.49 u[IU]/mL (ref 0.35–5.50)

## 2022-11-26 LAB — CBC WITH DIFFERENTIAL/PLATELET
Basophils Absolute: 0 10*3/uL (ref 0.0–0.1)
Basophils Relative: 0.6 % (ref 0.0–3.0)
Eosinophils Absolute: 0.1 10*3/uL (ref 0.0–0.7)
Eosinophils Relative: 1.2 % (ref 0.0–5.0)
HCT: 40.8 % (ref 36.0–46.0)
Hemoglobin: 13.6 g/dL (ref 12.0–15.0)
Lymphocytes Relative: 35.8 % (ref 12.0–46.0)
Lymphs Abs: 1.9 10*3/uL (ref 0.7–4.0)
MCHC: 33.3 g/dL (ref 30.0–36.0)
MCV: 89 fl (ref 78.0–100.0)
Monocytes Absolute: 0.8 10*3/uL (ref 0.1–1.0)
Monocytes Relative: 14.2 % — ABNORMAL HIGH (ref 3.0–12.0)
Neutro Abs: 2.6 10*3/uL (ref 1.4–7.7)
Neutrophils Relative %: 48.2 % (ref 43.0–77.0)
Platelets: 225 10*3/uL (ref 150.0–400.0)
RBC: 4.58 Mil/uL (ref 3.87–5.11)
RDW: 14.3 % (ref 11.5–15.5)
WBC: 5.3 10*3/uL (ref 4.0–10.5)

## 2022-11-26 LAB — COMPREHENSIVE METABOLIC PANEL
ALT: 12 U/L (ref 0–35)
AST: 16 U/L (ref 0–37)
Albumin: 4.2 g/dL (ref 3.5–5.2)
Alkaline Phosphatase: 40 U/L (ref 39–117)
BUN: 18 mg/dL (ref 6–23)
CO2: 29 mEq/L (ref 19–32)
Calcium: 9.5 mg/dL (ref 8.4–10.5)
Chloride: 103 mEq/L (ref 96–112)
Creatinine, Ser: 0.93 mg/dL (ref 0.40–1.20)
GFR: 58.04 mL/min — ABNORMAL LOW (ref >60.00)
Glucose, Bld: 87 mg/dL (ref 70–99)
Potassium: 3.8 mEq/L (ref 3.5–5.1)
Sodium: 140 mEq/L (ref 135–145)
Total Bilirubin: 0.4 mg/dL (ref 0.2–1.2)
Total Protein: 6.6 g/dL (ref 6.0–8.3)

## 2022-11-26 LAB — LIPID PANEL
Cholesterol: 162 mg/dL (ref 0–200)
HDL: 56 mg/dL (ref >39.00)
LDL Cholesterol: 80 mg/dL (ref 0–99)
NonHDL: 105.9
Total CHOL/HDL Ratio: 3
Triglycerides: 132 mg/dL (ref 0.0–149.0)
VLDL: 26.4 mg/dL (ref 0.0–40.0)

## 2022-11-26 LAB — HEMOGLOBIN A1C: Hgb A1c MFr Bld: 6 % (ref 4.6–6.5)

## 2022-11-26 NOTE — Assessment & Plan Note (Signed)
In February but has fully recovered

## 2022-11-26 NOTE — Assessment & Plan Note (Signed)
Hydrate and monitor 

## 2022-11-26 NOTE — Progress Notes (Signed)
Subjective:   By signing my name below, I, Vickey Sages, attest that this documentation has been prepared under the direction and in the presence of Bradd Canary, MD., 11/26/2022.   Patient ID: Laura [name], female    DOB: 09-07-1940, 82 y.o.   MRN: [id number]  Chief Complaint  Patient presents with   Follow-up    Follow up   HPI Patient is in today for an office visit.  History of COVID-19 Patient is doing well today but reports that she tested positive for COVID-19 in 08/2022. She denies having residual symptoms such as fever/chills/headache/shortness of breath and states that she is up to date on COVID-19 vaccinations. She has been encouraged to take annual COVID-19 and Influenza vaccinations due to her age and high risk of contracting these viruses.   Hyperglycemia  Patient's last HGBA1C from 05/28/2022 was slightly elevated at 6.2% and this will be rechecked today. She denies any urinary or GI symptoms today.  Past Medical History:  Diagnosis Date   Adenomatous colon polyp 03/2012   Cancer ([facility])    basal on back and nose   Cataract    Chicken pox as a child   CKD (chronic kidney disease), stage III ([facility]) 02/21/2017   DDD (degenerative disc disease), cervical    EBV infection    mono as child   Fibrocystic breast 05/01/2014   GERD (gastroesophageal reflux disease)    pt denies   Hyperlipidemia    Hypertension 20 yrs ago   Measles as a child   Medicare annual wellness visit, subsequent 02/01/2015   Mumps as a child   Neck pain 01/22/2016   Other and unspecified hyperlipidemia 10/13/2013   Overweight(278.02)    PONV (postoperative nausea and vomiting)    pt would like something to prevent nausea   Vasovagal reaction 07/15/2015   no syncope but close     Past Surgical History:  Procedure Laterality Date   ABDOMINAL HYSTERECTOMY  29 yrs ago   heavy bleeding, ovaries left in place   ANTERIOR CERVICAL DECOMP/DISCECTOMY FUSION N/A 05/15/2017   Procedure:  ANTERIOR CERVICAL DECOMPRESSION/DISCECTOMY FUSION CERVICL THREE CERVICAL FOUR, CERVICAL FOUR- CERVICAL FIVE;  Surgeon: Coletta Memos, MD;  Location: [facility];  Service: Neurosurgery;  Laterality: N/A;   CATARACT EXTRACTION, BILATERAL Bilateral 2012   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  Exercise 4:30 min (4.6 METS)-she reached peak heart rate 146 bpm which is 96% max protected.  No EKG changes.  Normal wall motion. No evidence of ischemia or infarction.    POLYPECTOMY     TRANSTHORACIC ECHOCARDIOGRAM  01/2017    EF 60 to 65%.  GR 1 DD.  Normal wall motion.  Normal atrial sizes.  Mildly dilated IVC.   VAGINAL HYSTERECTOMY  82 yrs old   uterus removed    Family History  Problem Relation Age of Onset   Heart disease Father    Heart attack Father 19   Hypertension Father    Dementia Maternal Grandmother    Heart disease Maternal Grandfather    Hyperlipidemia Paternal Grandmother    Cancer Paternal Grandfather        stomach   Heart attack Paternal Grandfather    Stomach cancer Paternal Grandfather    Dementia Mother    Pulmonary embolism Son    Vasculitis Son    Other  Son        vasculitis led to PE   Colon cancer Neg Hx    Colon polyps Neg Hx    Rectal cancer Neg Hx     Social History   Socioeconomic History   Marital status: Married    Spouse name: Not on file   Number of children: Not on file   Years of education: Not on file   Highest education level: Not on file  Occupational History   Not on file  Tobacco Use   Smoking status: Former    Packs/day: 1.00    Years: 16.00    Additional pack years: 0.00    Total pack years: 16.00    Types: Cigarettes    Start date: 07/29/1972   Smokeless tobacco: Never  Substance and Sexual Activity   Alcohol use: No    Alcohol/week: 0.0 standard drinks of alcohol   Drug use: No   Sexual activity: Never    Comment: lives with  husband no major dietary restrictions  Other Topics Concern   Not on file  Social History Narrative   Not on file   Social Determinants of Health   Financial Resource Strain: Not on file  Food Insecurity: Not on file  Transportation Needs: Not on file  Physical Activity: Not on file  Stress: Not on file  Social Connections: Not on file  Intimate Partner Violence: Not on file    Outpatient Medications Prior to Visit  Medication Sig Dispense Refill   acetaminophen (TYLENOL) 500 MG tablet Take 500 mg by mouth 2 (two) times daily as needed for mild pain.      fluticasone (FLONASE) 50 MCG/ACT nasal spray Place 2 sprays into both nostrils daily. 16 g 6   hydrochlorothiazide (HYDRODIURIL) 12.5 MG tablet TAKE 1 TABLET BY MOUTH EVERY DAY IN THE MORNING 90 tablet 1   losartan (COZAAR) 25 MG tablet TAKE 1 TABLET (25 MG TOTAL) BY MOUTH DAILY. 90 tablet 1   Multiple Vitamin (MULTIVITAMIN) tablet Take 1 tablet by mouth daily.     naproxen (NAPROSYN) 500 MG tablet Take 500 mg by mouth daily as needed.   1   rosuvastatin (CRESTOR) 20 MG tablet Take 1 tablet (20 mg total) by mouth daily. 90 tablet 1   No facility-administered medications prior to visit.    Allergies  Allergen Reactions   Omnicef [Cefdinir] Nausea And Vomiting   Dust Mite Extract    Lisinopril Other (See Comments)    dizzy   Statins Other (See Comments)    Muscle weakness - atorvastatin     Sulfa Antibiotics Other (See Comments)    "feels feverish"   Tetracyclines & Related Other (See Comments)    GI upset   Benadryl [Diphenhydramine] Other (See Comments)    Urinary incontinence stopped when med discontinued    Review of Systems  Constitutional:  Negative for chills and fever.  Respiratory:  Negative for shortness of breath.   Gastrointestinal:  Negative for abdominal pain, blood in stool, constipation, diarrhea, nausea and vomiting.  Genitourinary:  Negative for dysuria, frequency, hematuria and urgency.  Skin:            Neurological:  Negative for headaches.       Objective:    Physical Exam Constitutional:      General: She is not in acute distress.    Appearance: Normal appearance. She is not ill-appearing.  HENT:     Head: Normocephalic and atraumatic.     Right Ear:  External ear normal.     Left Ear: External ear normal.     Nose: Nose normal.     Mouth/Throat:     Mouth: Mucous membranes are moist.     Pharynx: Oropharynx is clear.  Eyes:     General:        Right eye: No discharge.        Left eye: No discharge.     Extraocular Movements: Extraocular movements intact.     Conjunctiva/sclera: Conjunctivae normal.     Pupils: Pupils are equal, round, and reactive to light.  Cardiovascular:     Rate and Rhythm: Normal rate and regular rhythm.     Pulses: Normal pulses.     Heart sounds: Normal heart sounds. No murmur heard.    No gallop.  Pulmonary:     Effort: Pulmonary effort is normal. No respiratory distress.     Breath sounds: Normal breath sounds. No wheezing or rales.  Abdominal:     General: Bowel sounds are normal.     Palpations: Abdomen is soft.     Tenderness: There is no abdominal tenderness. There is no guarding.  Musculoskeletal:        General: Normal range of motion.     Cervical back: Normal range of motion.     Right lower leg: No edema.     Left lower leg: No edema.  Skin:    General: Skin is warm and dry.  Neurological:     Mental Status: She is alert and oriented to person, place, and time.  Psychiatric:        Mood and Affect: Mood normal.        Behavior: Behavior normal.        Judgment: Judgment normal.     BP 126/78 (BP Location: Right Arm, Patient Position: Sitting, Cuff Size: Normal)   Pulse 77   Temp (!) 97.5 F (36.4 C) (Oral)   Resp 16   Ht 5\' 4"  (1.626 m)   Wt 153 lb (69.4 kg)   SpO2 98%   BMI 26.26 kg/m  Wt Readings from Last 3 Encounters:  11/26/22 153 lb (69.4 kg)  10/04/22 155 lb (70.3 kg)  08/15/22 154 lb (69.9 kg)     Diabetic Foot Exam - Simple   No data filed    Lab Results  Component Value Date   WBC 5.1 05/28/2022   HGB 13.0 05/28/2022   HCT 38.9 05/28/2022   PLT 204.0 05/28/2022   GLUCOSE 90 05/28/2022   CHOL 144 05/28/2022   TRIG 123.0 05/28/2022   HDL 49.80 05/28/2022   LDLDIRECT 157.0 09/13/2019   LDLCALC 69 05/28/2022   ALT 18 05/28/2022   AST 21 05/28/2022   NA 140 05/28/2022   K 3.9 05/28/2022   CL 104 05/28/2022   CREATININE 0.88 05/28/2022   BUN 16 05/28/2022   CO2 29 05/28/2022   TSH 3.40 05/28/2022   HGBA1C 6.2 05/28/2022    Lab Results  Component Value Date   TSH 3.40 05/28/2022   Lab Results  Component Value Date   WBC 5.1 05/28/2022   HGB 13.0 05/28/2022   HCT 38.9 05/28/2022   MCV 89.1 05/28/2022   PLT 204.0 05/28/2022   Lab Results  Component Value Date   NA 140 05/28/2022   K 3.9 05/28/2022   CO2 29 05/28/2022   GLUCOSE 90 05/28/2022   BUN 16 05/28/2022   CREATININE 0.88 05/28/2022   BILITOT 0.4 05/28/2022   ALKPHOS 45  05/28/2022   AST 21 05/28/2022   ALT 18 05/28/2022   PROT 6.4 05/28/2022   ALBUMIN 4.1 05/28/2022   CALCIUM 9.2 05/28/2022   ANIONGAP 10 05/09/2017   GFR 62.24 05/28/2022   Lab Results  Component Value Date   CHOL 144 05/28/2022   Lab Results  Component Value Date   HDL 49.80 05/28/2022   Lab Results  Component Value Date   LDLCALC 69 05/28/2022   Lab Results  Component Value Date   TRIG 123.0 05/28/2022   Lab Results  Component Value Date   CHOLHDL 3 05/28/2022   Lab Results  Component Value Date   HGBA1C 6.2 05/28/2022      Assessment & Plan:  Hyperglycemia: This is well-controlled and patient's HGBA1C will be rechecked today.  Hyperlipidemia: This is well-controlled with Rosuvastatin 20 mg and continues to be monitored.  Hypertension: This is well-controlled with Hydrochlorothiazide 12.5 mg and Losartan 25 mg and continues to be monitored.   Immunizations: Encouraged patient to consider annual  COVID-19 and Influenza vaccinations as well as RSV vaccination.   Healthy Lifestyle: Encouraged 6-8 hours of sleep, heart healthy diet, 60-80 oz of non-alcohol/non-caffeinated fluids, and minimum of 4000 steps daily.  Labs: Routine blood work ordered today. Problem List Items Addressed This Visit     CKD (chronic kidney disease), stage III ([facility]) - Primary    Hydrate and monitor       Relevant Orders   Comprehensive metabolic panel   History of COVID-19    In February but has fully recovered      HLD (hyperlipidemia) (Chronic)    Tolerating statin, encouraged heart healthy diet, avoid trans fats, minimize simple carbs and saturated fats. Increase exercise as tolerated      Relevant Orders   Lipid panel   Hyperglycemia    hgba1c acceptable, minimize simple carbs. Increase exercise as tolerated.       Relevant Orders   Hemoglobin A1c   Hypertension (Chronic)    Well controlled, no changes to meds. Encouraged heart healthy diet such as the DASH diet and exercise as tolerated.        Relevant Orders   CBC with Differential/Platelet   TSH   No orders of the defined types were placed in this encounter.  I, Danise Edge, MD, personally preformed the services described in this documentation.  All medical record entries made by the scribe were at my direction and in my presence.  I have reviewed the chart and discharge instructions (if applicable) and agree that the record reflects my personal performance and is accurate and complete. 11/26/2022  I,Mohammed Iqbal,acting as a scribe for Danise Edge, MD.,have documented all relevant documentation on the behalf of Danise Edge, MD,as directed by  Danise Edge, MD while in the presence of Danise Edge, MD.  Danise Edge, MD

## 2022-11-26 NOTE — Patient Instructions (Signed)
Covid booster second half of may RSV, Respiratory Syncitial Virus Vaccine, Arexvy in August Covid and flu booster late September or early October

## 2022-11-26 NOTE — Assessment & Plan Note (Signed)
hgba1c acceptable, minimize simple carbs. Increase exercise as tolerated.  

## 2022-11-26 NOTE — Assessment & Plan Note (Signed)
Tolerating statin, encouraged heart healthy diet, avoid trans fats, minimize simple carbs and saturated fats. Increase exercise as tolerated 

## 2022-11-26 NOTE — Assessment & Plan Note (Signed)
Well controlled, no changes to meds. Encouraged heart healthy diet such as the DASH diet and exercise as tolerated.  °

## 2022-12-20 ENCOUNTER — Telehealth: Payer: Self-pay | Admitting: Cardiology

## 2022-12-20 NOTE — Telephone Encounter (Signed)
Patient stated she has been experiencing  chest pain off and on for 2 weeks. Last night pt had chest pain it last for "milli" second. Pt wanted to be seen with MD however, the next availably in the middle of July. Pt is scheduled with APP on 6/10, explained ED precautions patient voiced understanding. Will forward to APP for advise.

## 2022-12-20 NOTE — Telephone Encounter (Signed)
Patient is requesting a provider switch from Dr. Herbie Baltimore to Dr. Jens Som. She would like to be seen in the [facility] office with her husband's cardiologist.

## 2022-12-20 NOTE — Telephone Encounter (Signed)
Pt c/o of Chest Pain: STAT if CP now or developed within 24 hours  1. Are you having CP right now? No   2. Are you experiencing any other symptoms (ex. SOB, nausea, vomiting, sweating)? No, but has had to burp more than usual.     3. How long have you been experiencing CP? Woke up last night around 2 A.M. with pain in chest, but it did not last long and has not had issues since  4. Is your CP continuous or coming and going? Came and went.   5. Have you taken Nitroglycerin? No  ?

## 2022-12-22 NOTE — Telephone Encounter (Signed)
Makes sense to me 

## 2022-12-30 DIAGNOSIS — L821 Other seborrheic keratosis: Secondary | ICD-10-CM | POA: Diagnosis not present

## 2022-12-30 DIAGNOSIS — D485 Neoplasm of uncertain behavior of skin: Secondary | ICD-10-CM | POA: Diagnosis not present

## 2022-12-30 DIAGNOSIS — Z129 Encounter for screening for malignant neoplasm, site unspecified: Secondary | ICD-10-CM | POA: Diagnosis not present

## 2022-12-30 DIAGNOSIS — D1801 Hemangioma of skin and subcutaneous tissue: Secondary | ICD-10-CM | POA: Diagnosis not present

## 2022-12-30 DIAGNOSIS — L820 Inflamed seborrheic keratosis: Secondary | ICD-10-CM | POA: Diagnosis not present

## 2023-01-05 NOTE — Progress Notes (Unsigned)
Cardiology Clinic Note   Patient Name: Carleene Cooper Date of Encounter: 01/05/2023  Primary Care Provider:  Bradd Canary, MD Primary Cardiologist:  Bryan Lemma, MD  Patient Profile    Carleene Cooper 82 year old female presents to the clinic today for an evaluation of her chest discomfort.  Past Medical History    Past Medical History:  Diagnosis Date   Adenomatous colon polyp 03/2012   Cancer (HCC)    basal on back and nose   Cataract    Chicken pox as a child   CKD (chronic kidney disease), stage III (HCC) 02/21/2017   DDD (degenerative disc disease), cervical    EBV infection    mono as child   Fibrocystic breast 05/01/2014   GERD (gastroesophageal reflux disease)    pt denies   Hyperlipidemia    Hypertension 20 yrs ago   Measles as a child   Medicare annual wellness visit, subsequent 02/01/2015   Mumps as a child   Neck pain 01/22/2016   Other and unspecified hyperlipidemia 10/13/2013   Overweight(278.02)    PONV (postoperative nausea and vomiting)    pt would like something to prevent nausea   Vasovagal reaction 07/15/2015   no syncope but close    Past Surgical History:  Procedure Laterality Date   ABDOMINAL HYSTERECTOMY  29 yrs ago   heavy bleeding, ovaries left in place   ANTERIOR CERVICAL DECOMP/DISCECTOMY FUSION N/A 05/15/2017   Procedure: ANTERIOR CERVICAL DECOMPRESSION/DISCECTOMY FUSION CERVICL THREE CERVICAL FOUR, CERVICAL FOUR- CERVICAL FIVE;  Surgeon: Coletta Memos, MD;  Location: [facility] OR;  Service: Neurosurgery;  Laterality: N/A;   CATARACT EXTRACTION, BILATERAL Bilateral 2012   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  Exercise 4:30 min (4.6 METS)-she reached peak heart rate 146 bpm which is 96% max protected.  No EKG changes.  Normal wall motion. No evidence of ischemia or infarction.    POLYPECTOMY     TRANSTHORACIC ECHOCARDIOGRAM  01/2017     EF 60 to 65%.  GR 1 DD.  Normal wall motion.  Normal atrial sizes.  Mildly dilated IVC.   VAGINAL HYSTERECTOMY  82 yrs old   uterus removed    Allergies  Allergies  Allergen Reactions   Omnicef [Cefdinir] Nausea And Vomiting   Dust Mite Extract    Lisinopril Other (See Comments)    dizzy   Statins Other (See Comments)    Muscle weakness - atorvastatin     Sulfa Antibiotics Other (See Comments)    "feels feverish"   Tetracyclines & Related Other (See Comments)    GI upset   Benadryl [Diphenhydramine] Other (See Comments)    Urinary incontinence stopped when med discontinued    History of Present Illness    SOUMYA COLSON has a PMH of PVCs, essential hypertension, mixed hyperlipidemia, and intercostal pain.  Cardiac event monitor 2021 showed rare isolated PACs and PVCs with 2 short bursts of SVT 5-9 beats.  Coronary calcium scoring showed a coronary calcium score of 133 with three-vessel coronary artery disease.  Recommendation was to repeat CT in 1 year.  Follow-up CT 2022 showed small stable pulmonary nodule.  She was last contacted by Dr. Herbie Baltimore via virtual visit on 02/07/2020.  During that time she reported that she was not having any chest discomfort.  Her blood pressure was well-controlled.  She  was compliant with losartan.  She was also taking HCTZ.  Her palpitations were well-controlled.  She reported a couple of episodes of palpitations but denied prolonged episodes.  She felt that her episodes have been triggered by diet Coke that was consumed later in the day.  She was trying to avoid triggers.  She reported that she was concerned about being diagnosed with prediabetes by her PCP.  She was not having any significant cardiac symptoms.  She presents to the clinic today for follow-up evaluation and states***.  *** denies chest pain, shortness of breath, lower extremity edema, fatigue, palpitations, melena, hematuria, hemoptysis, diaphoresis, weakness, presyncope, syncope,  orthopnea, and PND.  Atypical chest pain-no chest pain today.  Reports episode of chest discomfort around 12/20/2022 where she woke up with discomfort that was not prolonged.  Denies nitroglycerin use.  Denies exertional chest discomfort No plans for ischemic evaluation at this time.  Essential hypertension-BP today***. Maintain blood pressure log Continue losartan, HCTZ  Palpitations-denies recent episodes of accelerated or irregular heartbeat.  Previously wore cardiac event monitor which showed rare isolated PACs and PVCs as well as short bursts of SVT. Avoid triggers caffeine, chocolate, EtOH, dehydration etc.  Coronary artery disease-calcium scoring in 2021 showed a score of 133 with nonobstructive three-vessel coronary disease. Continue heart healthy low-sodium high-fiber diet Continue rosuvastatin  Hyperlipidemia-LDL***. Continue current medical therapy Increase physical activity as tolerated High-fiber diet  Disposition: Follow-up with Dr. Herbie Baltimore in 12 months.  Home Medications    Prior to Admission medications   Medication Sig Start Date End Date Taking? Authorizing Provider  acetaminophen (TYLENOL) 500 MG tablet Take 500 mg by mouth 2 (two) times daily as needed for mild pain.     [provider]  fluticasone (FLONASE) 50 MCG/ACT nasal spray Place 2 sprays into both nostrils daily. 08/15/22   Olive Bass, FNP  hydrochlorothiazide (HYDRODIURIL) 12.5 MG tablet TAKE 1 TABLET BY MOUTH EVERY DAY IN THE MORNING 06/17/22   Bradd Canary, MD  losartan (COZAAR) 25 MG tablet TAKE 1 TABLET (25 MG TOTAL) BY MOUTH DAILY. 11/05/22   Bradd Canary, MD  Multiple Vitamin (MULTIVITAMIN) tablet Take 1 tablet by mouth daily.    [provider]  naproxen (NAPROSYN) 500 MG tablet Take 500 mg by mouth daily as needed.  11/11/16   [provider]  rosuvastatin (CRESTOR) 20 MG tablet Take 1 tablet (20 mg total) by mouth daily. 06/19/22   Bradd Canary, MD     Family History    Family History  Problem Relation Age of Onset   Heart disease Father    Heart attack Father 79   Hypertension Father    Dementia Maternal Grandmother    Heart disease Maternal Grandfather    Hyperlipidemia Paternal Grandmother    Cancer Paternal Grandfather        stomach   Heart attack Paternal Grandfather    Stomach cancer Paternal Grandfather    Dementia Mother    Pulmonary embolism Son    Vasculitis Son    Other Son        vasculitis led to PE   Colon cancer Neg Hx    Colon polyps Neg Hx    Rectal cancer Neg Hx    She indicated that her mother is deceased. She indicated that her father is deceased. She indicated that her brother is alive. She indicated that her maternal grandmother is deceased. She indicated that her maternal grandfather is deceased. She indicated that her paternal  grandmother is deceased. She indicated that her paternal grandfather is deceased. She indicated that her daughter is alive. She indicated that her son is alive. She indicated that the status of her neg hx is unknown.  Social History    Social History   Socioeconomic History   Marital status: Married    Spouse name: Not on file   Number of children: Not on file   Years of education: Not on file   Highest education level: Not on file  Occupational History   Not on file  Tobacco Use   Smoking status: Former    Packs/day: 1.00    Years: 16.00    Additional pack years: 0.00    Total pack years: 16.00    Types: Cigarettes    Start date: 07/29/1972   Smokeless tobacco: Never  Substance and Sexual Activity   Alcohol use: No    Alcohol/week: 0.0 standard drinks of alcohol   Drug use: No   Sexual activity: Never    Comment: lives with husband no major dietary restrictions  Other Topics Concern   Not on file  Social History Narrative   Not on file   Social Determinants of Health   Financial Resource Strain: Not on file  Food Insecurity: Not on file  Transportation  Needs: Not on file  Physical Activity: Not on file  Stress: Not on file  Social Connections: Not on file  Intimate Partner Violence: Not on file     Review of Systems    General:  No chills, fever, night sweats or weight changes.  Cardiovascular:  No chest pain, dyspnea on exertion, edema, orthopnea, palpitations, paroxysmal nocturnal dyspnea. Dermatological: No rash, lesions/masses Respiratory: No cough, dyspnea Urologic: No hematuria, dysuria Abdominal:   No nausea, vomiting, diarrhea, bright red blood per rectum, melena, or hematemesis Neurologic:  No visual changes, wkns, changes in mental status. All other systems reviewed and are otherwise negative except as noted above.  Physical Exam    VS:  There were no vitals taken for this visit. , BMI There is no height or weight on file to calculate BMI. GEN: Well nourished, well developed, in no acute distress. HEENT: normal. Neck: Supple, no JVD, carotid bruits, or masses. Cardiac: RRR, no murmurs, rubs, or gallops. No clubbing, cyanosis, edema.  Radials/DP/PT 2+ and equal bilaterally.  Respiratory:  Respirations regular and unlabored, clear to auscultation bilaterally. GI: Soft, nontender, nondistended, BS + x 4. MS: no deformity or atrophy. Skin: warm and dry, no rash. Neuro:  Strength and sensation are intact. Psych: Normal affect.  Accessory Clinical Findings    Recent Labs: 11/26/2022: ALT 12; BUN 18; Creatinine, Ser 0.93; Hemoglobin 13.6; Platelets 225.0; Potassium 3.8; Sodium 140; TSH 3.49   Recent Lipid Panel    Component Value Date/Time   CHOL 162 11/26/2022 1015   TRIG 132.0 11/26/2022 1015   HDL 56.00 11/26/2022 1015   CHOLHDL 3 11/26/2022 1015   VLDL 26.4 11/26/2022 1015   LDLCALC 80 11/26/2022 1015   LDLCALC 78 05/22/2020 1428   LDLDIRECT 157.0 09/13/2019 1029    No BP recorded.  {Refresh Note OR Click here to enter BP  :1}***    ECG personally reviewed by me today- *** - No acute changes   Coronary  calcium scoring 03/22/2020  ADDENDUM REPORT: 03/22/2020 20:53   CLINICAL DATA:  Risk stratification   EXAM: Coronary Calcium Score   TECHNIQUE: The patient was scanned on a [employer] scanner. Axial non-contrast 3 mm slices were carried  out through the heart. The data set was analyzed on a dedicated work station and scored using the Agatson method.   FINDINGS: Non-cardiac: See separate report from [facility], with specific finding of RIGHT middle lobe lung nodule.   Aorta: Normal caliber.  Aortic atherosclerosis.   Pericardium: Normal   Coronary arteries: Normal origins.  3 vessel coronary calcification.   Mitral annular calcification.   IMPRESSION: Coronary calcium score of 133. This was 61st percentile for age and sex matched control.     Electronically Signed   By: Chrystie Nose M.D.   On: 03/22/2020 20:53     Assessment & Plan   1.  ***   Thomasene Ripple. Tishie Altmann NP-C     01/05/2023, 9:19 AM [facility] [address] Office [phone] Fax [phone]    I spent***minutes examining this patient, reviewing medications, and using patient centered shared decision making involving her cardiac care.  Prior to her visit I spent greater than 20 minutes reviewing her past medical history,  medications, and prior cardiac tests.

## 2023-01-06 ENCOUNTER — Ambulatory Visit: Payer: Medicare Other | Attending: General Practice | Admitting: General Practice

## 2023-01-06 ENCOUNTER — Encounter: Payer: Self-pay | Admitting: General Practice

## 2023-01-06 VITALS — BP 126/74 | HR 76 | Wt 155.0 lb

## 2023-01-06 DIAGNOSIS — E782 Mixed hyperlipidemia: Secondary | ICD-10-CM | POA: Diagnosis not present

## 2023-01-06 DIAGNOSIS — R002 Palpitations: Secondary | ICD-10-CM | POA: Diagnosis not present

## 2023-01-06 DIAGNOSIS — I2510 Atherosclerotic heart disease of native coronary artery without angina pectoris: Secondary | ICD-10-CM | POA: Insufficient documentation

## 2023-01-06 DIAGNOSIS — I10 Essential (primary) hypertension: Secondary | ICD-10-CM | POA: Insufficient documentation

## 2023-01-06 DIAGNOSIS — R072 Precordial pain: Secondary | ICD-10-CM | POA: Diagnosis not present

## 2023-01-06 NOTE — Patient Instructions (Addendum)
Medication Instructions:  The current medical regimen is effective;  continue present plan and medications as directed. Please refer to the Current Medication list given to you today.  *If you need a refill on your cardiac medications before your next appointment, please call your pharmacy*  Lab Work: NONE If you have labs (blood work) drawn today and your tests are completely normal, you will receive your results only by: MyChart Message (if you have MyChart) OR A paper copy in the mail If you have any lab test that is abnormal or we need to change your treatment, we will call you to review the results.  Testing/Procedures: NONE  Follow-Up: At [facility], you and your health needs are our priority.  As part of our continuing mission to provide you with exceptional heart care, we have created designated Provider Care Teams.  These Care Teams include your primary Cardiologist (physician) and Advanced Practice Providers (APPs -  Physician Assistants and Nurse Practitioners) who all work together to provide you with the care you need, when you need it.  Your next appointment:   9-12 month(s)  Provider:   Olga Millers, MD    Other Instructions INCREASE PHYSICAL ACTIVITY-AS TOLERATED START WALKING START WITH A FEW MINUTES DAILY  High-Fiber Eating Plan Fiber, also called dietary fiber, is a type of carbohydrate. It is found foods such as fruits, vegetables, whole grains, and beans. A high-fiber diet can have many health benefits. Your health care provider may recommend a high-fiber diet to help: Prevent constipation. Fiber can make your bowel movements more regular. Lower your cholesterol. Relieve the following conditions: Inflammation of veins in the anus (hemorrhoids). Inflammation of specific areas of the digestive tract (uncomplicated diverticulosis). A problem of the large intestine, also called the colon, that sometimes causes pain and diarrhea (irritable bowel syndrome,  or IBS). Prevent overeating as part of a weight-loss plan. Prevent heart disease, type 2 diabetes, and certain cancers. What are tips for following this plan? Reading food labels  Check the nutrition facts label on food products for the amount of dietary fiber. Choose foods that have 5 grams of fiber or more per serving. The goals for recommended daily fiber intake include: Men (age 25 or younger): 34-38 g. Men (over age 52): 28-34 g. Women (age 70 or younger): 25-28 g. Women (over age 98): 22-25 g. Shopping Choose whole fruits and vegetables instead of processed forms, such as apple juice or applesauce. Choose a wide variety of high-fiber foods such as avocados, lentils, oats, and kidney beans. Read the nutrition facts label of the foods you choose. Be aware of foods with added fiber. These foods often have high sugar and sodium amounts per serving. Cooking Use whole-grain flour for baking and cooking. Cook with brown rice instead of white rice. Meal planning Start the day with a breakfast that is high in fiber, such as a cereal that contains 5 g of fiber or more per serving. Eat breads and cereals that are made with whole-grain flour instead of refined flour or white flour. Eat brown rice, bulgur wheat, or millet instead of white rice. Use beans in place of meat in soups, salads, and pasta dishes. Be sure that half of the grains you eat each day are whole grains. General information You can get the recommended daily intake of dietary fiber by: Eating a variety of fruits, vegetables, grains, nuts, and beans. Taking a fiber supplement if you are not able to take in enough fiber in your diet.  It is better to get fiber through food than from a supplement. Gradually increase how much fiber you consume. If you increase your intake of dietary fiber too quickly, you may have bloating, cramping, or gas. Drink plenty of water to help you digest fiber. Choose high-fiber snacks, such as berries,  raw vegetables, nuts, and popcorn. What foods should I eat? Fruits Berries. Pears. Apples. Oranges. Avocado. Prunes and raisins. Dried figs. Vegetables Sweet potatoes. Spinach. Kale. Artichokes. Cabbage. Broccoli. Cauliflower. Green peas. Carrots. Squash. Grains Whole-grain breads. Multigrain cereal. Oats and oatmeal. Brown rice. Barley. Bulgur wheat. Millet. Quinoa. Bran muffins. Popcorn. Rye wafer crackers. Meats and other proteins Navy beans, kidney beans, and pinto beans. Soybeans. Split peas. Lentils. Nuts and seeds. Dairy Fiber-fortified yogurt. Beverages Fiber-fortified soy milk. Fiber-fortified orange juice. Other foods Fiber bars. The items listed above may not be a complete list of recommended foods and beverages. Contact a dietitian for more information. What foods should I avoid? Fruits Fruit juice. Cooked, strained fruit. Vegetables Fried potatoes. Canned vegetables. Well-cooked vegetables. Grains White bread. Pasta made with refined flour. White rice. Meats and other proteins Fatty cuts of meat. Fried chicken or fried fish. Dairy Milk. Yogurt. Cream cheese. Sour cream. Fats and oils Butters. Beverages Soft drinks. Other foods Cakes and pastries. The items listed above may not be a complete list of foods and beverages to avoid. Talk with your dietitian about what choices are best for you. Summary Fiber is a type of carbohydrate. It is found in foods such as fruits, vegetables, whole grains, and beans. A high-fiber diet has many benefits. It can help to prevent constipation, lower blood cholesterol, aid weight loss, and reduce your risk of heart disease, diabetes, and certain cancers. Increase your intake of fiber gradually. Increasing fiber too quickly may cause cramping, bloating, and gas. Drink plenty of water while you increase the amount of fiber you consume. The best sources of fiber include whole fruits and vegetables, whole grains, nuts, seeds, and  beans. This information is not intended to replace advice given to you by your health care provider. Make sure you discuss any questions you have with your health care provider. Document Revised: 11/18/2019 Document Reviewed: 11/18/2019 Elsevier Patient Education  2024 [employer].

## 2023-02-01 ENCOUNTER — Other Ambulatory Visit: Payer: Self-pay | Admitting: Family Medicine

## 2023-04-04 ENCOUNTER — Encounter: Payer: Self-pay | Admitting: Physician Assistant

## 2023-04-04 ENCOUNTER — Other Ambulatory Visit (HOSPITAL_BASED_OUTPATIENT_CLINIC_OR_DEPARTMENT_OTHER): Payer: Self-pay

## 2023-04-04 ENCOUNTER — Ambulatory Visit (INDEPENDENT_AMBULATORY_CARE_PROVIDER_SITE_OTHER): Payer: Medicare Other | Admitting: Physician Assistant

## 2023-04-04 VITALS — BP 128/66 | HR 84 | Temp 98.2°F | Resp 20

## 2023-04-04 DIAGNOSIS — J209 Acute bronchitis, unspecified: Secondary | ICD-10-CM | POA: Diagnosis not present

## 2023-04-04 MED ORDER — AMOXICILLIN 875 MG PO TABS
875.0000 mg | ORAL_TABLET | Freq: Two times a day (BID) | ORAL | 0 refills | Status: AC
  Filled 2023-04-04: qty 14, 7d supply, fill #0

## 2023-04-04 NOTE — Progress Notes (Signed)
Established patient visit   Patient: Laura [name]   DOB: April 22, 1941   82 y.o. Female  MRN: [id number] Visit Date: 04/04/2023  Today's healthcare provider: Alfredia Ferguson, PA-C   Chief Complaint  Patient presents with   Bronchitis    Patient states symptoms started last week with sore throat then cough with chest tightness and tested negative for Covid. She felt better on Tuesday and symptoms started back on Wednesday and thinks it Bronchitis.   Subjective    HPI  Pt reports sore throat, chest tightness, cough x 1 week, reports negative COVID test at home.Reports headaches/migraines, nasal congestion, postnasal drip. Denies fever.   Medications: Outpatient Medications Prior to Visit  Medication Sig   acetaminophen (TYLENOL) 500 MG tablet Take 500 mg by mouth 2 (two) times daily as needed for mild pain.    fluticasone (FLONASE) 50 MCG/ACT nasal spray Place 2 sprays into both nostrils daily.   hydrochlorothiazide (HYDRODIURIL) 12.5 MG tablet TAKE 1 TABLET BY MOUTH EVERY DAY IN THE MORNING   losartan (COZAAR) 25 MG tablet TAKE 1 TABLET (25 MG TOTAL) BY MOUTH DAILY.   Multiple Vitamin (MULTIVITAMIN) tablet Take 1 tablet by mouth daily.   naproxen (NAPROSYN) 500 MG tablet Take 500 mg by mouth daily as needed.   rosuvastatin (CRESTOR) 20 MG tablet TAKE 1 TABLET BY MOUTH EVERY DAY   No facility-administered medications prior to visit.    Review of Systems  Constitutional:  Positive for fatigue. Negative for fever.  HENT:  Positive for postnasal drip, rhinorrhea and sore throat.   Respiratory:  Positive for cough. Negative for shortness of breath.   Cardiovascular:  Negative for chest pain and leg swelling.  Gastrointestinal:  Negative for abdominal pain.  Neurological:  Positive for headaches. Negative for dizziness.      Objective    BP 128/66 (BP Location: Left Arm, Patient Position: Sitting, Cuff Size: Normal)   Pulse 84   Temp 98.2 F (36.8 C) (Oral)   Resp 20    SpO2 (!) 84%   Physical Exam Constitutional:      General: She is awake.     Appearance: She is well-developed.  HENT:     Head: Normocephalic.  Eyes:     Conjunctiva/sclera: Conjunctivae normal.  Cardiovascular:     Rate and Rhythm: Normal rate and regular rhythm.     Heart sounds: Normal heart sounds.  Pulmonary:     Effort: Pulmonary effort is normal.     Breath sounds: Normal breath sounds. No wheezing, rhonchi or rales.  Skin:    General: Skin is warm.  Neurological:     Mental Status: She is alert and oriented to person, place, and time.  Psychiatric:        Attention and Perception: Attention normal.        Mood and Affect: Mood normal.        Speech: Speech normal.        Behavior: Behavior is cooperative.      No results found for any visits on 04/04/23.  Assessment & Plan     1. Acute bronchitis/sinusitis, unspecified organism Hydrate, rest, mucinex otc  Rx amoxicillin bid x 7 days  - amoxicillin (AMOXIL) 875 MG tablet; Take 1 tablet (875 mg total) by mouth 2 (two) times daily for 7 days.  Dispense: 14 tablet; Refill: 0   Return if symptoms worsen or fail to improve.      I, Alfredia Ferguson, PA-C have reviewed all  documentation for this visit. The documentation on  04/04/23   for the exam, diagnosis, procedures, and orders are all accurate and complete.    Alfredia Ferguson, PA-C  [facility] at [facility] [phone] (phone) [phone] (fax)  [facility]

## 2023-04-29 DIAGNOSIS — Z23 Encounter for immunization: Secondary | ICD-10-CM | POA: Diagnosis not present

## 2023-05-16 ENCOUNTER — Other Ambulatory Visit (HOSPITAL_BASED_OUTPATIENT_CLINIC_OR_DEPARTMENT_OTHER): Payer: Self-pay | Admitting: Family Medicine

## 2023-05-16 DIAGNOSIS — Z139 Encounter for screening, unspecified: Secondary | ICD-10-CM

## 2023-06-02 ENCOUNTER — Ambulatory Visit: Payer: Medicare Other | Admitting: Physician Assistant

## 2023-06-02 ENCOUNTER — Ambulatory Visit: Payer: Medicare Other | Admitting: Family Medicine

## 2023-06-17 ENCOUNTER — Other Ambulatory Visit: Payer: Self-pay | Admitting: Family Medicine

## 2023-06-20 DIAGNOSIS — G245 Blepharospasm: Secondary | ICD-10-CM | POA: Diagnosis not present

## 2023-06-20 DIAGNOSIS — H35373 Puckering of macula, bilateral: Secondary | ICD-10-CM | POA: Diagnosis not present

## 2023-06-20 DIAGNOSIS — Z961 Presence of intraocular lens: Secondary | ICD-10-CM | POA: Diagnosis not present

## 2023-07-17 ENCOUNTER — Ambulatory Visit: Payer: Self-pay | Admitting: Family Medicine

## 2023-07-17 ENCOUNTER — Ambulatory Visit
Admission: EM | Admit: 2023-07-17 | Discharge: 2023-07-17 | Disposition: A | Discharge status: Home / Self Care | Payer: Medicare Other | Attending: Internal Medicine | Admitting: Internal Medicine

## 2023-07-17 DIAGNOSIS — W57XXXA Bitten or stung by nonvenomous insect and other nonvenomous arthropods, initial encounter: Secondary | ICD-10-CM | POA: Diagnosis not present

## 2023-07-17 DIAGNOSIS — S40862A Insect bite (nonvenomous) of left upper arm, initial encounter: Secondary | ICD-10-CM | POA: Diagnosis not present

## 2023-07-17 MED ORDER — CEPHALEXIN 500 MG PO CAPS: 500.0000 mg | ORAL_CAPSULE | Freq: Three times a day (TID) | ORAL | 0 refills | Status: AC

## 2023-07-17 NOTE — Telephone Encounter (Signed)
Pt going to [place]

## 2023-07-17 NOTE — Telephone Encounter (Signed)
  Chief Complaint: been bitten by something Symptoms: swelling, itching, redness, warmth, knot Frequency: constant Pertinent Negatives: Patient denies fever, trouble breathing Disposition: [] ED /[] Urgent Care (no appt availability in office) / [] Appointment(In office/virtual)/ []  [facility]/ [] Home Care/ [] Refused Recommended Disposition /[]  Mobile Bus/ []  Follow-up with PCP Additional Notes: Patient stated she wanted to be seen today for potential bite of some sort. No appointments available but directed her to the nearest Urgent Care. Advised patient to draw a circle around the current swollen area since she felt it was spreading, so she could monitor if the redness and swelling was spreading. Also advised the patient to apply antibiotic ointment and a cold compress on the affected area. Patient states she is going now to urgent care.   Copied from CRM [id number]. Topic: Clinical - Red Word Triage >> Jul 17, 2023 12:03 PM Laura [name] wrote: Red Word that prompted transfer to Nurse Triage: Patient stated she has gotten bit by something and its really red and swollen Reason for Disposition  [1] Red or very tender (to touch) area AND [2] started over 24 hours after the bite  Answer Assessment - Initial Assessment Questions 1. TYPE of INSECT: "What type of insect was it?"      Unknown, not for sure it was an insect but knows it was not a human bite and "does not see two holes so does not suspect it was a spider bite" 2. ONSET: "When did you get bitten?"       Yesterday  3. LOCATION: "Where is the insect bite located?"      Upper left arm 4. REDNESS: "Is the area red or pink?" If Yes, ask: "What size is area of redness?" (inches or cm). "When did the redness start?"     Really red, swollen knot, extremely itchy, red 5. PAIN: "Is there any pain?" If Yes, ask: "How bad is it?"  (Scale 1-10; or mild, moderate, severe)     Not painful, just very itchy 6. ITCHING: "Does it itch?"  If Yes, ask: "How bad is the itch?"    - MILD: doesn't interfere with normal activities   - MODERATE-SEVERE: interferes with work, school, sleep, or other activities      Medium to severe 7. SWELLING: "How big is the swelling?" (inches, cm, or compare to coins)     Round and seems to be getting larger, currently the size of a silver dollar 8. OTHER SYMPTOMS: "Do you have any other symptoms?"  (e.g., difficulty breathing, hives)     No other symptoms  Protocols used: Insect Bite-A-AH

## 2023-07-17 NOTE — ED Triage Notes (Signed)
Pt presents to UC for c/o left arm redness since yesterday. She is unsure if it is poison ivy or an insect bite. Pt reports pruritus at the site.  She has used triamcinolone cream and antibiotic cream w/o relief.

## 2023-07-17 NOTE — ED Provider Notes (Signed)
[facility]    CSN: [id number] Arrival date & time: 07/17/23  1310      History   Chief Complaint No chief complaint on file.   HPI Laura [name] is a 82 y.o. female presents for a insect bite.  States yesterday she noticed a bite to the inner aspect of her left arm.  Is unsure what she was bitten by.  States area is itchy and irritated.  States she has been scratching a lot and the redness and warmth to the area as it is grown.  No drainage, fevers or chills.  No history of MRSA.  She has been using topical triamcinolone and over-the-counter antibiotic cream.  No other concerns at this time.  HPI  Past Medical History:  Diagnosis Date   Adenomatous colon polyp 03/2012   Cancer (HCC)    basal on back and nose   Cataract    Chicken pox as a child   CKD (chronic kidney disease), stage III (HCC) 02/21/2017   DDD (degenerative disc disease), cervical    EBV infection    mono as child   Fibrocystic breast 05/01/2014   GERD (gastroesophageal reflux disease)    pt denies   Hyperlipidemia    Hypertension 20 yrs ago   Measles as a child   Medicare annual wellness visit, subsequent 02/01/2015   Mumps as a child   Neck pain 01/22/2016   Other and unspecified hyperlipidemia 10/13/2013   Overweight(278.02)    PONV (postoperative nausea and vomiting)    pt would like something to prevent nausea   Vasovagal reaction 07/15/2015   no syncope but close     Patient Active Problem List   Diagnosis Date Noted   History of COVID-19 11/26/2022   Congestion of throat 10/04/2022   Arthritis of left hand 11/08/2021   Diarrhea 07/10/2021   Bronchitis 06/29/2021   Impacted cerumen of right ear 04/27/2021   Left hip pain 05/22/2020   Hyperglycemia 05/22/2020   Premature ventricular contractions (PVCs) (VPCs) 09/14/2019   Nonspecific abnormal electrocardiogram (ECG) (EKG) 09/14/2019   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/13/2019   Educated about COVID-19 virus infection 12/10/2018    Occipital neuralgia 08/07/2017   Constipation 08/07/2017   Spondylosis of cervical joint 05/15/2017   Left foot pain 05/01/2017   Obesity (BMI 30-39.9) 02/21/2017   CKD (chronic kidney disease), stage III (HCC) 02/21/2017   Chest pain 02/20/2017   Hypertension 02/20/2017   HLD (hyperlipidemia) 02/20/2017   Neck pain 01/22/2016   History of colonic polyps 07/15/2015   Preventative health care 02/01/2015   Fibrocystic breast 05/01/2014   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   Decreased visual acuity 01/23/2014   Sun-damaged skin 01/23/2014   Recurrent BCC (basal cell carcinoma) 10/13/2013   Cancer (HCC)    Cataract     Past Surgical History:  Procedure Laterality Date   ABDOMINAL HYSTERECTOMY  29 yrs ago   heavy bleeding, ovaries left in place   ANTERIOR CERVICAL DECOMP/DISCECTOMY FUSION N/A 05/15/2017   Procedure: ANTERIOR CERVICAL DECOMPRESSION/DISCECTOMY FUSION CERVICL THREE CERVICAL FOUR, CERVICAL FOUR- CERVICAL FIVE;  Surgeon: Coletta Memos, MD;  Location: [facility] OR;  Service: Neurosurgery;  Laterality: N/A;   CATARACT EXTRACTION, BILATERAL Bilateral 2012   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  Exercise 4:30 min (4.6 METS)-she reached peak heart rate 146 bpm which is 96% max  protected.  No EKG changes.  Normal wall motion. No evidence of ischemia or infarction.    POLYPECTOMY     TRANSTHORACIC ECHOCARDIOGRAM  01/2017    EF 60 to 65%.  GR 1 DD.  Normal wall motion.  Normal atrial sizes.  Mildly dilated IVC.   VAGINAL HYSTERECTOMY  82 yrs old   uterus removed    OB History   No obstetric history on file.      Home Medications    Prior to Admission medications   Medication Sig Start Date End Date Taking? Authorizing Provider  cephALEXin (KEFLEX) 500 MG capsule Take 1 capsule (500 mg total) by mouth 3 (three) times daily for 5 days. 07/17/23 07/22/23 Yes Radford Pax, NP   acetaminophen (TYLENOL) 500 MG tablet Take 500 mg by mouth 2 (two) times daily as needed for mild pain.     [provider]  fluticasone (FLONASE) 50 MCG/ACT nasal spray Place 2 sprays into both nostrils daily. 08/15/22   Olive Bass, FNP  hydrochlorothiazide (HYDRODIURIL) 12.5 MG tablet TAKE 1 TABLET BY MOUTH EVERY DAY IN THE MORNING 02/03/23   Bradd Canary, MD  losartan (COZAAR) 25 MG tablet Take 1 tablet (25 mg total) by mouth daily. 06/17/23   Bradd Canary, MD  Multiple Vitamin (MULTIVITAMIN) tablet Take 1 tablet by mouth daily.    [provider]  naproxen (NAPROSYN) 500 MG tablet Take 500 mg by mouth daily as needed. 11/11/16   [provider]  rosuvastatin (CRESTOR) 20 MG tablet TAKE 1 TABLET BY MOUTH EVERY DAY 02/03/23   Bradd Canary, MD    Family History Family History  Problem Relation Age of Onset   Heart disease Father    Heart attack Father 47   Hypertension Father    Dementia Maternal Grandmother    Heart disease Maternal Grandfather    Hyperlipidemia Paternal Grandmother    Cancer Paternal Grandfather        stomach   Heart attack Paternal Grandfather    Stomach cancer Paternal Grandfather    Dementia Mother    Pulmonary embolism Son    Vasculitis Son    Other Son        vasculitis led to PE   Colon cancer Neg Hx    Colon polyps Neg Hx    Rectal cancer Neg Hx     Social History Social History   Tobacco Use   Smoking status: Former    Current packs/day: 1.00    Average packs/day: 1 pack/day for 51.0 years (51.0 ttl pk-yrs)    Types: Cigarettes    Start date: 07/29/1972   Smokeless tobacco: Never  Substance Use Topics   Alcohol use: No    Alcohol/week: 0.0 standard drinks of alcohol   Drug use: No     Allergies   Omnicef [cefdinir], Dust mite extract, Lisinopril, Statins, Sulfa antibiotics, Tetracyclines & related, and Benadryl [diphenhydramine]   Review of Systems Review of Systems  Skin:        Insect bite  of arm     Physical Exam Triage Vital Signs ED Triage Vitals  Encounter Vitals Group     BP 07/17/23 1340 (!) 166/78     Systolic BP Percentile --      Diastolic BP Percentile --      Pulse Rate 07/17/23 1340 72     Resp 07/17/23 1340 16     Temp 07/17/23 1340 97.9 F (36.6 C)     Temp  Source 07/17/23 1340 Oral     SpO2 07/17/23 1340 98 %     Weight --      Height --      Head Circumference --      Peak Flow --      Pain Score 07/17/23 1337 0     Pain Loc --      Pain Education --      Exclude from Growth Chart --    No data found.  Updated Vital Signs BP (!) 166/78 (BP Location: Right Arm)   Pulse 72   Temp 97.9 F (36.6 C) (Oral)   Resp 16   SpO2 98%   Visual Acuity Right Eye Distance:   Left Eye Distance:   Bilateral Distance:    Right Eye Near:   Left Eye Near:    Bilateral Near:     Physical Exam Vitals and nursing note reviewed.  Constitutional:      General: She is not in acute distress.    Appearance: Normal appearance. She is not ill-appearing.  HENT:     Head: Normocephalic and atraumatic.  Eyes:     Pupils: Pupils are equal, round, and reactive to light.  Cardiovascular:     Rate and Rhythm: Normal rate.  Pulmonary:     Effort: Pulmonary effort is normal.  Skin:    General: Skin is warm and dry.          Comments: There is a single insect bite to the left arm adjacent to elbow.  Mild erythema and warmth surrounding.  No active drainage.  No induration or fluctuance.  Neurological:     General: No focal deficit present.     Mental Status: She is alert and oriented to person, place, and time.  Psychiatric:        Mood and Affect: Mood normal.        Behavior: Behavior normal.      UC Treatments / Results  Labs (all labs ordered are listed, but only abnormal results are displayed) Labs Reviewed - No data to display  Comprehensive metabolic panel Order: [id number]  Status: Final result     Next appt: 08/28/2023 at 02:40 PM in  Family Medicine Danise Edge, MD)     Dx: Stage 3 chronic kidney disease, unspe...   Test Result Released: Yes (seen)     Messages: Seen   2 Result Notes     1 Patient Communication     1 HM Topic          Component Ref Range & Units (hover) 7 mo ago (11/26/22) 1 yr ago (05/28/22) 1 yr ago (11/08/21) 2 yr ago (05/14/21) 2 yr ago (11/21/20) 3 yr ago (05/22/20) 3 yr ago (12/13/19)  Sodium 140 140 139 140 139 142 R 140  Potassium 3.8 3.9 4.0 3.9 4.2 4.3 R 4.1  Chloride 103 104 103 103 101 105 R 103  CO2 29 29 29 30 29 27  R 30  Glucose, Bld 87 90 86 82 87 84 R, CM 96  BUN 18 16 19 15 25  High  20 R 16  Creatinine, Ser 0.93 0.88 0.82 0.89 0.93 0.85 R, CM 0.81  Total Bilirubin 0.4 0.4 0.4 0.4 0.5 0.3 0.4  Alkaline Phosphatase 40 45 43 41 44  50  AST 16 21 17 21 18 16  R 15  ALT 12 18 13 18 13 13  R 11  Total Protein 6.6 6.4 6.4 6.5 7.0 6.8 R 6.3  Albumin 4.2  4.1 4.2 4.2 4.3  4.2  GFR 58.04 Low  62.24 CM 68.00 CM 61.85 CM 58.87 Low  CM  68.46  Comment: Calculated using the CKD-EPI Creatinine Equation (2021)  Calcium 9.5 9.2 9.3 9.7 9.8 10.0 R 9.4  Resulting Agency [place] [facility] [place] [facility] [place] [facility] [place] [facility] [place] [facility]        Specimen Collected: 11/26/22 10:15 Last Resulted: 11/26/22 14:21    EKG   Radiology No results found.  Procedures Procedures (including critical care time)  Medications Ordered in UC Medications - No data to display  Initial Impression / Assessment and Plan / UC Course  I have reviewed the triage vital signs and the nursing notes.  Pertinent labs & imaging results that were available during my care of the patient were reviewed by me and considered in my medical decision making (see chart for details).     Reviewed exam and symptoms with patient.  No red flags.  Discussed insect bite.  Concern for developing cellulitis, will start Keflex daily for 5 days.  Patient with history of chronic kidney  disease, labs reviewed and creatinine clearance 53.  She may continue with topical triamcinolone as needed for itching.  Encouraged to avoid scratching.  PCP follow-up as symptoms do not improve.  ER precautions reviewed Final Clinical Impressions(s) / UC Diagnoses   Final diagnoses:  Insect bite of left upper extremity, initial encounter     Discharge Instructions      Start Keflex 3 times a day for 5 days.  You may continue topical triamcinolone steroid cream as needed.  Try to avoid scratching the area.  Follow-up with your PCP if your symptoms do not improve.  Please go to the ER for any worsening symptoms.  Hope you feel better soon!    ED Prescriptions     Medication Sig Dispense Auth. Provider   cephALEXin (KEFLEX) 500 MG capsule Take 1 capsule (500 mg total) by mouth 3 (three) times daily for 5 days. 15 capsule Radford Pax, NP      PDMP not reviewed this encounter.   Radford Pax, NP 07/17/23 [phone]

## 2023-07-17 NOTE — Discharge Instructions (Signed)
Start Keflex 3 times a day for 5 days.  You may continue topical triamcinolone steroid cream as needed.  Try to avoid scratching the area.  Follow-up with your PCP if your symptoms do not improve.  Please go to the ER for any worsening symptoms.  Hope you feel better soon!

## 2023-07-27 ENCOUNTER — Other Ambulatory Visit: Payer: Self-pay | Admitting: Family Medicine

## 2023-08-28 ENCOUNTER — Ambulatory Visit: Payer: Medicare Other | Admitting: Family Medicine

## 2023-08-30 NOTE — Progress Notes (Addendum)
 [facility] [address] [place], [place] [address] [phone]  Date:  09/03/2023   Name:  Laura [name]   DOB:  [birth year]   MRN:  [id number]  PCP:  Domenica Harlene DELENA, MD    Chief Complaint: 6 month follow up (Concerns/ questions: none/AWV overdue/DM urine due)   History of Present Illness:  Laura [name] is a 83 y.o. very pleasant female patient who presents with the following:  Primary patient of my partner Dr. Domenica who is here today for periodic follow-up-I have not seen her myself previously It looks like she last saw Dr. Domenica in April History of stage III chronic kidney disease, hypertension, hyperlipidemia, prediabetes Per PCP last note:  Hyperglycemia: This is well-controlled and patient's HGBA1C will be rechecked today. Hyperlipidemia: This is well-controlled with Rosuvastatin  20 mg and continues to be monitored. Hypertension: This is well-controlled with Hydrochlorothiazide  12.5 mg and Losartan  25 mg and continues to be monitored.  Immunizations: Encouraged patient to consider annual COVID-19 and Influenza vaccinations as well as RSV vaccination.   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 She is from [place] originally  Mammo done on Monday - she is waiting on the result She does not wish to have bone density scanning Colon 2017  Lab Results  Component Value Date   HGBA1C 6.0 11/26/2022     Patient Active Problem List   Diagnosis Date Noted   History of COVID-19 11/26/2022   Congestion of throat 10/04/2022   Arthritis of left hand 11/08/2021   Diarrhea 07/10/2021   Bronchitis 06/29/2021   Impacted cerumen of right ear 04/27/2021   Left hip pain 05/22/2020   Hyperglycemia 05/22/2020   Premature ventricular contractions (PVCs)  (VPCs) 09/14/2019   Nonspecific abnormal electrocardiogram (ECG) (EKG) 09/14/2019   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/13/2019   Educated about COVID-19 virus infection 12/10/2018   Occipital neuralgia 08/07/2017   Constipation 08/07/2017   Spondylosis of cervical joint 05/15/2017   Left foot pain 05/01/2017   Obesity (BMI 30-39.9) 02/21/2017   CKD (chronic kidney disease), stage III (HCC) 02/21/2017   Chest pain 02/20/2017   Hypertension 02/20/2017   HLD (hyperlipidemia) 02/20/2017   Neck pain 01/22/2016   History of colonic polyps 07/15/2015   Preventative health care 02/01/2015   Fibrocystic breast 05/01/2014   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   Decreased visual acuity 01/23/2014   Sun-damaged skin 01/23/2014   Recurrent BCC (basal cell carcinoma) 10/13/2013   Cancer (HCC)    Cataract     Past Medical History:  Diagnosis Date   Adenomatous colon polyp 03/2012   Cancer (HCC)    basal on back and nose   Cataract    Chicken pox as a child   CKD (chronic kidney disease), stage III (HCC) 02/21/2017   DDD (degenerative disc disease), cervical    EBV infection    mono as child   Fibrocystic breast 05/01/2014   GERD (gastroesophageal reflux disease)    pt denies   Hyperlipidemia    Hypertension 20 yrs ago   Measles as a child   Medicare annual wellness visit, subsequent 02/01/2015   Mumps as a child   Neck pain 01/22/2016   Other and unspecified hyperlipidemia 10/13/2013   Overweight(278.02)    PONV (postoperative nausea and vomiting)  pt would like something to prevent nausea   Vasovagal reaction 07/15/2015   no syncope but close     Past Surgical History:  Procedure Laterality Date   ABDOMINAL HYSTERECTOMY  29 yrs ago   heavy bleeding, ovaries left in place   ANTERIOR CERVICAL DECOMP/DISCECTOMY FUSION N/A 05/15/2017   Procedure: ANTERIOR CERVICAL DECOMPRESSION/DISCECTOMY FUSION CERVICL THREE CERVICAL FOUR, CERVICAL FOUR- CERVICAL FIVE;  Surgeon: Gillie Duncans, MD;  Location: [facility]  OR;  Service: Neurosurgery;  Laterality: N/A;   CATARACT EXTRACTION, BILATERAL Bilateral 2012   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  Exercise 4:30 min (4.6 METS)-she reached peak heart rate 146 bpm which is 96% max protected.  No EKG changes.  Normal wall motion. No evidence of ischemia or infarction.    POLYPECTOMY     TRANSTHORACIC ECHOCARDIOGRAM  01/2017    EF 60 to 65%.  GR 1 DD.  Normal wall motion.  Normal atrial sizes.  Mildly dilated IVC.   VAGINAL HYSTERECTOMY  83 yrs old   uterus removed    Social History   Tobacco Use   Smoking status: Former    Current packs/day: 1.00    Average packs/day: 1 pack/day for 51.1 years (51.1 ttl pk-yrs)    Types: Cigarettes    Start date: 07/29/1972   Smokeless tobacco: Never  Substance Use Topics   Alcohol use: No    Alcohol/week: 0.0 standard drinks of alcohol   Drug use: No    Family History  Problem Relation Age of Onset   Heart disease Father    Heart attack Father 16   Hypertension Father    Dementia Maternal Grandmother    Heart disease Maternal Grandfather    Hyperlipidemia Paternal Grandmother    Cancer Paternal Grandfather        stomach   Heart attack Paternal Grandfather    Stomach cancer Paternal Grandfather    Dementia Mother    Pulmonary embolism Son    Vasculitis Son    Other Son        vasculitis led to PE   Colon cancer Neg Hx    Colon polyps Neg Hx    Rectal cancer Neg Hx     Allergies  Allergen Reactions   Omnicef  [Cefdinir ] Nausea And Vomiting   Dust Mite Extract    Lisinopril Other (See Comments)    dizzy   Statins Other (See Comments)    Muscle weakness - atorvastatin      Sulfa Antibiotics Other (See Comments)    feels feverish   Tetracyclines & Related Other (See Comments)    GI upset   Benadryl [Diphenhydramine] Other (See Comments)    Urinary incontinence stopped when med  discontinued    Medication list has been reviewed and updated.  Current Outpatient Medications on File Prior to Visit  Medication Sig Dispense Refill   acetaminophen  (TYLENOL ) 500 MG tablet Take 500 mg by mouth 2 (two) times daily as needed for mild pain.      fluticasone  (FLONASE ) 50 MCG/ACT nasal spray Place 2 sprays into both nostrils daily. 16 g 6   hydrochlorothiazide  (HYDRODIURIL ) 12.5 MG tablet TAKE 1 TABLET BY MOUTH EVERY DAY IN THE MORNING/ appt for refills 90 tablet 0   losartan  (COZAAR ) 25 MG tablet Take 1 tablet (25 mg total) by mouth daily. 90 tablet 1   Multiple  Vitamin (MULTIVITAMIN) tablet Take 1 tablet by mouth daily.     naproxen  (NAPROSYN ) 500 MG tablet Take 500 mg by mouth daily as needed.  1   rosuvastatin  (CRESTOR ) 20 MG tablet Take 1 tablet (20 mg total) by mouth daily. Appt for refills 90 tablet 0   No current facility-administered medications on file prior to visit.    Review of Systems:  As per HPI- otherwise negative.   Physical Examination: Vitals:   09/03/23 1008  BP: 138/70  Pulse: 77  Resp: 18  Temp: 98.3 F (36.8 C)  SpO2: 98%   Vitals:   09/03/23 1008  Weight: 159 lb (72.1 kg)  Height: 5' 4 (1.626 m)   Body mass index is 27.29 kg/m. Ideal Body Weight: Weight in (lb) to have BMI = 25: 145.3  GEN: no acute distress.  Looks great for age  HEENT: Atraumatic, Normocephalic.  Bilateral TM wnl, oropharynx normal.  PEERL,EOMI.   Ears and Nose: No external deformity. CV: RRR, No M/G/R. No JVD. No thrill. No extra heart sounds. PULM: CTA B, no wheezes, crackles, rhonchi. No retractions. No resp. distress. No accessory muscle use. ABD: S, NT, ND, +BS. No rebound. No HSM. EXTR: No c/c/e PSYCH: Normally interactive. Conversant.    Assessment and Plan: Mixed hyperlipidemia - Plan: rosuvastatin  (CRESTOR ) 20 MG tablet, Lipid panel  Primary hypertension - Plan: hydrochlorothiazide  (HYDRODIURIL ) 12.5 MG tablet, losartan  (COZAAR ) 25 MG tablet,  CBC, Comprehensive metabolic panel  Vitamin D  deficiency - Plan: VITAMIN D  25 Hydroxy (Vit-D Deficiency, Fractures)  Pre-diabetes - Plan: Hemoglobin A1c  Thyroid  disorder screening - Plan: TSH  Patient seen today for periodic follow-up.  She is doing excellent for age 16 Routine blood work pending as above Will plan further follow- up pending labs. Blood pressure well-controlled on current regimen Signed Harlene Schroeder, MD  Received labs as below, message to patient Results for orders placed or performed in visit on 09/03/23  CBC   Collection Time: 09/03/23 10:46 AM  Result Value Ref Range   WBC 4.7 4.0 - 10.5 K/uL   RBC 4.49 3.87 - 5.11 Mil/uL   Platelets 221.0 150.0 - 400.0 K/uL   Hemoglobin 13.6 12.0 - 15.0 g/dL   HCT 59.2 63.9 - 53.9 %   MCV 90.5 78.0 - 100.0 fl   MCHC 33.4 30.0 - 36.0 g/dL   RDW 86.4 88.4 - 84.4 %  Comprehensive metabolic panel   Collection Time: 09/03/23 10:46 AM  Result Value Ref Range   Sodium 140 135 - 145 mEq/L   Potassium 4.0 3.5 - 5.1 mEq/L   Chloride 101 96 - 112 mEq/L   CO2 31 19 - 32 mEq/L   Glucose, Bld 88 70 - 99 mg/dL   BUN 21 6 - 23 mg/dL   Creatinine, Ser 9.13 0.40 - 1.20 mg/dL   Total Bilirubin 0.5 0.2 - 1.2 mg/dL   Alkaline Phosphatase 41 39 - 117 U/L   AST 21 0 - 37 U/L   ALT 15 0 - 35 U/L   Total Protein 6.6 6.0 - 8.3 g/dL   Albumin 4.4 3.5 - 5.2 g/dL   GFR 36.58 >39.99 mL/min   Calcium  9.7 8.4 - 10.5 mg/dL  Hemoglobin J8r   Collection Time: 09/03/23 10:46 AM  Result Value Ref Range   Hgb A1c MFr Bld 6.1 4.6 - 6.5 %  Lipid panel   Collection Time: 09/03/23 10:46 AM  Result Value Ref Range   Cholesterol 152 0 - 200 mg/dL  Triglycerides 130.0 0.0 - 149.0 mg/dL   HDL 44.09 >60.99 mg/dL   VLDL 73.9 0.0 - 59.9 mg/dL   LDL Cholesterol 70 0 - 99 mg/dL   Total CHOL/HDL Ratio 3    NonHDL 96.45   TSH   Collection Time: 09/03/23 10:46 AM  Result Value Ref Range   TSH 2.78 0.35 - 5.50 uIU/mL  VITAMIN D  25 Hydroxy (Vit-D  Deficiency, Fractures)   Collection Time: 09/03/23 10:46 AM  Result Value Ref Range   VITD 35.18 30.00 - 100.00 ng/mL

## 2023-09-01 ENCOUNTER — Ambulatory Visit (HOSPITAL_BASED_OUTPATIENT_CLINIC_OR_DEPARTMENT_OTHER)
Admission: RE | Admit: 2023-09-01 | Discharge: 2023-09-01 | Disposition: A | Discharge status: Home / Self Care | Payer: Medicare Other | Source: Ambulatory Visit | Attending: Family Medicine | Admitting: Family Medicine

## 2023-09-01 ENCOUNTER — Other Ambulatory Visit (HOSPITAL_BASED_OUTPATIENT_CLINIC_OR_DEPARTMENT_OTHER): Payer: Self-pay | Admitting: Family Medicine

## 2023-09-01 ENCOUNTER — Encounter (HOSPITAL_BASED_OUTPATIENT_CLINIC_OR_DEPARTMENT_OTHER): Payer: Self-pay

## 2023-09-01 DIAGNOSIS — Z1231 Encounter for screening mammogram for malignant neoplasm of breast: Secondary | ICD-10-CM

## 2023-09-01 DIAGNOSIS — Z139 Encounter for screening, unspecified: Secondary | ICD-10-CM | POA: Insufficient documentation

## 2023-09-03 ENCOUNTER — Encounter: Payer: Self-pay | Admitting: Family Medicine

## 2023-09-03 ENCOUNTER — Telehealth: Payer: Self-pay | Admitting: Family Medicine

## 2023-09-03 ENCOUNTER — Ambulatory Visit (INDEPENDENT_AMBULATORY_CARE_PROVIDER_SITE_OTHER): Payer: Medicare Other | Admitting: Family Medicine

## 2023-09-03 VITALS — BP 138/70 | HR 77 | Temp 98.3°F | Resp 18 | Ht 64.0 in | Wt 159.0 lb

## 2023-09-03 DIAGNOSIS — E559 Vitamin D deficiency, unspecified: Secondary | ICD-10-CM

## 2023-09-03 DIAGNOSIS — E782 Mixed hyperlipidemia: Secondary | ICD-10-CM

## 2023-09-03 DIAGNOSIS — Z1329 Encounter for screening for other suspected endocrine disorder: Secondary | ICD-10-CM

## 2023-09-03 DIAGNOSIS — R7303 Prediabetes: Secondary | ICD-10-CM

## 2023-09-03 DIAGNOSIS — I10 Essential (primary) hypertension: Secondary | ICD-10-CM

## 2023-09-03 LAB — COMPREHENSIVE METABOLIC PANEL
ALT: 15 U/L (ref 0–35)
AST: 21 U/L (ref 0–37)
Albumin: 4.4 g/dL (ref 3.5–5.2)
Alkaline Phosphatase: 41 U/L (ref 39–117)
BUN: 21 mg/dL (ref 6–23)
CO2: 31 meq/L (ref 19–32)
Calcium: 9.7 mg/dL (ref 8.4–10.5)
Chloride: 101 meq/L (ref 96–112)
Creatinine, Ser: 0.86 mg/dL (ref 0.40–1.20)
GFR: 63.41 mL/min (ref >60.00)
Glucose, Bld: 88 mg/dL (ref 70–99)
Potassium: 4 meq/L (ref 3.5–5.1)
Sodium: 140 meq/L (ref 135–145)
Total Bilirubin: 0.5 mg/dL (ref 0.2–1.2)
Total Protein: 6.6 g/dL (ref 6.0–8.3)

## 2023-09-03 LAB — CBC
HCT: 40.7 % (ref 36.0–46.0)
Hemoglobin: 13.6 g/dL (ref 12.0–15.0)
MCHC: 33.4 g/dL (ref 30.0–36.0)
MCV: 90.5 fL (ref 78.0–100.0)
Platelets: 221 10*3/uL (ref 150.0–400.0)
RBC: 4.49 Mil/uL (ref 3.87–5.11)
RDW: 13.5 % (ref 11.5–15.5)
WBC: 4.7 10*3/uL (ref 4.0–10.5)

## 2023-09-03 LAB — LIPID PANEL
Cholesterol: 152 mg/dL (ref 0–200)
HDL: 55.9 mg/dL (ref >39.00)
LDL Cholesterol: 70 mg/dL (ref 0–99)
NonHDL: 96.45
Total CHOL/HDL Ratio: 3
Triglycerides: 130 mg/dL (ref 0.0–149.0)
VLDL: 26 mg/dL (ref 0.0–40.0)

## 2023-09-03 LAB — HEMOGLOBIN A1C: Hgb A1c MFr Bld: 6.1 % (ref 4.6–6.5)

## 2023-09-03 LAB — TSH: TSH: 2.78 u[IU]/mL (ref 0.35–5.50)

## 2023-09-03 LAB — VITAMIN D 25 HYDROXY (VIT D DEFICIENCY, FRACTURES): VITD: 35.18 ng/mL (ref 30.00–100.00)

## 2023-09-03 MED ORDER — LOSARTAN POTASSIUM 25 MG PO TABS: 25.0000 mg | ORAL_TABLET | Freq: Every day | ORAL | 3 refills | Status: DC

## 2023-09-03 MED ORDER — ROSUVASTATIN CALCIUM 20 MG PO TABS: 20.0000 mg | ORAL_TABLET | Freq: Every day | ORAL | 3 refills | Status: AC

## 2023-09-03 MED ORDER — HYDROCHLOROTHIAZIDE 12.5 MG PO TABS: ORAL_TABLET | ORAL | 3 refills | Status: AC

## 2023-09-03 NOTE — Telephone Encounter (Signed)
error 

## 2023-09-03 NOTE — Patient Instructions (Signed)
 It was great to see you today- I will be in touch with your labs Recommend RSV vaccine (one dose only) if not done already, covid if no booster in the last 6 months or so

## 2024-03-23 DIAGNOSIS — Z23 Encounter for immunization: Secondary | ICD-10-CM | POA: Diagnosis not present

## 2024-05-02 NOTE — Assessment & Plan Note (Signed)
 Encouraged DASH or MIND diet, decrease po intake and increase exercise as tolerated. Needs 7-8 hours of sleep nightly. Avoid trans fats, eat small, frequent meals every 4-5 hours with lean proteins, complex carbs and healthy fats. Minimize simple carbs, high fat foods and processed foods

## 2024-05-02 NOTE — Assessment & Plan Note (Signed)
 Tolerating statin, encouraged heart healthy diet, avoid trans fats, minimize simple carbs and saturated fats. Increase exercise as tolerated

## 2024-05-02 NOTE — Assessment & Plan Note (Signed)
 hgba1c acceptable, minimize simple carbs. Increase exercise as tolerated.

## 2024-05-02 NOTE — Assessment & Plan Note (Signed)
 Well controlled, no changes to meds. Encouraged heart healthy diet such as the DASH diet and exercise as tolerated.

## 2024-05-02 NOTE — Assessment & Plan Note (Signed)
 Hydrate and monitor

## 2024-05-03 DIAGNOSIS — Z85828 Personal history of other malignant neoplasm of skin: Secondary | ICD-10-CM | POA: Diagnosis not present

## 2024-05-03 DIAGNOSIS — L821 Other seborrheic keratosis: Secondary | ICD-10-CM | POA: Diagnosis not present

## 2024-05-03 DIAGNOSIS — L570 Actinic keratosis: Secondary | ICD-10-CM | POA: Diagnosis not present

## 2024-05-03 DIAGNOSIS — Z129 Encounter for screening for malignant neoplasm, site unspecified: Secondary | ICD-10-CM | POA: Diagnosis not present

## 2024-05-03 DIAGNOSIS — L820 Inflamed seborrheic keratosis: Secondary | ICD-10-CM | POA: Diagnosis not present

## 2024-05-06 ENCOUNTER — Encounter: Payer: Self-pay | Admitting: Family Medicine

## 2024-05-06 ENCOUNTER — Ambulatory Visit: Payer: Medicare Other | Admitting: Family Medicine

## 2024-05-06 VITALS — BP 122/70 | HR 68 | Temp 98.1°F | Ht 64.0 in

## 2024-05-06 DIAGNOSIS — E2839 Other primary ovarian failure: Secondary | ICD-10-CM | POA: Diagnosis not present

## 2024-05-06 DIAGNOSIS — N183 Chronic kidney disease, stage 3 unspecified: Secondary | ICD-10-CM

## 2024-05-06 DIAGNOSIS — I10 Essential (primary) hypertension: Secondary | ICD-10-CM | POA: Diagnosis not present

## 2024-05-06 DIAGNOSIS — E669 Obesity, unspecified: Secondary | ICD-10-CM

## 2024-05-06 DIAGNOSIS — Z9889 Other specified postprocedural states: Secondary | ICD-10-CM | POA: Diagnosis not present

## 2024-05-06 DIAGNOSIS — Z23 Encounter for immunization: Secondary | ICD-10-CM | POA: Diagnosis not present

## 2024-05-06 DIAGNOSIS — E782 Mixed hyperlipidemia: Secondary | ICD-10-CM | POA: Diagnosis not present

## 2024-05-06 DIAGNOSIS — R739 Hyperglycemia, unspecified: Secondary | ICD-10-CM | POA: Diagnosis not present

## 2024-05-06 DIAGNOSIS — Z85828 Personal history of other malignant neoplasm of skin: Secondary | ICD-10-CM

## 2024-05-06 DIAGNOSIS — R0989 Other specified symptoms and signs involving the circulatory and respiratory systems: Secondary | ICD-10-CM | POA: Diagnosis not present

## 2024-05-06 LAB — CBC WITH DIFFERENTIAL/PLATELET
Basophils Absolute: 0 K/uL (ref 0.0–0.1)
Basophils Relative: 0.5 % (ref 0.0–3.0)
Eosinophils Absolute: 0 K/uL (ref 0.0–0.7)
Eosinophils Relative: 0.7 % (ref 0.0–5.0)
HCT: 39.9 % (ref 36.0–46.0)
Hemoglobin: 13.2 g/dL (ref 12.0–15.0)
Lymphocytes Relative: 36 % (ref 12.0–46.0)
Lymphs Abs: 1.7 K/uL (ref 0.7–4.0)
MCHC: 33.1 g/dL (ref 30.0–36.0)
MCV: 89.3 fl (ref 78.0–100.0)
Monocytes Absolute: 0.6 K/uL (ref 0.1–1.0)
Monocytes Relative: 13.5 % — ABNORMAL HIGH (ref 3.0–12.0)
Neutro Abs: 2.3 K/uL (ref 1.4–7.7)
Neutrophils Relative %: 49.3 % (ref 43.0–77.0)
Platelets: 206 K/uL (ref 150.0–400.0)
RBC: 4.47 Mil/uL (ref 3.87–5.11)
RDW: 13.7 % (ref 11.5–15.5)
WBC: 4.8 K/uL (ref 4.0–10.5)

## 2024-05-06 LAB — LIPID PANEL
Cholesterol: 149 mg/dL (ref 0–200)
HDL: 52.9 mg/dL (ref >39.00)
LDL Cholesterol: 69 mg/dL (ref 0–99)
NonHDL: 95.7
Total CHOL/HDL Ratio: 3
Triglycerides: 136 mg/dL (ref 0.0–149.0)
VLDL: 27.2 mg/dL (ref 0.0–40.0)

## 2024-05-06 LAB — COMPREHENSIVE METABOLIC PANEL WITH GFR
ALT: 12 U/L (ref 0–35)
AST: 19 U/L (ref 0–37)
Albumin: 4.3 g/dL (ref 3.5–5.2)
Alkaline Phosphatase: 40 U/L (ref 39–117)
BUN: 17 mg/dL (ref 6–23)
CO2: 31 meq/L (ref 19–32)
Calcium: 9.3 mg/dL (ref 8.4–10.5)
Chloride: 103 meq/L (ref 96–112)
Creatinine, Ser: 0.86 mg/dL (ref 0.40–1.20)
GFR: 63.11 mL/min (ref >60.00)
Glucose, Bld: 85 mg/dL (ref 70–99)
Potassium: 3.9 meq/L (ref 3.5–5.1)
Sodium: 142 meq/L (ref 135–145)
Total Bilirubin: 0.4 mg/dL (ref 0.2–1.2)
Total Protein: 6.4 g/dL (ref 6.0–8.3)

## 2024-05-06 LAB — HEMOGLOBIN A1C: Hgb A1c MFr Bld: 6.2 % (ref 4.6–6.5)

## 2024-05-06 LAB — TSH: TSH: 2.44 u[IU]/mL (ref 0.35–5.50)

## 2024-05-06 NOTE — Progress Notes (Signed)
 Subjective:    Patient ID: Laura [name], female    DOB: [birth year], 83 y.o.   MRN: [id number]  Chief Complaint  Patient presents with   Follow-up    No concerns     HPI Discussed the use of AI scribe software for clinical note transcription with the patient, who gave verbal consent to proceed.  History of Present Illness Laura [name] is an 83 year old female who presents with fatigue.  She experiences increased fatigue, particularly in the afternoons, feeling the need to rest around 1 or 2 PM after completing her morning activities. No shortness of breath or other symptoms accompany the fatigue. A short nap of 10 to 20 minutes can be refreshing, but longer naps affect her ability to sleep at night.  She has a history of skin cancer, with basal cell carcinoma previously removed from her back and nose. Recently, precancerous lesions were removed from her right arm and nose. She is vigilant about monitoring her skin for new lesions and visits her dermatologist annually for a complete skin check.  Her blood pressure readings have been stable, with a recent measurement of 122/70 mmHg. Previous readings include 138/70 mmHg and 128/66 mmHg. She has not had any recent emergency room visits.  She received a flu shot at [facility] approximately six to eight weeks ago, which made her feel achy and flu-like for about a week. She has had the pneumonia vaccine in the past.  She does not take vitamin D  supplements due to constipation issues but ensures she gets vitamin D  through her diet, including cheese and limited sun exposure. Her vitamin D  levels were previously checked, with a result of 35 ng/mL, which is at the low end of normal.  She is currently on medications including rosuvastatin , losartan , and hydrochlorothiazide , with automatic refills set up for these prescriptions.    Past Medical History:  Diagnosis Date   Adenomatous colon polyp 03/2012   Cancer (HCC)    basal on back and  nose   Cataract    Chicken pox as a child   CKD (chronic kidney disease), stage III (HCC) 02/21/2017   DDD (degenerative disc disease), cervical    EBV infection    mono as child   Fibrocystic breast 05/01/2014   GERD (gastroesophageal reflux disease)    pt denies   Hyperlipidemia    Hypertension 20 yrs ago   Measles as a child   Medicare annual wellness visit, subsequent 02/01/2015   Mumps as a child   Neck pain 01/22/2016   Other and unspecified hyperlipidemia 10/13/2013   Overweight(278.02)    PONV (postoperative nausea and vomiting)    pt would like something to prevent nausea   Vasovagal reaction 07/15/2015   no syncope but close     Past Surgical History:  Procedure Laterality Date   ABDOMINAL HYSTERECTOMY  29 yrs ago   heavy bleeding, ovaries left in place   ANTERIOR CERVICAL DECOMP/DISCECTOMY FUSION N/A 05/15/2017   Procedure: ANTERIOR CERVICAL DECOMPRESSION/DISCECTOMY FUSION CERVICL THREE CERVICAL FOUR, CERVICAL FOUR- CERVICAL FIVE;  Surgeon: Gillie Duncans, MD;  Location: [facility] OR;  Service: Neurosurgery;  Laterality: N/A;   CATARACT EXTRACTION, BILATERAL Bilateral 2012   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  Exercise 4:30 min (4.6 METS)-she reached peak heart rate 146 bpm which is 96% max protected.  No EKG changes.  Normal wall motion. No evidence of ischemia or infarction.    POLYPECTOMY     TRANSTHORACIC ECHOCARDIOGRAM  01/2017    EF 60 to 65%.  GR 1 DD.  Normal wall motion.  Normal atrial sizes.  Mildly dilated IVC.   VAGINAL HYSTERECTOMY  83 yrs old   uterus removed    Family History  Problem Relation Age of Onset   Heart disease Father    Heart attack Father 50   Hypertension Father    Dementia Maternal Grandmother    Heart disease Maternal Grandfather    Hyperlipidemia Paternal Grandmother    Cancer Paternal Grandfather        stomach   Heart attack  Paternal Grandfather    Stomach cancer Paternal Grandfather    Dementia Mother    Pulmonary embolism Son    Vasculitis Son    Other Son        vasculitis led to PE   Colon cancer Neg Hx    Colon polyps Neg Hx    Rectal cancer Neg Hx     Social History   Socioeconomic History   Marital status: Married    Spouse name: Not on file   Number of children: Not on file   Years of education: Not on file   Highest education level: Not on file  Occupational History   Not on file  Tobacco Use   Smoking status: Former    Current packs/day: 1.00    Average packs/day: 1 pack/day for 51.8 years (51.8 ttl pk-yrs)    Types: Cigarettes    Start date: 07/29/1972   Smokeless tobacco: Never  Substance and Sexual Activity   Alcohol use: No    Alcohol/week: 0.0 standard drinks of alcohol   Drug use: No   Sexual activity: Never    Comment: lives with husband no major dietary restrictions  Other Topics Concern   Not on file  Social History Narrative   Not on file   Social Drivers of Health   Financial Resource Strain: Not on file  Food Insecurity: Not on file  Transportation Needs: Not on file  Physical Activity: Not on file  Stress: Not on file  Social Connections: Not on file  Intimate Partner Violence: Not on file    Outpatient Medications Prior to Visit  Medication Sig Dispense Refill   acetaminophen  (TYLENOL ) 500 MG tablet Take 500 mg by mouth 2 (two) times daily as needed for mild pain.      fluticasone  (FLONASE ) 50 MCG/ACT nasal spray Place 2 sprays into both nostrils daily. 16 g 6   hydrochlorothiazide  (HYDRODIURIL ) 12.5 MG tablet TAKE 1 TABLET BY MOUTH EVERY DAY IN THE MORNING/ appt for refills 90 tablet 3   losartan  (COZAAR ) 25 MG tablet Take 1 tablet (25 mg total) by mouth daily. 90 tablet 3   Multiple Vitamin (MULTIVITAMIN) tablet Take 1 tablet by mouth daily.     rosuvastatin  (CRESTOR ) 20 MG tablet Take 1 tablet (20 mg total) by mouth daily. Appt for refills 90 tablet 3    naproxen  (NAPROSYN ) 500 MG tablet Take 500 mg by mouth daily as needed. (Patient not taking: Reported on 05/06/2024)  1   No facility-administered medications prior to visit.    Allergies  Allergen Reactions   Omnicef  [Cefdinir ] Nausea And Vomiting   Dust Mite Extract    Lisinopril Other (See Comments)    dizzy   Statins Other (See Comments)    Muscle weakness - atorvastatin   Sulfa Antibiotics Other (See Comments)    feels feverish   Tetracyclines & Related Other (See Comments)    GI upset   Benadryl [Diphenhydramine] Other (See Comments)    Urinary incontinence stopped when med discontinued    Review of Systems  Constitutional:  Positive for malaise/fatigue. Negative for fever.  HENT:  Negative for congestion.   Eyes:  Negative for blurred vision.  Respiratory:  Negative for shortness of breath.   Cardiovascular:  Negative for chest pain, palpitations and leg swelling.  Gastrointestinal:  Negative for abdominal pain, blood in stool and nausea.  Genitourinary:  Negative for dysuria and frequency.  Musculoskeletal:  Negative for falls.  Skin:  Negative for rash.  Neurological:  Negative for dizziness, loss of consciousness and headaches.  Endo/Heme/Allergies:  Negative for environmental allergies.  Psychiatric/Behavioral:  Negative for depression. The patient is not nervous/anxious.        Objective:    Physical Exam Constitutional:      General: She is not in acute distress.    Appearance: Normal appearance. She is well-developed. She is not toxic-appearing.  HENT:     Head: Normocephalic and atraumatic.     Right Ear: External ear normal.     Left Ear: External ear normal.     Nose: Nose normal.  Eyes:     General:        Right eye: No discharge.        Left eye: No discharge.     Conjunctiva/sclera: Conjunctivae normal.  Neck:     Thyroid : No thyromegaly.  Cardiovascular:     Rate and Rhythm: Normal rate and regular rhythm.     Heart sounds: Normal  heart sounds. No murmur heard. Pulmonary:     Effort: Pulmonary effort is normal. No respiratory distress.     Breath sounds: Normal breath sounds.  Abdominal:     General: Bowel sounds are normal.     Palpations: Abdomen is soft.     Tenderness: There is no abdominal tenderness. There is no guarding.  Musculoskeletal:        General: Normal range of motion.     Cervical back: Neck supple.  Lymphadenopathy:     Cervical: No cervical adenopathy.  Skin:    General: Skin is warm and dry.  Neurological:     Mental Status: She is alert and oriented to person, place, and time.  Psychiatric:        Mood and Affect: Mood normal.        Behavior: Behavior normal.        Thought Content: Thought content normal.        Judgment: Judgment normal.     BP 122/70 (BP Location: Left Arm, Patient Position: Sitting, Cuff Size: Normal)   Pulse 68   Temp 98.1 F (36.7 C) (Oral)   Ht 5' 4 (1.626 m)   SpO2 98%   BMI 27.29 kg/m  Wt Readings from Last 3 Encounters:  09/03/23 159 lb (72.1 kg)  01/06/23 155 lb (70.3 kg)  11/26/22 153 lb (69.4 kg)    Diabetic Foot Exam - Simple   No data filed    Lab Results  Component Value Date   WBC 4.7 09/03/2023   HGB 13.6 09/03/2023   HCT 40.7 09/03/2023   PLT 221.0 09/03/2023   GLUCOSE 88 09/03/2023   CHOL 152 09/03/2023   TRIG 130.0 09/03/2023   HDL 55.90 09/03/2023   LDLDIRECT 157.0 09/13/2019   LDLCALC 70 09/03/2023  ALT 15 09/03/2023   AST 21 09/03/2023   NA 140 09/03/2023   K 4.0 09/03/2023   CL 101 09/03/2023   CREATININE 0.86 09/03/2023   BUN 21 09/03/2023   CO2 31 09/03/2023   TSH 2.78 09/03/2023   HGBA1C 6.1 09/03/2023    Lab Results  Component Value Date   TSH 2.78 09/03/2023   Lab Results  Component Value Date   WBC 4.7 09/03/2023   HGB 13.6 09/03/2023   HCT 40.7 09/03/2023   MCV 90.5 09/03/2023   PLT 221.0 09/03/2023   Lab Results  Component Value Date   NA 140 09/03/2023   K 4.0 09/03/2023   CO2 31  09/03/2023   GLUCOSE 88 09/03/2023   BUN 21 09/03/2023   CREATININE 0.86 09/03/2023   BILITOT 0.5 09/03/2023   ALKPHOS 41 09/03/2023   AST 21 09/03/2023   ALT 15 09/03/2023   PROT 6.6 09/03/2023   ALBUMIN 4.4 09/03/2023   CALCIUM  9.7 09/03/2023   ANIONGAP 10 05/09/2017   GFR 63.41 09/03/2023   Lab Results  Component Value Date   CHOL 152 09/03/2023   Lab Results  Component Value Date   HDL 55.90 09/03/2023   Lab Results  Component Value Date   LDLCALC 70 09/03/2023   Lab Results  Component Value Date   TRIG 130.0 09/03/2023   Lab Results  Component Value Date   CHOLHDL 3 09/03/2023   Lab Results  Component Value Date   HGBA1C 6.1 09/03/2023       Assessment & Plan:  Stage 3 chronic kidney disease, unspecified whether stage 3a or 3b CKD (HCC) Assessment & Plan: Hydrate and monitor    Mixed hyperlipidemia Assessment & Plan: Tolerating statin, encouraged heart healthy diet, avoid trans fats, minimize simple carbs and saturated fats. Increase exercise as tolerated  Orders: -     Lipid panel  Hyperglycemia Assessment & Plan: hgba1c acceptable, minimize simple carbs. Increase exercise as tolerated.   Orders: -     Hemoglobin A1c  Primary hypertension Assessment & Plan: Well controlled, no changes to meds. Encouraged heart healthy diet such as the DASH diet and exercise as tolerated.    Orders: -     Comprehensive metabolic panel with GFR -     CBC with Differential/Platelet -     TSH  Obesity (BMI 30-39.9) Assessment & Plan: Encouraged DASH or MIND diet, decrease po intake and increase exercise as tolerated. Needs 7-8 hours of sleep nightly. Avoid trans fats, eat small, frequent meals every 4-5 hours with lean proteins, complex carbs and healthy fats. Minimize simple carbs, high fat foods and processed foods   H/O basal cell carcinoma excision Assessment & Plan: Has had a couple of recent precancerous lesion removed from right arm and  nose   Bilateral carotid bruits -     US  Carotid Bilateral; Future  Immunization due -     Pneumococcal conjugate vaccine 20-valent  Estrogen deficiency    Assessment and Plan Assessment & Plan Adult Wellness Visit Routine wellness visit for an 83 year old female. Discussed general health maintenance, including the importance of vaccinations and lifestyle modifications to maintain health and prevent illness. - Administer Prevnar 20 vaccine today. - Encourage regular physical activity, balanced diet, and stress management. - Discussed the importance of flu, COVID, and RSV vaccines.  Fatigue Experiencing fatigue, particularly in the afternoon. Discussed potential causes including age-related changes and the importance of ruling out medical causes such as anemia, thyroid  dysfunction, and kidney function. -  Order blood work to check for anemia, thyroid  function, kidney function, and blood sugar levels.  Carotid bruit Detected a light bruit on carotid auscultation. No symptoms of dizziness or lightheadedness reported. Family history of stroke noted. - Order carotid ultrasound to evaluate for carotid artery stenosis.  Essential hypertension Blood pressure is well-controlled at 122/70 mmHg. Previous readings have been higher, but current management appears effective. - Continue current antihypertensive regimen. - Monitor blood pressure regularly.  Mixed hyperlipidemia Cholesterol levels are managed with current medication regimen. - Continue current statin therapy.  Chronic kidney disease, stage 3 Chronic kidney disease stage 3, well-managed. - Monitor kidney function with routine blood work.  Obesity Discussed the importance of maintaining a healthy weight through diet and exercise. - Encourage regular physical activity and a balanced diet.  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 Regular dermatological follow-up in place. - Continue annual dermatology visits for skin checks. - Monitor for new lesions and report any changes.  Recording duration: 37 minutes     Harlene Horton, MD

## 2024-05-06 NOTE — Patient Instructions (Addendum)
 RSV vaccine, Respiratory Syncitial Virus Vaccine, Arexvy at pharmacy  Annual Covid booster  Fatigue If you have fatigue, you feel tired all the time and have a lack of energy or a lack of motivation. Fatigue may make it difficult to start or complete tasks because of exhaustion. Occasional or mild fatigue is often a normal response to activity or life. However, long-term (chronic) or extreme fatigue may be a symptom of a medical condition such as: Depression. Not having enough red blood cells or hemoglobin in the blood (anemia). A problem with a small gland located in the lower front part of the neck (thyroid  disorder). Rheumatologic conditions. These are problems related to the body's defense system (immune system). Infections, especially certain viral infections. Fatigue can also lead to negative health outcomes over time. Follow these instructions at home: Medicines Take over-the-counter and prescription medicines only as told by your health care provider. Take a multivitamin if told by your health care provider. Do not use herbal or dietary supplements unless they are approved by your health care provider. Eating and drinking  Avoid heavy meals in the evening. Eat a well-balanced diet, which includes lean proteins, whole grains, plenty of fruits and vegetables, and low-fat dairy products. Avoid eating or drinking too many products with caffeine in them. Avoid alcohol. Drink enough fluid to keep your urine pale yellow. Activity  Exercise regularly, as told by your health care provider. Use or practice techniques to help you relax, such as yoga, tai chi, meditation, or massage therapy. Lifestyle Change situations that cause you stress. Try to keep your work and personal schedules in balance. Do not use recreational or illegal drugs. General instructions Monitor your fatigue for any changes. Go to bed and get up at the same time every day. Avoid fatigue by pacing yourself during  the day and getting enough sleep at night. Maintain a healthy weight. Contact a health care provider if: Your fatigue does not get better. You have a fever. You suddenly lose or gain weight. You have headaches. You have trouble falling asleep or sleeping through the night. You feel angry, guilty, anxious, or sad. You have swelling in your legs or another part of your body. Get help right away if: You feel confused, feel like you might faint, or faint. Your vision is blurry or you have a severe headache. You have severe pain in your abdomen, your back, or the area between your waist and hips (pelvis). You have chest pain, shortness of breath, or an irregular or fast heartbeat. You are unable to urinate, or you urinate less than normal. You have abnormal bleeding from the rectum, nose, lungs, nipples, or, if you are female, the vagina. You vomit blood. You have thoughts about hurting yourself or others. These symptoms may be an emergency. Get help right away. Call 911. Do not wait to see if the symptoms will go away. Do not drive yourself to the hospital. Get help right away if you feel like you may hurt yourself or others, or have thoughts about taking your own life. Go to your nearest emergency room or: Call 911. Call the National Suicide Prevention Lifeline at 934-579-1483 or 988. This is open 24 hours a day. Text the Crisis Text Line at 701-188-9445. Summary If you have fatigue, you feel tired all the time and have a lack of energy or a lack of motivation. Fatigue may make it difficult to start or complete tasks because of exhaustion. Long-term (chronic) or extreme fatigue may be a  symptom of a medical condition. Exercise regularly, as told by your health care provider. Change situations that cause you stress. Try to keep your work and personal schedules in balance. This information is not intended to replace advice given to you by your health care provider. Make sure you discuss any  questions you have with your health care provider. Document Revised: 05/07/2021 Document Reviewed: 05/07/2021 Elsevier Patient Education  2024 [employer].

## 2024-05-06 NOTE — Assessment & Plan Note (Signed)
 Has had a couple of recent precancerous lesion removed from right arm and nose

## 2024-05-07 ENCOUNTER — Ambulatory Visit: Payer: Self-pay | Admitting: Family Medicine

## 2024-05-10 ENCOUNTER — Ambulatory Visit: Payer: Medicare Other | Admitting: Family Medicine

## 2024-05-10 ENCOUNTER — Encounter: Payer: Self-pay | Admitting: Family Medicine

## 2024-05-10 ENCOUNTER — Telehealth: Payer: Self-pay | Admitting: Family Medicine

## 2024-05-10 NOTE — Addendum Note (Signed)
 Addended by: Whittany Parish D on: 05/10/2024 03:02 PM   Modules accepted: Orders

## 2024-05-10 NOTE — Telephone Encounter (Signed)
 Copied from CRM [id number]. Topic: Referral - Request for Referral >> May 10, 2024  2:03 PM Suzen RAMAN wrote: Did the patient discuss referral with their provider in the last year? No (If No - schedule appointment) (If Yes - send message)  Appointment offered? No  Type of order/referral and detailed reason for visit:Ultrasound, plaque found on carotid artery on left side per provider  Preference of office, provider, location: [facility] [address]   If referral order, have you been seen by this specialty before? No (If Yes, this issue or another issue? When? Where?  Can we respond through MyChart? Yes

## 2024-05-10 NOTE — Telephone Encounter (Signed)
 See my chart message

## 2024-05-25 ENCOUNTER — Ambulatory Visit (HOSPITAL_BASED_OUTPATIENT_CLINIC_OR_DEPARTMENT_OTHER)
Admission: RE | Admit: 2024-05-25 | Discharge: 2024-05-25 | Disposition: A | Discharge status: Home / Self Care | Source: Ambulatory Visit | Attending: Family Medicine | Admitting: Family Medicine

## 2024-05-25 DIAGNOSIS — R0989 Other specified symptoms and signs involving the circulatory and respiratory systems: Secondary | ICD-10-CM | POA: Insufficient documentation

## 2024-07-19 ENCOUNTER — Other Ambulatory Visit (HOSPITAL_BASED_OUTPATIENT_CLINIC_OR_DEPARTMENT_OTHER): Payer: Self-pay | Admitting: Family Medicine

## 2024-07-19 DIAGNOSIS — Z1231 Encounter for screening mammogram for malignant neoplasm of breast: Secondary | ICD-10-CM

## 2024-08-25 ENCOUNTER — Other Ambulatory Visit: Payer: Self-pay | Admitting: Family Medicine

## 2024-08-25 DIAGNOSIS — I10 Essential (primary) hypertension: Secondary | ICD-10-CM

## 2024-09-02 ENCOUNTER — Encounter (HOSPITAL_BASED_OUTPATIENT_CLINIC_OR_DEPARTMENT_OTHER): Payer: Self-pay

## 2024-09-02 ENCOUNTER — Ambulatory Visit (HOSPITAL_BASED_OUTPATIENT_CLINIC_OR_DEPARTMENT_OTHER)
Admission: RE | Admit: 2024-09-02 | Discharge: 2024-09-02 | Disposition: A | Source: Ambulatory Visit | Attending: Family Medicine | Admitting: Family Medicine

## 2024-09-02 DIAGNOSIS — Z1231 Encounter for screening mammogram for malignant neoplasm of breast: Secondary | ICD-10-CM

## 2025-01-06 ENCOUNTER — Ambulatory Visit: Admitting: Family Medicine

## 2025-06-02 ENCOUNTER — Encounter: Admitting: Family Medicine
